# Patient Record
Sex: Male | Born: 1935 | Race: White | Hispanic: No | Marital: Married
Health system: Midwestern US, Academic
[De-identification: ages and names within clinical notes are randomized; demographics above are authoritative.]

## PROBLEM LIST (undated history)

## (undated) DIAGNOSIS — I4891 Unspecified atrial fibrillation: ICD-10-CM

---

## 2016-07-14 MED ORDER — AMIODARONE 200 MG PO TAB
ORAL_TABLET | ORAL | 3 refills | 42.00000 days | Status: DC
Start: 2016-07-14 — End: 2017-03-20

## 2016-11-14 LAB — BASIC METABOLIC PANEL
Lab: 1.1
Lab: 103
Lab: 110
Lab: 138
Lab: 14
Lab: 22
Lab: 26
Lab: 4.5
Lab: 58
Lab: 67
Lab: 9.6

## 2016-11-17 ENCOUNTER — Encounter: Admit: 2016-11-17 | Discharge: 2016-11-17 | Payer: MEDICARE

## 2016-11-17 MED ORDER — CARTIA XT 120 MG PO CP24
ORAL_CAPSULE | Freq: Every day | ORAL | 3 refills | 45.00000 days | Status: AC
Start: 2016-11-17 — End: 2017-03-20

## 2016-12-15 ENCOUNTER — Encounter: Admit: 2016-12-15 | Discharge: 2016-12-15 | Payer: MEDICARE

## 2016-12-15 DIAGNOSIS — I472 Ventricular tachycardia: ICD-10-CM

## 2016-12-15 DIAGNOSIS — Z79899 Other long term (current) drug therapy: ICD-10-CM

## 2016-12-15 DIAGNOSIS — I4891 Unspecified atrial fibrillation: Principal | ICD-10-CM

## 2016-12-15 NOTE — Progress Notes
Labs and/or CXR for amiodarone surveillance due per Amiodarone protocol.  Lab requisitions/orders and letter explaining need for testing mailed to patient or given to patient in clinic.

## 2017-01-05 ENCOUNTER — Encounter: Admit: 2017-01-05 | Discharge: 2017-01-05 | Payer: MEDICARE

## 2017-01-05 DIAGNOSIS — I472 Ventricular tachycardia: ICD-10-CM

## 2017-01-05 DIAGNOSIS — Z79899 Other long term (current) drug therapy: ICD-10-CM

## 2017-01-05 DIAGNOSIS — I4891 Unspecified atrial fibrillation: Principal | ICD-10-CM

## 2017-01-05 LAB — LIVER FUNCTION PANEL
Lab: 32
Lab: 7
Lab: 0.3
Lab: 0.6
Lab: 3.7
Lab: 30
Lab: 78

## 2017-01-05 LAB — TSH WITH FREE T4 REFLEX: Lab: 1.7

## 2017-01-05 NOTE — Progress Notes
CXR 01/02/17 at [facility] - There is mild pulmonary hyperinflation. There are degenerative changes of the thoracic spine with prominent bridging osteophytes. There is no acute appearing abnormality of the chest.

## 2017-01-05 NOTE — Progress Notes
Amiodarone Monitoring status as of 01/05/17:     Amiodarone monitoring complete.  Next amiodarone review is due in 180 days.    Most recent lab results  Lab Results   Component Value Date/Time    AST 30 01/02/2017    ALT 32 01/02/2017    TSH 1.72 01/02/2017    FREET4R 1.08 05/28/2015         Procedures  Last chest X-Ray: 10/5  Last PFT:   Last eye exam:

## 2017-01-07 ENCOUNTER — Encounter: Admit: 2017-01-07 | Discharge: 2017-01-07 | Payer: MEDICARE

## 2017-01-07 MED ORDER — ATORVASTATIN 20 MG PO TAB
ORAL_TABLET | Freq: Every day | 3 refills | Status: AC
Start: 2017-01-07 — End: 2018-06-30

## 2017-03-20 ENCOUNTER — Ambulatory Visit: Admit: 2017-03-20 | Discharge: 2017-03-21 | Payer: MEDICARE

## 2017-03-20 ENCOUNTER — Encounter: Admit: 2017-03-20 | Discharge: 2017-03-20 | Payer: MEDICARE

## 2017-03-20 DIAGNOSIS — I10 Essential (primary) hypertension: ICD-10-CM

## 2017-03-20 DIAGNOSIS — I358 Other nonrheumatic aortic valve disorders: ICD-10-CM

## 2017-03-20 DIAGNOSIS — I5030 Unspecified diastolic (congestive) heart failure: ICD-10-CM

## 2017-03-20 DIAGNOSIS — C4490 Unspecified malignant neoplasm of skin, unspecified: ICD-10-CM

## 2017-03-20 DIAGNOSIS — R600 Localized edema: ICD-10-CM

## 2017-03-20 DIAGNOSIS — I4891 Unspecified atrial fibrillation: Principal | ICD-10-CM

## 2017-03-20 DIAGNOSIS — E785 Hyperlipidemia, unspecified: ICD-10-CM

## 2017-03-20 DIAGNOSIS — I472 Ventricular tachycardia: ICD-10-CM

## 2017-03-20 DIAGNOSIS — E782 Mixed hyperlipidemia: ICD-10-CM

## 2017-03-20 DIAGNOSIS — R0989 Other specified symptoms and signs involving the circulatory and respiratory systems: ICD-10-CM

## 2017-03-20 DIAGNOSIS — I350 Nonrheumatic aortic (valve) stenosis: ICD-10-CM

## 2017-03-20 DIAGNOSIS — T887XXA Unspecified adverse effect of drug or medicament, initial encounter: ICD-10-CM

## 2017-03-20 DIAGNOSIS — J302 Other seasonal allergic rhinitis: ICD-10-CM

## 2017-03-20 DIAGNOSIS — R0600 Dyspnea, unspecified: ICD-10-CM

## 2017-03-20 MED ORDER — DILTIAZEM HCL 120 MG PO CP24
120 mg | ORAL_CAPSULE | Freq: Two times a day (BID) | ORAL | 3 refills | 45.00000 days | Status: AC
Start: 2017-03-20 — End: 2017-12-11

## 2017-03-20 MED ORDER — APIXABAN 5 MG PO TAB
5 mg | ORAL_TABLET | Freq: Two times a day (BID) | ORAL | 3 refills | Status: AC
Start: 2017-03-20 — End: 2017-06-23

## 2017-03-20 MED ORDER — FUROSEMIDE 20 MG PO TAB
20 mg | ORAL_TABLET | Freq: Every morning | ORAL | 3 refills | 90.00000 days | Status: AC
Start: 2017-03-20 — End: 2017-03-25

## 2017-03-25 ENCOUNTER — Encounter: Admit: 2017-03-25 | Discharge: 2017-03-25 | Payer: MEDICARE

## 2017-03-25 MED ORDER — FUROSEMIDE 20 MG PO TAB
20 mg | ORAL_TABLET | Freq: Every morning | ORAL | 3 refills | 90.00000 days | Status: AC
Start: 2017-03-25 — End: 2017-12-17

## 2017-03-27 ENCOUNTER — Encounter: Admit: 2017-03-27 | Discharge: 2017-03-27 | Payer: MEDICARE

## 2017-03-27 NOTE — Progress Notes
Medicare (Primary) no pre cert needed.

## 2017-04-03 ENCOUNTER — Encounter: Admit: 2017-04-03 | Discharge: 2017-04-03 | Payer: MEDICARE

## 2017-04-06 ENCOUNTER — Ambulatory Visit: Admit: 2017-04-06 | Discharge: 2017-04-06 | Payer: MEDICARE

## 2017-04-06 ENCOUNTER — Encounter: Admit: 2017-04-06 | Discharge: 2017-04-06 | Payer: MEDICARE

## 2017-04-06 ENCOUNTER — Encounter: Admit: 2017-04-06 | Discharge: 2017-04-07 | Payer: MEDICARE

## 2017-04-06 DIAGNOSIS — I4891 Unspecified atrial fibrillation: Principal | ICD-10-CM

## 2017-04-06 DIAGNOSIS — I5030 Unspecified diastolic (congestive) heart failure: ICD-10-CM

## 2017-04-06 DIAGNOSIS — Z5181 Encounter for therapeutic drug level monitoring: Principal | ICD-10-CM

## 2017-04-06 LAB — CBC
Lab: 10 FL (ref 7–11)
Lab: 13 g/dL — ABNORMAL LOW (ref 13.5–16.5)
Lab: 209 K/UL — ABNORMAL LOW (ref >60)
Lab: 29 pg — ABNORMAL HIGH (ref 26–34)
Lab: 32 g/dL (ref 32.0–36.0)
Lab: 4.4 M/UL (ref 4.4–5.5)
Lab: 40 % — ABNORMAL HIGH (ref 40–50)
Lab: 8.4 K/UL (ref 4.5–11.0)
Lab: 90 FL — ABNORMAL HIGH (ref 80–100)

## 2017-04-06 LAB — BASIC METABOLIC PANEL
Lab: 137 MMOL/L (ref 137–147)
Lab: 4.1 MMOL/L (ref 3.5–5.1)

## 2017-04-06 MED ORDER — PROPOFOL 10 MG/ML IV EMUL 20 ML (INFUSION)(AM)(OR)
INTRAVENOUS | 0 refills | Status: DC
Start: 2017-04-06 — End: 2017-04-06
  Administered 2017-04-06: 19:00:00 100 ug/kg/min via INTRAVENOUS

## 2017-04-06 MED ORDER — SODIUM CHLORIDE 0.9 % IV SOLP (OR) 500ML
0 refills | Status: DC
Start: 2017-04-06 — End: 2017-04-06
  Administered 2017-04-06: 19:00:00 via INTRAVENOUS

## 2017-04-06 MED ORDER — PROPOFOL INJ 10 MG/ML IV VIAL
0 refills | Status: DC
Start: 2017-04-06 — End: 2017-04-06
  Administered 2017-04-06: 19:00:00 50 mg via INTRAVENOUS
  Administered 2017-04-06 (×2): 20 mg via INTRAVENOUS

## 2017-04-06 MED ORDER — LIDOCAINE (PF) 200 MG/10 ML (2 %) IJ SYRG
0 refills | Status: DC
Start: 2017-04-06 — End: 2017-04-06
  Administered 2017-04-06: 19:00:00 60 mg via INTRAVENOUS

## 2017-04-30 ENCOUNTER — Encounter: Admit: 2017-04-30 | Discharge: 2017-04-30 | Payer: MEDICARE

## 2017-04-30 DIAGNOSIS — Z5181 Encounter for therapeutic drug level monitoring: Principal | ICD-10-CM

## 2017-04-30 DIAGNOSIS — I5030 Unspecified diastolic (congestive) heart failure: ICD-10-CM

## 2017-04-30 LAB — BASIC METABOLIC PANEL
Lab: 1.3 — ABNORMAL HIGH (ref 0.72–1.25)
Lab: 124 — ABNORMAL HIGH (ref 83–110)
Lab: 9.6
Lab: 102
Lab: 139
Lab: 14
Lab: 26 — ABNORMAL HIGH (ref 8.4–25.7)
Lab: 28
Lab: 4.5

## 2017-05-02 LAB — AMIODARONE LEVEL: Lab: 0.3 — ABNORMAL LOW (ref 1.0–2.5)

## 2017-05-05 ENCOUNTER — Ambulatory Visit: Admit: 2017-05-05 | Discharge: 2017-05-06 | Payer: MEDICARE

## 2017-05-05 ENCOUNTER — Encounter: Admit: 2017-05-05 | Discharge: 2017-05-05 | Payer: MEDICARE

## 2017-05-05 DIAGNOSIS — I358 Other nonrheumatic aortic valve disorders: ICD-10-CM

## 2017-05-05 DIAGNOSIS — I472 Ventricular tachycardia: ICD-10-CM

## 2017-05-05 DIAGNOSIS — C4490 Unspecified malignant neoplasm of skin, unspecified: ICD-10-CM

## 2017-05-05 DIAGNOSIS — I350 Nonrheumatic aortic (valve) stenosis: ICD-10-CM

## 2017-05-05 DIAGNOSIS — E785 Hyperlipidemia, unspecified: ICD-10-CM

## 2017-05-05 DIAGNOSIS — I10 Essential (primary) hypertension: Principal | ICD-10-CM

## 2017-05-05 DIAGNOSIS — J302 Other seasonal allergic rhinitis: ICD-10-CM

## 2017-05-05 DIAGNOSIS — R0989 Other specified symptoms and signs involving the circulatory and respiratory systems: ICD-10-CM

## 2017-05-05 DIAGNOSIS — R600 Localized edema: ICD-10-CM

## 2017-05-05 MED ORDER — CANDESARTAN-HYDROCHLOROTHIAZID 32-25 MG PO TAB
1 | ORAL_TABLET | Freq: Every day | ORAL | 3 refills | Status: AC
Start: 2017-05-05 — End: 2017-12-17

## 2017-05-05 MED ORDER — RIVAROXABAN 15 MG PO TAB
15 mg | ORAL_TABLET | Freq: Every day | ORAL | 11 refills | 30.00000 days | Status: AC
Start: 2017-05-05 — End: 2018-05-28

## 2017-05-07 ENCOUNTER — Encounter: Admit: 2017-05-07 | Discharge: 2017-05-07 | Payer: MEDICARE

## 2017-05-07 DIAGNOSIS — Z5181 Encounter for therapeutic drug level monitoring: Principal | ICD-10-CM

## 2017-05-07 DIAGNOSIS — I5030 Unspecified diastolic (congestive) heart failure: ICD-10-CM

## 2017-05-11 ENCOUNTER — Ambulatory Visit: Admit: 2017-05-11 | Discharge: 2017-05-12 | Payer: MEDICARE

## 2017-05-11 DIAGNOSIS — I10 Essential (primary) hypertension: Principal | ICD-10-CM

## 2017-05-11 DIAGNOSIS — I472 Ventricular tachycardia: ICD-10-CM

## 2017-05-11 DIAGNOSIS — I350 Nonrheumatic aortic (valve) stenosis: ICD-10-CM

## 2017-05-11 MED ORDER — NITROGLYCERIN 0.4 MG SL SUBL
.4 mg | SUBLINGUAL | 0 refills | Status: AC | PRN
Start: 2017-05-11 — End: ?

## 2017-05-11 MED ORDER — SODIUM CHLORIDE 0.9 % IV SOLP
250 mL | INTRAVENOUS | 0 refills | Status: AC | PRN
Start: 2017-05-11 — End: ?

## 2017-05-11 MED ORDER — REGADENOSON 0.4 MG/5 ML IV SYRG
.4 mg | Freq: Once | INTRAVENOUS | 0 refills | Status: CP
Start: 2017-05-11 — End: ?

## 2017-05-11 MED ORDER — AMINOPHYLLINE 500 MG/20 ML IV SOLN
50 mg | INTRAVENOUS | 0 refills | Status: AC | PRN
Start: 2017-05-11 — End: ?

## 2017-05-11 MED ORDER — ALBUTEROL SULFATE 90 MCG/ACTUATION IN HFAA
2 | RESPIRATORY_TRACT | 0 refills | Status: DC | PRN
Start: 2017-05-11 — End: 2017-05-16

## 2017-05-22 ENCOUNTER — Encounter: Admit: 2017-05-22 | Discharge: 2017-05-22 | Payer: MEDICARE

## 2017-06-08 ENCOUNTER — Ambulatory Visit: Admit: 2017-06-08 | Discharge: 2017-06-09 | Payer: MEDICARE

## 2017-06-08 DIAGNOSIS — I10 Essential (primary) hypertension: Principal | ICD-10-CM

## 2017-06-08 DIAGNOSIS — I358 Other nonrheumatic aortic valve disorders: ICD-10-CM

## 2017-06-08 DIAGNOSIS — I350 Nonrheumatic aortic (valve) stenosis: ICD-10-CM

## 2017-06-08 DIAGNOSIS — I472 Ventricular tachycardia: ICD-10-CM

## 2017-06-08 DIAGNOSIS — R0989 Other specified symptoms and signs involving the circulatory and respiratory systems: ICD-10-CM

## 2017-06-08 DIAGNOSIS — R600 Localized edema: ICD-10-CM

## 2017-06-08 DIAGNOSIS — J302 Other seasonal allergic rhinitis: ICD-10-CM

## 2017-06-08 DIAGNOSIS — C4490 Unspecified malignant neoplasm of skin, unspecified: ICD-10-CM

## 2017-06-09 ENCOUNTER — Encounter: Admit: 2017-06-09 | Discharge: 2017-06-09 | Payer: MEDICARE

## 2017-06-18 ENCOUNTER — Encounter: Admit: 2017-06-18 | Discharge: 2017-06-18 | Payer: MEDICARE

## 2017-06-23 ENCOUNTER — Encounter: Admit: 2017-06-23 | Discharge: 2017-06-23 | Payer: MEDICARE

## 2017-06-23 ENCOUNTER — Ambulatory Visit: Admit: 2017-06-23 | Discharge: 2017-06-24 | Payer: MEDICARE

## 2017-06-23 DIAGNOSIS — J302 Other seasonal allergic rhinitis: ICD-10-CM

## 2017-06-23 DIAGNOSIS — I358 Other nonrheumatic aortic valve disorders: ICD-10-CM

## 2017-06-23 DIAGNOSIS — R0989 Other specified symptoms and signs involving the circulatory and respiratory systems: ICD-10-CM

## 2017-06-23 DIAGNOSIS — E782 Mixed hyperlipidemia: ICD-10-CM

## 2017-06-23 DIAGNOSIS — I4891 Unspecified atrial fibrillation: ICD-10-CM

## 2017-06-23 DIAGNOSIS — I10 Essential (primary) hypertension: Principal | ICD-10-CM

## 2017-06-23 DIAGNOSIS — I350 Nonrheumatic aortic (valve) stenosis: Principal | ICD-10-CM

## 2017-06-23 DIAGNOSIS — I472 Ventricular tachycardia: ICD-10-CM

## 2017-06-23 DIAGNOSIS — C4490 Unspecified malignant neoplasm of skin, unspecified: ICD-10-CM

## 2017-06-23 DIAGNOSIS — I5030 Unspecified diastolic (congestive) heart failure: ICD-10-CM

## 2017-06-23 DIAGNOSIS — R0600 Dyspnea, unspecified: ICD-10-CM

## 2017-06-23 DIAGNOSIS — E785 Hyperlipidemia, unspecified: ICD-10-CM

## 2017-06-23 DIAGNOSIS — IMO0002 Overweight (BMI 25.0-29.9): ICD-10-CM

## 2017-08-03 ENCOUNTER — Ambulatory Visit: Admit: 2017-08-03 | Discharge: 2017-08-04 | Payer: MEDICARE

## 2017-08-03 DIAGNOSIS — I350 Nonrheumatic aortic (valve) stenosis: Principal | ICD-10-CM

## 2017-08-03 DIAGNOSIS — I5030 Unspecified diastolic (congestive) heart failure: ICD-10-CM

## 2017-08-03 DIAGNOSIS — I4891 Unspecified atrial fibrillation: ICD-10-CM

## 2017-08-13 ENCOUNTER — Encounter: Admit: 2017-08-13 | Discharge: 2017-08-13 | Payer: MEDICARE

## 2017-09-30 ENCOUNTER — Encounter: Admit: 2017-09-30 | Discharge: 2017-09-30 | Payer: MEDICARE

## 2017-09-30 DIAGNOSIS — I4891 Unspecified atrial fibrillation: ICD-10-CM

## 2017-09-30 DIAGNOSIS — Z79899 Other long term (current) drug therapy: ICD-10-CM

## 2017-09-30 DIAGNOSIS — E782 Mixed hyperlipidemia: Principal | ICD-10-CM

## 2017-12-02 ENCOUNTER — Encounter: Admit: 2017-12-02 | Discharge: 2017-12-02 | Payer: MEDICARE

## 2017-12-03 ENCOUNTER — Encounter: Admit: 2017-12-03 | Discharge: 2017-12-03 | Payer: MEDICARE

## 2017-12-11 ENCOUNTER — Encounter: Admit: 2017-12-11 | Discharge: 2017-12-11 | Payer: MEDICARE

## 2017-12-11 ENCOUNTER — Ambulatory Visit: Admit: 2017-12-11 | Discharge: 2017-12-11 | Payer: MEDICARE

## 2017-12-11 DIAGNOSIS — I472 Ventricular tachycardia: ICD-10-CM

## 2017-12-11 DIAGNOSIS — E782 Mixed hyperlipidemia: ICD-10-CM

## 2017-12-11 DIAGNOSIS — I358 Other nonrheumatic aortic valve disorders: ICD-10-CM

## 2017-12-11 DIAGNOSIS — C4490 Unspecified malignant neoplasm of skin, unspecified: ICD-10-CM

## 2017-12-11 DIAGNOSIS — E785 Hyperlipidemia, unspecified: ICD-10-CM

## 2017-12-11 DIAGNOSIS — R600 Localized edema: ICD-10-CM

## 2017-12-11 DIAGNOSIS — R002 Palpitations: ICD-10-CM

## 2017-12-11 DIAGNOSIS — IMO0002 Overweight (BMI 25.0-29.9): ICD-10-CM

## 2017-12-11 DIAGNOSIS — J302 Other seasonal allergic rhinitis: ICD-10-CM

## 2017-12-11 DIAGNOSIS — I10 Essential (primary) hypertension: ICD-10-CM

## 2017-12-11 DIAGNOSIS — T887XXA Unspecified adverse effect of drug or medicament, initial encounter: ICD-10-CM

## 2017-12-11 DIAGNOSIS — R0989 Other specified symptoms and signs involving the circulatory and respiratory systems: ICD-10-CM

## 2017-12-11 DIAGNOSIS — I5030 Unspecified diastolic (congestive) heart failure: ICD-10-CM

## 2017-12-11 DIAGNOSIS — I4891 Unspecified atrial fibrillation: Principal | ICD-10-CM

## 2017-12-11 DIAGNOSIS — I350 Nonrheumatic aortic (valve) stenosis: ICD-10-CM

## 2017-12-11 DIAGNOSIS — R0600 Dyspnea, unspecified: ICD-10-CM

## 2017-12-11 MED ORDER — CARVEDILOL 25 MG PO TAB
25 mg | ORAL_TABLET | Freq: Two times a day (BID) | ORAL | 11 refills | 90.00000 days | Status: AC
Start: 2017-12-11 — End: 2017-12-17

## 2017-12-12 ENCOUNTER — Inpatient Hospital Stay: Admit: 2017-12-12 | Discharge: 2017-12-12 | Payer: MEDICARE

## 2017-12-12 ENCOUNTER — Encounter: Admit: 2017-12-12 | Discharge: 2017-12-12 | Payer: MEDICARE

## 2017-12-12 DIAGNOSIS — R600 Localized edema: ICD-10-CM

## 2017-12-12 DIAGNOSIS — C4490 Unspecified malignant neoplasm of skin, unspecified: ICD-10-CM

## 2017-12-12 DIAGNOSIS — N184 Chronic kidney disease, stage 4 (severe): Secondary | ICD-10-CM

## 2017-12-12 DIAGNOSIS — I5030 Unspecified diastolic (congestive) heart failure: ICD-10-CM

## 2017-12-12 DIAGNOSIS — I10 Essential (primary) hypertension: Principal | ICD-10-CM

## 2017-12-12 DIAGNOSIS — R001 Bradycardia, unspecified: ICD-10-CM

## 2017-12-12 DIAGNOSIS — T887XXA Unspecified adverse effect of drug or medicament, initial encounter: ICD-10-CM

## 2017-12-12 DIAGNOSIS — J302 Other seasonal allergic rhinitis: ICD-10-CM

## 2017-12-12 DIAGNOSIS — I358 Other nonrheumatic aortic valve disorders: ICD-10-CM

## 2017-12-12 DIAGNOSIS — E785 Hyperlipidemia, unspecified: ICD-10-CM

## 2017-12-12 DIAGNOSIS — I4819 Other persistent atrial fibrillation: ICD-10-CM

## 2017-12-12 LAB — COMPREHENSIVE METABOLIC PANEL
Lab: 1.4 mg/dL — ABNORMAL HIGH (ref 0.3–1.2)
Lab: 112 U/L — ABNORMAL HIGH (ref 25–110)
Lab: 134 MMOL/L — ABNORMAL LOW (ref 137–147)
Lab: 135 U/L — ABNORMAL HIGH (ref 7–40)
Lab: 19 MMOL/L — ABNORMAL LOW (ref 21–30)
Lab: 2.3 mg/dL — ABNORMAL HIGH (ref 0.4–1.24)
Lab: 28 mL/min — ABNORMAL LOW (ref >60)
Lab: 33 mL/min — ABNORMAL LOW (ref >60)
Lab: 4 g/dL — ABNORMAL HIGH (ref 3.5–5.0)
Lab: 6.5 g/dL (ref 6.0–8.0)
Lab: 75 U/L — ABNORMAL HIGH (ref 7–56)
Lab: 8.3 mg/dL — ABNORMAL LOW (ref 8.5–10.6)
Lab: 9 K/UL — ABNORMAL HIGH (ref 3–12)

## 2017-12-12 LAB — CBC AND DIFF
Lab: 0 10*3/uL (ref 0–0.20)
Lab: 0 10*3/uL (ref 0–0.45)
Lab: 9.9 10*3/uL (ref 4.5–11.0)

## 2017-12-12 LAB — URINALYSIS MICROSCOPIC REFLEX TO CULTURE

## 2017-12-12 LAB — URINALYSIS DIPSTICK REFLEX TO CULTURE
Lab: NEGATIVE
Lab: NEGATIVE

## 2017-12-12 LAB — TROPONIN-I: Lab: 0 ng/mL — ABNORMAL HIGH (ref 0.0–0.05)

## 2017-12-12 LAB — TSH WITH FREE T4 REFLEX: Lab: 2.7 uU/mL — ABNORMAL HIGH (ref 0.35–5.00)

## 2017-12-12 LAB — MAGNESIUM: Lab: 1.7 mg/dL — ABNORMAL HIGH (ref 1.6–2.6)

## 2017-12-12 LAB — BNP (B-TYPE NATRIURETIC PEPTI): Lab: 435 pg/mL — ABNORMAL HIGH (ref 0–100)

## 2017-12-12 MED ORDER — DOPAMINE IN 5 % DEXTROSE 400 MG/250 ML (1,600 MCG/ML) IV SOLN
1-20 ug/kg/min | INTRAVENOUS | 0 refills | Status: DC
Start: 2017-12-12 — End: 2017-12-13
  Administered 2017-12-12: 22:00:00 20 ug/kg/min via INTRAVENOUS

## 2017-12-12 MED ORDER — IPRATROPIUM BROMIDE 0.02 % IN SOLN
0.5 mg | RESPIRATORY_TRACT | 0 refills | Status: DC | PRN
Start: 2017-12-12 — End: 2017-12-12

## 2017-12-12 MED ORDER — POLYETHYLENE GLYCOL 3350 17 GRAM PO PWPK
2 | Freq: Every day | ORAL | 0 refills | Status: DC
Start: 2017-12-12 — End: 2017-12-13
  Administered 2017-12-13: 05:00:00 34 g via ORAL

## 2017-12-12 MED ORDER — SIMETHICONE 80 MG PO CHEW
80 mg | ORAL | 0 refills | Status: DC | PRN
Start: 2017-12-12 — End: 2017-12-18
  Administered 2017-12-15: 13:00:00 80 mg via ORAL

## 2017-12-12 MED ORDER — IPRATROPIUM BROMIDE 0.02 % IN SOLN
0.5 mg | RESPIRATORY_TRACT | 0 refills | Status: DC | PRN
Start: 2017-12-12 — End: 2017-12-15

## 2017-12-12 MED ORDER — SENNOSIDES-DOCUSATE SODIUM 8.6-50 MG PO TAB
1 | Freq: Two times a day (BID) | ORAL | 0 refills | Status: DC
Start: 2017-12-12 — End: 2017-12-18
  Administered 2017-12-13 – 2017-12-17 (×3): 1 via ORAL

## 2017-12-12 MED ORDER — ALBUTEROL SULFATE 2.5 MG/0.5 ML IN NEBU
2.5 mg | RESPIRATORY_TRACT | 0 refills | Status: DC | PRN
Start: 2017-12-12 — End: 2017-12-15

## 2017-12-12 MED ORDER — SODIUM POLYSTYRENE SULFONATE 15 GRAM/60 ML PO SUSP
30 g | Freq: Once | ORAL | 0 refills | Status: CP
Start: 2017-12-12 — End: ?
  Administered 2017-12-12: 30 g via ORAL

## 2017-12-12 MED ORDER — NOREPINEPHRINE IV DRIP (STD CONC)
.01-.3 ug/kg/min | INTRAVENOUS | 0 refills | Status: DC
Start: 2017-12-12 — End: 2017-12-13

## 2017-12-12 MED ORDER — CALCIUM CHLORIDE 100 MG/ML (10 %) IV SYRG
2 g | Freq: Once | INTRAVENOUS | 0 refills | Status: CP
Start: 2017-12-12 — End: ?
  Administered 2017-12-12: 2 g via INTRAVENOUS

## 2017-12-12 MED ORDER — INSULIN REGULAR HUMAN(#) 1 UNIT/ML IJ SYRINGE
10 [IU] | Freq: Once | INTRAVENOUS | 0 refills | Status: DC
Start: 2017-12-12 — End: 2017-12-12

## 2017-12-12 MED ORDER — INSULIN REGULAR HUMAN(#) 1 UNIT/ML IJ SYRINGE
10 [IU] | Freq: Once | INTRAVENOUS | 0 refills | Status: CP
Start: 2017-12-12 — End: ?
  Administered 2017-12-12: 10 [IU] via INTRAVENOUS

## 2017-12-12 MED ORDER — FUROSEMIDE 10 MG/ML IJ SOLN
40 mg | Freq: Once | INTRAVENOUS | 0 refills | Status: CP
Start: 2017-12-12 — End: ?
  Administered 2017-12-12: 40 mg via INTRAVENOUS

## 2017-12-12 MED ORDER — DEXTROSE 50 % IN WATER (D50W) IV SOLP
50 mL | Freq: Once | INTRAVENOUS | 0 refills | Status: CP
Start: 2017-12-12 — End: ?
  Administered 2017-12-12: 50 mL via INTRAVENOUS

## 2017-12-12 MED ORDER — SODIUM BICARBONATE 1 MEQ/ML (8.4 %) IV SOLN
50 meq | Freq: Once | INTRAVENOUS | 0 refills | Status: DC
Start: 2017-12-12 — End: 2017-12-12

## 2017-12-12 MED ORDER — BISACODYL 10 MG RE SUPP
10 mg | Freq: Once | RECTAL | 0 refills | Status: CP
Start: 2017-12-12 — End: ?
  Administered 2017-12-13: 05:00:00 10 mg via RECTAL

## 2017-12-12 MED ORDER — MAGNESIUM SULFATE IN D5W 1 GRAM/100 ML IV PGBK
1 g | INTRAVENOUS | 0 refills | Status: CP
Start: 2017-12-12 — End: ?
  Administered 2017-12-12 – 2017-12-13 (×2): 1 g via INTRAVENOUS

## 2017-12-12 MED ORDER — ATORVASTATIN 20 MG PO TAB
20 mg | Freq: Every day | ORAL | 0 refills | Status: DC
Start: 2017-12-12 — End: 2017-12-18
  Administered 2017-12-13 – 2017-12-17 (×5): 20 mg via ORAL

## 2017-12-12 MED ORDER — SODIUM BICARBONATE 1 MEQ/ML (8.4 %) IV SOLN
50 meq | Freq: Once | INTRAVENOUS | 0 refills | Status: CP
Start: 2017-12-12 — End: ?
  Administered 2017-12-12: 50 meq via INTRAVENOUS

## 2017-12-12 MED ORDER — MELATONIN 5 MG PO TAB
5 mg | Freq: Every evening | ORAL | 0 refills | Status: DC | PRN
Start: 2017-12-12 — End: 2017-12-18
  Administered 2017-12-13 – 2017-12-17 (×2): 5 mg via ORAL

## 2017-12-12 MED ORDER — CALCIUM GLUCONATE 1GM/100ML NS MB+
1 g | Freq: Once | INTRAVENOUS | 0 refills | Status: DC
Start: 2017-12-12 — End: 2017-12-12

## 2017-12-12 MED ORDER — ALBUTEROL SULFATE 2.5 MG/0.5 ML IN NEBU
2.5 mg | RESPIRATORY_TRACT | 0 refills | Status: DC | PRN
Start: 2017-12-12 — End: 2017-12-12

## 2017-12-12 MED ORDER — DEXTROSE 50 % IN WATER (D50W) IV SOLP
25 g | Freq: Once | INTRAVENOUS | 0 refills | Status: DC
Start: 2017-12-12 — End: 2017-12-12

## 2017-12-12 MED ORDER — ALBUTEROL SULFATE 90 MCG/ACTUATION IN HFAA
2 | RESPIRATORY_TRACT | 0 refills | Status: DC | PRN
Start: 2017-12-12 — End: 2017-12-18

## 2017-12-12 MED ORDER — FLUTICASONE PROPION-SALMETEROL 100-50 MCG/DOSE IN DSDV
1 | Freq: Two times a day (BID) | RESPIRATORY_TRACT | 0 refills | Status: DC
Start: 2017-12-12 — End: 2017-12-18
  Administered 2017-12-13: 11:00:00 1 via RESPIRATORY_TRACT

## 2017-12-13 DIAGNOSIS — I495 Sick sinus syndrome: Principal | ICD-10-CM

## 2017-12-13 LAB — BASIC METABOLIC PANEL
Lab: 10 mg/dL — ABNORMAL HIGH (ref 8.5–10.6)
Lab: 135 MMOL/L — ABNORMAL LOW (ref 137–147)
Lab: 2.3 mg/dL — ABNORMAL HIGH (ref 0.4–1.24)
Lab: 210 mg/dL — ABNORMAL HIGH (ref 70–100)
Lab: 23 MMOL/L (ref 21–30)
Lab: 26 mL/min — ABNORMAL LOW (ref >60)
Lab: 32 mL/min — ABNORMAL LOW (ref >60)
Lab: 40 mg/dL — ABNORMAL HIGH (ref 7–25)
Lab: 103 MMOL/L (ref 98–110)
Lab: 135 MMOL/L — ABNORMAL LOW (ref 137–147)
Lab: 2.3 mg/dL — ABNORMAL HIGH (ref 0.4–1.24)
Lab: 214 mg/dL — ABNORMAL HIGH (ref 70–100)
Lab: 24 MMOL/L (ref 21–30)
Lab: 27 mL/min — ABNORMAL LOW (ref >60)
Lab: 33 mL/min — ABNORMAL LOW (ref >60)
Lab: 39 mg/dL — ABNORMAL HIGH (ref 7–25)
Lab: 4.5 MMOL/L (ref 3.5–5.1)
Lab: 8 (ref 3–12)
Lab: 9.9 mg/dL (ref 8.5–10.6)

## 2017-12-13 LAB — PROCALCITONIN: Lab: 0 ng/mL (ref <0.11)

## 2017-12-13 LAB — MAGNESIUM: Lab: 1.8 mg/dL — ABNORMAL HIGH (ref 1.6–2.6)

## 2017-12-13 LAB — CREATININE-URINE RANDOM: Lab: 104 mg/dL

## 2017-12-13 LAB — PTT (APTT)
Lab: 29 s (ref 24.0–36.5)
Lab: 174 s — ABNORMAL HIGH (ref 24.0–36.5)
Lab: 65 s — ABNORMAL HIGH (ref 24.0–36.5)

## 2017-12-13 LAB — CBC AND DIFF: Lab: 13 K/UL — ABNORMAL HIGH (ref 4.5–11.0)

## 2017-12-13 LAB — POC GLUCOSE: Lab: 169 mg/dL — ABNORMAL HIGH (ref 70–100)

## 2017-12-13 LAB — TROPONIN-I
Lab: 0 ng/mL — ABNORMAL HIGH (ref 0.0–0.05)
Lab: 0.1 ng/mL — ABNORMAL HIGH (ref >60)
Lab: 0.1 ng/mL — ABNORMAL HIGH (ref 0.0–0.05)
Lab: 0.1 ng/mL — ABNORMAL HIGH (ref 0.0–0.05)

## 2017-12-13 LAB — UREA NITROGEN-URINE RANDOM: Lab: 629 mg/dL

## 2017-12-13 LAB — SODIUM-URINE RANDOM: Lab: 36 MMOL/L

## 2017-12-13 LAB — COMPREHENSIVE METABOLIC PANEL: Lab: 134 MMOL/L — ABNORMAL LOW (ref 137–147)

## 2017-12-13 LAB — LACTIC ACID(LACTATE): Lab: 1.2 MMOL/L (ref 0.5–2.0)

## 2017-12-13 MED ORDER — POTASSIUM CHLORIDE 20 MEQ PO TBTQ
40 meq | Freq: Once | ORAL | 0 refills | Status: DC
Start: 2017-12-13 — End: 2017-12-14

## 2017-12-13 MED ORDER — MAGNESIUM SULFATE IN D5W 1 GRAM/100 ML IV PGBK
1 g | INTRAVENOUS | 0 refills | Status: CP
Start: 2017-12-13 — End: ?
  Administered 2017-12-13 (×2): 1 g via INTRAVENOUS

## 2017-12-13 MED ORDER — HEPARIN (PORCINE) IN 5 % DEX 20,000 UNIT/500 ML (40 UNIT/ML) IV SOLP
0-2000 [IU]/h | INTRAVENOUS | 0 refills | Status: AC
Start: 2017-12-13 — End: ?
  Administered 2017-12-13: 10:00:00 1269 [IU]/h via INTRAVENOUS
  Administered 2017-12-14: 03:00:00 1069 [IU]/h via INTRAVENOUS

## 2017-12-13 MED ORDER — POTASSIUM CHLORIDE 20 MEQ PO TBTQ
20 meq | Freq: Once | ORAL | 0 refills | Status: CP
Start: 2017-12-13 — End: ?
  Administered 2017-12-14: 03:00:00 20 meq via ORAL

## 2017-12-13 MED ORDER — POLYETHYLENE GLYCOL 3350 17 GRAM PO PWPK
1 | Freq: Every day | ORAL | 0 refills | Status: DC | PRN
Start: 2017-12-13 — End: 2017-12-18

## 2017-12-13 MED ORDER — HEPARIN (PORCINE) BOLUS FOR CONTINUOUS INF (VIAL)
70 [IU]/kg | Freq: Once | INTRAVENOUS | 0 refills | Status: CP
Start: 2017-12-13 — End: ?
  Administered 2017-12-13: 10:00:00 5920 [IU] via INTRAVENOUS

## 2017-12-13 MED ORDER — ASPIRIN 81 MG PO TBEC
81 mg | Freq: Every day | ORAL | 0 refills | Status: DC
Start: 2017-12-13 — End: 2017-12-18
  Administered 2017-12-13 – 2017-12-17 (×5): 81 mg via ORAL

## 2017-12-14 ENCOUNTER — Encounter: Admit: 2017-12-14 | Discharge: 2017-12-14 | Payer: MEDICARE

## 2017-12-14 ENCOUNTER — Inpatient Hospital Stay: Admit: 2017-12-14 | Discharge: 2017-12-14 | Payer: MEDICARE

## 2017-12-14 DIAGNOSIS — I10 Essential (primary) hypertension: Principal | ICD-10-CM

## 2017-12-14 DIAGNOSIS — I358 Other nonrheumatic aortic valve disorders: ICD-10-CM

## 2017-12-14 DIAGNOSIS — E785 Hyperlipidemia, unspecified: ICD-10-CM

## 2017-12-14 DIAGNOSIS — J302 Other seasonal allergic rhinitis: ICD-10-CM

## 2017-12-14 DIAGNOSIS — I495 Sick sinus syndrome: Principal | ICD-10-CM

## 2017-12-14 DIAGNOSIS — C4490 Unspecified malignant neoplasm of skin, unspecified: ICD-10-CM

## 2017-12-14 LAB — MAGNESIUM
Lab: 2 mg/dL (ref 1.6–2.6)
Lab: 2 mg/dL — ABNORMAL HIGH (ref >60)

## 2017-12-14 LAB — PTT (APTT)
Lab: 66 s — ABNORMAL HIGH (ref 24.0–36.5)
Lab: 65 s — ABNORMAL HIGH (ref 24.0–36.5)

## 2017-12-14 LAB — BASIC METABOLIC PANEL: Lab: 134 MMOL/L — ABNORMAL LOW (ref 137–147)

## 2017-12-14 LAB — URINALYSIS, MICROSCOPIC

## 2017-12-14 LAB — CULTURE-URINE W/SENSITIVITY

## 2017-12-14 LAB — POTASSIUM: Lab: 3.9 MMOL/L (ref 3.5–5.1)

## 2017-12-14 LAB — URINALYSIS DIPSTICK

## 2017-12-14 LAB — CBC: Lab: 18 10*3/uL — ABNORMAL HIGH (ref 4.5–11.0)

## 2017-12-14 MED ORDER — RIVAROXABAN 15 MG PO TAB
15 mg | Freq: Every day | ORAL | 0 refills | Status: DC
Start: 2017-12-14 — End: 2017-12-18
  Administered 2017-12-14 – 2017-12-17 (×4): 15 mg via ORAL

## 2017-12-14 MED ORDER — DILTIAZEM HCL 120 MG PO CP24
120 mg | Freq: Every day | ORAL | 0 refills | Status: DC
Start: 2017-12-14 — End: 2017-12-18
  Administered 2017-12-14 – 2017-12-17 (×4): 120 mg via ORAL

## 2017-12-15 DIAGNOSIS — I495 Sick sinus syndrome: Principal | ICD-10-CM

## 2017-12-15 LAB — CBC: Lab: 12 K/UL — ABNORMAL HIGH (ref >60)

## 2017-12-15 LAB — MAGNESIUM: Lab: 1.6 mg/dL — ABNORMAL LOW (ref 1.6–2.6)

## 2017-12-15 LAB — BASIC METABOLIC PANEL: Lab: 137 MMOL/L — ABNORMAL LOW (ref 137–147)

## 2017-12-15 MED ORDER — FUROSEMIDE 10 MG/ML IJ SOLN
40 mg | Freq: Once | INTRAVENOUS | 0 refills | Status: CP
Start: 2017-12-15 — End: ?
  Administered 2017-12-15: 18:00:00 40 mg via INTRAVENOUS

## 2017-12-15 MED ORDER — MAGNESIUM SULFATE IN D5W 1 GRAM/100 ML IV PGBK
1 g | INTRAVENOUS | 0 refills | Status: CP
Start: 2017-12-15 — End: ?
  Administered 2017-12-15 (×2): 1 g via INTRAVENOUS

## 2017-12-15 MED ORDER — POTASSIUM CHLORIDE 20 MEQ PO TBTQ
40 meq | Freq: Once | ORAL | 0 refills | Status: CP
Start: 2017-12-15 — End: ?
  Administered 2017-12-15: 13:00:00 40 meq via ORAL

## 2017-12-15 MED ORDER — POTASSIUM CHLORIDE 20 MEQ PO TBTQ
40 meq | Freq: Once | ORAL | 0 refills | Status: CP
Start: 2017-12-15 — End: ?
  Administered 2017-12-15: 08:00:00 40 meq via ORAL

## 2017-12-16 ENCOUNTER — Inpatient Hospital Stay: Admit: 2017-12-16 | Discharge: 2017-12-16 | Payer: MEDICARE

## 2017-12-16 ENCOUNTER — Encounter: Admit: 2017-12-16 | Discharge: 2017-12-16 | Payer: MEDICARE

## 2017-12-16 LAB — BASIC METABOLIC PANEL: Lab: 138 MMOL/L — ABNORMAL LOW (ref >60)

## 2017-12-16 LAB — MAGNESIUM: Lab: 1.9 mg/dL — ABNORMAL LOW (ref 1.6–2.6)

## 2017-12-16 LAB — CBC: Lab: 8.7 K/UL — ABNORMAL HIGH (ref >60)

## 2017-12-16 MED ORDER — AMIODARONE 200 MG PO TAB
400 mg | Freq: Every day | ORAL | 0 refills | Status: DC
Start: 2017-12-16 — End: 2017-12-18

## 2017-12-16 MED ORDER — PROPOFOL 10 MG/ML IV EMUL 20 ML (INFUSION)(AM)(OR)
INTRAVENOUS | 0 refills | Status: DC
Start: 2017-12-16 — End: 2017-12-16
  Administered 2017-12-16: 14:00:00 125 ug/kg/min via INTRAVENOUS

## 2017-12-16 MED ORDER — LIDOCAINE (PF) 200 MG/10 ML (2 %) IJ SYRG
0 refills | Status: DC
Start: 2017-12-16 — End: 2017-12-16
  Administered 2017-12-16: 14:00:00 50 mg via INTRAVENOUS

## 2017-12-16 MED ORDER — FUROSEMIDE 10 MG/ML IJ SOLN
40 mg | Freq: Once | INTRAVENOUS | 0 refills | Status: CP
Start: 2017-12-16 — End: ?
  Administered 2017-12-16: 20:00:00 40 mg via INTRAVENOUS

## 2017-12-16 MED ORDER — AMIODARONE 200 MG PO TAB
400 mg | Freq: Two times a day (BID) | ORAL | 0 refills | Status: DC
Start: 2017-12-16 — End: 2017-12-18
  Administered 2017-12-16 – 2017-12-17 (×3): 400 mg via ORAL

## 2017-12-16 MED ORDER — SODIUM CHLORIDE 0.9 % IV SOLP (OR) 500ML
0 refills | Status: DC
Start: 2017-12-16 — End: 2017-12-16
  Administered 2017-12-16: 14:00:00 via INTRAVENOUS

## 2017-12-16 MED ORDER — BISOPROLOL FUMARATE 5 MG PO TAB
2.5 mg | Freq: Every day | ORAL | 0 refills | Status: DC
Start: 2017-12-16 — End: 2017-12-16

## 2017-12-16 MED ORDER — FENTANYL CITRATE (PF) 50 MCG/ML IJ SOLN
0 refills | Status: DC
Start: 2017-12-16 — End: 2017-12-16
  Administered 2017-12-16: 14:00:00 25 ug via INTRAVENOUS

## 2017-12-16 MED ORDER — PROPOFOL INJ 10 MG/ML IV VIAL
0 refills | Status: DC
Start: 2017-12-16 — End: 2017-12-16
  Administered 2017-12-16: 14:00:00 10 mg via INTRAVENOUS
  Administered 2017-12-16: 14:00:00 40 mg via INTRAVENOUS
  Administered 2017-12-16: 14:00:00 10 mg via INTRAVENOUS

## 2017-12-17 ENCOUNTER — Inpatient Hospital Stay: Admit: 2017-12-12 | Discharge: 2017-12-12 | Payer: MEDICARE

## 2017-12-17 ENCOUNTER — Inpatient Hospital Stay: Admit: 2017-12-16 | Discharge: 2017-12-16 | Payer: MEDICARE

## 2017-12-17 ENCOUNTER — Inpatient Hospital Stay: Admit: 2017-12-15 | Discharge: 2017-12-15 | Payer: MEDICARE

## 2017-12-17 ENCOUNTER — Inpatient Hospital Stay
Admit: 2017-12-12 | Discharge: 2017-12-17 | Disposition: A | Discharge status: Home / Self Care | Payer: MEDICARE | Source: Other Acute Inpatient Hospital | Attending: Cardiovascular Disease | Admitting: Cardiovascular Disease

## 2017-12-17 ENCOUNTER — Inpatient Hospital Stay: Admit: 2017-12-13 | Discharge: 2017-12-13 | Payer: MEDICARE

## 2017-12-17 DIAGNOSIS — I495 Sick sinus syndrome: Principal | ICD-10-CM

## 2017-12-17 DIAGNOSIS — I472 Ventricular tachycardia: ICD-10-CM

## 2017-12-17 DIAGNOSIS — E875 Hyperkalemia: ICD-10-CM

## 2017-12-17 DIAGNOSIS — I130 Hypertensive heart and chronic kidney disease with heart failure and stage 1 through stage 4 chronic kidney disease, or unspecified chronic kidney disease: ICD-10-CM

## 2017-12-17 DIAGNOSIS — J45909 Unspecified asthma, uncomplicated: ICD-10-CM

## 2017-12-17 DIAGNOSIS — Z79899 Other long term (current) drug therapy: ICD-10-CM

## 2017-12-17 DIAGNOSIS — E785 Hyperlipidemia, unspecified: ICD-10-CM

## 2017-12-17 DIAGNOSIS — N179 Acute kidney failure, unspecified: ICD-10-CM

## 2017-12-17 DIAGNOSIS — R570 Cardiogenic shock: ICD-10-CM

## 2017-12-17 DIAGNOSIS — I350 Nonrheumatic aortic (valve) stenosis: ICD-10-CM

## 2017-12-17 DIAGNOSIS — R42 Dizziness and giddiness: ICD-10-CM

## 2017-12-17 DIAGNOSIS — Z7901 Long term (current) use of anticoagulants: ICD-10-CM

## 2017-12-17 DIAGNOSIS — I481 Persistent atrial fibrillation: ICD-10-CM

## 2017-12-17 DIAGNOSIS — N170 Acute kidney failure with tubular necrosis: ICD-10-CM

## 2017-12-17 DIAGNOSIS — J9601 Acute respiratory failure with hypoxia: ICD-10-CM

## 2017-12-17 DIAGNOSIS — I5043 Acute on chronic combined systolic (congestive) and diastolic (congestive) heart failure: ICD-10-CM

## 2017-12-17 DIAGNOSIS — Z85828 Personal history of other malignant neoplasm of skin: ICD-10-CM

## 2017-12-17 DIAGNOSIS — E872 Acidosis: ICD-10-CM

## 2017-12-17 DIAGNOSIS — N184 Chronic kidney disease, stage 4 (severe): ICD-10-CM

## 2017-12-17 DIAGNOSIS — I2510 Atherosclerotic heart disease of native coronary artery without angina pectoris: ICD-10-CM

## 2017-12-17 LAB — BASIC METABOLIC PANEL
Lab: 138 MMOL/L — ABNORMAL LOW (ref >60)
Lab: 1.5 mg/dL — ABNORMAL HIGH (ref >60)
Lab: 100 MMOL/L — ABNORMAL LOW (ref 98–110)
Lab: 138 MMOL/L — ABNORMAL LOW (ref 137–147)
Lab: 164 mg/dL — ABNORMAL HIGH (ref 70–100)
Lab: 31 MMOL/L — ABNORMAL HIGH (ref 21–30)
Lab: 33 mg/dL — ABNORMAL HIGH (ref 7–25)
Lab: 4 MMOL/L — ABNORMAL HIGH (ref 3.5–5.1)
Lab: 44 mL/min — ABNORMAL LOW (ref >60)
Lab: 54 mL/min — ABNORMAL LOW (ref >60)
Lab: 7 mg/dL — ABNORMAL HIGH (ref 3–12)
Lab: 9.5 mg/dL — ABNORMAL LOW (ref >60)

## 2017-12-17 LAB — MAGNESIUM: Lab: 1.7 mg/dL — ABNORMAL LOW (ref 1.6–2.6)

## 2017-12-17 LAB — CBC: Lab: 8 10*3/uL — ABNORMAL HIGH (ref >60)

## 2017-12-17 MED ORDER — MELATONIN 5 MG PO TAB
5 mg | Freq: Every evening | ORAL | 0 refills | 28.00000 days | Status: AC | PRN
Start: 2017-12-17 — End: 2019-05-30

## 2017-12-17 MED ORDER — POTASSIUM CHLORIDE 20 MEQ PO TBTQ
40 meq | ORAL_TABLET | Freq: Every day | ORAL | 0 refills | 30.00000 days | Status: AC
Start: 2017-12-17 — End: 2017-12-17

## 2017-12-17 MED ORDER — AMIODARONE 400 MG PO TAB
400 mg | ORAL_TABLET | Freq: Two times a day (BID) | ORAL | 3 refills | 42.00000 days | Status: AC
Start: 2017-12-17 — End: 2017-12-17

## 2017-12-17 MED ORDER — FUROSEMIDE 40 MG PO TAB
ORAL_TABLET | Freq: Every day | ORAL | 3 refills | 90.00000 days | Status: AC
Start: 2017-12-17 — End: 2018-03-16

## 2017-12-17 MED ORDER — POTASSIUM CHLORIDE 20 MEQ PO TBTQ
ORAL_TABLET | Freq: Every day | 3 refills | 30.00000 days | Status: AC
Start: 2017-12-17 — End: 2019-08-18

## 2017-12-17 MED ORDER — VALSARTAN 160 MG PO TAB
160 mg | Freq: Every day | ORAL | 0 refills | Status: DC
Start: 2017-12-17 — End: 2017-12-18
  Administered 2017-12-17: 14:00:00 160 mg via ORAL

## 2017-12-17 MED ORDER — CANDESARTAN 32 MG PO TAB
32 mg | Freq: Every day | ORAL | 0 refills | Status: AC
Start: 2017-12-17 — End: 2017-12-17

## 2017-12-17 MED ORDER — AMIODARONE 200 MG PO TAB
ORAL_TABLET | Freq: Two times a day (BID) | ORAL | 1 refills | 42.00000 days | Status: AC
Start: 2017-12-17 — End: 2018-02-11

## 2017-12-17 MED ORDER — DILTIAZEM HCL 120 MG PO CP24
120 mg | ORAL_CAPSULE | Freq: Every day | ORAL | 0 refills | 45.00000 days | Status: AC
Start: 2017-12-17 — End: 2018-02-11

## 2017-12-17 MED ORDER — FUROSEMIDE 40 MG PO TAB
40 mg | ORAL_TABLET | Freq: Every morning | ORAL | 0 refills | 90.00000 days | Status: AC
Start: 2017-12-17 — End: 2017-12-17

## 2017-12-17 MED ORDER — AMIODARONE 400 MG PO TAB
400 mg | ORAL_TABLET | Freq: Every day | ORAL | 3 refills | Status: CN
Start: 2017-12-17 — End: ?

## 2017-12-17 MED ORDER — FUROSEMIDE 80 MG PO TAB
80 mg | ORAL_TABLET | Freq: Every morning | ORAL | 0 refills | 90.00000 days | Status: AC
Start: 2017-12-17 — End: 2017-12-17

## 2017-12-17 MED ORDER — MAGNESIUM SULFATE IN D5W 1 GRAM/100 ML IV PGBK
1 g | INTRAVENOUS | 0 refills | Status: CP
Start: 2017-12-17 — End: ?
  Administered 2017-12-17 (×2): 1 g via INTRAVENOUS

## 2017-12-17 MED ORDER — FUROSEMIDE 10 MG/ML IJ SOLN
40 mg | Freq: Once | INTRAVENOUS | 0 refills | Status: CP
Start: 2017-12-17 — End: ?
  Administered 2017-12-17: 15:00:00 40 mg via INTRAVENOUS

## 2017-12-17 MED ORDER — CANDESARTAN 32 MG PO TAB
32 mg | ORAL_TABLET | Freq: Every day | ORAL | 3 refills | Status: AC
Start: 2017-12-17 — End: 2018-02-11

## 2017-12-18 LAB — CULTURE-BLOOD W/SENSITIVITY

## 2017-12-24 ENCOUNTER — Ambulatory Visit: Admit: 2017-12-24 | Discharge: 2017-12-25 | Payer: MEDICARE

## 2017-12-24 ENCOUNTER — Encounter: Admit: 2017-12-24 | Discharge: 2017-12-24 | Payer: MEDICARE

## 2017-12-24 DIAGNOSIS — I358 Other nonrheumatic aortic valve disorders: ICD-10-CM

## 2017-12-24 DIAGNOSIS — I4819 Other persistent atrial fibrillation: ICD-10-CM

## 2017-12-24 DIAGNOSIS — Z9289 Personal history of other medical treatment: ICD-10-CM

## 2017-12-24 DIAGNOSIS — R0600 Dyspnea, unspecified: ICD-10-CM

## 2017-12-24 DIAGNOSIS — C4490 Unspecified malignant neoplasm of skin, unspecified: ICD-10-CM

## 2017-12-24 DIAGNOSIS — I5032 Chronic diastolic (congestive) heart failure: ICD-10-CM

## 2017-12-24 DIAGNOSIS — E785 Hyperlipidemia, unspecified: ICD-10-CM

## 2017-12-24 DIAGNOSIS — J302 Other seasonal allergic rhinitis: ICD-10-CM

## 2017-12-24 DIAGNOSIS — I4891 Unspecified atrial fibrillation: ICD-10-CM

## 2017-12-24 DIAGNOSIS — I10 Essential (primary) hypertension: ICD-10-CM

## 2017-12-24 DIAGNOSIS — I472 Ventricular tachycardia: ICD-10-CM

## 2017-12-24 DIAGNOSIS — R002 Palpitations: ICD-10-CM

## 2017-12-24 DIAGNOSIS — R0989 Other specified symptoms and signs involving the circulatory and respiratory systems: ICD-10-CM

## 2017-12-24 DIAGNOSIS — I350 Nonrheumatic aortic (valve) stenosis: Principal | ICD-10-CM

## 2017-12-24 DIAGNOSIS — N184 Chronic kidney disease, stage 4 (severe): ICD-10-CM

## 2018-01-01 ENCOUNTER — Encounter: Admit: 2018-01-01 | Discharge: 2018-01-01 | Payer: MEDICARE

## 2018-01-18 ENCOUNTER — Encounter: Admit: 2018-01-18 | Discharge: 2018-01-18 | Payer: MEDICARE

## 2018-01-22 ENCOUNTER — Encounter: Admit: 2018-01-22 | Discharge: 2018-01-22 | Payer: MEDICARE

## 2018-01-26 ENCOUNTER — Encounter: Admit: 2018-01-26 | Discharge: 2018-01-26 | Payer: MEDICARE

## 2018-01-28 ENCOUNTER — Encounter: Admit: 2018-01-28 | Discharge: 2018-01-28 | Payer: MEDICARE

## 2018-01-28 DIAGNOSIS — I4891 Unspecified atrial fibrillation: ICD-10-CM

## 2018-01-28 DIAGNOSIS — E782 Mixed hyperlipidemia: Principal | ICD-10-CM

## 2018-01-28 DIAGNOSIS — Z79899 Other long term (current) drug therapy: ICD-10-CM

## 2018-01-28 LAB — LIPID PROFILE
Lab: 60
Lab: 148 — ABNORMAL LOW (ref 150–200)
Lab: 2
Lab: 23
Lab: 71

## 2018-01-28 LAB — LIVER FUNCTION PANEL: Lab: 0.6

## 2018-01-28 LAB — TSH WITH FREE T4 REFLEX: Lab: 2

## 2018-02-11 ENCOUNTER — Encounter: Admit: 2018-02-11 | Discharge: 2018-02-11 | Payer: MEDICARE

## 2018-02-11 ENCOUNTER — Ambulatory Visit: Admit: 2018-02-11 | Discharge: 2018-02-12 | Payer: MEDICARE

## 2018-02-11 DIAGNOSIS — R600 Localized edema: ICD-10-CM

## 2018-02-11 DIAGNOSIS — I10 Essential (primary) hypertension: ICD-10-CM

## 2018-02-11 DIAGNOSIS — I358 Other nonrheumatic aortic valve disorders: ICD-10-CM

## 2018-02-11 DIAGNOSIS — C4490 Unspecified malignant neoplasm of skin, unspecified: ICD-10-CM

## 2018-02-11 DIAGNOSIS — I4819 Other persistent atrial fibrillation: ICD-10-CM

## 2018-02-11 DIAGNOSIS — J302 Other seasonal allergic rhinitis: ICD-10-CM

## 2018-02-11 DIAGNOSIS — R0600 Dyspnea, unspecified: ICD-10-CM

## 2018-02-11 DIAGNOSIS — Z79899 Other long term (current) drug therapy: ICD-10-CM

## 2018-02-11 DIAGNOSIS — I472 Ventricular tachycardia: ICD-10-CM

## 2018-02-11 DIAGNOSIS — I5032 Chronic diastolic (congestive) heart failure: ICD-10-CM

## 2018-02-11 DIAGNOSIS — N184 Chronic kidney disease, stage 4 (severe): ICD-10-CM

## 2018-02-11 DIAGNOSIS — I350 Nonrheumatic aortic (valve) stenosis: ICD-10-CM

## 2018-02-11 DIAGNOSIS — I4891 Unspecified atrial fibrillation: ICD-10-CM

## 2018-02-11 DIAGNOSIS — E785 Hyperlipidemia, unspecified: ICD-10-CM

## 2018-02-11 MED ORDER — AMIODARONE 200 MG PO TAB
200 mg | ORAL_TABLET | Freq: Every day | ORAL | 1 refills | 42.00000 days | Status: AC
Start: 2018-02-11 — End: 2018-04-21

## 2018-02-11 MED ORDER — CANDESARTAN 32 MG PO TAB
32 mg | ORAL_TABLET | Freq: Every day | ORAL | 3 refills | Status: AC
Start: 2018-02-11 — End: 2018-12-06

## 2018-02-11 MED ORDER — DILTIAZEM HCL 120 MG PO CP24
120 mg | ORAL_CAPSULE | Freq: Every day | ORAL | 0 refills | 45.00000 days | Status: AC
Start: 2018-02-11 — End: 2018-03-16

## 2018-02-11 MED ORDER — AMIODARONE 200 MG PO TAB
200 mg | ORAL_TABLET | Freq: Every day | ORAL | 1 refills | 42.00000 days | Status: AC
Start: 2018-02-11 — End: 2018-02-11

## 2018-03-15 ENCOUNTER — Encounter: Admit: 2018-03-15 | Discharge: 2018-03-15 | Payer: MEDICARE

## 2018-03-16 ENCOUNTER — Encounter: Admit: 2018-03-16 | Discharge: 2018-03-16 | Payer: MEDICARE

## 2018-03-16 ENCOUNTER — Ambulatory Visit: Admit: 2018-03-16 | Discharge: 2018-03-16 | Payer: MEDICARE

## 2018-03-16 DIAGNOSIS — E785 Hyperlipidemia, unspecified: ICD-10-CM

## 2018-03-16 DIAGNOSIS — I10 Essential (primary) hypertension: ICD-10-CM

## 2018-03-16 DIAGNOSIS — R0989 Other specified symptoms and signs involving the circulatory and respiratory systems: ICD-10-CM

## 2018-03-16 DIAGNOSIS — I4891 Unspecified atrial fibrillation: ICD-10-CM

## 2018-03-16 DIAGNOSIS — C4490 Unspecified malignant neoplasm of skin, unspecified: ICD-10-CM

## 2018-03-16 DIAGNOSIS — T887XXA Unspecified adverse effect of drug or medicament, initial encounter: ICD-10-CM

## 2018-03-16 DIAGNOSIS — I4819 Other persistent atrial fibrillation: ICD-10-CM

## 2018-03-16 DIAGNOSIS — N184 Chronic kidney disease, stage 4 (severe): ICD-10-CM

## 2018-03-16 DIAGNOSIS — Z79899 Other long term (current) drug therapy: ICD-10-CM

## 2018-03-16 DIAGNOSIS — R42 Dizziness and giddiness: ICD-10-CM

## 2018-03-16 DIAGNOSIS — R002 Palpitations: ICD-10-CM

## 2018-03-16 DIAGNOSIS — I472 Ventricular tachycardia: ICD-10-CM

## 2018-03-16 DIAGNOSIS — R600 Localized edema: ICD-10-CM

## 2018-03-16 DIAGNOSIS — I358 Other nonrheumatic aortic valve disorders: ICD-10-CM

## 2018-03-16 DIAGNOSIS — I350 Nonrheumatic aortic (valve) stenosis: ICD-10-CM

## 2018-03-16 DIAGNOSIS — J302 Other seasonal allergic rhinitis: ICD-10-CM

## 2018-03-16 MED ORDER — DILTIAZEM HCL 30 MG PO TAB
30 mg | ORAL_TABLET | Freq: Two times a day (BID) | ORAL | 3 refills | 45.00000 days | Status: AC
Start: 2018-03-16 — End: 2018-04-21

## 2018-03-16 MED ORDER — CHLORTHALIDONE 25 MG PO TAB
25 mg | ORAL_TABLET | Freq: Every day | ORAL | 3 refills | Status: AC
Start: 2018-03-16 — End: 2018-04-21

## 2018-03-26 ENCOUNTER — Ambulatory Visit: Admit: 2018-03-26 | Discharge: 2018-03-26 | Payer: MEDICARE

## 2018-03-26 ENCOUNTER — Encounter: Admit: 2018-03-26 | Discharge: 2018-03-26 | Payer: MEDICARE

## 2018-03-26 ENCOUNTER — Ambulatory Visit: Admit: 2018-03-26 | Discharge: 2018-03-27 | Payer: MEDICARE

## 2018-03-26 DIAGNOSIS — I4819 Other persistent atrial fibrillation: ICD-10-CM

## 2018-03-26 DIAGNOSIS — I10 Essential (primary) hypertension: ICD-10-CM

## 2018-03-26 DIAGNOSIS — E785 Hyperlipidemia, unspecified: Principal | ICD-10-CM

## 2018-03-26 DIAGNOSIS — I350 Nonrheumatic aortic (valve) stenosis: ICD-10-CM

## 2018-03-29 ENCOUNTER — Encounter: Admit: 2018-03-29 | Discharge: 2018-03-29 | Payer: MEDICARE

## 2018-04-07 ENCOUNTER — Encounter: Admit: 2018-04-07 | Discharge: 2018-04-07 | Payer: MEDICARE

## 2018-04-16 ENCOUNTER — Encounter: Admit: 2018-04-16 | Discharge: 2018-04-16 | Payer: MEDICARE

## 2018-04-20 ENCOUNTER — Encounter: Admit: 2018-04-20 | Discharge: 2018-04-20 | Payer: MEDICARE

## 2018-04-20 DIAGNOSIS — I350 Nonrheumatic aortic (valve) stenosis: Secondary | ICD-10-CM

## 2018-04-20 DIAGNOSIS — I4819 Other persistent atrial fibrillation: Secondary | ICD-10-CM

## 2018-04-20 DIAGNOSIS — E785 Hyperlipidemia, unspecified: Secondary | ICD-10-CM

## 2018-04-20 DIAGNOSIS — I10 Essential (primary) hypertension: Secondary | ICD-10-CM

## 2018-04-20 LAB — BASIC METABOLIC PANEL
Lab: 1.9 — ABNORMAL HIGH (ref 0.72–1.25)
Lab: 104
Lab: 13
Lab: 24
Lab: 5.1
Lab: 111 — ABNORMAL HIGH (ref 70–105)
Lab: 136
Lab: 40 — ABNORMAL HIGH (ref 8.4–25.7)
Lab: 9.2

## 2018-04-21 ENCOUNTER — Ambulatory Visit: Admit: 2018-04-21 | Discharge: 2018-04-21 | Payer: MEDICARE

## 2018-04-21 ENCOUNTER — Encounter: Admit: 2018-04-21 | Discharge: 2018-04-21 | Payer: MEDICARE

## 2018-04-21 DIAGNOSIS — C4490 Unspecified malignant neoplasm of skin, unspecified: Secondary | ICD-10-CM

## 2018-04-21 DIAGNOSIS — I358 Other nonrheumatic aortic valve disorders: Secondary | ICD-10-CM

## 2018-04-21 DIAGNOSIS — I10 Essential (primary) hypertension: Secondary | ICD-10-CM

## 2018-04-21 DIAGNOSIS — E785 Hyperlipidemia, unspecified: Secondary | ICD-10-CM

## 2018-04-21 DIAGNOSIS — T887XXA Unspecified adverse effect of drug or medicament, initial encounter: Secondary | ICD-10-CM

## 2018-04-21 DIAGNOSIS — N184 Chronic kidney disease, stage 4 (severe): Secondary | ICD-10-CM

## 2018-04-21 DIAGNOSIS — R0602 Shortness of breath: Secondary | ICD-10-CM

## 2018-04-21 DIAGNOSIS — I4819 Other persistent atrial fibrillation: Secondary | ICD-10-CM

## 2018-04-21 DIAGNOSIS — R0989 Other specified symptoms and signs involving the circulatory and respiratory systems: Secondary | ICD-10-CM

## 2018-04-21 DIAGNOSIS — Z79899 Other long term (current) drug therapy: Secondary | ICD-10-CM

## 2018-04-21 DIAGNOSIS — R600 Localized edema: Secondary | ICD-10-CM

## 2018-04-21 DIAGNOSIS — J302 Other seasonal allergic rhinitis: Secondary | ICD-10-CM

## 2018-04-21 DIAGNOSIS — I4891 Unspecified atrial fibrillation: Secondary | ICD-10-CM

## 2018-04-21 DIAGNOSIS — I5032 Chronic diastolic (congestive) heart failure: Secondary | ICD-10-CM

## 2018-04-21 MED ORDER — FUROSEMIDE 40 MG PO TAB
40 mg | ORAL_TABLET | Freq: Two times a day (BID) | ORAL | 0 refills | 90.00000 days | Status: AC
Start: 2018-04-21 — End: 2018-05-18

## 2018-04-28 ENCOUNTER — Encounter: Admit: 2018-04-28 | Discharge: 2018-04-28 | Payer: MEDICARE

## 2018-04-28 ENCOUNTER — Ambulatory Visit: Admit: 2018-04-28 | Discharge: 2018-04-29 | Payer: MEDICARE

## 2018-04-28 DIAGNOSIS — I358 Other nonrheumatic aortic valve disorders: Secondary | ICD-10-CM

## 2018-04-28 DIAGNOSIS — I480 Paroxysmal atrial fibrillation: ICD-10-CM

## 2018-04-28 DIAGNOSIS — I10 Essential (primary) hypertension: Secondary | ICD-10-CM

## 2018-04-28 DIAGNOSIS — I4819 Other persistent atrial fibrillation: Secondary | ICD-10-CM

## 2018-04-28 DIAGNOSIS — J302 Other seasonal allergic rhinitis: Secondary | ICD-10-CM

## 2018-04-28 DIAGNOSIS — E785 Hyperlipidemia, unspecified: Secondary | ICD-10-CM

## 2018-04-28 DIAGNOSIS — C4490 Unspecified malignant neoplasm of skin, unspecified: Secondary | ICD-10-CM

## 2018-04-28 MED ORDER — LIDOCAINE HCL 2 % MM JELP
Freq: Once | TOPICAL | 0 refills | Status: CN
Start: 2018-04-28 — End: ?

## 2018-04-28 MED ORDER — LIDOCAINE (PF) 10 MG/ML (1 %) IJ SOLN
.1-2 mL | INTRAMUSCULAR | 0 refills | Status: CN | PRN
Start: 2018-04-28 — End: ?

## 2018-04-28 MED ORDER — SODIUM CHLORIDE 0.9 % IV SOLP
INTRAVENOUS | 0 refills | Status: CN
Start: 2018-04-28 — End: ?

## 2018-04-29 ENCOUNTER — Encounter: Admit: 2018-04-29 | Discharge: 2018-04-29 | Payer: MEDICARE

## 2018-04-29 DIAGNOSIS — J302 Other seasonal allergic rhinitis: Secondary | ICD-10-CM

## 2018-04-29 DIAGNOSIS — C4490 Unspecified malignant neoplasm of skin, unspecified: Secondary | ICD-10-CM

## 2018-04-29 DIAGNOSIS — I358 Other nonrheumatic aortic valve disorders: Secondary | ICD-10-CM

## 2018-04-29 DIAGNOSIS — E785 Hyperlipidemia, unspecified: Secondary | ICD-10-CM

## 2018-04-29 DIAGNOSIS — I10 Essential (primary) hypertension: Secondary | ICD-10-CM

## 2018-04-30 LAB — BASIC METABOLIC PANEL
Lab: 100
Lab: 128 — ABNORMAL HIGH (ref 70–105)
Lab: 136 — ABNORMAL LOW (ref 14.0–18.0)
Lab: 2 mg/dL — ABNORMAL HIGH (ref 0.72–1.25)
Lab: 27 — ABNORMAL LOW (ref 27.0–31.0)
Lab: 38 — ABNORMAL HIGH (ref 8.4–25.7)
Lab: 4.7 — ABNORMAL LOW (ref 42.0–52.0)
Lab: 9 — ABNORMAL HIGH (ref 11.5–14.5)

## 2018-04-30 LAB — CBC
Lab: 4.1 — ABNORMAL LOW (ref 4.70–6.10)
Lab: 7.4

## 2018-04-30 LAB — MAGNESIUM: Lab: 1.9

## 2018-05-03 ENCOUNTER — Encounter: Admit: 2018-05-03 | Discharge: 2018-05-03 | Payer: MEDICARE

## 2018-05-03 DIAGNOSIS — I4819 Other persistent atrial fibrillation: Principal | ICD-10-CM

## 2018-05-07 ENCOUNTER — Encounter: Admit: 2018-05-07 | Discharge: 2018-05-07 | Payer: MEDICARE

## 2018-05-07 NOTE — Telephone Encounter
RN returning patient call (LPipes, RN called earlier and LVM for patient requesting call back). RN called primary number listed for patient [phone]) and was sent directly to voicemail, where RN was able to LVM with call back number requesting call back to discuss medication change. RN called number listed for spouse of patient Aggie Cosier [phone]) and the call was not able to be completed. Will remain available. Frances Furbish, RN.    ----- Message from Jen Mow, MD sent at 05/06/2018  5:56 PM CST -----  Decrease Atacand by half (16 mg) until he sees a nephrologist.  ----- Message -----  From: Janett Labella, RN  Sent: 05/03/2018   2:01 PM CST  To: Maryann Conners, RN, Jen Mow, MD  ???  Please review kidney labs. Creatinine and BUN worse than usual. Scheduled for Afib ablation on 05/12/2018. Concerns or recs?    RAD: Patient CB? See phone note, needs to cut Acatnad.   Received: Today   Message Contents   Pipes, Malena Catholic, RN  P Cvm Nurse Ep

## 2018-05-12 ENCOUNTER — Ambulatory Visit: Admit: 2018-05-12 | Discharge: 2018-05-12 | Payer: MEDICARE

## 2018-05-12 ENCOUNTER — Encounter: Admit: 2018-05-12 | Discharge: 2018-05-12 | Payer: MEDICARE

## 2018-05-12 DIAGNOSIS — I358 Other nonrheumatic aortic valve disorders: ICD-10-CM

## 2018-05-12 DIAGNOSIS — C4490 Unspecified malignant neoplasm of skin, unspecified: ICD-10-CM

## 2018-05-12 DIAGNOSIS — I483 Typical atrial flutter: Secondary | ICD-10-CM

## 2018-05-12 DIAGNOSIS — E785 Hyperlipidemia, unspecified: ICD-10-CM

## 2018-05-12 DIAGNOSIS — J302 Other seasonal allergic rhinitis: ICD-10-CM

## 2018-05-12 DIAGNOSIS — I10 Essential (primary) hypertension: ICD-10-CM

## 2018-05-12 LAB — POC ACTIVATED CLOTTING TIME
Lab: 315 s
Lab: 320 s
Lab: 392 s
Lab: 385 s
Lab: 434 s

## 2018-05-12 LAB — POC PT/INR: Lab: 1.6 % — ABNORMAL HIGH (ref 0.8–1.2)

## 2018-05-12 MED ORDER — LIDOCAINE HCL 2 % MM JELP
Freq: Once | TOPICAL | 0 refills | Status: AC
Start: 2018-05-12 — End: ?

## 2018-05-12 MED ORDER — LIDOCAINE (PF) 10 MG/ML (1 %) IJ SOLN
.1-2 mL | INTRAMUSCULAR | 0 refills | Status: DC | PRN
Start: 2018-05-12 — End: 2018-05-13

## 2018-05-12 MED ORDER — BENZOCAINE-MENTHOL 6-10 MG MM LOZG
1 | ORAL | 0 refills | Status: DC | PRN
Start: 2018-05-12 — End: 2018-05-13
  Administered 2018-05-13: 04:00:00 1 via ORAL

## 2018-05-12 MED ORDER — RIVAROXABAN 15 MG PO TAB
15 mg | Freq: Every day | ORAL | 0 refills | Status: DC
Start: 2018-05-12 — End: 2018-05-13
  Administered 2018-05-13 (×2): 15 mg via ORAL

## 2018-05-12 MED ORDER — FUROSEMIDE 10 MG/ML IJ SOLN
0 refills | Status: DC
Start: 2018-05-12 — End: 2018-05-12

## 2018-05-12 MED ORDER — VALSARTAN 160 MG PO TAB
160 mg | Freq: Every day | ORAL | 0 refills | Status: DC
Start: 2018-05-12 — End: 2018-05-13
  Administered 2018-05-13: 14:00:00 160 mg via ORAL

## 2018-05-12 MED ORDER — ONDANSETRON HCL (PF) 4 MG/2 ML IJ SOLN
4 mg | INTRAVENOUS | 0 refills | Status: DC | PRN
Start: 2018-05-12 — End: 2018-05-13

## 2018-05-12 MED ORDER — ONDANSETRON HCL (PF) 4 MG/2 ML IJ SOLN
INTRAVENOUS | 0 refills | Status: DC
Start: 2018-05-12 — End: 2018-05-12

## 2018-05-12 MED ORDER — MORPHINE 4 MG/ML IV CRTG
2 mg | Freq: Once | INTRAVENOUS | 0 refills | Status: AC | PRN
Start: 2018-05-12 — End: ?

## 2018-05-12 MED ORDER — FUROSEMIDE 40 MG PO TAB
40 mg | Freq: Two times a day (BID) | ORAL | 0 refills | Status: DC
Start: 2018-05-12 — End: 2018-05-13
  Administered 2018-05-13: 14:00:00 40 mg via ORAL

## 2018-05-12 MED ORDER — LIDOCAINE (PF) 200 MG/10 ML (2 %) IJ SYRG
0 refills | Status: DC
Start: 2018-05-12 — End: 2018-05-12

## 2018-05-12 MED ORDER — DIPHENHYDRAMINE HCL 50 MG/ML IJ SOLN
25 mg | Freq: Once | INTRAVENOUS | 0 refills | Status: AC | PRN
Start: 2018-05-12 — End: ?

## 2018-05-12 MED ORDER — PANTOPRAZOLE(#) 2MG/ML PO SUSP
40 mg | Freq: Two times a day (BID) | ORAL | 0 refills | Status: DC
Start: 2018-05-12 — End: 2018-05-12

## 2018-05-12 MED ORDER — ALUMINUM-MAGNESIUM HYDROXIDE 200-200 MG/5 ML PO SUSP
30 mL | ORAL | 0 refills | Status: DC | PRN
Start: 2018-05-12 — End: 2018-05-13

## 2018-05-12 MED ORDER — ROCURONIUM 10 MG/ML IV SOLN
INTRAVENOUS | 0 refills | Status: DC
Start: 2018-05-12 — End: 2018-05-12

## 2018-05-12 MED ORDER — SODIUM CHLORIDE 0.9 % IV SOLP
INTRAVENOUS | 0 refills | Status: DC
Start: 2018-05-12 — End: 2018-05-13
  Administered 2018-05-12: 16:00:00 1000.000 mL via INTRAVENOUS

## 2018-05-12 MED ORDER — MEPERIDINE (PF) 25 MG/ML IJ SYRG
12.5 mg | INTRAVENOUS | 0 refills | Status: DC | PRN
Start: 2018-05-12 — End: 2018-05-13

## 2018-05-12 MED ORDER — PROPOFOL INJ 10 MG/ML IV VIAL
0 refills | Status: DC
Start: 2018-05-12 — End: 2018-05-12

## 2018-05-12 MED ORDER — SUCRALFATE 1 GRAM PO TAB
1 g | Freq: Two times a day (BID) | ORAL | 0 refills | Status: DC
Start: 2018-05-12 — End: 2018-05-13
  Administered 2018-05-13: 14:00:00 1 g via ORAL

## 2018-05-12 MED ORDER — METOCLOPRAMIDE HCL 5 MG/ML IJ SOLN
10 mg | Freq: Once | INTRAVENOUS | 0 refills | Status: AC | PRN
Start: 2018-05-12 — End: ?

## 2018-05-12 MED ORDER — PHENYLEPHRINE IV DRIP (DBL CONC)
0 refills | Status: DC
Start: 2018-05-12 — End: 2018-05-12

## 2018-05-12 MED ORDER — DEXTRAN 70-HYPROMELLOSE (PF) 0.1-0.3 % OP DPET
0 refills | Status: DC
Start: 2018-05-12 — End: 2018-05-12

## 2018-05-12 MED ORDER — FLUTICASONE PROPION-SALMETEROL 100-50 MCG/DOSE IN DSDV
1 | Freq: Every day | RESPIRATORY_TRACT | 0 refills | Status: DC
Start: 2018-05-12 — End: 2018-05-13
  Administered 2018-05-13: 12:00:00 1 via RESPIRATORY_TRACT

## 2018-05-12 MED ORDER — FENTANYL CITRATE (PF) 50 MCG/ML IJ SOLN
0 refills | Status: DC
Start: 2018-05-12 — End: 2018-05-12

## 2018-05-12 MED ORDER — ACETAMINOPHEN 325 MG PO TAB
650 mg | ORAL | 0 refills | Status: DC | PRN
Start: 2018-05-12 — End: 2018-05-13

## 2018-05-12 MED ORDER — PANTOPRAZOLE 40 MG PO TBEC
40 mg | Freq: Every day | ORAL | 0 refills | Status: DC
Start: 2018-05-12 — End: 2018-05-13
  Administered 2018-05-13: 04:00:00 40 mg via ORAL

## 2018-05-12 MED ORDER — SODIUM CHLORIDE 0.9 % IV SOLP
INTRAVENOUS | 0 refills | Status: DC
Start: 2018-05-12 — End: 2018-05-13

## 2018-05-12 MED ORDER — FENTANYL CITRATE (PF) 50 MCG/ML IJ SOLN
50 ug | INTRAVENOUS | 0 refills | Status: DC | PRN
Start: 2018-05-12 — End: 2018-05-13

## 2018-05-12 MED ORDER — DEXAMETHASONE SODIUM PHOSPHATE 4 MG/ML IJ SOLN
INTRAVENOUS | 0 refills | Status: DC
Start: 2018-05-12 — End: 2018-05-12

## 2018-05-12 MED ORDER — FENTANYL CITRATE (PF) 50 MCG/ML IJ SOLN
25 ug | INTRAVENOUS | 0 refills | Status: DC | PRN
Start: 2018-05-12 — End: 2018-05-13

## 2018-05-12 MED ORDER — ONDANSETRON HCL (PF) 4 MG/2 ML IJ SOLN
4 mg | Freq: Once | INTRAVENOUS | 0 refills | Status: AC | PRN
Start: 2018-05-12 — End: ?

## 2018-05-12 MED ORDER — PROTAMINE IVPB
20 mg | INTRAVENOUS | 0 refills | Status: AC | PRN
Start: 2018-05-12 — End: ?

## 2018-05-12 NOTE — Anesthesia Post-Procedure Evaluation
Post-Anesthesia Evaluation    Name: Ray [name]      MRN: [id number]     DOB: 1935/05/15     Age: 83 y.o.     Sex: male   __________________________________________________________________________     Procedure Date: 05/12/2018  Procedure(s):  INTRACARDIAC CATHETER ABLATION WITH COMPREHENSIVE ELECTROPHYSIOLOGIC EVALUATION - ATRIAL FIBRILLATION      Surgeon: Surgeon(s):  Dendi, Raghuveer, MD    Post-Anesthesia Vitals  BP: 141/72 (02/12 1745)  Temp: 36.4 ???C (97.5 ???F) (02/12 1737)  Pulse: 66 (02/12 1745)  Respirations: 14 PER MINUTE (02/12 1745)  SpO2: 95 % (02/12 1745)  SpO2 Pulse: 67 (02/12 1745)   Vitals Value Taken Time   BP 141/72 05/12/2018  5:45 PM   Temp 36.4 ???C (97.5 ???F) 05/12/2018  5:37 PM   Pulse 66 05/12/2018  5:45 PM   Respirations 14 PER MINUTE 05/12/2018  5:45 PM   SpO2 95 % 05/12/2018  5:45 PM         Post Anesthesia Evaluation Note    Evaluation location: Pre/Post  Patient participation: recovered; patient participated in evaluation  Level of consciousness: alert    Pain score: 0  Pain management: adequate    Hydration: normovolemia  Temperature: 36.0???C - 38.4???C  Airway patency: adequate    Perioperative Events       Post-op nausea and vomiting: no PONV    Postoperative Status  Cardiovascular status: hemodynamically stable  Respiratory status: spontaneous ventilation  Follow-up needed: none        Perioperative Events  Perioperative Event: No  Emergency Case Activation: No

## 2018-05-13 ENCOUNTER — Encounter: Admit: 2018-05-12 | Discharge: 2018-05-13 | Payer: MEDICARE

## 2018-05-13 DIAGNOSIS — Z79899 Other long term (current) drug therapy: Secondary | ICD-10-CM

## 2018-05-13 DIAGNOSIS — I5032 Chronic diastolic (congestive) heart failure: Secondary | ICD-10-CM

## 2018-05-13 DIAGNOSIS — I483 Typical atrial flutter: Secondary | ICD-10-CM

## 2018-05-13 DIAGNOSIS — I4819 Other persistent atrial fibrillation: Secondary | ICD-10-CM

## 2018-05-13 DIAGNOSIS — I472 Ventricular tachycardia: ICD-10-CM

## 2018-05-13 DIAGNOSIS — R001 Bradycardia, unspecified: ICD-10-CM

## 2018-05-13 LAB — POC ACTIVATED CLOTTING TIME
Lab: 228 s
Lab: 275 s

## 2018-05-13 LAB — CBC: Lab: 8 10*3/uL (ref 4.5–11.0)

## 2018-05-13 LAB — BASIC METABOLIC PANEL
Lab: 1.5 mg/dL — ABNORMAL HIGH (ref >=40)
Lab: 102 MMOL/L — ABNORMAL LOW (ref 98–110)
Lab: 11 pg — ABNORMAL LOW (ref 3–12)
Lab: 136 MMOL/L — ABNORMAL LOW (ref 137–147)
Lab: 169 mg/dL — ABNORMAL HIGH (ref 70–100)
Lab: 4.7 MMOL/L — ABNORMAL LOW (ref 3.5–5.1)
Lab: 42 mg/dL — ABNORMAL HIGH (ref <200)
Lab: 43 mL/min — ABNORMAL LOW (ref >60)
Lab: 52 mL/min — ABNORMAL LOW (ref >60)
Lab: 8.6 mg/dL — ABNORMAL LOW (ref <150)

## 2018-05-13 MED ORDER — SUCRALFATE 1 GRAM PO TAB
1 g | ORAL_TABLET | Freq: Two times a day (BID) | ORAL | 0 refills | Status: AC
Start: 2018-05-13 — End: 2018-08-26

## 2018-05-13 MED ORDER — PANTOPRAZOLE 40 MG PO TBEC
40 mg | ORAL_TABLET | Freq: Two times a day (BID) | ORAL | 0 refills | 90.00000 days | Status: AC
Start: 2018-05-13 — End: 2018-08-26

## 2018-05-14 ENCOUNTER — Encounter: Admit: 2018-05-14 | Discharge: 2018-05-14 | Payer: MEDICARE

## 2018-05-14 DIAGNOSIS — C4490 Unspecified malignant neoplasm of skin, unspecified: ICD-10-CM

## 2018-05-14 DIAGNOSIS — E785 Hyperlipidemia, unspecified: ICD-10-CM

## 2018-05-14 DIAGNOSIS — I358 Other nonrheumatic aortic valve disorders: ICD-10-CM

## 2018-05-14 DIAGNOSIS — J302 Other seasonal allergic rhinitis: ICD-10-CM

## 2018-05-14 DIAGNOSIS — I10 Essential (primary) hypertension: ICD-10-CM

## 2018-05-14 NOTE — Telephone Encounter
-----   Message from Golda Acre, LPN sent at 06/21/4008  2:05 PM CST -----  Regarding: RAD- Fever  VM from wife # [phone] on triage line.  He had ablation and is running a fever of 100.9  What should he do or is this normal?  Call patient back at # [phone].

## 2018-05-15 ENCOUNTER — Emergency Department: Admit: 2018-05-15 | Discharge: 2018-05-15 | Payer: MEDICARE

## 2018-05-15 ENCOUNTER — Emergency Department: Admit: 2018-05-14 | Discharge: 2018-05-15 | Disposition: A | Discharge status: Home / Self Care | Payer: MEDICARE

## 2018-05-15 ENCOUNTER — Encounter: Admit: 2018-05-15 | Discharge: 2018-05-15 | Payer: MEDICARE

## 2018-05-15 DIAGNOSIS — Z8679 Personal history of other diseases of the circulatory system: ICD-10-CM

## 2018-05-15 DIAGNOSIS — R509 Fever, unspecified: Principal | ICD-10-CM

## 2018-05-15 DIAGNOSIS — R7989 Other specified abnormal findings of blood chemistry: ICD-10-CM

## 2018-05-15 DIAGNOSIS — Z9889 Other specified postprocedural states: ICD-10-CM

## 2018-05-15 LAB — COMPREHENSIVE METABOLIC PANEL
Lab: 1.8 mg/dL — ABNORMAL HIGH (ref 0.4–1.24)
Lab: 100 MMOL/L — ABNORMAL LOW (ref 98–110)
Lab: 136 MMOL/L — ABNORMAL LOW (ref 137–147)
Lab: 35 mL/min — ABNORMAL LOW (ref >60)
Lab: 4.1 MMOL/L — ABNORMAL LOW (ref 3.5–5.1)
Lab: 43 mL/min — ABNORMAL LOW (ref >60)

## 2018-05-15 LAB — POC CREATININE, RAD: Lab: 2 mg/dL — ABNORMAL HIGH (ref 0.4–1.24)

## 2018-05-15 LAB — POC TROPONIN
Lab: 0.5 ng/mL — ABNORMAL HIGH (ref 0.00–0.05)
Lab: 0.3 ng/mL — ABNORMAL HIGH (ref 0.00–0.05)

## 2018-05-15 LAB — INFLUENZA A/B AND RSV PCR
Lab: NEGATIVE mg/dL — ABNORMAL HIGH (ref 0.3–1.2)
Lab: NEGATIVE mg/dL — ABNORMAL HIGH (ref 8.5–10.6)

## 2018-05-15 LAB — BNP POC ER: Lab: 146 pg/mL — ABNORMAL HIGH (ref 0–100)

## 2018-05-15 LAB — CBC AND DIFF
Lab: 0 10*3/uL (ref 0–0.45)
Lab: 0.1 10*3/uL (ref 0–0.20)
Lab: 10 10*3/uL (ref 4.5–11.0)

## 2018-05-15 LAB — TSH WITH FREE T4 REFLEX: Lab: 1.7 uU/mL — ABNORMAL HIGH (ref 0.35–5.00)

## 2018-05-15 LAB — LIPASE: Lab: 27 U/L (ref 11–82)

## 2018-05-15 LAB — MAGNESIUM: Lab: 2 mg/dL — ABNORMAL HIGH (ref 1.6–2.6)

## 2018-05-15 MED ORDER — SODIUM CHLORIDE 0.9 % IJ SOLN
50 mL | Freq: Once | INTRAVENOUS | 0 refills | Status: CP
Start: 2018-05-15 — End: ?
  Administered 2018-05-15: 09:00:00 50 mL via INTRAVENOUS

## 2018-05-15 MED ORDER — AZITHROMYCIN 250 MG PO TAB
500 mg | Freq: Once | ORAL | 0 refills | Status: CP
Start: 2018-05-15 — End: ?
  Administered 2018-05-15: 13:00:00 500 mg via ORAL

## 2018-05-15 MED ORDER — DIATRIZOATE MEG-DIATRIZOAT SOD 66-10 % PO SOLN
5 mL | Freq: Once | ORAL | 0 refills | Status: CP
Start: 2018-05-15 — End: ?
  Administered 2018-05-15: 09:00:00 5 mL via ORAL

## 2018-05-15 MED ORDER — AZITHROMYCIN 250 MG PO TAB
250 mg | ORAL_TABLET | Freq: Two times a day (BID) | ORAL | 0 refills | Status: AC
Start: 2018-05-15 — End: 2018-06-18

## 2018-05-15 MED ORDER — IOHEXOL 350 MG IODINE/ML IV SOLN
60 mL | Freq: Once | INTRAVENOUS | 0 refills | Status: CP
Start: 2018-05-15 — End: ?
  Administered 2018-05-15: 09:00:00 60 mL via INTRAVENOUS

## 2018-05-15 NOTE — ED Notes
83 y/o M to RM 96 with c/o fevers that he noticed when waking up on 2/14. Pt reports fever as high as 101.4 at home. Pt did not attempt home anti-pyretics due to concerns of interfering with post op care. Pt states he remained febrile into the afternoon. Pt's wife states the cardiology team informed them to come to the Emergency room. Pt reports having a cardiac ablation on Wednesday, 2/12. Pt also complains of a sore throat/esophagus that worsens when eating. Pt reports having slight cough and SOA in triage. While in triage patient reported feeling like he was in afib. Pt denies chest pain and SOA at this time. VSS at this time.     Bed locked and in lowest position, call light within reach. Pt family at bedside. Belongings kept with family.

## 2018-05-15 NOTE — ED Notes
Pt given diet coke per OK from Dr Osie Bond

## 2018-05-15 NOTE — ED Notes
I have reviewed and agree with Kylie Thompson's (nursing student) charting.

## 2018-05-18 ENCOUNTER — Encounter: Admit: 2018-05-18 | Discharge: 2018-05-18 | Payer: MEDICARE

## 2018-05-18 ENCOUNTER — Ambulatory Visit: Admit: 2018-05-18 | Discharge: 2018-05-18 | Payer: MEDICARE

## 2018-05-18 DIAGNOSIS — Z9889 Other specified postprocedural states: ICD-10-CM

## 2018-05-18 DIAGNOSIS — I4819 Other persistent atrial fibrillation: ICD-10-CM

## 2018-05-18 DIAGNOSIS — I472 Ventricular tachycardia: ICD-10-CM

## 2018-05-18 DIAGNOSIS — E7800 Pure hypercholesterolemia, unspecified: Principal | ICD-10-CM

## 2018-05-18 DIAGNOSIS — I358 Other nonrheumatic aortic valve disorders: ICD-10-CM

## 2018-05-18 DIAGNOSIS — E785 Hyperlipidemia, unspecified: ICD-10-CM

## 2018-05-18 DIAGNOSIS — T887XXA Unspecified adverse effect of drug or medicament, initial encounter: ICD-10-CM

## 2018-05-18 DIAGNOSIS — R0989 Other specified symptoms and signs involving the circulatory and respiratory systems: ICD-10-CM

## 2018-05-18 DIAGNOSIS — Z9289 Personal history of other medical treatment: ICD-10-CM

## 2018-05-18 DIAGNOSIS — I4891 Unspecified atrial fibrillation: ICD-10-CM

## 2018-05-18 DIAGNOSIS — C4490 Unspecified malignant neoplasm of skin, unspecified: ICD-10-CM

## 2018-05-18 DIAGNOSIS — N184 Chronic kidney disease, stage 4 (severe): ICD-10-CM

## 2018-05-18 DIAGNOSIS — I10 Essential (primary) hypertension: ICD-10-CM

## 2018-05-18 DIAGNOSIS — R600 Localized edema: ICD-10-CM

## 2018-05-18 DIAGNOSIS — I5032 Chronic diastolic (congestive) heart failure: ICD-10-CM

## 2018-05-18 DIAGNOSIS — J302 Other seasonal allergic rhinitis: ICD-10-CM

## 2018-05-18 DIAGNOSIS — I350 Nonrheumatic aortic (valve) stenosis: ICD-10-CM

## 2018-05-18 MED ORDER — FUROSEMIDE 40 MG PO TAB
40 mg | ORAL_TABLET | Freq: Two times a day (BID) | ORAL | 1 refills | 90.00000 days | Status: AC
Start: 2018-05-18 — End: 2018-12-07

## 2018-05-19 ENCOUNTER — Encounter: Admit: 2018-05-19 | Discharge: 2018-05-19 | Payer: MEDICARE

## 2018-05-19 NOTE — Telephone Encounter
Left VM to CB and discuss.

## 2018-05-21 ENCOUNTER — Encounter: Admit: 2018-05-21 | Discharge: 2018-05-21 | Payer: MEDICARE

## 2018-05-21 DIAGNOSIS — I10 Essential (primary) hypertension: ICD-10-CM

## 2018-05-21 DIAGNOSIS — I480 Paroxysmal atrial fibrillation: ICD-10-CM

## 2018-05-21 DIAGNOSIS — E7800 Pure hypercholesterolemia, unspecified: Principal | ICD-10-CM

## 2018-05-21 DIAGNOSIS — I4819 Other persistent atrial fibrillation: ICD-10-CM

## 2018-05-21 LAB — BASIC METABOLIC PANEL
Lab: 116 g/dL — ABNORMAL HIGH (ref 70–105)
Lab: 137 MMOL/L (ref 3.5–5.1)
Lab: 15 U/L — ABNORMAL HIGH (ref 0–14)
Lab: 29 mg/dL (ref 7–25)
Lab: 30 g/dL (ref 3.5–5.0)
Lab: 4.5 MMOL/L (ref 98–110)
Lab: 9.6 mg/dL (ref 0.3–1.2)
Lab: 98 mg/dL (ref 70–100)

## 2018-05-28 ENCOUNTER — Encounter: Admit: 2018-05-28 | Discharge: 2018-05-28 | Payer: MEDICARE

## 2018-05-28 MED ORDER — XARELTO 15 MG PO TAB
ORAL_TABLET | Freq: Every day | ORAL | 11 refills | 30.00000 days | Status: AC
Start: 2018-05-28 — End: 2019-06-06

## 2018-06-16 ENCOUNTER — Encounter: Admit: 2018-06-16 | Discharge: 2018-06-16 | Payer: MEDICARE

## 2018-06-16 NOTE — Progress Notes
Records Request    Medical records request for continuation of care:    Patient has appointment on 06/18/2018   with  Dr. Wallene Huh* .    Please fax records to [facility]  [phone]    Request records: STAT      Most recent hospitalization/ Emergency Room Visit    H&P/ Discharge Summary    EKG's           Recent Cardiac Testing    Any cardiac-related records    Recent Labs        Thank you,      [facility]  [name]  [address]  [place], [place] [address]  Phone:  [phone]  Fax:  [phone]

## 2018-06-18 ENCOUNTER — Encounter: Admit: 2018-06-18 | Discharge: 2018-06-18 | Payer: MEDICARE

## 2018-06-18 ENCOUNTER — Ambulatory Visit: Admit: 2018-06-18 | Discharge: 2018-06-19 | Payer: MEDICARE

## 2018-06-18 DIAGNOSIS — I4819 Other persistent atrial fibrillation: Principal | ICD-10-CM

## 2018-06-18 DIAGNOSIS — C4490 Unspecified malignant neoplasm of skin, unspecified: ICD-10-CM

## 2018-06-18 DIAGNOSIS — E785 Hyperlipidemia, unspecified: ICD-10-CM

## 2018-06-18 DIAGNOSIS — I10 Essential (primary) hypertension: Secondary | ICD-10-CM

## 2018-06-18 DIAGNOSIS — N184 Chronic kidney disease, stage 4 (severe): ICD-10-CM

## 2018-06-18 DIAGNOSIS — J302 Other seasonal allergic rhinitis: ICD-10-CM

## 2018-06-18 DIAGNOSIS — I358 Other nonrheumatic aortic valve disorders: ICD-10-CM

## 2018-06-18 MED ORDER — DILTIAZEM HCL 30 MG PO TAB
30 mg | ORAL_TABLET | Freq: Three times a day (TID) | ORAL | 3 refills | 45.00000 days | Status: AC
Start: 2018-06-18 — End: 2019-09-11

## 2018-06-22 ENCOUNTER — Encounter: Admit: 2018-06-22 | Discharge: 2018-06-22 | Payer: MEDICARE

## 2018-06-23 ENCOUNTER — Ambulatory Visit: Admit: 2018-06-23 | Discharge: 2018-06-23 | Payer: MEDICARE

## 2018-06-23 ENCOUNTER — Encounter: Admit: 2018-06-23 | Discharge: 2018-06-23 | Payer: MEDICARE

## 2018-06-23 DIAGNOSIS — I4819 Other persistent atrial fibrillation: Principal | ICD-10-CM

## 2018-06-23 LAB — POC POTASSIUM: Lab: 4.8 MMOL/L (ref 3.5–5.1)

## 2018-06-23 MED ORDER — PROPOFOL INJ 10 MG/ML IV VIAL
0 refills | Status: DC
Start: 2018-06-23 — End: 2018-06-23
  Administered 2018-06-23: 14:00:00 50 mg via INTRAVENOUS
  Administered 2018-06-23: 14:00:00 20 mg via INTRAVENOUS

## 2018-06-23 MED ORDER — LIDOCAINE (PF) 200 MG/10 ML (2 %) IJ SYRG
0 refills | Status: DC
Start: 2018-06-23 — End: 2018-06-23
  Administered 2018-06-23: 14:00:00 100 mg via INTRAVENOUS

## 2018-06-23 MED ORDER — PHENYLEPHRINE IN 0.9% NACL(PF) 1 MG/10 ML (100 MCG/ML) IV SYRG
0 refills | Status: DC
Start: 2018-06-23 — End: 2018-06-23
  Administered 2018-06-23: 14:00:00 100 ug via INTRAVENOUS

## 2018-06-23 MED ORDER — SODIUM CHLORIDE 0.9 % IV SOLP (OR) 500ML
0 refills | Status: DC
Start: 2018-06-23 — End: 2018-06-23
  Administered 2018-06-23: 14:00:00 via INTRAVENOUS

## 2018-06-23 NOTE — Progress Notes
Care Plan   Care Category & Patient Outcome Goal Met Treatment/  Interventions Plan of the Day RN Name   Cardiovascular  Hemodynamic stability and adequate peripheral perfusion.   Yes   Refer to  patient's chart. Monitor VS per sedation standard. Monitor ECG continuously.  Assess/maintain IV patency.   Garnett Farm, BSN   Respiratory  Patent airway, ease of respiration, and adequate oxygenation.   Yes   Refer to  patient's chart. Maintain open airway. Assess respirations. Monitor O2 saturations. Titrate O2 to keep sat>= 95% or baseline.   Garnett Farm, BSN   Psychological/Emotional/Spiritual  Cope with procedure with support in place.  Spiritual needs are addressed.     Yes   Refer to  patient's chart. Provide adequate and thorough instructions.  Provide a caring and supportive environment.  Communicate patient???s concerns with other members of the health team.   Garnett Farm, BSN   Pain  Patient???s pain goal met.   Yes   Refer to  patient's chart. Prepare patient for potentially uncomfortable procedure.  Observe for verbal/nonverbal complaints of pain.  Assess pain.  Provide comfort measures.   Garnett Farm, BSN   Safety/Fall Risk  Free from injury, security maintained.   Yes High Risk    Refer to  patient's chart.   Follow nursing standard of practice for high risks fall patients.   Garnett Farm, BSN   Knowledge Base  Verbalize understanding of procedure/information provided.   Yes   Refer to  patient's chart. Describe the procedure along with what symptoms to expect.  Evaluate patient???s understanding of procedure.  Encourage patient to ask questions.  Provide additional information as needed.   Garnett Farm, BSN

## 2018-06-23 NOTE — Patient Instructions
???   Cardioversion is usually short term (temporary). You may need it repeated if the abnormal heart rhythm returns. After the procedure, your healthcare provider will tell you if the treatment worked or if you will need further treatments or medicine.    Follow-up care  Make a follow-up appointment, or as advised.  When to call your healthcare provider  Call 911???right away if you have:  ??? Chest pain  ??? Shortness of breath  ??? Loss of vision, speech, or strength or coordination in any body part  Call your healthcare provider right away if you:  ??? Feel faint,???dizzy, or lightheaded  ??? Have chest pain with increased activity  ??? Have irregular heartbeat or fast pulse  ??? Have bleeding issues from blood-thinning medicines  [employer]Well last reviewed this educational content on 07/29/2017  ??? 2000-2020 [employer], [place]. [address], [place], [place] [address]. All rights reserved. This information is not intended as a substitute for professional medical care. Always follow your healthcare professional's instructions.

## 2018-06-29 ENCOUNTER — Encounter: Admit: 2018-06-29 | Discharge: 2018-06-29 | Payer: MEDICARE

## 2018-06-30 MED ORDER — ATORVASTATIN 20 MG PO TAB
ORAL_TABLET | Freq: Every day | 3 refills | Status: AC
Start: 2018-06-30 — End: 2019-09-11

## 2018-08-26 ENCOUNTER — Encounter: Admit: 2018-08-26 | Discharge: 2018-08-26 | Payer: MEDICARE

## 2018-08-26 ENCOUNTER — Ambulatory Visit: Admit: 2018-08-26 | Discharge: 2018-08-27 | Payer: MEDICARE

## 2018-08-26 DIAGNOSIS — E785 Hyperlipidemia, unspecified: ICD-10-CM

## 2018-08-26 DIAGNOSIS — I358 Other nonrheumatic aortic valve disorders: ICD-10-CM

## 2018-08-26 DIAGNOSIS — I10 Essential (primary) hypertension: Principal | ICD-10-CM

## 2018-08-26 DIAGNOSIS — I4891 Unspecified atrial fibrillation: ICD-10-CM

## 2018-08-26 DIAGNOSIS — R600 Localized edema: ICD-10-CM

## 2018-08-26 DIAGNOSIS — Z9889 Other specified postprocedural states: Principal | ICD-10-CM

## 2018-08-26 DIAGNOSIS — I472 Ventricular tachycardia: ICD-10-CM

## 2018-08-26 DIAGNOSIS — R0989 Other specified symptoms and signs involving the circulatory and respiratory systems: ICD-10-CM

## 2018-08-26 DIAGNOSIS — J302 Other seasonal allergic rhinitis: ICD-10-CM

## 2018-08-26 DIAGNOSIS — I5032 Chronic diastolic (congestive) heart failure: ICD-10-CM

## 2018-08-26 DIAGNOSIS — R002 Palpitations: ICD-10-CM

## 2018-08-26 DIAGNOSIS — C4490 Unspecified malignant neoplasm of skin, unspecified: ICD-10-CM

## 2018-08-26 DIAGNOSIS — I4819 Other persistent atrial fibrillation: ICD-10-CM

## 2018-08-26 DIAGNOSIS — N184 Chronic kidney disease, stage 4 (severe): ICD-10-CM

## 2018-08-26 DIAGNOSIS — I350 Nonrheumatic aortic (valve) stenosis: ICD-10-CM

## 2018-09-20 ENCOUNTER — Encounter: Admit: 2018-09-20 | Discharge: 2018-09-20

## 2018-09-20 ENCOUNTER — Ambulatory Visit: Admit: 2018-09-20 | Discharge: 2018-09-21

## 2018-09-20 DIAGNOSIS — I10 Essential (primary) hypertension: Principal | ICD-10-CM

## 2018-09-20 DIAGNOSIS — I472 Ventricular tachycardia: Secondary | ICD-10-CM

## 2018-09-20 DIAGNOSIS — E785 Hyperlipidemia, unspecified: Secondary | ICD-10-CM

## 2018-09-20 DIAGNOSIS — C4490 Unspecified malignant neoplasm of skin, unspecified: Secondary | ICD-10-CM

## 2018-09-20 DIAGNOSIS — I358 Other nonrheumatic aortic valve disorders: Secondary | ICD-10-CM

## 2018-09-20 DIAGNOSIS — J302 Other seasonal allergic rhinitis: Secondary | ICD-10-CM

## 2018-09-20 DIAGNOSIS — I4819 Other persistent atrial fibrillation: Secondary | ICD-10-CM

## 2018-09-20 DIAGNOSIS — Z9889 Other specified postprocedural states: Secondary | ICD-10-CM

## 2018-09-24 ENCOUNTER — Encounter: Admit: 2018-09-24 | Discharge: 2018-09-24

## 2018-12-04 ENCOUNTER — Encounter: Admit: 2018-12-04 | Discharge: 2018-12-04

## 2018-12-06 MED ORDER — CANDESARTAN 32 MG PO TAB
ORAL_TABLET | Freq: Every day | ORAL | 2 refills | Status: DC
Start: 2018-12-06 — End: 2019-07-25

## 2018-12-07 MED ORDER — FUROSEMIDE 40 MG PO TAB
ORAL_TABLET | Freq: Two times a day (BID) | ORAL | 3 refills | 90.00000 days | Status: DC
Start: 2018-12-07 — End: 2019-04-26

## 2018-12-30 ENCOUNTER — Encounter: Admit: 2018-12-30 | Discharge: 2018-12-30 | Payer: MEDICARE

## 2019-01-13 ENCOUNTER — Encounter: Admit: 2019-01-13 | Discharge: 2019-01-13 | Payer: MEDICARE

## 2019-01-13 DIAGNOSIS — I10 Essential (primary) hypertension: Secondary | ICD-10-CM

## 2019-01-13 DIAGNOSIS — T887XXA Unspecified adverse effect of drug or medicament, initial encounter: Secondary | ICD-10-CM

## 2019-01-13 DIAGNOSIS — I4819 Other persistent atrial fibrillation: Secondary | ICD-10-CM

## 2019-01-13 DIAGNOSIS — N184 Chronic kidney disease, stage 4 (severe): Secondary | ICD-10-CM

## 2019-01-13 DIAGNOSIS — R002 Palpitations: Secondary | ICD-10-CM

## 2019-01-13 DIAGNOSIS — C4490 Unspecified malignant neoplasm of skin, unspecified: Secondary | ICD-10-CM

## 2019-01-13 DIAGNOSIS — I472 Ventricular tachycardia: Secondary | ICD-10-CM

## 2019-01-13 DIAGNOSIS — E785 Hyperlipidemia, unspecified: Secondary | ICD-10-CM

## 2019-01-13 DIAGNOSIS — I358 Other nonrheumatic aortic valve disorders: Secondary | ICD-10-CM

## 2019-01-13 DIAGNOSIS — J302 Other seasonal allergic rhinitis: Secondary | ICD-10-CM

## 2019-01-13 DIAGNOSIS — I5032 Chronic diastolic (congestive) heart failure: Secondary | ICD-10-CM

## 2019-01-13 DIAGNOSIS — Z9889 Other specified postprocedural states: Secondary | ICD-10-CM

## 2019-01-13 DIAGNOSIS — I350 Nonrheumatic aortic (valve) stenosis: Secondary | ICD-10-CM

## 2019-01-13 NOTE — Patient Instructions
Thank you for visiting our office today.      Otherwise continue the same medications as you have been doing.          We will be pursuing the following tests after your appointment today:       Orders Placed This Encounter    ECG Today (all locations)    2D + DOPPLER ECHO         We will plan to see you back in 6 months.  Please call us in the meantime with any questions or concerns.        Please allow 5-7 business days for our providers to review your results. All normal results will go to MyChart. If you do not have Mychart, it is strongly recommended to get this so you can easily view all your results. If you do not have mychart, we will attempt to call you once with normal lab and testing results. If we cannot reach you by phone with normal results, we will send you a letter.  If you have not heard the results of your testing after one week please give [place] a call.       Your Cardiovascular Medicine [place] Team Richardson Landry, Rene Kocher and Ulen)  phone number is [phone].

## 2019-01-26 ENCOUNTER — Encounter: Admit: 2019-01-26 | Discharge: 2019-01-26 | Payer: MEDICARE

## 2019-01-26 ENCOUNTER — Ambulatory Visit: Admit: 2019-01-26 | Discharge: 2019-01-26 | Payer: MEDICARE

## 2019-01-26 DIAGNOSIS — E785 Hyperlipidemia, unspecified: Secondary | ICD-10-CM

## 2019-01-26 DIAGNOSIS — I358 Other nonrheumatic aortic valve disorders: Secondary | ICD-10-CM

## 2019-01-26 DIAGNOSIS — R002 Palpitations: Secondary | ICD-10-CM

## 2019-01-26 DIAGNOSIS — I10 Essential (primary) hypertension: Secondary | ICD-10-CM

## 2019-03-02 ENCOUNTER — Encounter: Admit: 2019-03-02 | Discharge: 2019-03-02 | Payer: MEDICARE

## 2019-03-02 ENCOUNTER — Ambulatory Visit: Admit: 2019-03-02 | Discharge: 2019-03-02 | Payer: MEDICARE

## 2019-03-02 DIAGNOSIS — R002 Palpitations: Secondary | ICD-10-CM

## 2019-03-02 DIAGNOSIS — I10 Essential (primary) hypertension: Secondary | ICD-10-CM

## 2019-03-02 DIAGNOSIS — I480 Paroxysmal atrial fibrillation: Secondary | ICD-10-CM

## 2019-03-02 DIAGNOSIS — I358 Other nonrheumatic aortic valve disorders: Secondary | ICD-10-CM

## 2019-03-02 DIAGNOSIS — C4490 Unspecified malignant neoplasm of skin, unspecified: Secondary | ICD-10-CM

## 2019-03-02 DIAGNOSIS — E785 Hyperlipidemia, unspecified: Secondary | ICD-10-CM

## 2019-03-02 DIAGNOSIS — Z20828 Contact with and (suspected) exposure to other viral communicable diseases: Secondary | ICD-10-CM

## 2019-03-02 DIAGNOSIS — J302 Other seasonal allergic rhinitis: Secondary | ICD-10-CM

## 2019-03-05 ENCOUNTER — Encounter: Admit: 2019-03-05 | Discharge: 2019-03-05 | Payer: MEDICARE

## 2019-03-05 DIAGNOSIS — E785 Hyperlipidemia, unspecified: Secondary | ICD-10-CM

## 2019-03-05 DIAGNOSIS — J302 Other seasonal allergic rhinitis: Secondary | ICD-10-CM

## 2019-03-05 DIAGNOSIS — I358 Other nonrheumatic aortic valve disorders: Secondary | ICD-10-CM

## 2019-03-05 DIAGNOSIS — I10 Essential (primary) hypertension: Secondary | ICD-10-CM

## 2019-03-05 DIAGNOSIS — C4490 Unspecified malignant neoplasm of skin, unspecified: Secondary | ICD-10-CM

## 2019-03-11 ENCOUNTER — Encounter: Admit: 2019-03-11 | Discharge: 2019-03-11 | Payer: MEDICARE

## 2019-03-11 DIAGNOSIS — Z20828 Contact with and (suspected) exposure to other viral communicable diseases: Secondary | ICD-10-CM

## 2019-03-11 LAB — COVID-19 (SARS-COV-2) PCR

## 2019-03-14 ENCOUNTER — Encounter: Admit: 2019-03-14 | Discharge: 2019-03-14 | Payer: MEDICARE

## 2019-03-14 NOTE — Patient Instructions
Called and left VM to return call to [phone] for instructions for DCCV

## 2019-03-15 ENCOUNTER — Encounter: Admit: 2019-03-15 | Discharge: 2019-03-15 | Payer: MEDICARE

## 2019-03-15 NOTE — Patient Instructions
called and left VM to have nothing to eat after midnight tonight, no OTC medications or supplements, please take Xarelto this evening, arrive at 12:45, and to return call to [phone] with any further questions.

## 2019-03-16 ENCOUNTER — Ambulatory Visit: Admit: 2019-03-16 | Discharge: 2019-03-16 | Payer: MEDICARE

## 2019-03-16 ENCOUNTER — Encounter: Admit: 2019-03-16 | Discharge: 2019-03-16 | Payer: MEDICARE

## 2019-03-16 DIAGNOSIS — R002 Palpitations: Secondary | ICD-10-CM

## 2019-03-16 DIAGNOSIS — I480 Paroxysmal atrial fibrillation: Secondary | ICD-10-CM

## 2019-03-16 DIAGNOSIS — I10 Essential (primary) hypertension: Secondary | ICD-10-CM

## 2019-03-16 LAB — POC POTASSIUM: Lab: 4.2 MMOL/L (ref 3.5–5.1)

## 2019-03-16 MED ORDER — PROPOFOL INJ 10 MG/ML IV VIAL
0 refills | Status: DC
Start: 2019-03-16 — End: 2019-03-16

## 2019-03-16 MED ORDER — LIDOCAINE (PF) 200 MG/10 ML (2 %) IJ SYRG
0 refills | Status: DC
Start: 2019-03-16 — End: 2019-03-16
  Administered 2019-03-16: 20:00:00 60 mg via INTRAVENOUS

## 2019-03-16 MED ORDER — SODIUM CHLORIDE 0.9 % IV SOLP (OR) 500ML
0 refills | Status: DC
Start: 2019-03-16 — End: 2019-03-16
  Administered 2019-03-16: 20:00:00 via INTRAVENOUS

## 2019-03-16 NOTE — Other
Pre-Operative Assessment for TEE or Cardioversion    Date of Service:  03/16/2019  Ray [name] is a 83 y.o. y.o. male.     DOB: 07/04/1935                  MRN#:  [id number]      History and Physical Exam    I reviewed H&P from 03/02/19 .  No changes noted.    Assessment    I reviewed:    nurse pre-procedure assessment (including past medical history, allergies, medications, labs)    NPO status Acceptable    anesthetic/sedation history and noted no abnormalities  I assessed the patient's airways and noted:     no abnormalities     head and neck: no abnormalities noted     mouth: no abnormalities noted     mallampati: II (soft palate, uvula, fauces visible)  I requested Anesthesia consult  I discussed risks and alternatives of this type of sedation and the procedure      with the patient    Physical Status Classification (American Society of Anesthesiologists):   Per Anesthesia    Sedation/Medication Plan    Sedation plan per Anesthesiology

## 2019-03-16 NOTE — Progress Notes
Care Plan   Care Category & Patient Outcome Goal Met Treatment/  Interventions Plan of the Day RN Name   Cardiovascular  Hemodynamic stability and adequate peripheral perfusion.   Yes   Refer to  patient's chart. Monitor VS per sedation standard. Monitor ECG continuously.  Assess/maintain IV patency.   Austyn Perriello, RN   Respiratory  Patent airway, ease of respiration, and adequate oxygenation.   Yes   Refer to  patient's chart. Maintain open airway. Assess respirations. Monitor O2 saturations. Titrate O2 to keep sat>= 95% or baseline.   Annaclaire Walsworth, RN   Psychological/Emotional/Spiritual  Cope with procedure with support in place.  Spiritual needs are addressed.     Yes   Refer to  patient's chart. Provide adequate and thorough instructions.  Provide a caring and supportive environment.  Communicate patients concerns with other members of the health team.   Derryck Shahan, RN   Pain  Patients pain goal met.   Yes   Refer to  patient's chart. Prepare patient for potentially uncomfortable procedure.  Observe for verbal/nonverbal complaints of pain.  Assess pain.  Provide comfort measures.   Willoughby Doell, RN   Safety/Fall Risk  Free from injury, security maintained.   Yes High Risk    Refer to  patient's chart.   Follow nursing standard of practice for high risks fall patients.   Zulema Pulaski, RN   Knowledge Base  Verbalize understanding of procedure/information provided.   Yes   Refer to  patient's chart. Describe the procedure along with what symptoms to expect.  Evaluate patients understanding of procedure.  Encourage patient to ask questions.  Provide additional information as needed.   Chozen Latulippe, RN

## 2019-03-16 NOTE — Anesthesia Post-Procedure Evaluation
Post-Anesthesia Evaluation    Name: DIQUAN KASSIS      MRN: [id number]     DOB: [birth year]     Age: 83 y.o.     Sex: male   __________________________________________________________________________     Procedure Information     Anesthesia Start Date/Time: 03/16/19 1359    Scheduled providers: Maurice Small, MD    Procedure: CARDIOVERSION, EXTERNAL    Location: [facility]  BP: 116/52 (12/16 1442)  Pulse: 87 (12/16 1442)  Respirations: 18 PER MINUTE (12/16 1442)  SpO2: 98 % (12/16 1442)   Vitals Value Taken Time   BP 116/52 03/16/19 1442   Temp     Pulse 87 03/16/19 1442   Respirations 18 PER MINUTE 03/16/19 1442   SpO2 98 % 03/16/19 1442         Post Anesthesia Evaluation Note    Evaluation location: Pre/Post  Patient participation: recovered; patient participated in evaluation  Level of consciousness: alert    Pain score: 0  Pain management: adequate    Hydration: normovolemia  Temperature: 36.0C - 38.4C  Airway patency: adequate    Perioperative Events       Post-op nausea and vomiting: no PONV    Postoperative Status  Cardiovascular status: hemodynamically stable  Respiratory status: spontaneous ventilation  Follow-up needed: none        Perioperative Events  Perioperative Event: No  Emergency Case Activation: No

## 2019-03-16 NOTE — Patient Instructions
CARDIOLOGY PROCEDURES           POST SEDATION INSTRUCTIONS      Patient Name: Ray [name]  MRN#: [id number]  Date: 03/16/2019      Please follow the instructions listed below:   Please have somone accompany you, as you should not dirve today.      ? There may be some residual effects from the sedatives during the procedure.  ? Do not drive a vehicle for up to 24 hours after receiving sedation.  ? Do not operate heavy or potentially harmful equipment  ? Do not make legally binding decisions  ? Do not drink alcohol for up to 24 hours  ? Do not communicate through social media for 24 hours.       If you have question or concerns about this procedure, please contact the Cardiology office at [phone], and ask to speak to one of the nurses.      Current Medications List:  ? atorvastatin (LIPITOR) 20 mg tablet TAKE 1 TABLET EVERY DAY   ? candesartan (ATACAND) 32 mg tablet Take 1 tablet by mouth once daily   ? dilTIAZem HCL (CARDIZEM) 30 mg tablet Take one tablet by mouth three times daily. (Patient taking differently: Take 30 mg by mouth twice daily.)   ? esomeprazole DR(+) (NEXIUM) 20 mg capsule Take 20 mg by mouth daily. Take on an empty stomach at least 1 hour before or 2 hours after food.   ? ferrous sulfate (FEOSOL) 325 mg (65 mg iron) tablet Take 325 mg by mouth twice daily. Take on an empty stomach at least 1 hour before or 2 hours after food.    ? fish oil /omega-3 fatty acids (SEA-OMEGA) 340/1000 mg capsule Take 1 capsule by mouth daily.   ? fluticasone/salmeterol (ADVAIR) 100/50 mcg inhalation disk Inhale 1 Puff by mouth daily.   ? furosemide (LASIX) 40 mg tablet Take 1 tablet by mouth twice daily   ? lactobacillus rhamnosus (GG) (CULTURELLE) 10 billion cell cap Take 1 capsule by mouth daily with breakfast.   ? melatonin 5 mg tab Take one tablet by mouth at bedtime as needed. ? potassium chloride SR (K-DUR) 20 mEq tablet Starting 12/18/17 take 2 tablets ( ) daily for 7 days. On 12/25/17 start taking 1 tablet DAILY.Take with a meal and a full glass of water (Patient taking differently: Take 20 mEq by mouth daily. Starting 12/18/17 take 2 tablets ( ) daily for 7 days. On 12/25/17 start taking 1 tablet DAILY.Take with a meal and a full glass of water)   ? sildenafiL (VIAGRA) 100 mg tablet TAKE 1 TABLET BY MOUTH ONCE DAILY AS NEEDED FOR SEXUAL ACTIVITY   ? VENTOLIN HFA 90 mcg/actuation inhaler Inhale 1 puff by mouth into the lungs as Needed.   ? vitamins, B complex tab Take 1 Tab by mouth daily.   ? XARELTO 15 mg tablet Take 1 tablet by mouth once daily with food         Instructions Given To: Patient    Instructions Given By: Hilbert Corrigan, RN

## 2019-04-20 NOTE — Progress Notes
Left message for Ray [name] regarding cardiac clearance for carpal tunnel release.  Called Ray [name] to check to see if Ray [name] is having any cardiac sx.  Plan to discuss with [facility] and fax clearance to dr. Maple Hudson office.

## 2019-04-26 ENCOUNTER — Encounter: Admit: 2019-04-26 | Discharge: 2019-04-26 | Payer: MEDICARE

## 2019-04-26 MED ORDER — FUROSEMIDE 40 MG PO TAB
40 mg | ORAL_TABLET | Freq: Two times a day (BID) | ORAL | 3 refills | 90.00000 days | Status: DC
Start: 2019-04-26 — End: 2019-05-30

## 2019-05-30 ENCOUNTER — Encounter: Admit: 2019-05-30 | Discharge: 2019-05-30 | Payer: MEDICARE

## 2019-05-30 DIAGNOSIS — R0602 Shortness of breath: Secondary | ICD-10-CM

## 2019-05-30 DIAGNOSIS — I10 Essential (primary) hypertension: Secondary | ICD-10-CM

## 2019-05-30 DIAGNOSIS — C4490 Unspecified malignant neoplasm of skin, unspecified: Secondary | ICD-10-CM

## 2019-05-30 DIAGNOSIS — N184 Chronic kidney disease, stage 4 (severe): Secondary | ICD-10-CM

## 2019-05-30 DIAGNOSIS — I358 Other nonrheumatic aortic valve disorders: Secondary | ICD-10-CM

## 2019-05-30 DIAGNOSIS — E785 Hyperlipidemia, unspecified: Secondary | ICD-10-CM

## 2019-05-30 DIAGNOSIS — I4819 Other persistent atrial fibrillation: Secondary | ICD-10-CM

## 2019-05-30 DIAGNOSIS — R002 Palpitations: Secondary | ICD-10-CM

## 2019-05-30 DIAGNOSIS — J302 Other seasonal allergic rhinitis: Secondary | ICD-10-CM

## 2019-05-30 MED ORDER — FUROSEMIDE 40 MG PO TAB
40 mg | ORAL_TABLET | Freq: Two times a day (BID) | ORAL | 3 refills | 90.00000 days | Status: DC
Start: 2019-05-30 — End: 2019-07-28

## 2019-05-31 ENCOUNTER — Encounter: Admit: 2019-05-31 | Discharge: 2019-05-31 | Payer: MEDICARE

## 2019-05-31 NOTE — Telephone Encounter
-----   Message from Lincoln sent at 05/31/2019  2:24 PM CST -----  Regarding: Tikosyn Pricing   Tikosyn 125MCG BID  30 day: $  90 Day: $       Dofetilide 125MCG BID   30 Day: $42.58, Retail  90 Day: $127.25, Mail

## 2019-06-04 ENCOUNTER — Encounter: Admit: 2019-06-04 | Discharge: 2019-06-04 | Payer: MEDICARE

## 2019-06-06 MED ORDER — XARELTO 15 MG PO TAB
ORAL_TABLET | Freq: Every day | ORAL | 11 refills | 30.00000 days | Status: AC
Start: 2019-06-06 — End: ?

## 2019-06-07 ENCOUNTER — Encounter: Admit: 2019-06-07 | Discharge: 2019-06-07 | Payer: MEDICARE

## 2019-06-16 ENCOUNTER — Encounter: Admit: 2019-06-16 | Discharge: 2019-06-16 | Payer: MEDICARE

## 2019-06-27 ENCOUNTER — Encounter: Admit: 2019-06-27 | Discharge: 2019-06-27 | Payer: MEDICARE

## 2019-06-27 NOTE — Telephone Encounter
Left message on voice mail with date, time, location and instructions for Amyloid Scan.  Asked him to call back if he has any questions.  MyChart message also sent.

## 2019-06-28 ENCOUNTER — Encounter: Admit: 2019-06-28 | Discharge: 2019-06-28 | Payer: MEDICARE

## 2019-06-28 ENCOUNTER — Ambulatory Visit: Admit: 2019-06-28 | Discharge: 2019-06-28 | Payer: MEDICARE

## 2019-06-28 DIAGNOSIS — N1832 Stage 3b chronic kidney disease (HCC): Secondary | ICD-10-CM

## 2019-06-28 DIAGNOSIS — I10 Essential (primary) hypertension: Secondary | ICD-10-CM

## 2019-06-28 DIAGNOSIS — C4490 Unspecified malignant neoplasm of skin, unspecified: Secondary | ICD-10-CM

## 2019-06-28 DIAGNOSIS — E785 Hyperlipidemia, unspecified: Secondary | ICD-10-CM

## 2019-06-28 DIAGNOSIS — I4819 Other persistent atrial fibrillation: Secondary | ICD-10-CM

## 2019-06-28 DIAGNOSIS — N189 Chronic kidney disease, unspecified: Secondary | ICD-10-CM

## 2019-06-28 DIAGNOSIS — Z9889 Other specified postprocedural states: Secondary | ICD-10-CM

## 2019-06-28 DIAGNOSIS — R002 Palpitations: Secondary | ICD-10-CM

## 2019-06-28 DIAGNOSIS — I358 Other nonrheumatic aortic valve disorders: Secondary | ICD-10-CM

## 2019-06-28 DIAGNOSIS — J302 Other seasonal allergic rhinitis: Secondary | ICD-10-CM

## 2019-06-28 DIAGNOSIS — R0602 Shortness of breath: Secondary | ICD-10-CM

## 2019-06-28 LAB — PROTEIN/CR RATIO,UR RAN
Lab: 2 FL (ref 80–100)
Lab: 36 mg/dL — ABNORMAL LOW (ref 36–45)
Lab: 72 mg/dL — ABNORMAL LOW (ref 12.0–15.0)

## 2019-07-01 ENCOUNTER — Encounter: Admit: 2019-07-01 | Discharge: 2019-07-01 | Payer: MEDICARE

## 2019-07-04 ENCOUNTER — Encounter: Admit: 2019-07-04 | Discharge: 2019-07-04 | Payer: MEDICARE

## 2019-07-04 NOTE — Telephone Encounter
Scheduled 9/21 @ 2pm for 6 mo f/u.

## 2019-07-04 NOTE — Telephone Encounter
MyChart message sent to pt with results and 6 mo f/u appt date with times.

## 2019-07-04 NOTE — Telephone Encounter
-----   Message from Denita Lung, MD sent at 07/03/2019  8:05 PM CDT -----  Please let him know that urine test showed excess protein. I would like for him to get additional blood tests to see what is causing protein in his urine.  Please have him do them at his convenience. Also, can you move follow up to 6 months

## 2019-07-06 ENCOUNTER — Encounter: Admit: 2019-07-06 | Discharge: 2019-07-06 | Payer: MEDICARE

## 2019-07-25 MED ORDER — POTASSIUM CHLORIDE 10 MEQ PO TBTQ
10-60 meq | ORAL | 0 refills | Status: CN | PRN
Start: 2019-07-25 — End: ?

## 2019-07-25 MED ORDER — ALLOPURINOL 100 MG PO TAB
50 mg | ORAL_TABLET | Freq: Every day | ORAL | 1 refills | 60.00000 days | Status: DC
Start: 2019-07-25 — End: 2019-08-31

## 2019-07-25 MED ORDER — METHYLPREDNISOLONE 4 MG PO DSPK
ORAL_TABLET | 0 refills | Status: DC
Start: 2019-07-25 — End: 2019-08-31

## 2019-07-25 MED ORDER — FUROSEMIDE IV DRIP SYR (MAX CONC)
40-80 mg/h | INTRAVENOUS | 0 refills | Status: CN
Start: 2019-07-25 — End: ?

## 2019-07-25 MED ORDER — MAGNESIUM OXIDE 400 MG (241.3 MG MAGNESIUM) PO TAB
400 mg | Freq: Once | ORAL | 0 refills | Status: CN
Start: 2019-07-25 — End: ?

## 2019-07-25 MED ORDER — TAFAMIDIS 61 MG PO CAP
61 mg | ORAL_CAPSULE | Freq: Every day | ORAL | 11 refills | Status: DC
Start: 2019-07-25 — End: 2019-07-26

## 2019-07-25 MED ORDER — DOXYCYCLINE HYCLATE 100 MG PO CAP
100 mg | ORAL_CAPSULE | Freq: Two times a day (BID) | ORAL | 0 refills | 8.00000 days | Status: DC
Start: 2019-07-25 — End: 2019-08-31

## 2019-07-25 MED ORDER — FUROSEMIDE 10 MG/ML IJ SOLN
40-80 mg | INTRAVENOUS | 0 refills | Status: CN
Start: 2019-07-25 — End: ?

## 2019-07-26 MED ORDER — FUROSEMIDE IV DRIP SYR (MAX CONC)
40-80 mg/h | INTRAVENOUS | 0 refills | Status: CN
Start: 2019-07-26 — End: ?

## 2019-07-26 MED ORDER — TAFAMIDIS 61 MG PO CAP
61 mg | ORAL_CAPSULE | Freq: Every day | ORAL | 11 refills | Status: CN
Start: 2019-07-26 — End: ?

## 2019-07-26 MED ORDER — POTASSIUM CHLORIDE 10 MEQ PO TBTQ
10-60 meq | ORAL | 0 refills | Status: DC | PRN
Start: 2019-07-26 — End: 2019-07-26

## 2019-07-26 MED ORDER — FUROSEMIDE 10 MG/ML IJ SOLN
40-80 mg | INTRAVENOUS | 0 refills | Status: CN
Start: 2019-07-26 — End: ?

## 2019-07-26 MED ORDER — MAGNESIUM OXIDE 400 MG (241.3 MG MAGNESIUM) PO TAB
400 mg | Freq: Once | ORAL | 0 refills | Status: CN
Start: 2019-07-26 — End: ?

## 2019-07-26 MED ORDER — MAGNESIUM OXIDE 400 MG (241.3 MG MAGNESIUM) PO TAB
400 mg | Freq: Once | ORAL | 0 refills | Status: CP
Start: 2019-07-26 — End: ?
  Administered 2019-07-26: 17:00:00 400 mg via ORAL

## 2019-07-26 MED ORDER — FUROSEMIDE IV DRIP SYR (MAX CONC)
40-80 mg/h | INTRAVENOUS | 0 refills | Status: DC
Start: 2019-07-26 — End: 2019-07-26
  Administered 2019-07-26: 17:00:00 40 mg/h via INTRAVENOUS

## 2019-07-26 MED ORDER — POTASSIUM CHLORIDE 10 MEQ PO TBTQ
10-60 meq | ORAL | 0 refills | Status: CN | PRN
Start: 2019-07-26 — End: ?

## 2019-07-26 MED ORDER — FUROSEMIDE 10 MG/ML IJ SOLN
40-80 mg | INTRAVENOUS | 0 refills | Status: CP
Start: 2019-07-26 — End: ?
  Administered 2019-07-26 (×3): 40 mg via INTRAVENOUS

## 2019-07-27 MED ORDER — FUROSEMIDE 10 MG/ML IJ SOLN
40-80 mg | INTRAVENOUS | 0 refills | Status: CP
Start: 2019-07-27 — End: ?
  Administered 2019-07-27: 15:00:00 40 mg via INTRAVENOUS
  Administered 2019-07-27: 16:00:00 80 mg via INTRAVENOUS
  Administered 2019-07-27: 14:00:00 40 mg via INTRAVENOUS

## 2019-07-27 MED ORDER — FUROSEMIDE IV DRIP SYR (MAX CONC)
40-80 mg/h | INTRAVENOUS | 0 refills | Status: DC
Start: 2019-07-27 — End: 2019-07-27
  Administered 2019-07-27: 14:00:00 40 mg/h via INTRAVENOUS

## 2019-07-27 MED ORDER — FUROSEMIDE IV DRIP SYR (MAX CONC)
40-80 mg/h | INTRAVENOUS | 0 refills | Status: CN
Start: 2019-07-27 — End: ?

## 2019-07-27 MED ORDER — FUROSEMIDE 10 MG/ML IJ SOLN
40-80 mg | INTRAVENOUS | 0 refills | Status: CN
Start: 2019-07-27 — End: ?

## 2019-07-27 MED ORDER — POTASSIUM CHLORIDE 10 MEQ PO TBTQ
10-60 meq | ORAL | 0 refills | Status: CN | PRN
Start: 2019-07-27 — End: ?

## 2019-07-27 MED ORDER — MAGNESIUM OXIDE 400 MG (241.3 MG MAGNESIUM) PO TAB
400 mg | Freq: Once | ORAL | 0 refills | Status: CP
Start: 2019-07-27 — End: ?
  Administered 2019-07-27: 14:00:00 400 mg via ORAL

## 2019-07-27 MED ORDER — MAGNESIUM OXIDE 400 MG (241.3 MG MAGNESIUM) PO TAB
400 mg | Freq: Once | ORAL | 0 refills | Status: CN
Start: 2019-07-27 — End: ?

## 2019-07-27 MED ORDER — POTASSIUM CHLORIDE 10 MEQ PO TBTQ
10-60 meq | ORAL | 0 refills | Status: DC | PRN
Start: 2019-07-27 — End: 2019-07-27

## 2019-07-28 MED ORDER — BUMETANIDE 1 MG PO TAB
1 mg | ORAL_TABLET | Freq: Two times a day (BID) | ORAL | 1 refills | Status: DC
Start: 2019-07-28 — End: 2019-09-11

## 2019-07-28 MED ORDER — FUROSEMIDE 10 MG/ML IJ SOLN
40-80 mg | INTRAVENOUS | 0 refills | Status: CN
Start: 2019-07-28 — End: ?

## 2019-07-28 MED ORDER — FUROSEMIDE IV DRIP SYR (MAX CONC)
40-80 mg/h | INTRAVENOUS | 0 refills | Status: DC
Start: 2019-07-28 — End: 2019-07-28

## 2019-07-28 MED ORDER — FUROSEMIDE 10 MG/ML IJ SOLN
40-80 mg | INTRAVENOUS | 0 refills | Status: AC
Start: 2019-07-28 — End: ?

## 2019-07-28 MED ORDER — POTASSIUM CHLORIDE 10 MEQ PO TBTQ
10-60 meq | ORAL | 0 refills | Status: DC | PRN
Start: 2019-07-28 — End: 2019-07-28

## 2019-07-28 MED ORDER — MAGNESIUM OXIDE 400 MG (241.3 MG MAGNESIUM) PO TAB
400 mg | Freq: Once | ORAL | 0 refills | Status: DC
Start: 2019-07-28 — End: 2019-07-28

## 2019-07-28 MED ORDER — POTASSIUM CHLORIDE 10 MEQ PO TBTQ
10-60 meq | ORAL | 0 refills | Status: CN | PRN
Start: 2019-07-28 — End: ?

## 2019-07-28 MED ORDER — FUROSEMIDE IV DRIP SYR (MAX CONC)
40-80 mg/h | INTRAVENOUS | 0 refills | Status: CN
Start: 2019-07-28 — End: ?

## 2019-07-28 MED ORDER — MAGNESIUM OXIDE 400 MG (241.3 MG MAGNESIUM) PO TAB
400 mg | Freq: Once | ORAL | 0 refills | Status: CN
Start: 2019-07-28 — End: ?

## 2019-07-28 MED ORDER — BUMETANIDE 0.5 MG PO TAB
1 mg | Freq: Two times a day (BID) | ORAL | 0 refills | Status: DC
Start: 2019-07-28 — End: 2019-07-28

## 2019-08-01 ENCOUNTER — Encounter: Admit: 2019-08-01 | Discharge: 2019-08-01 | Payer: MEDICARE

## 2019-08-01 NOTE — Telephone Encounter
Pt called reporting worsening cramping. Called to assess and LM to cb.

## 2019-08-02 ENCOUNTER — Encounter: Admit: 2019-08-02 | Discharge: 2019-08-02 | Payer: MEDICARE

## 2019-08-02 NOTE — Progress Notes
The specialty pharmacy has been unable to contact Ray [name] after 3 attempts regarding his specialty medication, Vyndamax.  Left a voicemail asking the patient to call the specialty pharmacy at [phone].    The patient will be removed from the specialty pharmacy refill management program. The patient may be re-enrolled at any time by contacting the specialty pharmacy.    Shelva Majestic  Pharmacy Patient Advocate  [phone]

## 2019-08-03 ENCOUNTER — Encounter: Admit: 2019-08-03 | Discharge: 2019-08-03 | Payer: MEDICARE

## 2019-08-04 ENCOUNTER — Encounter: Admit: 2019-08-04 | Discharge: 2019-08-04 | Payer: MEDICARE

## 2019-08-04 NOTE — Progress Notes
have him get pending urine immunofixation lab. He also still needs a baseline 6 min walk and KCCQ12. At appt 08/16/19 with Dr [name]

## 2019-08-05 ENCOUNTER — Encounter: Admit: 2019-08-05 | Discharge: 2019-08-05 | Payer: MEDICARE

## 2019-08-05 NOTE — Progress Notes
Called Ray [name] to discuss assistance regarding their medication: Vyndamax.  Left voicemail asking patient to return call to the specialty pharmacy.  Will call patient again in 2 business days if no call back received.Shelva Majestic, CPhT  Pharmacy Patient Advocate, Specialty Pharmacy  [phone]    *Need to know if pt prefers application be mailed, emailed or faxed for signature and income documents.

## 2019-08-16 ENCOUNTER — Encounter: Admit: 2019-08-16 | Discharge: 2019-08-16 | Payer: MEDICARE

## 2019-08-16 ENCOUNTER — Ambulatory Visit: Admit: 2019-08-16 | Discharge: 2019-08-16 | Payer: MEDICARE

## 2019-08-16 DIAGNOSIS — E785 Hyperlipidemia, unspecified: Secondary | ICD-10-CM

## 2019-08-16 DIAGNOSIS — I10 Essential (primary) hypertension: Secondary | ICD-10-CM

## 2019-08-16 DIAGNOSIS — C4490 Unspecified malignant neoplasm of skin, unspecified: Secondary | ICD-10-CM

## 2019-08-16 DIAGNOSIS — I5030 Unspecified diastolic (congestive) heart failure: Secondary | ICD-10-CM

## 2019-08-16 DIAGNOSIS — I5032 Chronic diastolic (congestive) heart failure: Secondary | ICD-10-CM

## 2019-08-16 DIAGNOSIS — E8582 Wild-type transthyretin-related (ATTR) amyloidosis: Secondary | ICD-10-CM

## 2019-08-16 DIAGNOSIS — N1832 Stage 3b chronic kidney disease (HCC): Secondary | ICD-10-CM

## 2019-08-16 DIAGNOSIS — I4819 Other persistent atrial fibrillation: Secondary | ICD-10-CM

## 2019-08-16 DIAGNOSIS — I5033 Acute on chronic diastolic (congestive) heart failure: Secondary | ICD-10-CM

## 2019-08-16 DIAGNOSIS — J302 Other seasonal allergic rhinitis: Secondary | ICD-10-CM

## 2019-08-16 DIAGNOSIS — I358 Other nonrheumatic aortic valve disorders: Secondary | ICD-10-CM

## 2019-08-16 LAB — BASIC METABOLIC PANEL
Lab: 1.6 mg/dL — ABNORMAL HIGH (ref 0.4–1.24)
Lab: 103 MMOL/L (ref 98–110)
Lab: 113 mg/dL — ABNORMAL HIGH (ref 70–100)
Lab: 140 MMOL/L (ref 137–147)
Lab: 26 MMOL/L (ref 21–30)
Lab: 3.7 MMOL/L (ref 3.5–5.1)
Lab: 41 mL/min — ABNORMAL LOW (ref >60)
Lab: 49 mL/min — ABNORMAL LOW (ref >60)
Lab: 52 mg/dL — ABNORMAL HIGH (ref 7–25)
Lab: 9.5 mg/dL (ref 8.5–10.6)
Lab: 11 (ref 3–12)

## 2019-08-16 LAB — MAGNESIUM: Lab: 1.9 mg/dL (ref 1.6–2.6)

## 2019-08-16 NOTE — Telephone Encounter
-----   Message from Leonides Sake, APRN-NP sent at 08/16/2019  4:45 PM CDT -----  Please have him increase his bumex to 2mg  po BID for 3 days and then reduce back to 1mg  BID.  Increase his k+ to daily.  Start mag ox 400mg  daily.  Repeat BMP in one week.  Encourage him to elevate his legs as much as possible.      Diannia Ruder    ----- Message -----  From: Newt Minion, RN  Sent: 08/16/2019   4:04 PM CDT  To: Leonides Sake, APRN-NP    Labs for your review from OV today. Next OV with Annabelle Harman 6/3. Thank you.   ----- Message -----  From: Fredricka Bonine, APRN-NP  Sent: 08/16/2019   3:42 PM CDT  To: Cvm Nurse Hf Team [place]    Patient was seen in clinic today by [name], APRN.  Will defer to her.

## 2019-08-16 NOTE — Progress Notes
Called Ray [name] to discuss grant regarding their medication: Vyndamax.  Left voicemail asking patient to return call to the specialty pharmacy.  Will call patient again in 2 business days if no call back received.Shelva Majestic, CPhT  Pharmacy Patient Advocate, Specialty Pharmacy  [phone]    *received signed documents. Need taxes/ssi letter.

## 2019-08-16 NOTE — Telephone Encounter
Called patient and [facility] requesting call back to discuss lab results.

## 2019-08-18 ENCOUNTER — Encounter: Admit: 2019-08-18 | Discharge: 2019-08-18 | Payer: MEDICARE

## 2019-08-18 MED ORDER — MAGNESIUM OXIDE 400 MG (241.3 MG MAGNESIUM) PO TAB
400 mg | ORAL_TABLET | Freq: Every day | ORAL | 1 refills | Status: AC
Start: 2019-08-18 — End: ?

## 2019-08-18 MED ORDER — POTASSIUM CHLORIDE 20 MEQ PO TBTQ
40 meq | ORAL_TABLET | Freq: Every day | ORAL | 3 refills | 30.00000 days | Status: AC
Start: 2019-08-18 — End: ?

## 2019-08-18 MED ORDER — POTASSIUM CHLORIDE 20 MEQ PO TBTQ
40 meq | ORAL_TABLET | Freq: Every day | ORAL | 3 refills | 30.00000 days | Status: DC
Start: 2019-08-18 — End: 2019-08-18

## 2019-08-18 NOTE — Progress Notes
The medication assistance application for Adah Salvage Kady's specialty medication Jeannie Fend has been submitted to [employer].  A decision from the drug company/manufacturer may take up to 30 business days.  The specialty team will continue to follow.    Shelva Majestic  Pharmacy Patient Advocate - Specialty Pharmacy  [phone]    *Have not received patients income documents to see if they qualify for [facility] grant. Submitted application to [employer].

## 2019-08-18 NOTE — Telephone Encounter
You  [name], PHARMD; [name], PHARMD 1 minute ago (10:16 AM)   TA  Hi [name] and [place],     [name] had an appt with [name] @ [facility] on 5/18. ?I was wondering if you were able to see him that day regarding financial info for his tafamidis? ?Please see message below form [name],     Please advise     Thank you   [name]     Message text    Council Mechanic, RN  Cvm Nurse Hf Team [place]; Lawerance Bach, Carmelina Dane, APRN-NP 2 days ago   ED  Reminder to have pt see pharmacy team before or after OV to discuss getting his financial info turned in for tafamidis assistance.   Thanks!  E    Message text    Council Mechanic, RN  [name], PHARMD 2 weeks ago   ED  Thanks!    Message text    [name], PHARMD  Council Mechanic, RN; Leonides Sake, APRN-NP; Cvm Nurse Hf Team [place]; [place], [name], [place] 2 weeks ago   AN  Thanks [name] - I will put him on our calendar so I don't forget!    Message text    Council Mechanic, RN 2 weeks ago   ED     Addended by: Sharlee Blew on: 08/04/2019 11:01 AM    ? ?Modules accepted: Orders           Documentation    Council Mechanic, RN  Leonides Sake, APRN-NP; Cvm Nurse Hf Team [place]; [place], [employer], PHARMD; [name], PHARMD 2 weeks ago   ED  Pt is coming 5/18 for HF infusion follow-up. Please have him get pending urine immunofixation lab. He also still needs a baseline 6 min walk and KCCQ12.     He has not returned calls to medication assistance so please have him meet with [name] or Alissa after visit. He needs to contact specialty pharmacy to get re-enrolled for help getting application started for tafamidis assistance.    Routing comment

## 2019-08-19 ENCOUNTER — Encounter: Admit: 2019-08-19 | Discharge: 2019-08-19 | Payer: MEDICARE

## 2019-08-25 ENCOUNTER — Encounter: Admit: 2019-08-25 | Discharge: 2019-08-25 | Payer: MEDICARE

## 2019-08-25 DIAGNOSIS — I5030 Unspecified diastolic (congestive) heart failure: Secondary | ICD-10-CM

## 2019-08-25 LAB — BASIC METABOLIC PANEL
Lab: 102
Lab: 108 — ABNORMAL HIGH (ref 70–105)
Lab: 140
Lab: 2 — ABNORMAL HIGH (ref 0.72–1.25)
Lab: 26
Lab: 3.9
Lab: 33
Lab: 48 — ABNORMAL HIGH (ref 8.4–25.7)
Lab: 9.3

## 2019-08-25 NOTE — Telephone Encounter
routing symptom update to coordinate with lab results to KB, NP for any additional instructions.

## 2019-08-25 NOTE — Telephone Encounter
Message received from pt's wife, [name] returning call to Mick Sell, RN. Returned call and pt reports his weight this am was stable at 170.0 for the past 3 weeks. Pt states he develops BLE edema immediately once he is up and moving around. Pt states his SOA has been worse in the last three weeks. He could not tell much difference in SOA since reducing Bumex to 1 mg bid. Pt did say Bumex 2 mg in the am did help with his swelling in the mornings but still increased by the end of the day. Pt states he has been having cramps in his legs and feet since he started Bumex. Pt reports he is having trouble with feeling fatigue. Pt is drinking more that 64 oz at times because he feels dehydrated. No lightheadedness or dizziness. He is elevating his legs twice daily. No B/Ps to report.    Reviewed cardiac meds. Pt is did take Bumex 2 mg bid with 40 mEq bid X 3 days. Now he is taking Bumex 1 mg bid with 40 mEq bid. Confirmed he did start Magnesium 400 mg daily.    Pt has surgery to remove basal cell carcinoma on his back and was off Xarelto for about 3 days.

## 2019-08-26 ENCOUNTER — Encounter: Admit: 2019-08-26 | Discharge: 2019-08-26 | Payer: MEDICARE

## 2019-08-26 NOTE — Telephone Encounter
Call to patient and LVM to offer appt on June 2nd at  10 am [facility] location. Schedule pt and requested call back to confirm appt, also sent My Chart message to patient.

## 2019-08-31 ENCOUNTER — Encounter: Admit: 2019-08-31 | Discharge: 2019-08-31 | Payer: MEDICARE

## 2019-08-31 ENCOUNTER — Ambulatory Visit: Admit: 2019-08-31 | Discharge: 2019-08-31 | Payer: MEDICARE

## 2019-08-31 ENCOUNTER — Ambulatory Visit: Admit: 2019-08-31 | Discharge: 2019-09-01 | Payer: MEDICARE

## 2019-08-31 DIAGNOSIS — N1832 Stage 3b chronic kidney disease (HCC): Secondary | ICD-10-CM

## 2019-08-31 DIAGNOSIS — I358 Other nonrheumatic aortic valve disorders: Secondary | ICD-10-CM

## 2019-08-31 DIAGNOSIS — I10 Essential (primary) hypertension: Principal | ICD-10-CM

## 2019-08-31 DIAGNOSIS — E8582 Wild-type transthyretin-related (ATTR) amyloidosis: Secondary | ICD-10-CM

## 2019-08-31 DIAGNOSIS — I4819 Other persistent atrial fibrillation: Secondary | ICD-10-CM

## 2019-08-31 DIAGNOSIS — I5033 Acute on chronic diastolic (congestive) heart failure: Secondary | ICD-10-CM

## 2019-08-31 DIAGNOSIS — E785 Hyperlipidemia, unspecified: Secondary | ICD-10-CM

## 2019-08-31 DIAGNOSIS — I428 Other cardiomyopathies: Secondary | ICD-10-CM

## 2019-08-31 DIAGNOSIS — C4490 Unspecified malignant neoplasm of skin, unspecified: Secondary | ICD-10-CM

## 2019-08-31 DIAGNOSIS — I5030 Unspecified diastolic (congestive) heart failure: Secondary | ICD-10-CM

## 2019-08-31 DIAGNOSIS — J302 Other seasonal allergic rhinitis: Secondary | ICD-10-CM

## 2019-08-31 LAB — BASIC METABOLIC PANEL
Lab: 1.8 mg/dL — ABNORMAL HIGH (ref 0.4–1.24)
Lab: 100 MMOL/L (ref 98–110)
Lab: 130 mg/dL — ABNORMAL HIGH (ref 70–100)
Lab: 137 MMOL/L (ref 137–147)
Lab: 27 MMOL/L (ref 21–30)
Lab: 36 mL/min — ABNORMAL LOW (ref >60)
Lab: 4.1 MMOL/L (ref 3.5–5.1)
Lab: 44 mL/min — ABNORMAL LOW (ref >60)
Lab: 47 mg/dL — ABNORMAL HIGH (ref 7–25)
Lab: 9.4 mg/dL (ref 8.5–10.6)
Lab: 10 (ref 3–12)

## 2019-08-31 MED ORDER — FUROSEMIDE IV DRIP SYR (MAX CONC)
40-80 mg/h | INTRAVENOUS | 0 refills | Status: DC
Start: 2019-08-31 — End: 2019-09-01
  Administered 2019-08-31: 19:00:00 40 mg/h via INTRAVENOUS

## 2019-08-31 MED ORDER — MAGNESIUM OXIDE 400 MG (241.3 MG MAGNESIUM) PO TAB
400 mg | Freq: Once | ORAL | 0 refills | Status: CN
Start: 2019-08-31 — End: ?

## 2019-08-31 MED ORDER — FUROSEMIDE IV DRIP SYR (MAX CONC)
40-80 mg/h | INTRAVENOUS | 0 refills | Status: CN
Start: 2019-08-31 — End: ?

## 2019-08-31 MED ORDER — POTASSIUM CHLORIDE 10 MEQ PO TBTQ
10-60 meq | ORAL | 0 refills | Status: DC | PRN
Start: 2019-08-31 — End: 2019-09-01
  Administered 2019-08-31: 19:00:00 30 meq via ORAL

## 2019-08-31 MED ORDER — POTASSIUM CHLORIDE 10 MEQ PO TBTQ
10-60 meq | ORAL | 0 refills | Status: CN | PRN
Start: 2019-08-31 — End: ?

## 2019-08-31 MED ORDER — FUROSEMIDE 10 MG/ML IJ SOLN
40-80 mg | INTRAVENOUS | 0 refills | Status: CN
Start: 2019-08-31 — End: ?

## 2019-08-31 MED ORDER — FUROSEMIDE 10 MG/ML IJ SOLN
40-80 mg | INTRAVENOUS | 0 refills | Status: CP
Start: 2019-08-31 — End: ?
  Administered 2019-08-31 (×3): 40 mg via INTRAVENOUS

## 2019-08-31 MED ORDER — MAGNESIUM OXIDE 400 MG (241.3 MG MAGNESIUM) PO TAB
400 mg | Freq: Once | ORAL | 0 refills | Status: CP
Start: 2019-08-31 — End: ?
  Administered 2019-08-31: 19:00:00 400 mg via ORAL

## 2019-08-31 NOTE — Progress Notes
Summary for Heart Failure Infusion Clinic   Today's Date: 08/31/2019               Today?s Goal: 2 L      Today you diuresed a total of 775 ml     Total dose of IV Push: 120mg  of Lasix   400 mg of Magnesium   30 mEq of Potassium      IV Infusion dose was: 40mg /hr x 3 hrs     Pre-infusion weight was: 181.2 lbs     Post-Infusion weight was: 181.2 lbs     Total Net Output: -       Post Infusion Vitals for today:                Blood Pressure:106/68              Heart Rate:97              Oxygen Saturation:98%       Patient Started on category: A    Patient ended on category: A

## 2019-08-31 NOTE — Patient Instructions
Instructions for Heart Failure Infusion Clinic Located on [facility] 9    You are scheduled for outpatient IV infusion therapy on: 09/01/19    Please check-in at the [facility] Admitting Desk at: 1:45 pm      Please arrive 15 minutes early so that you can check-in at admitting. If your appointment is prior to 2:30 pm you will need to check in at the [facility] Admitting (next to the [facility]). If your appointment is after 2:30pm you will need to check in at [facility] Admitting (next to the gift shop, main lobby).     Heart Failure Infusion Clinic late policy: You will not be seen if you are more than 15 minutes late to check in for your appointment. If you are unable to make your appointment time please call us in advance for rescheduling.    You should expect to be here for infusion therapy for approximately 4 hours.     If you are diabetic please make sure to bring any important medications with you including insulin.     You may want to bring a snack or two with you as well.     Please bring your blood pressure and weight logs.        If you need to cancel your infusion appointment please contact the Heart Failure Infusion Clinic at [phone].    Additional Heart Failure discharge instructions    1.  Weigh self each morning and record.  Please bring your blood pressure and weight logs.    2.  Follow a 2 gram sodium diet restriction    3. Limit fluid intake 64 ounces or less.  Do not push fluids.     4. Please call the Heart Failure Infusion Nurse Line at [phone] for any questions you may have about infusion therapy.    5.  Call [facility] at [phone] for any worsening symptoms of shortness of breath, lightheadedness, increasing swelling or weight gain >3 pounds overnight or 5 pounds in one week.  Please inform triage nurse that you are being treated in the Heart Failure Infusion Clinic.    Summary for Heart Failure Infusion Clinic   Today's Date: 08/31/2019               Today?s Goal: 2 L Today you diuresed a total of 775 ml     Total dose of IV Push: 120mg  of Lasix   400 mg of Magnesium   30 mEq of Potassium      IV Infusion dose was: 40mg /hr x 3 hrs     Pre-infusion weight was: 181.2 lbs     Post-Infusion weight was: 181.2 lbs     Total Net Output: -       Post Infusion Vitals for today:                Blood Pressure:106/68              Heart Rate:97              Oxygen Saturation:98%

## 2019-08-31 NOTE — Progress Notes
Medicare is listed as patient's primary insurance coverage.  Pre-certification is not required for hospitalizations.

## 2019-08-31 NOTE — Progress Notes
Date of Service: 08/31/2019    Ray [name] is a 84 y.o. male.   He is followed by Dr. Sherryll [name] and Dr. Wallene [name].    HPI     His past medical history is significant for heart failure with preserved ejection fraction, nonischemic cardiomyopathy, ATTR amyloidosis, persistent atrial fibrillation status post ablation,  hypertension, hyperlipidemia, NSVT, and chronic kidney disease.    He was seen by Dr. Sherryll [name] on 07/25/2019, at that visit he was instructed to stophis candesartan and start Tafamidis.  He was also found to be volume up at this visit and was sent to the infusion clinic.  His weight at this time was 179 pounds.    He was last seen on 08/16/2019 by Ray [name].  This was a follow-up visit from when he was infusion clinic.  He was stable at this time and no medication changes were made.    He returns today for follow-up.  He states of the last couple weeks his weight has continued to increase. His weight is now up 183 lbs (up 18 lbs).  He has swelling in his abdomen down (including his scrotum).  He is having shortness of breath with exertion. He denies orthopnea, paroxysmal nocturnal dyspnea, early satiety, chest pain, irregular heartbeat/palpitations, dizziness, and/or syncope/near syncope.         Vitals:    08/31/19 1003   BP: 126/84   BP Source: Arm, Left Upper   Patient Position: Sitting   Pulse: 86   SpO2: 97%   Weight: 83 kg (183 lb)   Height: 1.626 m (5' 4)   PainSc: Zero     Body mass index is 31.41 kg/m?[name]     Past Medical History  Patient Active Problem List    Diagnosis Date Noted   ? Wild-type transthyretin-related (ATTR) amyloidosis (HCC) 08/16/2019   ? Acute on chronic heart failure with preserved ejection fraction (HCC) 07/25/2019   ? S/P ablation of atrial fibrillation 05/18/2018   ? Lightheadedness 03/16/2018   ? Hospitalization within last 30 days 12/24/2017   ? Atrial fibrillation with RVR (HCC) 12/14/2017   ? Stage 3b chronic kidney disease (HCC) 12/13/2017   ? Cardiogenic shock (HCC) 12/12/2017   ? Bradycardia 12/12/2017   ? Bilateral leg edema 12/11/2017   ? On amiodarone therapy 12/11/2017   ? Heart failure with preserved ejection fraction (HCC) 01/01/2016   ? NSVT (nonsustained ventricular tachycardia) (HCC) 06/12/2015   ? Medication side effects 11/09/2014   ? Heart palpitations 09/19/2014   ? Persistent atrial fibrillation (HCC) 03/17/2013     03/26/2018 - ECHO:  Moderate concentric LVH seen. The EF is about 55%.  The atria are slightly dilated.  The RV function is mildly decreased.  Mild to moderate MR and TR seen.  Mild AI is present.  Based on the gradients and appearance, the AS appears to be mild. The mean gradient is 16 mm Hg with a peak velocity of 2.6 m/s.  03/26/2018 - Zio Patch:  Predominant rhythm was normal sinus.  Two runs of ventricular tachycardia occurred, they were monomorphic, the longest run lasted 3 seconds, this episode occurred on April 08, 2018 at 9:41 PM.  One episode of AIVR also appeared to be present, it lasted approximately 4.6 seconds  Brief and rare episodes of SVT that probably represent atrial fibrillation, the longest lasting 6.3 seconds.  No evidence of atrial flutter and no evidence of high degree AV block.  05/12/2018 - LAAA:  Persistent Atrial Fibrillation status  post successful pulmonary vein antral isolation.  Cavotricuspid Isthmus Ablation.  Ablation of Fractionated Intracardiac Electrograms.  Ablation for an Additional Discrete Arrhythmia-LA AFL after AFIB/PVI Ablation  06/23/2018 - Cardioversion:  Successful direct current cardioversion from symptomatic atrial fibrillation to sinus rhythm.  03/16/2019 - DCCV:  Successful direct current cardioversion from atrial fibrillation to sinus rhythm.     ? Systolic murmur of aorta 02/04/2013   ? SOB (shortness of breath) 02/04/2013   ? Carotid bruit present 01/23/2012   ? Aortic stenosis, mild 01/30/2011   ? Overweight (BMI 25.0-29.9) 01/30/2011   ? Essential hypertension 01/30/2009   ? Hyperlipidemia 01/30/2009     Hyperlipidemia, on statin therapy     ? Skin cancer 01/30/2009   ? Seasonal allergic reaction 01/30/2009     Review of Systems   Constitution: Positive for malaise/fatigue and weight gain.   HENT: Negative.    Eyes: Negative.    Cardiovascular: Positive for dyspnea on exertion and leg swelling.   Respiratory: Negative.    Endocrine: Negative.    Hematologic/Lymphatic: Negative.    Skin: Negative.    Gastrointestinal: Positive for bloating.   Genitourinary: Negative.    Neurological: Negative.    Psychiatric/Behavioral: Negative.    Allergic/Immunologic: Negative.        Physical Exam  Constitutional: No acute distress  HENT:  Head - normocephalic   Eyes: Conjunctivae normal  Neck: 10 JVP, + HJR  Cardiac Rhythm: Irregular rate, irregular rhythm  Cardiac Ausculation:  S1/S2 normal, no definitive S3 or S4, no gallop or friction rub  Murmurs: Murmur heard  Lower Extremity Edema: 2+ lower extremity edema  Pulmonary/Chest: Breathing comfortably, no respiratory distress, lungs clear to ausculation, no rales / rhonchi / wheezing  Abdominal: Firm, distended, non-tender, no obvious masses, bowel sounds present  Musculoskeletal: Moves all extremities, walks without assistance  Skin: Warm / dry, no erythema   Neurological: Alert and oriented to person, place, and time; no focal deficits  Psychiatric: Normal mood and affect; judgment, behavior, and thought content normal  Language and Memory: patient responsive and seems to comprehend information   Vital signs reviewed    Cardiovascular Studies  01/10/2019 echocardiogram  1. Normal LV size and systolic function.  LVEF is 60%  2. Moderate concentric left ventricular hypertrophy  3. Unable to assess diastolic function due to atrial fibrillation  4. Moderate biatrial dilatation  5. Normal right ventricular size and systolic function  6. Normal central venous pressure  7. Mild calcific aortic valve stenosis.  Mean gradient is 16 mmHg with a peak velocity of 2.8 m/s. Trace aortic valve regurgitation is seen  8. Mitral valve is structurally okay.  There is moderate central regurgitation.  No stenosis  9. Moderate tricuspid valve regurgitation  10. Pulmonary artery static pressure measures at 33 mmHg  11. No pericardial effusion  ?  Compared to prior study from 2019 overall findings are similar    Problems Addressed Today  Encounter Diagnoses   Name Primary?   ? Acute on chronic heart failure with preserved ejection fraction (HCC) Yes   ? Heart failure with preserved ejection fraction, unspecified HF chronicity (HCC)    ? Persistent atrial fibrillation (HCC)    ? Stage 3b chronic kidney disease (HCC)    ? Wild-type transthyretin-related (ATTR) amyloidosis (HCC)    ? Nonischemic cardiomyopathy (HCC)        Assessment and Plan     Acute on Chronic Heart Failure with Preserved Ejection Fraction:    -Echo on  01/26/19 showed an EF of 60%.  -GDMT: Bumex 1 mg twice daily  -His dry weight is around 165 pounds.  -Today, he  describes NYHA Functional Class III-IV symptoms, appears hypervolemic by exam.  He had gone to the infusion clinic at the end of April and lost 14 pounds.  He had been doing well for the first couple weeks but then has gradually started to increase his weight.  He is now up 18 pounds.  He tried to increase his Bumex without much effect.  Given he is 18 pounds up will have him go to the infusion clinic.  Will need to work on optimizing his diuretic therapy post infusion.  May consider adding spironolactone in the future.    Nonischemic Cardiomyopathy:    -GDMT as described above.    ATTR Amyloidosis:  His PYP scan on 06/28/2019 was abnormal and strongly suggest of ATTR cardiac amyloidosis.  He had light chains completed on 07/25/2019 which revealed kappa 7.45, lambda 4.07, ratio of 1.83.  -He was started on Tafamidis but due to cost he has not been able to start as of yet.    Atrial Fibrillation (Persistent):  -He is on diltiazem and denies palpitations.  -He  is on Xarelto oral anticoagulation with no significant signs / symptoms of bleeding.    Hypertension:  -Controlled on current regimen, continue to monitor.    Chronic Kidney Disease - Stage III:  -Most recent creatinine on 08/25/2019 was 2.05 (baseline 1.8?1.9).  -Recheck BMP today.    Follow Up Visit: Infusion clinic today, follow-up with heart failure nurse practitioner in 1 week.      I have personally documented the HPI, exam and medical decision making.  Patient education: I reviewed recent lab results and current medications, medication instructions, discussed heart failure signs & symptoms,  low  sodium diet, fluid restriction and daily weights. I have instructed the patient on the plan of care and they verbalize understanding of the plan. Please see AVS for full patient teaching. Patient advised to call our office if s/he has any problems, questions, worsening symptoms, or concerns prior to the next appointment.         Total time 45 minutes.  Estimated counseling time 25 minutes.     Thank you for allowing me to participate in the care of this patient. If you have any questions please do not hesitate to contact our office.      Justus Memory, DNP, APRN, NP-C  Collaborating Physician: Dr. Earnest Bailey  [facility]  Phone: [phone] Fax: [phone]         Current Medications (including today's revisions)  ? atorvastatin (LIPITOR) 20 mg tablet TAKE 1 TABLET EVERY DAY (Patient taking differently: every 48 hours.)   ? bumetanide (BUMEX) 1 mg tablet Take one tablet by mouth twice daily.   ? calcium carbonate/vitamin D-3 (OSCAL-500+D) 1250 mg/200 unit tablet Take 1 tablet by mouth daily. Calcium Carb 1250mg  delivers 500mg  elemental Ca   ? dilTIAZem HCL (CARDIZEM) 30 mg tablet Take one tablet by mouth three times daily.   ? esomeprazole DR(+) (NEXIUM) 20 mg capsule Take 20 mg by mouth daily. Take on an empty stomach at least 1 hour before or 2 hours after food.   ? ferrous sulfate (FEOSOL) 325 mg (65 mg iron) tablet Take 325 mg by mouth twice daily. Take on an empty stomach at least 1 hour before or 2 hours after food.    ?  fish oil /omega-3 fatty acids (SEA-OMEGA) 340/1000 mg capsule Take 1 capsule by mouth daily.   ? fluticasone/salmeterol (ADVAIR) 100/50 mcg inhalation disk Inhale 1 Puff by mouth daily.   ? lactobacillus rhamnosus (GG) (CULTURELLE) 10 billion cell cap Take 1 capsule by mouth daily with breakfast.   ? magnesium oxide (MAG-OX) 400 mg (241.3 mg magnesium) tablet Take one tablet by mouth daily.   ? potassium chloride SR (K-DUR) 20 mEq tablet Take two tablets by mouth daily. Take with a meal and a full glass of water   ? sildenafiL (VIAGRA) 100 mg tablet TAKE 1 TABLET BY MOUTH ONCE DAILY AS NEEDED FOR SEXUAL ACTIVITY   ? tafamidis (VYNDAMAX) 61 mg capsule Take one capsule by mouth daily.   ? VENTOLIN HFA 90 mcg/actuation inhaler Inhale 1 puff by mouth into the lungs as Needed.   ? XARELTO 15 mg tablet Take 1 tablet by mouth once daily with food

## 2019-09-01 ENCOUNTER — Ambulatory Visit: Admit: 2019-09-01 | Discharge: 2019-09-02 | Payer: MEDICARE

## 2019-09-01 ENCOUNTER — Encounter: Admit: 2019-09-01 | Discharge: 2019-09-01 | Payer: MEDICARE

## 2019-09-01 DIAGNOSIS — I5033 Acute on chronic diastolic (congestive) heart failure: Principal | ICD-10-CM

## 2019-09-01 MED ORDER — FUROSEMIDE IV DRIP SYR (MAX CONC)
40-80 mg/h | INTRAVENOUS | 0 refills | Status: CN
Start: 2019-09-01 — End: ?

## 2019-09-01 MED ORDER — POTASSIUM CHLORIDE 10 MEQ PO TBTQ
10-60 meq | ORAL | 0 refills | Status: DC | PRN
Start: 2019-09-01 — End: 2019-09-02
  Administered 2019-09-01: 19:00:00 40 meq via ORAL

## 2019-09-01 MED ORDER — MAGNESIUM OXIDE 400 MG (241.3 MG MAGNESIUM) PO TAB
400 mg | Freq: Once | ORAL | 0 refills | Status: CN
Start: 2019-09-01 — End: ?

## 2019-09-01 MED ORDER — MAGNESIUM OXIDE 400 MG (241.3 MG MAGNESIUM) PO TAB
400 mg | Freq: Once | ORAL | 0 refills | Status: CP
Start: 2019-09-01 — End: ?
  Administered 2019-09-01: 19:00:00 400 mg via ORAL

## 2019-09-01 MED ORDER — FUROSEMIDE 10 MG/ML IJ SOLN
40-80 mg | INTRAVENOUS | 0 refills | Status: CN
Start: 2019-09-01 — End: ?

## 2019-09-01 MED ORDER — FUROSEMIDE IV DRIP SYR (MAX CONC)
40-80 mg/h | INTRAVENOUS | 0 refills | Status: DC
Start: 2019-09-01 — End: 2019-09-02
  Administered 2019-09-01: 19:00:00 40 mg/h via INTRAVENOUS

## 2019-09-01 MED ORDER — FUROSEMIDE 10 MG/ML IJ SOLN
40-80 mg | INTRAVENOUS | 0 refills | Status: CP
Start: 2019-09-01 — End: ?
  Administered 2019-09-01 (×2): 40 mg via INTRAVENOUS
  Administered 2019-09-01: 21:00:00 80 mg via INTRAVENOUS

## 2019-09-01 MED ORDER — POTASSIUM CHLORIDE 10 MEQ PO TBTQ
10-60 meq | ORAL | 0 refills | Status: CN | PRN
Start: 2019-09-01 — End: ?

## 2019-09-01 NOTE — Progress Notes
Summary for Heart Failure Infusion Clinic   Today's Date: 09/01/2019               Today?s Goal: 2 L      Today you diuresed a total of 1,100 ml     Total dose of IV Push: 160 mg of lasix   400 mg of Magnesium   50 mEq of Potassium      IV Infusion dose was: 40 mg/hr x 2hrs 80 mg/hr x 1 hr     Pre-infusion weight was: 180.6 lbs     Post-Infusion weight was: 178.6 lbs     Total Net Output:-710 ml       Post Infusion Vitals for today:                Blood Pressure:131/80              Heart Rate:81              Oxygen Saturation:98%       Patient Started on category: A    Patient ended on category: B

## 2019-09-01 NOTE — Progress Notes
The medication assistance application for Vyndamax has been approved for Ray [name] from 09/01/2019 to 03/30/2020.  The patient will receive the medication directly from the drug company during the approval period.    Drug Company Medication Assistance Contact Information: [employer] [phone]    Shelva Majestic  Pharmacy Patient Advocate  [phone]

## 2019-09-01 NOTE — Progress Notes
14:00 - Patient presents to HF Infusion Clinic for 2nd OP lasix infusion. Weight down 0.6 lbs from start of yesterday's infusion. Weight today 180.6 lbs, goal dry weight 165 lbs. Labs stable, creatinine 1.8 again today. Lasix infusion started at 40 mg protocol.     15:55 - Patient has been infusing for 2 hours and has had 725 ml UOP. Bunnie Philips, APRN notified. Per Boneta Lucks, OK to bump lasix infusion up to 80 mg protocol on gtt and last bolus. Patient updated with plan of care, verbalized understanding.

## 2019-09-02 ENCOUNTER — Ambulatory Visit: Admit: 2019-09-02 | Discharge: 2019-09-03 | Payer: MEDICARE

## 2019-09-02 ENCOUNTER — Encounter: Admit: 2019-09-02 | Discharge: 2019-09-02 | Payer: MEDICARE

## 2019-09-02 DIAGNOSIS — E8582 Wild-type transthyretin-related (ATTR) amyloidosis: Secondary | ICD-10-CM

## 2019-09-02 MED ORDER — FUROSEMIDE 10 MG/ML IJ SOLN
40-80 mg | INTRAVENOUS | 0 refills | Status: CN
Start: 2019-09-02 — End: ?

## 2019-09-02 MED ORDER — FUROSEMIDE 10 MG/ML IJ SOLN
40-80 mg | INTRAVENOUS | 0 refills | Status: CP
Start: 2019-09-02 — End: ?
  Administered 2019-09-02 (×3): 80 mg via INTRAVENOUS

## 2019-09-02 MED ORDER — POTASSIUM CHLORIDE 10 MEQ PO TBTQ
10-60 meq | ORAL | 0 refills | Status: DC | PRN
Start: 2019-09-02 — End: 2019-09-02
  Administered 2019-09-02: 18:00:00 30 meq via ORAL

## 2019-09-02 MED ORDER — FUROSEMIDE IV DRIP SYR (MAX CONC)
40-80 mg/h | INTRAVENOUS | 0 refills | Status: DC
Start: 2019-09-02 — End: 2019-09-02
  Administered 2019-09-02: 18:00:00 80 mg/h via INTRAVENOUS

## 2019-09-02 MED ORDER — FUROSEMIDE IV DRIP SYR (MAX CONC)
40-80 mg/h | INTRAVENOUS | 0 refills | Status: CN
Start: 2019-09-02 — End: ?

## 2019-09-02 MED ORDER — MAGNESIUM OXIDE 400 MG (241.3 MG MAGNESIUM) PO TAB
400 mg | Freq: Once | ORAL | 0 refills | Status: CN
Start: 2019-09-02 — End: ?

## 2019-09-02 MED ORDER — POTASSIUM CHLORIDE 10 MEQ PO TBTQ
10-60 meq | ORAL | 0 refills | Status: CN | PRN
Start: 2019-09-02 — End: ?

## 2019-09-02 MED ORDER — MAGNESIUM OXIDE 400 MG (241.3 MG MAGNESIUM) PO TAB
400 mg | Freq: Once | ORAL | 0 refills | Status: CP
Start: 2019-09-02 — End: ?
  Administered 2019-09-02: 18:00:00 400 mg via ORAL

## 2019-09-02 NOTE — Progress Notes
Summary for Heart Failure Infusion Clinic   Today's Date: 09/02/2019               Today?s Goal: 2 L      Today you diuresed a total of 1125 ml     Total dose of IV Push: 240mg  of Lasix   400 mg of Magnesium   30 mEq of Potassium      IV Infusion dose was: 80mg /hr x 3 hrs     Pre-infusion weight was: 180.2 lbs     Post-Infusion weight was: 177.8 lbs     Total Net Output: -       Post Infusion Vitals for today:                Blood Pressure:147/87              Heart Rate:90              Oxygen Saturation:99%       Patient Started on category: B    Patient ended on category: B

## 2019-09-02 NOTE — Progress Notes
Diuretic Dosing Changes for HF Infusion Clinic      Instructions from [facility], APRN, to change patient's medication to the following:     Bumex 2mg  twice a day on Saturday and Sunday  Potassium twice a day on Saturday and Sunday    Continue on above dose until Monday, 09/05/19.

## 2019-09-03 DIAGNOSIS — I5033 Acute on chronic diastolic (congestive) heart failure: Principal | ICD-10-CM

## 2019-09-05 ENCOUNTER — Encounter: Admit: 2019-09-05 | Discharge: 2019-09-05 | Payer: MEDICARE

## 2019-09-05 ENCOUNTER — Ambulatory Visit: Admit: 2019-09-05 | Discharge: 2019-09-06 | Payer: MEDICARE

## 2019-09-05 ENCOUNTER — Inpatient Hospital Stay: Admit: 2019-09-05 | Payer: MEDICARE

## 2019-09-05 DIAGNOSIS — I5033 Acute on chronic diastolic (congestive) heart failure: Secondary | ICD-10-CM

## 2019-09-05 LAB — CBC AND DIFF
Lab: 0 10*3/uL (ref 0–0.20)
Lab: 0.1 10*3/uL (ref 0–0.45)
Lab: 0.8 10*3/uL — ABNORMAL HIGH (ref 0–0.80)
Lab: 14 g/dL (ref 13.5–16.5)
Lab: 15 % (ref 11–15)
Lab: 187 10*3/uL (ref 150–400)
Lab: 42 % (ref 40–50)
Lab: 69 % (ref 41–77)
Lab: 7.4 10*3/uL (ref 4.5–11.0)
Lab: 9.3 FL (ref 7–11)
Lab: 92 FL (ref 80–100)

## 2019-09-05 LAB — COVID-19 (SARS-COV-2) PCR

## 2019-09-05 LAB — BNP (B-TYPE NATRIURETIC PEPTI): Lab: 151 pg/mL — ABNORMAL HIGH (ref 0–100)

## 2019-09-05 LAB — MAGNESIUM: Lab: 2.2 mg/dL (ref 1.6–2.6)

## 2019-09-05 MED ORDER — FUROSEMIDE 10 MG/ML IJ SOLN
40-80 mg | INTRAVENOUS | 0 refills | Status: CN
Start: 2019-09-05 — End: ?

## 2019-09-05 MED ORDER — MAGNESIUM OXIDE 400 MG (241.3 MG MAGNESIUM) PO TAB
400 mg | Freq: Once | ORAL | 0 refills | Status: DC
Start: 2019-09-05 — End: 2019-09-05

## 2019-09-05 MED ORDER — FUROSEMIDE IV DRIP SYR (MAX CONC)
40-80 mg/h | INTRAVENOUS | 0 refills | Status: CN
Start: 2019-09-05 — End: ?

## 2019-09-05 MED ORDER — BUMETANIDE 0.25MG/ML IV DRIP (STD CONC)
1 mg/h | INTRAVENOUS | 0 refills | Status: DC
Start: 2019-09-05 — End: 2019-09-09
  Administered 2019-09-05 – 2019-09-09 (×8): 1 mg/h via INTRAVENOUS

## 2019-09-05 MED ORDER — TRAZODONE 50 MG PO TAB
50 mg | Freq: Every evening | ORAL | 0 refills | Status: DC | PRN
Start: 2019-09-05 — End: 2019-09-06
  Administered 2019-09-06: 04:00:00 50 mg via ORAL

## 2019-09-05 MED ORDER — MAGNESIUM OXIDE 400 MG (241.3 MG MAGNESIUM) PO TAB
400 mg | Freq: Once | ORAL | 0 refills | Status: CN
Start: 2019-09-05 — End: ?

## 2019-09-05 MED ORDER — POTASSIUM CHLORIDE 20 MEQ PO TBTQ
40 meq | Freq: Every day | ORAL | 0 refills | Status: DC
Start: 2019-09-05 — End: 2019-09-05

## 2019-09-05 MED ORDER — FUROSEMIDE 10 MG/ML IJ SOLN
40-80 mg | INTRAVENOUS | 0 refills | Status: DC
Start: 2019-09-05 — End: 2019-09-05

## 2019-09-05 MED ORDER — ATORVASTATIN 20 MG PO TAB
20 mg | ORAL | 0 refills | Status: DC
Start: 2019-09-05 — End: 2019-09-11
  Administered 2019-09-06 – 2019-09-10 (×3): 20 mg via ORAL

## 2019-09-05 MED ORDER — POTASSIUM CHLORIDE 10 MEQ PO TBTQ
10-60 meq | ORAL | 0 refills | Status: DC | PRN
Start: 2019-09-05 — End: 2019-09-05

## 2019-09-05 MED ORDER — BUMETANIDE 0.25 MG/ML IJ SOLN
4 mg | Freq: Once | INTRAVENOUS | 0 refills | Status: CP
Start: 2019-09-05 — End: ?
  Administered 2019-09-05: 19:00:00 4 mg via INTRAVENOUS

## 2019-09-05 MED ORDER — FLUTICASONE PROPION-SALMETEROL 100-50 MCG/DOSE IN DSDV
1 | Freq: Two times a day (BID) | RESPIRATORY_TRACT | 0 refills | Status: DC
Start: 2019-09-05 — End: 2019-09-05

## 2019-09-05 MED ORDER — DILTIAZEM HCL 30 MG PO TAB
30 mg | Freq: Three times a day (TID) | ORAL | 0 refills | Status: DC
Start: 2019-09-05 — End: 2019-09-06
  Administered 2019-09-05 – 2019-09-06 (×4): 30 mg via ORAL

## 2019-09-05 MED ORDER — RIVAROXABAN 15 MG PO TAB
15 mg | Freq: Every day | ORAL | 0 refills | Status: DC
Start: 2019-09-05 — End: 2019-09-11
  Administered 2019-09-06 – 2019-09-11 (×6): 15 mg via ORAL

## 2019-09-05 MED ORDER — FUROSEMIDE IV DRIP SYR (MAX CONC)
40-80 mg/h | INTRAVENOUS | 0 refills | Status: DC
Start: 2019-09-05 — End: 2019-09-05

## 2019-09-05 MED ORDER — BUMETANIDE 0.25MG/ML IV DRIP (STD CONC)
1 mg/h | INTRAVENOUS | 0 refills | Status: DC
Start: 2019-09-05 — End: 2019-09-05
  Administered 2019-09-05: 19:00:00 1 mg/h via INTRAVENOUS

## 2019-09-05 MED ORDER — ALBUTEROL SULFATE 90 MCG/ACTUATION IN HFAA
1 | RESPIRATORY_TRACT | 0 refills | Status: DC | PRN
Start: 2019-09-05 — End: 2019-09-11

## 2019-09-05 MED ORDER — PANTOPRAZOLE 20 MG PO TBEC
20 mg | Freq: Every day | ORAL | 0 refills | Status: DC
Start: 2019-09-05 — End: 2019-09-11
  Administered 2019-09-07 – 2019-09-11 (×5): 20 mg via ORAL

## 2019-09-05 MED ORDER — FLUTICASONE PROPION-SALMETEROL 100-50 MCG/DOSE IN DSDV
1 | Freq: Every day | RESPIRATORY_TRACT | 0 refills | Status: DC
Start: 2019-09-05 — End: 2019-09-11
  Administered 2019-09-06: 10:00:00 1 via RESPIRATORY_TRACT

## 2019-09-05 MED ORDER — POTASSIUM CHLORIDE 10 MEQ PO TBTQ
10-60 meq | ORAL | 0 refills | Status: CN | PRN
Start: 2019-09-05 — End: ?

## 2019-09-05 MED ORDER — MAGNESIUM OXIDE 400 MG (241.3 MG MAGNESIUM) PO TAB
400 mg | Freq: Every day | ORAL | 0 refills | Status: DC
Start: 2019-09-05 — End: 2019-09-11
  Administered 2019-09-07 – 2019-09-11 (×5): 400 mg via ORAL

## 2019-09-05 NOTE — Progress Notes
RT Adult Assessment Note    NAME:Ray [name]             MRN: [id number]             DOB:December 12, 1935          AGE: 84 y.o.  ADMISSION DATE: 09/05/2019             DAYS ADMITTED: LOS: 0 days    RT Treatment Plan:  Protocol Plan: Medications  Albuterol: MDI PRN  Advair (Home regimen only): Qday         Additional Comments:  Impressions of the patient: Walking around room upon entry.  Intervention(s)/outcome(s): Eval. Pt takes Advair QDAY at home, adjusted order to match. Will begin with pt tomorrow.  Patient education that was completed:   Recommendations to the care team: Continue to monitor.    Vital Signs:  Pulse: 102  RR:    SpO2: 99 %  O2 Device:    Liter Flow:    O2%:    Breath Sounds: Clear (Implies normal)  Respiratory Effort: Non-Labored

## 2019-09-05 NOTE — Progress Notes
Patient presents to HF Infusion Clinic for 4th OP lasix infusion. Creatinine today 2.3, was 1.9 on Friday. VSS. Weight today 177.4 lbs, down approx 3 lbs from start of infusion on Friday. Goal Dry weight 165 lbs. Per protocol, [name], APRN notified of increase in creatinine. Diannia Ruder to come assess patient and determine plan of care.    Diannia Ruder and Dr. [name] assessing patient in infusion clinic. Per Dr. [name], patient needs to be admitted to to the hospital for further diuresis for CHF exacerbation. Patient verbalized understanding and is agreeable.     Verbal order from [place] to give 4mg  IVP bumex then start bumex gtt at 1 mg/hr while patient awaits an inpatient bed.

## 2019-09-05 NOTE — Progress Notes
Patient assigned to room HC723. Report given to Robynn Pane, RN on [place]. Patient transported down to room via wheelchair accompanied by infusion RN.

## 2019-09-05 NOTE — Progress Notes
Please see IP consult note.       Patient was seen and examined in the HF infusion clinic by myself as well as Dr. [name].  Despite a bump in his creatinine, he has a significant amount of fluid on him with 4+BLE edema up through thighs with areas of weeping.  He is also complaining of decreased appetite and poor sleep quality.  After discussion with Dr. [name], it was decided to admit him for more aggressive diuresis.  He is agreeable.  We will start him on bumex gtt at 1mg /hr with 4mg  bolus.  AOD called.  Dr. Jonnie Finner accepted.  Will request a tele bed.      Please consult HF once admitted.       [name], APRN  Available on [place]

## 2019-09-05 NOTE — Consults
Please see my consult note from earlier today, 09/05/2019.

## 2019-09-06 ENCOUNTER — Encounter: Admit: 2019-09-06 | Discharge: 2019-09-06 | Payer: MEDICARE

## 2019-09-06 ENCOUNTER — Inpatient Hospital Stay: Admit: 2019-09-06 | Discharge: 2019-09-06 | Payer: MEDICARE

## 2019-09-06 DIAGNOSIS — I5033 Acute on chronic diastolic (congestive) heart failure: Principal | ICD-10-CM

## 2019-09-06 LAB — BASIC METABOLIC PANEL
Lab: 1.8 mg/dL — ABNORMAL HIGH (ref 0.4–1.24)
Lab: 11 (ref 3–12)
Lab: 137 MMOL/L (ref 137–147)
Lab: 158 mg/dL — ABNORMAL HIGH (ref 70–100)
Lab: 29 MMOL/L (ref 21–30)
Lab: 36 mL/min — ABNORMAL LOW (ref >60)
Lab: 43 mL/min — ABNORMAL LOW (ref >60)
Lab: 51 mg/dL — ABNORMAL HIGH (ref 7–25)
Lab: 9.3 mg/dL (ref 8.5–10.6)
Lab: 97 MMOL/L — ABNORMAL LOW (ref 98–110)

## 2019-09-06 LAB — MAGNESIUM: Lab: 2.2 mg/dL (ref 1.6–2.6)

## 2019-09-06 MED ORDER — FLECAINIDE 50 MG PO TAB
50 mg | Freq: Two times a day (BID) | ORAL | 0 refills | Status: DC
Start: 2019-09-06 — End: 2019-09-06

## 2019-09-06 MED ORDER — SENNOSIDES 8.8 MG/5 ML PO SYRP
17.6 mg | Freq: Two times a day (BID) | ORAL | 0 refills | Status: DC | PRN
Start: 2019-09-06 — End: 2019-09-06

## 2019-09-06 MED ORDER — MELATONIN 3 MG PO TAB
3 mg | Freq: Every evening | ORAL | 0 refills | Status: DC
Start: 2019-09-06 — End: 2019-09-11
  Administered 2019-09-07 – 2019-09-11 (×4): 3 mg via ORAL

## 2019-09-06 MED ORDER — POTASSIUM CHLORIDE 20 MEQ PO TBTQ
40 meq | Freq: Once | ORAL | 0 refills | Status: CP
Start: 2019-09-06 — End: ?
  Administered 2019-09-06: 17:00:00 40 meq via ORAL

## 2019-09-06 MED ORDER — POTASSIUM CHLORIDE 20 MEQ PO TBTQ
40 meq | Freq: Once | ORAL | 0 refills | Status: CP
Start: 2019-09-06 — End: ?
  Administered 2019-09-06: 40 meq via ORAL

## 2019-09-06 MED ORDER — DOCUSATE SODIUM 50 MG/5 ML PO LIQD
100 mg | Freq: Two times a day (BID) | ORAL | 0 refills | Status: DC
Start: 2019-09-06 — End: 2019-09-06

## 2019-09-06 MED ORDER — AMIODARONE 200 MG PO TAB
200 mg | Freq: Two times a day (BID) | ORAL | 0 refills | Status: DC
Start: 2019-09-06 — End: 2019-09-07
  Administered 2019-09-07 (×2): 200 mg via ORAL

## 2019-09-06 MED ORDER — DILTIAZEM HCL 30 MG PO TAB
30 mg | Freq: Three times a day (TID) | ORAL | 0 refills | Status: DC
Start: 2019-09-06 — End: 2019-09-07
  Administered 2019-09-07 (×2): 30 mg via ORAL

## 2019-09-06 MED ORDER — ACETAZOLAMIDE 250 MG PO TAB
250 mg | Freq: Two times a day (BID) | ORAL | 0 refills | Status: DC
Start: 2019-09-06 — End: 2019-09-09
  Administered 2019-09-06 – 2019-09-09 (×7): 250 mg via ORAL

## 2019-09-06 MED ORDER — POTASSIUM CHLORIDE 20 MEQ PO TBTQ
40 meq | Freq: Once | ORAL | 0 refills | Status: CP
Start: 2019-09-06 — End: ?
  Administered 2019-09-06: 07:00:00 40 meq via ORAL

## 2019-09-06 MED ORDER — SENNOSIDES-DOCUSATE SODIUM 8.6-50 MG PO TAB
1 | Freq: Two times a day (BID) | ORAL | 0 refills | Status: DC | PRN
Start: 2019-09-06 — End: 2019-09-11
  Administered 2019-09-06 – 2019-09-11 (×4): 1 via ORAL

## 2019-09-06 MED ORDER — AMIODARONE 200 MG PO TAB
400 mg | Freq: Two times a day (BID) | ORAL | 0 refills | Status: DC
Start: 2019-09-06 — End: 2019-09-06

## 2019-09-06 MED ORDER — POTASSIUM CHLORIDE 20 MEQ PO TBTQ
40 meq | Freq: Once | ORAL | 0 refills | Status: CP
Start: 2019-09-06 — End: ?
  Administered 2019-09-06: 14:00:00 40 meq via ORAL

## 2019-09-06 NOTE — Progress Notes
CMU informed this RN via [employer] that pt ran 11 beats of Vtach. Pt ran six more beats of Vtach a few moments later. RN checked pt VS and assessed for symptoms. Pt denied CP, SOA, a sense of doom. VSS. Notified [place], MD via [employer]te. Will continue to monitor.

## 2019-09-07 ENCOUNTER — Encounter: Admit: 2019-09-07 | Discharge: 2019-09-07 | Payer: MEDICARE

## 2019-09-07 DIAGNOSIS — J302 Other seasonal allergic rhinitis: Secondary | ICD-10-CM

## 2019-09-07 DIAGNOSIS — I10 Essential (primary) hypertension: Secondary | ICD-10-CM

## 2019-09-07 DIAGNOSIS — I358 Other nonrheumatic aortic valve disorders: Secondary | ICD-10-CM

## 2019-09-07 DIAGNOSIS — C4490 Unspecified malignant neoplasm of skin, unspecified: Secondary | ICD-10-CM

## 2019-09-07 DIAGNOSIS — E785 Hyperlipidemia, unspecified: Secondary | ICD-10-CM

## 2019-09-07 MED ORDER — POTASSIUM CHLORIDE 20 MEQ PO TBTQ
60 meq | Freq: Three times a day (TID) | ORAL | 0 refills | Status: DC
Start: 2019-09-07 — End: 2019-09-08
  Administered 2019-09-07 – 2019-09-08 (×4): 60 meq via ORAL

## 2019-09-07 MED ORDER — BACITRACIN ZINC 500 UNIT/GRAM TP OINT
Freq: Once | TOPICAL | 0 refills | Status: CP
Start: 2019-09-07 — End: ?
  Administered 2019-09-07: 22:00:00 via TOPICAL

## 2019-09-07 MED ORDER — AMIODARONE 200 MG PO TAB
400 mg | Freq: Two times a day (BID) | ORAL | 0 refills | Status: DC
Start: 2019-09-07 — End: 2019-09-11
  Administered 2019-09-08 – 2019-09-11 (×7): 400 mg via ORAL

## 2019-09-07 MED ORDER — DILTIAZEM HCL 30 MG PO TAB
30 mg | Freq: Three times a day (TID) | ORAL | 0 refills | Status: DC
Start: 2019-09-07 — End: 2019-09-08
  Administered 2019-09-07 – 2019-09-08 (×3): 30 mg via ORAL

## 2019-09-08 MED ORDER — CAMPHOR-MENTHOL 0.5-0.5 % TP LOTN
Freq: Three times a day (TID) | TOPICAL | 0 refills | Status: DC | PRN
Start: 2019-09-08 — End: 2019-09-11

## 2019-09-08 MED ORDER — POTASSIUM CHLORIDE 20 MEQ/15 ML PO LIQD
60 meq | Freq: Three times a day (TID) | ORAL | 0 refills | Status: DC
Start: 2019-09-08 — End: 2019-09-09
  Administered 2019-09-08 – 2019-09-09 (×2): 60 meq via ORAL

## 2019-09-08 MED ORDER — LIDOCAINE-EPINEPHRINE 1 %-1:100,000 IJ SOLN
30 mL | Freq: Once | INTRAMUSCULAR | 0 refills | Status: CP
Start: 2019-09-08 — End: ?
  Administered 2019-09-09: 01:00:00 30 mL via INTRAMUSCULAR

## 2019-09-08 MED ORDER — METOPROLOL TARTRATE 50 MG PO TAB
25 mg | Freq: Two times a day (BID) | ORAL | 0 refills | Status: DC
Start: 2019-09-08 — End: 2019-09-09
  Administered 2019-09-09: 01:00:00 25 mg via ORAL

## 2019-09-09 MED ORDER — ONDANSETRON 4 MG PO TBDI
4 mg | Freq: Once | ORAL | 0 refills | Status: CP
Start: 2019-09-09 — End: ?
  Administered 2019-09-09: 08:00:00 4 mg via ORAL

## 2019-09-10 MED ORDER — BUMETANIDE 1 MG PO TAB
2 mg | ORAL_TABLET | Freq: Every day | ORAL | 0 refills | Status: CN
Start: 2019-09-10 — End: ?

## 2019-09-10 MED ORDER — METOPROLOL TARTRATE 25 MG PO TAB
12.5 mg | ORAL_TABLET | Freq: Two times a day (BID) | ORAL | 0 refills | Status: CN
Start: 2019-09-10 — End: ?

## 2019-09-11 ENCOUNTER — Encounter: Admit: 2019-09-11 | Discharge: 2019-09-11 | Payer: MEDICARE

## 2019-09-11 MED ORDER — AMIODARONE 200 MG PO TAB
ORAL_TABLET | Freq: Two times a day (BID) | ORAL | 1 refills | 42.00000 days | Status: AC
Start: 2019-09-11 — End: ?
  Filled 2019-09-11: qty 90, 67d supply, fill #1

## 2019-09-11 MED ORDER — BUMETANIDE 1 MG PO TAB
2 mg | ORAL_TABLET | Freq: Two times a day (BID) | ORAL | 1 refills | Status: DC
Start: 2019-09-11 — End: 2019-10-19
  Filled 2019-09-11: qty 60, 15d supply, fill #1

## 2019-09-11 MED ORDER — ATORVASTATIN 20 MG PO TAB
20 mg | ORAL_TABLET | ORAL | 1 refills | Status: AC
Start: 2019-09-11 — End: ?
  Filled 2019-09-11: qty 15, 30d supply, fill #1

## 2019-09-12 ENCOUNTER — Encounter: Admit: 2019-09-12 | Discharge: 2019-09-12 | Payer: MEDICARE

## 2019-09-12 NOTE — Telephone Encounter
Patient Discharge Date from hospital: 09/11/19  Date Call Attempted: 09/12/19  Number of Attempts: 1  Date Call Completed:  09/12/19    Next Appointment    Next follow-up telehealth appointment on 09/14/19 at 1430 with [name], APRN.    Transportation    NA     Home Health    No    Medications    Pt states he has all his medications and has no questions at this time.    Diet    200 mg cholesterol, 2 G Na, 2-2.5 L fluid restriction,     Scale/Weight    Yes, first am weight today was 157.0.    Signs and Symptoms    Pt reports the following symptoms:     Pt denies BLE edema or abdominal bloating. Pt denies SOA or DOE.     Pt verbalized understanding of signs and symptoms of HF and when to contact a provider or seek immediate assistance at the ER.    Intervention(s)    Pt educated on the importance of weighing daily first thing in the morning after voiding using the same scale in the same location and write results down in note pad or log. Notify us for weight gains of 3 lbs in one day or 5 lbs in one week. Notify us for increased SOA.  Notify us for swelling or increased swelling in BLE or abdominal fullness/bloating. Call 911 for sudden, severe chest/pain pressure/SOA develops. Be sure to make your follow up appointment and bring your weight logs, B/P logs, and medication list with you to your appointment. Call us at [phone] if you have any questions.       Plan of Care    Continued education needed for heart failure symptom management and when to contact our office.

## 2019-09-14 ENCOUNTER — Encounter: Admit: 2019-09-14 | Discharge: 2019-09-14 | Payer: MEDICARE

## 2019-09-14 ENCOUNTER — Ambulatory Visit: Admit: 2019-09-14 | Discharge: 2019-09-15 | Payer: MEDICARE

## 2019-09-14 DIAGNOSIS — I358 Other nonrheumatic aortic valve disorders: Secondary | ICD-10-CM

## 2019-09-14 DIAGNOSIS — E785 Hyperlipidemia, unspecified: Secondary | ICD-10-CM

## 2019-09-14 DIAGNOSIS — J302 Other seasonal allergic rhinitis: Secondary | ICD-10-CM

## 2019-09-14 DIAGNOSIS — I5030 Unspecified diastolic (congestive) heart failure: Secondary | ICD-10-CM

## 2019-09-14 DIAGNOSIS — I4819 Other persistent atrial fibrillation: Secondary | ICD-10-CM

## 2019-09-14 DIAGNOSIS — I10 Essential (primary) hypertension: Secondary | ICD-10-CM

## 2019-09-14 DIAGNOSIS — C4490 Unspecified malignant neoplasm of skin, unspecified: Secondary | ICD-10-CM

## 2019-09-14 DIAGNOSIS — I472 Ventricular tachycardia: Secondary | ICD-10-CM

## 2019-09-14 DIAGNOSIS — E8582 Wild-type transthyretin-related (ATTR) amyloidosis: Secondary | ICD-10-CM

## 2019-09-14 DIAGNOSIS — N184 Chronic kidney disease, stage 4 (severe): Secondary | ICD-10-CM

## 2019-09-14 NOTE — Telephone Encounter
Attempted to call [name] to verify compliance and assess tolerance of his specialty medications (Vyndamax). No answer. Left voicemail asking patient to return call to pharmacist at [phone].    [name], PHARMD

## 2019-09-14 NOTE — Progress Notes
Date of Service: 09/14/2019    Obtained patient's verbal consent to treat them and their agreement to [facility] financial policy and NPP via this telehealth visit during the [facility] Emergency.    This video home visit was conducted via communication between the patient and physician/provider due to COVID-19.  We have found that certain health care needs can be provided without an in-person visit.. This service lets [place] provide the care patients' need.  If a prescription is necessary we can send it directly to their pharmacy.  If testing is necessary this can be arranged.    Ray [name] is a 84 y.o. male.     HPI     Ray [name] was seen today by telehealth from the heart failure clinic for hospital follow-up.  He was recently hospitalized from 6/7 to 09/11/2019 for acute on chronic diastolic heart failure.  He has a past history of wild-type ATTR cardiac amyloidosis, and was in the infusion clinic for diuresis.  However, despite aggressive diuresis, he continued to have significant lower extremity edema as well as he developed an AKI.  Thus, he was admitted for closer monitoring and intense diuresis.  Cardiology was consulted.  He was diuresed with an IV Bumex drip down to a weight of 160 pounds.  He was also having some ventricular tachycardia, and EP was consulted.  He was started on amiodarone 200 mg twice daily, and his PTA diltiazem was discontinued..  He had been on that in the past, but had developed some bradycardia.  At discharge, he was prescribed Bumex 2 mg twice daily, and he was sent home on the amiodarone 200 mg twice daily as well.    He has a past medical history of ATTR wild-type amyloidosis, recently started on tafamidis, heart failure with preserved ejection fraction, atrial fibrillation status post ablation, CAD, hyperlipidemia, chronic kidney disease, and nonsustained ventricular tachycardia.  He did have an echocardiogram completed on 09/06/2019 which revealed an LV EF of 55% with severe concentric LVH, probably severe diastolic dysfunction, dilated RV, biatrial dilatation, moderate MR, moderate to severe TR.    Today, he presents accompanied by his wife via telehealth. He reports that he has been doing really well since his discharge. He reports his legs are perfect. He continues to lose weight, down another 7 pounds since his discharge. On his home scale, his weight was 150 pounds. He denies any dizziness or lightheadedness. He reports that he does tire after being out in the yard doing yard work, but rests, and feels much better inferiorly short amount of time. He denies any dyspnea. He has started his tafamidis, and he is had no side effects from it. His wife is keeping a meticulous record of his sodium intake. She reports he is averaging about 1200 mg of sodium a day. He is drinking close to 64 ounces of fluid on most days.         Telehealth Patient Reported Vitals     Row Name 09/14/19 1437                BP:  122/66        BP Source:  Arm, Left Upper        BP Position:  Sitting        BP Method:  Automatic/Digital Reading        Temp:  36 ?C (96.8 ?F)        Weight:  68 kg (150 lb)  Height:  1.626 m (5' 4)        Pain Score:  Zero                 There is no height or weight on file to calculate BMI.     Past Medical History  Patient Active Problem List    Diagnosis Date Noted   ? 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  ?  01/26/2019 Echo EF:?60%  IVS 1.50 cm (Range: 0.6 - 1.0)         LV PW 1.40 cm (Range: 0.6 - 1.0)         ?  1. Moderate concentric left ventricular hypertrophy  2. Unable to assess diastolic function due to atrial fibrillation  3. Moderate biatrial dilatation  4. Normal right ventricular size and systolic function  5. Normal central venous pressure  6. Mild calcific aortic valve stenosis. ?Mean gradient is 16 mmHg with a peak velocity of 2.8 m/s. ?Trace aortic valve regurgitation is seen  7. Mitral valve is structurally okay. ?There is moderate central regurgitation. ?No stenosis  8. Moderate tricuspid valve regurgitation  9. Pulmonary artery static pressure measures at 33 mmHg   06/28/2019 TC PYP Multiview planar views and SPECT imaging confirms the presence of severe marked diffuse global left ventricular and right ventricular myocardial uptake of tracer. ?There is normal rib and bone uptake.  ?  Two hour heart/contralateral lung (H/CL) ratio: ?1.977?this metric is abnormal and highly suggestive of ATTR cardiac amyloidosis.?  ?  Visual Semi-Quantitative Grading Scale Analysis:?  The study is grade 3 indicating myocardial uptake greater than rib and bone uptake. ?The pattern is highly and strongly suggestive of ATTR cardiac amyloidosis.  ?  SUMMARY/OPINION:   This study is abnormal and strongly suggestive of ATTR cardiac amyloidosis. ?Left ventricular myocardial uptake is diffuse, intense, and global. ?There is also prominent right ventricular free wall uptake of tracer. ?The pattern is typical for ATTR cardiac amyloidosis   ? Cardiac MRI ?   07/25/2019 Kappa/Lambda FLC ?  ? Ref. Range 07/25/2019 15:48   Kappa, FLC Latest Ref Range: 0.33 - 1.94 MG/DL 1.61 (H)   Lambda, FLC Latest Ref Range: 0.57 - 2.63 MG/DL 0.96 (H)   Kappa/Lambda FLC Latest Ref Range: 0.26 - 1.65  1.83 (H)      07/25/2019 Serum Immunofixation ?NO PARAPROTEIN SEEN   cancelled Urine Immunofixation ?Patient unable to provide urine sample 4/26    07/25/2019 Genetic Testing ?Negative   ? Biopsy (Fat Pad, Cardiac, Bone Marrow) ?   ? GI Symptoms ?   ?+? 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   ?  ? Ref. Range 07/25/2019 15:48   B Type Natriuretic Peptide Latest Ref Range: 0 - 100 PG/ML 966.0 (H)   Troponin-I Latest Ref Range: 0.0 - 0.05 NG/ML 0.05   ?  Completed:  -Tafamidis start 08/2019. PA approved, pt approved for [employer] patient assistance from 09/01/2019 to 03/30/2020  -Baseline KCCQ12 completed 5/18/202, Summary Score 61.46  -Baseline 6 min walk completed 08/16/2019, distance walked 950 feet, 289.75 meters, duration of test 6 minutes       ? S/P ablation of atrial fibrillation 05/18/2018   ? Lightheadedness 03/16/2018   ? Hospitalization within last 30 days 12/24/2017   ?  Atrial fibrillation with RVR (HCC) 12/14/2017   ? Stage 3b chronic kidney disease (HCC) 12/13/2017   ? Cardiogenic shock (HCC) 12/12/2017   ? Bradycardia 12/12/2017   ? Bilateral leg edema 12/11/2017   ? On amiodarone therapy 12/11/2017   ? Heart failure with preserved ejection fraction (HCC) 01/01/2016   ? NSVT (nonsustained ventricular tachycardia) (HCC) 06/12/2015   ? Medication side effects 11/09/2014   ? Heart palpitations 09/19/2014   ? Longstanding persistent atrial fibrillation (HCC) 03/17/2013     03/26/2018 - ECHO:  Moderate concentric LVH seen. The EF is about 55%.  The atria are slightly dilated.  The RV function is mildly decreased.  Mild to moderate MR and TR seen.  Mild AI is present.  Based on the gradients and appearance, the AS appears to be mild. The mean gradient is 16 mm Hg with a peak velocity of 2.6 m/s.  03/26/2018 - Zio Patch:  Predominant rhythm was normal sinus.  Two runs of ventricular tachycardia occurred, they were monomorphic, the longest run lasted 3 seconds, this episode occurred on April 08, 2018 at 9:41 PM.  One episode of AIVR also appeared to be present, it lasted approximately 4.6 seconds  Brief and rare episodes of SVT that probably represent atrial fibrillation, the longest lasting 6.3 seconds.  No evidence of atrial flutter and no evidence of high degree AV block.  05/12/2018 - LAAA:  Persistent Atrial Fibrillation status post successful pulmonary vein antral isolation.  Cavotricuspid Isthmus Ablation. Ablation of Fractionated Intracardiac Electrograms.  Ablation for an Additional Discrete Arrhythmia-LA AFL after AFIB/PVI Ablation  06/23/2018 - Cardioversion:  Successful direct current cardioversion from symptomatic atrial fibrillation to sinus rhythm.  03/16/2019 - DCCV:  Successful direct current cardioversion from atrial fibrillation to sinus rhythm.     ? Systolic murmur of aorta 02/04/2013   ? SOB (shortness of breath) 02/04/2013   ? Carotid bruit present 01/23/2012   ? Aortic stenosis, mild 01/30/2011   ? Overweight (BMI 25.0-29.9) 01/30/2011   ? Essential hypertension 01/30/2009   ? Hyperlipidemia 01/30/2009     Hyperlipidemia, on statin therapy     ? Skin cancer 01/30/2009   ? Seasonal allergic reaction 01/30/2009         Review of Systems   Constitution: Negative.   HENT: Negative.    Eyes: Negative.    Cardiovascular: Negative.    Respiratory: Negative.    Endocrine: Negative.    Hematologic/Lymphatic: Negative.    Skin: Negative.    Musculoskeletal: Negative.    Gastrointestinal: Negative.    Genitourinary: Negative.    Neurological: Negative.    Psychiatric/Behavioral: Negative.    Allergic/Immunologic: Negative.    All other systems reviewed and are negative.      Physical exam limited d/t video tele health visit:  General Appearance: patient in no apparent distress.  Skin: pink, no jaundice, exposed skin is intact  Eyes: conjunctivae and lids normal, pupils are equal and round   Lips: no pallor or cyanosis   Neck Veins:    JVD ~5cm  Respiratory Effort: breathing comfortably, no respiratory distress   Lower extremities: no lower extremity edema  Orientation: oriented to time, place and person   PSYCH: Appropriate   Language and Memory: patient responsive and seems to comprehend information   NEURO: Alert and conversant    Cardiovascular Studies      Problems Addressed Today  Encounter Diagnoses   Name Primary?   ? Wild-type transthyretin-related (ATTR) amyloidosis (HCC) Yes   ?  Heart failure with preserved ejection fraction, unspecified HF chronicity (HCC)    ? Persistent atrial fibrillation (HCC)    ? CKD (chronic kidney disease) stage 4, GFR 15-29 ml/min (HCC)    ? NSVT (nonsustained ventricular tachycardia) (HCC)        Assessment and Plan  1. Wild-type transthyretin-related (ATTR) amyloidosis (HCC)   -Started tafamidis 61 mg daily on 09/08/2019  -Cardiopulmonary stress test has been ordered as part of his plan  -He will follow up with [name], APRN, next week   2. Heart failure with preserved ejection fraction, unspecified HF chronicity (HCC)   -Describes euvolemia on Bumex 2 mg twice daily  -Blood pressure controlled on current medications   3. Persistent atrial fibrillation (HCC)  -Anticoagulated with Xarelto 15 mg daily   4. CKD (chronic kidney disease) stage 4, GFR 15-29 ml/min (HCC)   - creatinine on the day of discharge 3.38  - recent trends of creatinine ~2.0  - repeat BMP   5. NSVT (nonsustained ventricular tachycardia) (HCC)   - started amiodarone during hospitalization and is currently on a taper  - initial monitoring labs/studies will be obtained       Plan:  The following is a copy of the written instructions and plan I gave to the patient during his office visit.    Patient Instructions       Thank you for coming to [facility]. Your instructions today:     1.  Recommendations: no changes in medications currently  2. Please get BMP drawn in the next several days.  We will send an order to the hospital in [address], [place]  3. Dr. [name] would also like you to complete a cardiopulmonary stress test as part of your therapy for amyloid.  4.  Next follow up appointment: 6/23 with [name], APRN      For up to date information on the COVID-19 virus, visit the [facility] website.  ? General supportive care during cold and flu season and infection prevention reminders:   o Wash hands often with soap and water for at least 20 seconds  o Cover your mouth and nose  o Stay home if sick and symptoms mild or manageable  - If you must be around people wear a mask    ? If you are having symptoms of a lower respiratory infection (cough, shortness of breath) and/or fever AND either traveled in last 30 days (internationally or to region of exposure) OR known exposure to patient with COVID19:    o Call your primary care provider for questions or health needs.   - Tell your doctor about your recent travel and your symptoms    o In a medical emergency, call 911 or go to the nearest emergency room.      If you wish to contact us, please call and leave a message for the heart failure nurses at [phone].   To schedule or change an appointment call [phone].    Renita Papa, MD, MSc  [name], APRN  Elayne Snare, RN  [facility]  Phone: [phone]   Fax: [phone]  Scheduling: [phone]    Your Heart Failure Symptom Awareness and Action Plan  Every Day Action Plan  ? Weigh yourself in the morning before breakfast. Write it down and compare it to yesterday's weight.  ? Take your medicine, as prescribed. Please call if you have concerns about the side effects,  cost or refills.  ? Check for worsened swelling in your feet, ankles and stomach  ? Follow a 2000mg  salt diet.  ? Keep all healthcare appointments    Green Zone   Good! Symptoms are under control ? No shortness of breath  ? No increase in ankle swelling  ? No weight gain  ? No chest pain  ? No change in your usual activity  ? Continue to follow your everyday action plan.    Yellow Zone  If you have any of these symptoms, please call the heart failure nurses: [phone] ? Increased shortness of breath with activity  ? Weight gain of 3 pounds in one day or 5 pounds in a week  ? Increased swelling in your ankles or legs  ? Increased swelling in your stomach  ? Increasing fatigue  ? You may need an adjustment of your medications.    Red Zone  These are urgent symptoms. Please call the heart failure nurses: [phone] ? Shortness of breath at rest or waking up at night feeling short of breath or coughing  ? Increased number of pillows used or needing to sit upright to sleep  ? Chest tightness at rest  ? Dizziness, lightheadedness or feeling faint You need to schedule an appointment   Emergency Zone ? call 911 ? Worsening chest tightness or pain that is not relieved by medication  ? Severe shortness of breath and a cough with pink, frothy sputum           Future Appointments   Date Time Provider Department Center   09/21/2019 11:00 AM Fredricka Bonine, APRN-NP [id number] Exam   12/20/2019  2:00 PM [name], MD [id number] IM   06/19/2020  1:00 PM [name], MD [facility] IM           Education/counseling time spent: 25 minutes of 35 minute visit  Topics:  HF disease process, Treatment options, Treatment risks and benefits, Medication instructions, Medication interactions, Daily weight monitoring, Sodium and fluid restriction, Personalized exercise guidelines, , Knows HF symptoms, Knows when to call, Knows who to call, Knows next appt.           Current Medications (including today's revisions)  ? amiodarone (CORDARONE) 200 mg tablet Take 2 tablets by mouth twice daily through 6/16.  Starting 6/17 take 2 tablets by mouth daily for 2 weeks then starting 7/1 take 1 tablet by mouth daily. Take with food.   ? atorvastatin (LIPITOR) 20 mg tablet Take one tablet by mouth every 48 hours.   ? bumetanide (BUMEX) 1 mg tablet Take two tablets by mouth twice daily.   ? cholecalciferol (vitamin D3) (VITAMIN D3 PO) Take 1 tablet by mouth daily.   ? docusate (COLACE) 100 mg capsule Take 100 mg by mouth daily.   ? esomeprazole DR(+) (NEXIUM) 20 mg capsule Take 20 mg by mouth daily. Take on an empty stomach at least 1 hour before or 2 hours after food.   ? ferrous sulfate (FEOSOL) 325 mg (65 mg iron) tablet Take 325 mg by mouth twice daily. Take on an empty stomach at least 1 hour before or 2 hours after food.    ? fish oil /omega-3 fatty acids (SEA-OMEGA) 340/1000 mg capsule Take 1 capsule by mouth every 48 hours.   ? fluticasone/salmeterol (ADVAIR) 100/50 mcg inhalation disk Inhale 1 puff by mouth into the lungs twice daily.   ? lactobacillus rhamnosus (GG) (CULTURELLE) 10 billion cell cap Take 1 capsule by  mouth daily with breakfast.   ? magnesium oxide (MAG-OX) 400 mg (241.3 mg magnesium) tablet Take one tablet by mouth daily.   ? potassium chloride SR (K-DUR) 20 mEq tablet Take two tablets by mouth daily. Take with a meal and a full glass of water   ? sildenafiL (VIAGRA) 100 mg tablet TAKE 1 TABLET BY MOUTH ONCE DAILY AS NEEDED FOR SEXUAL ACTIVITY   ? tafamidis (VYNDAMAX) 61 mg capsule Take one capsule by mouth daily.   ? VENTOLIN HFA 90 mcg/actuation inhaler Inhale 1 puff by mouth into the lungs as Needed.   ? XARELTO 15 mg tablet Take 1 tablet by mouth once daily with food     Telehealth Patient Reported Vitals     Row Name 09/14/19 1437                BP:  122/66        BP Source:  Arm, Left Upper        BP Position:  Sitting        BP Method:  Automatic/Digital Reading        Temp:  36 ?C (96.8 ?F)        Weight:  68 kg (150 lb)        Height:  1.626 m (5' 4)        Pain Score:  Zero

## 2019-09-15 ENCOUNTER — Encounter: Admit: 2019-09-15 | Discharge: 2019-09-15 | Payer: MEDICARE

## 2019-09-21 ENCOUNTER — Encounter: Admit: 2019-09-21 | Discharge: 2019-09-21 | Payer: MEDICARE

## 2019-09-21 ENCOUNTER — Ambulatory Visit: Admit: 2019-09-21 | Discharge: 2019-09-22 | Payer: MEDICARE

## 2019-09-21 DIAGNOSIS — I5030 Unspecified diastolic (congestive) heart failure: Secondary | ICD-10-CM

## 2019-09-21 DIAGNOSIS — I350 Nonrheumatic aortic (valve) stenosis: Secondary | ICD-10-CM

## 2019-09-21 DIAGNOSIS — I10 Essential (primary) hypertension: Secondary | ICD-10-CM

## 2019-09-21 DIAGNOSIS — C4490 Unspecified malignant neoplasm of skin, unspecified: Secondary | ICD-10-CM

## 2019-09-21 DIAGNOSIS — J302 Other seasonal allergic rhinitis: Secondary | ICD-10-CM

## 2019-09-21 DIAGNOSIS — I358 Other nonrheumatic aortic valve disorders: Secondary | ICD-10-CM

## 2019-09-21 DIAGNOSIS — E785 Hyperlipidemia, unspecified: Secondary | ICD-10-CM

## 2019-09-21 DIAGNOSIS — I5033 Acute on chronic diastolic (congestive) heart failure: Secondary | ICD-10-CM

## 2019-09-21 LAB — COMPREHENSIVE METABOLIC PANEL
Lab: 1.2 mg/dL (ref 0.3–1.2)
Lab: 116 mg/dL — ABNORMAL HIGH (ref 70–100)
Lab: 138 MMOL/L (ref 137–147)
Lab: 175 U/L — ABNORMAL HIGH (ref 25–110)
Lab: 2.1 mg/dL — ABNORMAL HIGH (ref 0.4–1.24)
Lab: 23 U/L (ref 7–40)
Lab: 27 MMOL/L (ref 21–30)
Lab: 3.9 g/dL (ref 3.5–5.0)
Lab: 30 mL/min — ABNORMAL LOW (ref >60)
Lab: 37 mL/min — ABNORMAL LOW (ref >60)
Lab: 4.3 MMOL/L (ref 3.5–5.1)
Lab: 48 mg/dL — ABNORMAL HIGH (ref 7–25)
Lab: 7.2 g/dL (ref 6.0–8.0)
Lab: 9.1 mg/dL (ref 8.5–10.6)
Lab: 99 MMOL/L (ref 98–110)
Lab: 12 (ref 3–12)

## 2019-09-21 LAB — BNP (B-TYPE NATRIURETIC PEPTI): Lab: 125 pg/mL — ABNORMAL HIGH (ref 0–100)

## 2019-09-21 LAB — MAGNESIUM: Lab: 2.3 mg/dL (ref 1.6–2.6)

## 2019-09-21 NOTE — Progress Notes
Date of Service: 09/21/2019    Ray [name] is a 84 y.o. male.       HPI     I had the pleasure of seeing Gabino Swailes, who is accompanied by his wife today in [facility] for amyloid follow-up.  He was last seen in clinic by   [name], APRN on 6/16 for post hospital follow-up. Of note he had recently been hospitalized at  from 6/7 through 6/13 for acute diastolic heart failure. 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  At the last office visit his weight was 150 pounds, blood pressure was stable at 122/66.  He had been started on tafamidis on 09/08/2019.     Today he tells me that he is feeling better every day.  He is trying to get his strength back, he is able to walk about 07-7998 steps a day.  He is occasionally lightheaded when he does abdominal crunches, they tell me that he does 2 hours a day.  He reports that he is watching his sodium, and he has a good appetite.  On day of discharge his weight was 157 pounds, the next it was 152.  He denies any peripheral edema, he does have some redness in his lower legs.  He has seen dermatology at [facility], he had a Mohs procedure on his lower back, he tells me that surgery may removed the sutures, his wife has been packing that lower back and changing his bandage.              Vitals:    09/21/19 1050 09/21/19 1054   BP: 112/72    BP Source: Arm, Right Upper    Patient Position: Sitting    Pulse: (!) 121 99   Temp: 36.1 ?C (97 ?F)    TempSrc: Skin    SpO2: 98% 98%   Weight: 69.8 kg (153 lb 12.8 oz)    Height: 1.727 m (5' 8)    PainSc: Zero      Body mass index is 23.39 kg/m?[name]     Past Medical History  Patient Active Problem List    Diagnosis Date Noted   ? Wild-type transthyretin-related (ATTR) amyloidosis (HCC) 08/16/2019 Cardiac Amyloid Evaluation:??  ?  01/26/2019 Echo EF:?60%  IVS 1.50 cm (Range: 0.6 - 1.0)         LV PW 1.40 cm (Range: 0.6 - 1.0)         ?  1. Moderate concentric left ventricular hypertrophy  2. Unable to assess diastolic function due to atrial fibrillation  3. Moderate biatrial dilatation  4. Normal right ventricular size and systolic function  5. Normal central venous pressure  6. Mild calcific aortic valve stenosis. ?Mean gradient is 16 mmHg with a peak velocity of 2.8 m/s. ?Trace aortic valve regurgitation is seen  7. Mitral valve is structurally okay. ?There is moderate central regurgitation. ?No stenosis  8. Moderate tricuspid valve regurgitation  9. Pulmonary artery static pressure measures at 33 mmHg   06/28/2019 TC PYP Multiview planar views and SPECT imaging confirms the presence of severe marked diffuse global left ventricular and right ventricular myocardial uptake of tracer. ?There is normal rib and bone uptake.  ?  Two hour heart/contralateral lung (H/CL) ratio: ?1.977?this metric is abnormal and highly suggestive of ATTR cardiac amyloidosis.?  ?  Visual Semi-Quantitative Grading Scale Analysis:?  The study is grade 3 indicating myocardial uptake greater than rib and bone uptake. ?The pattern is highly and strongly suggestive of ATTR cardiac amyloidosis.  ?  SUMMARY/OPINION:   This study is abnormal and strongly suggestive of ATTR cardiac amyloidosis. ?Left ventricular myocardial uptake is diffuse, intense, and global. ?There is also prominent right ventricular free wall uptake of tracer. ?The pattern is typical for ATTR cardiac amyloidosis   ? Cardiac MRI ?   07/25/2019 Kappa/Lambda FLC ?  ? Ref. Range 07/25/2019 15:48   Kappa, FLC Latest Ref Range: 0.33 - 1.94 MG/DL 1.61 (H)   Lambda, FLC Latest Ref Range: 0.57 - 2.63 MG/DL 0.96 (H)   Kappa/Lambda FLC Latest Ref Range: 0.26 - 1.65  1.83 (H)      07/25/2019 Serum Immunofixation ?NO PARAPROTEIN SEEN   cancelled Urine Immunofixation ?Patient unable to provide urine sample 4/26    07/25/2019 Genetic Testing ?Negative   ? Biopsy (Fat Pad, Cardiac, Bone Marrow) ?   ? GI Symptoms ?   ?+? 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   ?  ? Ref. Range 07/25/2019 15:48   B Type Natriuretic Peptide Latest Ref Range: 0 - 100 PG/ML 966.0 (H)   Troponin-I Latest Ref Range: 0.0 - 0.05 NG/ML 0.05   ?  Completed:  -Tafamidis start 08/2019. PA approved, pt approved for [facility] patient assistance from 09/01/2019 to 03/30/2020  -Baseline KCCQ12 completed 5/18/202, Summary Score 61.46  -Baseline 6 min walk completed 08/16/2019, distance walked 950 feet, 289.75 meters, duration of test 6 minutes       ? S/P ablation of atrial fibrillation 05/18/2018   ? Lightheadedness 03/16/2018   ? Hospitalization within last 30 days 12/24/2017   ? Atrial fibrillation with RVR (HCC) 12/14/2017   ? Stage 3b chronic kidney disease (HCC) 12/13/2017   ? Cardiogenic shock (HCC) 12/12/2017   ? Bradycardia 12/12/2017   ? Bilateral leg edema 12/11/2017   ? On amiodarone therapy 12/11/2017   ? Heart failure with preserved ejection fraction (HCC) 01/01/2016   ? NSVT (nonsustained ventricular tachycardia) (HCC) 06/12/2015   ? Medication side effects 11/09/2014   ? Heart palpitations 09/19/2014   ? Longstanding persistent atrial fibrillation (HCC) 03/17/2013     03/26/2018 - ECHO:  Moderate concentric LVH seen. The EF is about 55%.  The atria are slightly dilated.  The RV function is mildly decreased.  Mild to moderate MR and TR seen.  Mild AI is present.  Based on the gradients and appearance, the AS appears to be mild. The mean gradient is 16 mm Hg with a peak velocity of 2.6 m/s.  03/26/2018 - Zio Patch:  Predominant rhythm was normal sinus.  Two runs of ventricular tachycardia occurred, they were monomorphic, the longest run lasted 3 seconds, this episode occurred on April 08, 2018 at 9:41 PM.  One episode of AIVR also appeared to be present, it lasted approximately 4.6 seconds  Brief and rare episodes of SVT that probably represent atrial fibrillation, the longest lasting 6.3 seconds.  No evidence of atrial flutter and no evidence of high degree AV block.  05/12/2018 - LAAA:  Persistent Atrial Fibrillation status post successful pulmonary vein antral isolation.  Cavotricuspid Isthmus Ablation.  Ablation of Fractionated Intracardiac Electrograms.  Ablation for an Additional Discrete  Arrhythmia-LA AFL after AFIB/PVI Ablation  06/23/2018 - Cardioversion:  Successful direct current cardioversion from symptomatic atrial fibrillation to sinus rhythm.  03/16/2019 - DCCV:  Successful direct current cardioversion from atrial fibrillation to sinus rhythm.     ? Systolic murmur of aorta 02/04/2013   ? SOB (shortness of breath) 02/04/2013   ? Carotid bruit present 01/23/2012   ? Aortic stenosis, mild 01/30/2011   ? Overweight (BMI 25.0-29.9) 01/30/2011   ? Essential hypertension 01/30/2009   ? Hyperlipidemia 01/30/2009     Hyperlipidemia, on statin therapy     ? Skin cancer 01/30/2009   ? Seasonal allergic reaction 01/30/2009         Review of Systems   Constitution: Positive for malaise/fatigue.   HENT: Negative.    Eyes: Negative.    Cardiovascular: Negative.    Respiratory: Negative.    Endocrine: Negative.    Hematologic/Lymphatic: Negative.    Skin: Negative.    Musculoskeletal: Negative.    Gastrointestinal: Negative.    Genitourinary: Negative.    Neurological: Negative.    Psychiatric/Behavioral: Negative.    Allergic/Immunologic: Negative.        Physical Exam  General Appearance: In NAD  Neck Veins: normal JVP, neck veins are not distended; no HJR   Chest Inspection: chest is normal in appearance   Respiratory Effort: breathing comfortably, no respiratory distress   Auscultation/Percussion: lungs clear to auscultation, no rales, rhonchi or wheezing   Cardiac Rhythm: regular rhythm and normal rate   Cardiac Auscultation: S1, S2 normal, no rub, no definite S3  or S4   Murmurs: grade iii/vi systolic murmur   Peripheral Circulation: normal peripheral circulation   Pedal Pulses: normal symmetric pedal pulses   Lower Extremity Edema: 1+ left lower extremity edema at the ankle, bilateral lower legs have venous discoloration  Abdominal Exam: soft, non-tender, no obvious masses, bowel sounds normal   Gait & Station: walks without assistance   Orientation: oriented to person, place and time   Affect & Mood: appropriate and sustained affect   Language and Memory: patient responsive and seems to comprehend information   Neurologic Exam: neurological assessment grossly intact   Vital signs were reviewed  Lower back incision dressing removed and replaced, moh's procedure tunneled    Cardiovascular Studies echo 09/06/2019    ? The underlying rhythm is atrial fibrillation.  ? Normal left ventricular systolic function, estimated ejection fraction is 55%.  ? Severe concentric LVH, patient has known amyloidosis.  ? Probably severe diastolic dysfunction, elevated filling pressure.  ? The right ventricle is dilated, hypertrophy of the free wall, decreased systolic function.M-Mode TAPSE 1.2 cm (normal >1.7 cm).  ? Biatrial dilatation (LA?mildly dilated, RA?moderately dilated).  ? Moderate mitral valve regurgitation, moderate to severe tricuspid valve regurgitation.  ? Severely sclerotic aortic valve, by spectral Doppler calculations mild stenosis (MG= 12 mmHg, peak velocity= 2.3 m/sec, DI= 0.27 , AVA= 1.1 cm?), trace regurgitation. By 2D echo exam only, the aortic valve systolic opening is significantly restricted, this finding is compatible with severe aortic valve stenosis. (NF-LG severe AS).  ? Estimated Peak Systolic PA Pressure 31 mmHg  Problems Addressed Today  Encounter Diagnoses   Name Primary?   ? Aortic stenosis, mild Yes Assessment and Plan   1.  Wild-type amyloidosis: He has had a previous technetium pyrophosphate scan on 06/28/2019 that showed a 2-hour heart/contralateral lung ratio of 1.97 with visual semiquantitative grading grade 3, abnormal and highly suggestive of ATTR cardiac amyloidosis.  He has had kappa 7.45,  lambda 4.07 and free light chain ratio 1.83 on 4/26.  He had serum electrophoresis that showed no paraprotein.  He did a 6-minute walk test on 5/18, he was able to walk 289.75 m.  He has completed a KCC Q score today that was 72.92 indicative of fair to good quality of life.  He has been started on tafamidis 61 mg daily.  Will get a baseline CPET on 7/19 I have asked for follow-up in clinic in 1 month with me.  No medications were changed today.  2.  Heart failure with preserved ejection fraction: He appears euvolemic on physical exam, he continues to take Bumex 2 mg p.o. twice daily.  3.  Aortic stenosis: The most recent echocardiogram on 6/8 showed severely sclerotic aortic valve by spectral Doppler calculation mild stenosis with mean gradient of 12 mmHg, the AVA is 1.1 cm.  I discussed this with Dr. [name], will continue to monitor this in the future.  4.  CKD: His creatinine on 6/13 was 3.38, it has improved to 2.10 today.  His BNP today is markedly improved to 1253.  He is scheduled for follow-up with Dr. Mercie Eon on September 21.  5.  Atrial fibrillation: He continues to take amiodarone 200 mg p.o. twice daily, he is on oral anticoagulation with Xarelto 15 mg daily, he denies any upper or lower GI bleeding symptoms.  _6.  Skin cancer (basal cell) status post Mohs procedure at [facility].  His wife continues to pack the wound, I have sent a referral for wound care clinic.  ________________________________________________________________________________  DISEASE PREVENTION:   Lipids/Statin treatment:  Yes.   Currently on atorvastatin 20 mg daily   for Goal LDL < 70, Triglycerides < 433 mg/dl.   HTN: Optimally controlled on current medical therapy.  Goal Systolic < 130, diastolic < 90.     Diabetes:  Denies     Tobacco: Denies        Obesity/Nutrition/Physical Activity: BMI 23.39  ____________________________________________________________________________________      I have personally documented the HPI, exam and medical decision making.  Patient education: I reviewed recent lab results and current medications, medication instructions, discussed heart failure signs & symptoms,  low  sodium diet, fluid restriction and daily weights.I have instructed the patient on the plan of care and they verbalize understanding of the plan. Please see AVS for full patient teaching. Patient advised to call our office if s/he has any problems, questions, worsening symptoms, or concerns prior to the next appointment.   Thanks for allowing me to see this nice patient. If I can be of additional assistance, please don't hesitate to contact me.     [name] AGPCNP-BC, [facility]  Pager [phone]  Collaborating physician: Dr. Vanetta Shawl             Current Medications (including today's revisions)  ? amiodarone (CORDARONE) 200 mg tablet Take 2 tablets by mouth twice daily through 6/16.  Starting 6/17 take 2 tablets by mouth daily for 2 weeks then starting 7/1 take 1 tablet by mouth daily. Take with food.   ? atorvastatin (LIPITOR) 20 mg tablet Take one tablet by mouth every 48 hours.   ? bumetanide (BUMEX) 1 mg tablet Take two tablets by mouth twice daily.   ? cholecalciferol (vitamin D3) (VITAMIN D3 PO) Take 1 tablet by mouth daily.   ? docusate (COLACE) 100 mg capsule Take 100 mg by mouth daily.   ? esomeprazole DR(+) (NEXIUM) 20 mg capsule Take 20 mg by  mouth daily. Take on an empty stomach at least 1 hour before or 2 hours after food.   ? ferrous sulfate (FEOSOL) 325 mg (65 mg iron) tablet Take 325 mg by mouth twice daily. Take on an empty stomach at least 1 hour before or 2 hours after food.    ? fish oil /omega-3 fatty acids (SEA-OMEGA) 340/1000 mg capsule Take 1 capsule by mouth every 48 hours.   ? fluticasone/salmeterol (ADVAIR) 100/50 mcg inhalation disk Inhale 1 puff by mouth into the lungs twice daily.   ? lactobacillus rhamnosus (GG) (CULTURELLE) 10 billion cell cap Take 1 capsule by mouth daily with breakfast.   ? magnesium oxide (MAG-OX) 400 mg (241.3 mg magnesium) tablet Take one tablet by mouth daily.   ? potassium chloride SR (K-DUR) 20 mEq tablet Take two tablets by mouth daily. Take with a meal and a full glass of water   ? sildenafiL (VIAGRA) 100 mg tablet TAKE 1 TABLET BY MOUTH ONCE DAILY AS NEEDED FOR SEXUAL ACTIVITY   ? tafamidis (VYNDAMAX) 61 mg capsule Take one capsule by mouth daily.   ? VENTOLIN HFA 90 mcg/actuation inhaler Inhale 1 puff by mouth into the lungs as Needed.   ? XARELTO 15 mg tablet Take 1 tablet by mouth once daily with food

## 2019-09-22 DIAGNOSIS — Z9889 Other specified postprocedural states: Secondary | ICD-10-CM

## 2019-09-22 DIAGNOSIS — I358 Other nonrheumatic aortic valve disorders: Secondary | ICD-10-CM

## 2019-09-22 DIAGNOSIS — I472 Ventricular tachycardia: Secondary | ICD-10-CM

## 2019-09-22 DIAGNOSIS — Z8679 Personal history of other diseases of the circulatory system: Secondary | ICD-10-CM

## 2019-09-22 LAB — TSH WITH FREE T4 REFLEX: Lab: 2.5 uU/mL (ref 0.35–5.00)

## 2019-09-29 ENCOUNTER — Encounter: Admit: 2019-09-29 | Discharge: 2019-09-29 | Payer: MEDICARE

## 2019-10-17 ENCOUNTER — Ambulatory Visit: Admit: 2019-10-17 | Discharge: 2019-10-17 | Payer: MEDICARE

## 2019-10-17 ENCOUNTER — Encounter: Admit: 2019-10-17 | Discharge: 2019-10-17 | Payer: MEDICARE

## 2019-10-17 DIAGNOSIS — I358 Other nonrheumatic aortic valve disorders: Secondary | ICD-10-CM

## 2019-10-17 DIAGNOSIS — I5030 Unspecified diastolic (congestive) heart failure: Secondary | ICD-10-CM

## 2019-10-17 DIAGNOSIS — Z9889 Other specified postprocedural states: Secondary | ICD-10-CM

## 2019-10-17 DIAGNOSIS — I472 Ventricular tachycardia: Secondary | ICD-10-CM

## 2019-10-17 DIAGNOSIS — E8582 Wild-type transthyretin-related (ATTR) amyloidosis: Secondary | ICD-10-CM

## 2019-10-17 DIAGNOSIS — I350 Nonrheumatic aortic (valve) stenosis: Secondary | ICD-10-CM

## 2019-10-17 LAB — METHEMOGLOBIN: Lab: 0.4 % (ref <1.5)

## 2019-10-17 LAB — BLOOD GASES, ARTERIAL
Lab: 104 mmHg — ABNORMAL HIGH (ref 80–100)
Lab: 35 mmHg (ref 35–45)
Lab: 98 % (ref 95–99)
Lab: 7.5 — ABNORMAL HIGH (ref 7.35–7.45)

## 2019-10-17 LAB — HEMOGLOBIN & HEMATOCRIT, BG
Lab: 13 g/dL — ABNORMAL HIGH (ref 13.5–16.5)
Lab: 41 % (ref 40–50)

## 2019-10-17 LAB — CARBON MONOXIDE,BG: Lab: 1.1 % (ref <2.0)

## 2019-10-19 ENCOUNTER — Encounter: Admit: 2019-10-19 | Discharge: 2019-10-19 | Payer: MEDICARE

## 2019-10-19 MED ORDER — BUMETANIDE 1 MG PO TAB
2 mg | ORAL_TABLET | Freq: Two times a day (BID) | ORAL | 3 refills | Status: DC
Start: 2019-10-19 — End: 2019-10-24

## 2019-10-24 ENCOUNTER — Encounter: Admit: 2019-10-24 | Discharge: 2019-10-24 | Payer: MEDICARE

## 2019-10-24 ENCOUNTER — Ambulatory Visit: Admit: 2019-10-24 | Discharge: 2019-10-25 | Payer: MEDICARE

## 2019-10-24 DIAGNOSIS — I5030 Unspecified diastolic (congestive) heart failure: Secondary | ICD-10-CM

## 2019-10-24 DIAGNOSIS — J302 Other seasonal allergic rhinitis: Secondary | ICD-10-CM

## 2019-10-24 DIAGNOSIS — E785 Hyperlipidemia, unspecified: Secondary | ICD-10-CM

## 2019-10-24 DIAGNOSIS — I10 Essential (primary) hypertension: Principal | ICD-10-CM

## 2019-10-24 DIAGNOSIS — I358 Other nonrheumatic aortic valve disorders: Secondary | ICD-10-CM

## 2019-10-24 DIAGNOSIS — C4490 Unspecified malignant neoplasm of skin, unspecified: Secondary | ICD-10-CM

## 2019-10-24 MED ORDER — BUMETANIDE 1 MG PO TAB
3 mg | ORAL_TABLET | Freq: Two times a day (BID) | ORAL | 3 refills | Status: DC
Start: 2019-10-24 — End: 2019-10-28

## 2019-10-24 NOTE — Progress Notes
Date of Service: 10/24/2019    Ray [name] is a 84 y.o. male.       HPI      I had the pleasure of seeing Ray [name], who is accompanied by his wife today in [place] [facility] for amyloidosis follow-up.  I had last seen him in clinic on 6/23.  He was scheduled to have a CPET test on 7/19, no medications were changed at last office visit.  In addition to amyloidosis, he also has heart failure with preserved ejection fraction, aortic stenosis, CKD, atrial fibrillation and basal skin cell cancer.    Today he reports started 7 days days ago states his home blood pressure is usually 117/70 today, pulse rate has averaged between 80 to 90 bpm.  He does feel fatigued.  He is wondering if this has anything to do with the reduction in his amiodarone dose (amiodarone taper per protocol at the same.  He continues to workout with both weights as well as walking, he has been working in his garden and has been Audiological scientist (he is very aware of low-sodium diet and can tell you what his average sodium intake is), in addition he is adherent to fluid restriction.  He states that he is not sleeping very well, his wife thinks that he is anxious    His CPET showed a peak VO2 max Peak VO2 of 877 mils per minute the anaerobic threshold was 54% of predicted, anaerobic threshold 38% of predicted VO2 max         Vitals:    10/24/19 1256 10/24/19 1400 10/24/19 1401   BP: (!) 80/58 110/58 108/56   BP Source: Arm, Left Upper Arm, Right Upper Arm, Left Upper   Pulse: 83     SpO2: 98%     Weight: 71.6 kg (157 lb 14.4 oz)     Height: 1.727 m (5' 8)     PainSc: Zero       Body mass index is 24.01 kg/m?[name]     Past Medical History  Patient Active Problem List    Diagnosis Date Noted   ? 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  ?  01/26/2019 Echo EF:?60%  IVS 1.50 cm (Range: 0.6 - 1.0)         LV PW 1.40 cm (Range: 0.6 - 1.0) ?  1. Moderate concentric left ventricular hypertrophy  2. Unable to assess diastolic function due to atrial fibrillation  3. Moderate biatrial dilatation  4. Normal right ventricular size and systolic function  5. Normal central venous pressure  6. Mild calcific aortic valve stenosis. ?Mean gradient is 16 mmHg with a peak velocity of 2.8 m/s. ?Trace aortic valve regurgitation is seen  7. Mitral valve is structurally okay. ?There is moderate central regurgitation. ?No stenosis  8. Moderate tricuspid valve regurgitation  9. Pulmonary artery static pressure measures at 33 mmHg   06/28/2019 TC PYP Multiview planar views and SPECT imaging confirms the presence of severe marked diffuse global left ventricular and right ventricular myocardial uptake of tracer. ?There is normal rib and bone uptake.  ?  Two hour heart/contralateral lung (H/CL) ratio: ?1.977?this metric is abnormal and highly suggestive of ATTR cardiac amyloidosis.?  ?  Visual Semi-Quantitative Grading Scale Analysis:?  The study is grade 3 indicating myocardial uptake greater than rib and bone uptake. ?The pattern is highly and strongly suggestive of ATTR cardiac amyloidosis.  ?  SUMMARY/OPINION:   This study is abnormal and strongly suggestive of ATTR cardiac amyloidosis. ?Left ventricular myocardial uptake is diffuse, intense, and global. ?There is also prominent right ventricular free wall uptake of tracer. ?The pattern is typical for ATTR cardiac amyloidosis   ? Cardiac MRI ?   07/25/2019 Kappa/Lambda FLC ?  ? Ref. Range 07/25/2019 15:48   Kappa, FLC Latest Ref Range: 0.33 - 1.94 MG/DL 1.61 (H)   Lambda, FLC Latest Ref Range: 0.57 - 2.63 MG/DL 0.96 (H)   Kappa/Lambda FLC Latest Ref Range: 0.26 - 1.65  1.83 (H)      07/25/2019 Serum Immunofixation ?NO PARAPROTEIN SEEN   cancelled Urine Immunofixation ?Patient unable to provide urine sample 4/26    07/25/2019 Genetic Testing ?Negative   ? Biopsy (Fat Pad, Cardiac, Bone Marrow) ?   ? GI Symptoms ?   ?+? 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   ?  ? Ref. Range 07/25/2019 15:48   B Type Natriuretic Peptide Latest Ref Range: 0 - 100 PG/ML 966.0 (H)   Troponin-I Latest Ref Range: 0.0 - 0.05 NG/ML 0.05   ?  Completed:  -Tafamidis start 08/2019. PA approved, pt approved for [facility] patient assistance from 09/01/2019 to 03/30/2020  -Baseline KCCQ12 completed 5/18/202, Summary Score 61.46  -Baseline 6 min walk completed 08/16/2019, distance walked 950 feet, 289.75 meters, duration of test 6 minutes  -Baseline CPET completed 10/17/2019       ? S/P ablation of atrial fibrillation 05/18/2018   ? Lightheadedness 03/16/2018   ? Hospitalization within last 30 days 12/24/2017   ? Atrial fibrillation with RVR (HCC) 12/14/2017   ? Stage 3b chronic kidney disease (HCC) 12/13/2017   ? Cardiogenic shock (HCC) 12/12/2017   ? Bradycardia 12/12/2017   ? Bilateral leg edema 12/11/2017   ? On amiodarone therapy 12/11/2017   ? Heart failure with preserved ejection fraction (HCC) 01/01/2016   ? NSVT (nonsustained ventricular tachycardia) (HCC) 06/12/2015   ? Medication side effects 11/09/2014   ? Heart palpitations 09/19/2014   ? Longstanding persistent atrial fibrillation (HCC) 03/17/2013     03/26/2018 - ECHO:  Moderate concentric LVH seen. The EF is about 55%.  The atria are slightly dilated.  The RV function is mildly decreased.  Mild to moderate MR and TR seen.  Mild AI is present.  Based on the gradients and appearance, the AS appears to be mild. The mean gradient is 16 mm Hg with a peak velocity of 2.6 m/s.  03/26/2018 - Zio Patch:  Predominant rhythm was normal sinus.  Two runs of ventricular tachycardia occurred, they were monomorphic, the longest run lasted 3 seconds, this episode occurred on April 08, 2018 at 9:41 PM. One episode of AIVR also appeared to be present, it lasted approximately 4.6 seconds  Brief and rare episodes of SVT that probably represent atrial fibrillation, the longest lasting 6.3 seconds.  No evidence of atrial flutter and no evidence of high degree AV block.  05/12/2018 - LAAA:  Persistent Atrial Fibrillation status post successful pulmonary vein antral isolation.  Cavotricuspid Isthmus Ablation.  Ablation of Fractionated Intracardiac Electrograms.  Ablation for an Additional Discrete Arrhythmia-LA AFL after AFIB/PVI Ablation  06/23/2018 - Cardioversion:  Successful direct current cardioversion from symptomatic atrial fibrillation to sinus rhythm.  03/16/2019 - DCCV:  Successful direct current cardioversion  from atrial fibrillation to sinus rhythm.     ? Systolic murmur of aorta 02/04/2013   ? SOB (shortness of breath) 02/04/2013   ? Carotid bruit present 01/23/2012   ? Aortic stenosis, mild 01/30/2011   ? Overweight (BMI 25.0-29.9) 01/30/2011   ? Essential hypertension 01/30/2009   ? Hyperlipidemia 01/30/2009     Hyperlipidemia, on statin therapy     ? Skin cancer 01/30/2009   ? Seasonal allergic reaction 01/30/2009         Review of Systems   Constitution: Positive for malaise/fatigue.   HENT: Negative.    Eyes: Negative.    Cardiovascular: Positive for leg swelling.   Respiratory: Negative.    Endocrine: Negative.    Hematologic/Lymphatic: Negative.    Skin: Negative.    Musculoskeletal: Negative.    Gastrointestinal: Negative.    Genitourinary: Negative.    Neurological: Negative.    Psychiatric/Behavioral: Negative.    Allergic/Immunologic: Negative.        Physical Exam  General Appearance: In NAD  Neck Veins: 9 cm JVP, neck veins are distended, + HJR   Chest Inspection: chest is normal in appearance   Respiratory Effort: breathing comfortably, no respiratory distress   Auscultation/Percussion: lungs clear to auscultation, no rales, rhonchi or wheezing   Cardiac Rhythm: irregularly irregular rhythm and normal rate   Cardiac Auscultation: S1, S2 normal, no rub, no definite S3  or S4   Murmurs: grade iii/vi systolic murmur   Peripheral Circulation: normal peripheral circulation   Pedal Pulses: normal symmetric pedal pulses   Lower Extremity Edema: 1+ left lower extremity edema at the ankle, bilateral lower legs have venous discoloration  Abdominal Exam: soft, non-tender, no obvious masses, bowel sounds normal   Gait & Station: walks without assistance   Orientation: oriented to person, place and time   Affect & Mood: appropriate and sustained affect   Language and Memory: patient responsive and seems to comprehend information   Neurologic Exam: neurological assessment grossly intact   Vital signs were reviewed  Lower back incision dressing removed and replaced, moh's procedure tunneled    Cardiovascular Studies echo 09/06/2019    ? The underlying rhythm is atrial fibrillation.  ? Normal left ventricular systolic function, estimated ejection fraction is 55%.  ? Severe concentric LVH, patient has known amyloidosis.  ? Probably severe diastolic dysfunction, elevated filling pressure.  ? The right ventricle is dilated, hypertrophy of the free wall, decreased systolic function.M-Mode TAPSE 1.2 cm (normal >1.7 cm).  ? Biatrial dilatation (LA?mildly dilated, RA?moderately dilated).  ? Moderate mitral valve regurgitation, moderate to severe tricuspid valve regurgitation.  ? Severely sclerotic aortic valve, by spectral Doppler calculations mild stenosis (MG= 12 mmHg, peak velocity= 2.3 m/sec, DI= 0.27 , AVA= 1.1 cm?), trace regurgitation. By 2D echo exam only, the aortic valve systolic opening is significantly restricted, this finding is compatible with severe aortic valve stenosis. (NF-LG severe AS).  ? Estimated Peak Systolic PA Pressure 31 mmHg  Problems Addressed Today  Encounter Diagnoses   Name Primary?   ? Aortic stenosis, mild Yes       Assessment and Plan   1.  Wild-type amyloidosis: He has had a previous technetium pyrophosphate scan on 06/28/2019 that showed a 2-hour heart/contralateral lung ratio of 1.97 with visual semiquantitative grading grade 3, abnormal and highly suggestive of ATTR cardiac amyloidosis.  He has had kappa 7.45, lambda 4.07 and free light chain ratio 1.83 on 4/26.  He had serum electrophoresis that showed no paraprotein.  He  did a 6-minute walk test on 5/18, he was able to walk 289.75 m.  He has completed a KCC Q score that was 72.92 indicative of fair to good quality of life.  He has been started on tafamidis 61 mg daily.  He had a baseline CPET on 7/19.  I have asked for follow-up via telehealth in 1 week with me.   His initial blood pressure was 80/58 it was rechecked and updated at the end of  the visit, pressures have improved to 110/58, 108/56.  2.  Heart failure with preserved ejection fraction: He appears hypervolemic on physical exam on  Bumex 2 mg p.o. twice daily. Will increase bumex to 3 mg po BID and get a repeat BMP in 1 week.  He has worn compression stockings in the past but with the excessively high heat hasn't worn them.    3.  Aortic stenosis: The most recent echocardiogram on 6/8 showed severely sclerotic aortic valve by spectral Doppler calculation mild stenosis with mean gradient of 12 mmHg, the AVA is 1.1 cm.  I discussed this with Dr. [name], will continue to monitor this in the future.  4.  CKD: His creatinine on 6/13 was 3.38, it has improved to 2.10 on 6/23.   He is scheduled for follow-up with Dr. Mercie Eon on September 21.  5.  Atrial fibrillation: He continues to take amiodarone 200 mg p.o.daily, he is on oral anticoagulation with Xarelto 15 mg daily, he denies any upper or lower GI bleeding symptoms.  He has been in atrial fibrillation since at least 2020, has had previous ablations  6.  Skin cancer (basal cell) status post Mohs procedure at [facility].  His wife continues to pack the wound, I have sent a referral for wound care clinic. ________________________________________________________________________________  DISEASE PREVENTION:   Lipids/Statin treatment:  Yes.   Currently on atorvastatin 20 mg daily   for Goal LDL < 70, Triglycerides < 161 mg/dl.   HTN: Optimally controlled on current medical therapy.  Goal Systolic < 130, diastolic < 90.     Diabetes:  Denies     Tobacco: Denies        Obesity/Nutrition/Physical Activity: BMI 23.39  ____________________________________________________________________________________      I have personally documented the HPI, exam and medical decision making.  Patient education: I reviewed recent lab results and current medications, medication instructions, discussed heart failure signs & symptoms,  low  sodium diet, fluid restriction and daily weights.I have instructed the patient on the plan of care and they verbalize understanding of the plan. Please see AVS for full patient teaching. Patient advised to call our office if s/he has any problems, questions, worsening symptoms, or concerns prior to the next appointment.   Thanks for allowing me to see this nice patient. If I can be of additional assistance, please don't hesitate to contact me.     [name] AGPCNP-BC, [facility]  Pager [phone]  Collaborating physician: Dr. Vanetta Shawl      Problems Addressed Today  Encounter Diagnoses   Name Primary?   ? Heart failure with preserved ejection fraction, unspecified HF chronicity (HCC) Yes                Current Medications (including today's revisions)  ? amiodarone (CORDARONE) 200 mg tablet Take 2 tablets by mouth twice daily through 6/16.  Starting 6/17 take 2 tablets by mouth daily for 2 weeks then starting 7/1 take 1 tablet by mouth daily. Take with food.   ?  atorvastatin (LIPITOR) 20 mg tablet Take one tablet by mouth every 48 hours.   ? bumetanide (BUMEX) 1 mg tablet Take three tablets by mouth twice daily.   ? cholecalciferol (vitamin D3) (VITAMIN D3 PO) Take 1 tablet by mouth daily.   ? docusate (COLACE) 100 mg capsule Take 100 mg by mouth daily.   ? esomeprazole DR(+) (NEXIUM) 20 mg capsule Take 20 mg by mouth daily. Take on an empty stomach at least 1 hour before or 2 hours after food.   ? ferrous sulfate (FEOSOL) 325 mg (65 mg iron) tablet Take 325 mg by mouth twice daily. Take on an empty stomach at least 1 hour before or 2 hours after food.    ? fish oil /omega-3 fatty acids (SEA-OMEGA) 340/1000 mg capsule Take 1 capsule by mouth every 48 hours.   ? fluticasone/salmeterol (ADVAIR) 100/50 mcg inhalation disk Inhale 1 puff by mouth into the lungs twice daily.   ? lactobacillus rhamnosus (GG) (CULTURELLE) 10 billion cell cap Take 1 capsule by mouth daily with breakfast.   ? magnesium oxide (MAG-OX) 400 mg (241.3 mg magnesium) tablet Take one tablet by mouth daily.   ? potassium chloride SR (K-DUR) 20 mEq tablet Take two tablets by mouth daily. Take with a meal and a full glass of water   ? sildenafiL (VIAGRA) 100 mg tablet TAKE 1 TABLET BY MOUTH ONCE DAILY AS NEEDED FOR SEXUAL ACTIVITY   ? tafamidis (VYNDAMAX) 61 mg capsule Take one capsule by mouth daily.   ? VENTOLIN HFA 90 mcg/actuation inhaler Inhale 1 puff by mouth into the lungs as Needed.   ? XARELTO 15 mg tablet Take 1 tablet by mouth once daily with food

## 2019-10-25 ENCOUNTER — Encounter: Admit: 2019-10-25 | Discharge: 2019-10-25 | Payer: MEDICARE

## 2019-10-26 ENCOUNTER — Encounter: Admit: 2019-10-26 | Discharge: 2019-10-26 | Payer: MEDICARE

## 2019-10-26 DIAGNOSIS — I5030 Unspecified diastolic (congestive) heart failure: Secondary | ICD-10-CM

## 2019-10-26 LAB — BASIC METABOLIC PANEL
Lab: 138
Lab: 143 — ABNORMAL HIGH (ref 70–105)
Lab: 15 — ABNORMAL HIGH (ref 0–14)
Lab: 2 — ABNORMAL HIGH (ref 0.72–1.25)
Lab: 28
Lab: 32
Lab: 36 — ABNORMAL HIGH (ref 8.4–25.7)
Lab: 4.3
Lab: 9.6
Lab: 99

## 2019-10-28 MED ORDER — BUMETANIDE 1 MG PO TAB
ORAL_TABLET | Freq: Every morning | ORAL | 3 refills | Status: AC
Start: 2019-10-28 — End: ?

## 2019-10-28 NOTE — Progress Notes
Opened in error, no charge.

## 2019-11-02 ENCOUNTER — Encounter: Admit: 2019-11-02 | Discharge: 2019-11-02 | Payer: MEDICARE

## 2019-11-02 ENCOUNTER — Ambulatory Visit: Admit: 2019-11-02 | Discharge: 2019-11-03 | Payer: MEDICARE

## 2019-11-02 DIAGNOSIS — I5030 Unspecified diastolic (congestive) heart failure: Secondary | ICD-10-CM

## 2019-11-02 NOTE — Progress Notes
Tried to call patient to update chart multiple times, call would go to voicemail each time. Also logged into meeting and sent a message through chat to verify number and got no response. Notified [name] and Oralia Rud about appointment.

## 2019-11-02 NOTE — Patient Instructions
erroneous- patient not seen in clinic

## 2019-11-07 ENCOUNTER — Encounter: Admit: 2019-11-07 | Discharge: 2019-11-07 | Payer: MEDICARE

## 2019-11-13 ENCOUNTER — Encounter: Admit: 2019-11-13 | Discharge: 2019-11-13 | Payer: MEDICARE

## 2019-11-14 ENCOUNTER — Encounter: Admit: 2019-11-14 | Discharge: 2019-11-14 | Payer: MEDICARE

## 2019-11-14 ENCOUNTER — Ambulatory Visit: Admit: 2019-11-14 | Discharge: 2019-11-14 | Payer: MEDICARE

## 2019-11-14 DIAGNOSIS — I358 Other nonrheumatic aortic valve disorders: Secondary | ICD-10-CM

## 2019-11-14 DIAGNOSIS — C4490 Unspecified malignant neoplasm of skin, unspecified: Secondary | ICD-10-CM

## 2019-11-14 DIAGNOSIS — J302 Other seasonal allergic rhinitis: Secondary | ICD-10-CM

## 2019-11-14 DIAGNOSIS — E785 Hyperlipidemia, unspecified: Secondary | ICD-10-CM

## 2019-11-14 DIAGNOSIS — R600 Localized edema: Secondary | ICD-10-CM

## 2019-11-14 DIAGNOSIS — I10 Essential (primary) hypertension: Principal | ICD-10-CM

## 2019-11-14 DIAGNOSIS — R0602 Shortness of breath: Secondary | ICD-10-CM

## 2019-11-14 DIAGNOSIS — I5033 Acute on chronic diastolic (congestive) heart failure: Secondary | ICD-10-CM

## 2019-11-14 DIAGNOSIS — I5030 Unspecified diastolic (congestive) heart failure: Secondary | ICD-10-CM

## 2019-11-14 DIAGNOSIS — E8582 Wild-type transthyretin-related (ATTR) amyloidosis: Secondary | ICD-10-CM

## 2019-11-14 LAB — BASIC METABOLIC PANEL
Lab: 113 mg/dL — ABNORMAL HIGH (ref 70–100)
Lab: 137 MMOL/L (ref 137–147)
Lab: 2 mg/dL — ABNORMAL HIGH (ref 0.4–1.24)
Lab: 31 mL/min — ABNORMAL LOW (ref >60)
Lab: 34 MMOL/L — ABNORMAL HIGH (ref 21–30)
Lab: 37 mL/min — ABNORMAL LOW (ref >60)
Lab: 4 MMOL/L (ref 3.5–5.1)
Lab: 46 mg/dL — ABNORMAL HIGH (ref 7–25)
Lab: 6 (ref 3–12)
Lab: 9.3 mg/dL (ref 8.5–10.6)
Lab: 97 MMOL/L — ABNORMAL LOW (ref 98–110)

## 2019-11-14 MED ORDER — POTASSIUM CHLORIDE 10 MEQ PO TBTQ
10-60 meq | ORAL | 0 refills | PRN
Start: 2019-11-14 — End: ?

## 2019-11-14 MED ORDER — BUMETANIDE 0.25 MG/ML IJ SOLN
2-3 mg | INTRAVENOUS | 0 refills
Start: 2019-11-14 — End: ?

## 2019-11-14 MED ORDER — BUMETANIDE 0.25MG/ML IV DRIP (STD CONC)
2-3 mg/h | INTRAVENOUS | 0 refills
Start: 2019-11-14 — End: ?

## 2019-11-14 MED ORDER — MAGNESIUM OXIDE 400 MG (241.3 MG MAGNESIUM) PO TAB
400 mg | Freq: Once | ORAL | 0 refills
Start: 2019-11-14 — End: ?

## 2019-11-14 NOTE — Progress Notes
Date of Service: 11/14/2019    Ray [name] is a 84 y.o. male.       HPI      I had the pleasure of seeing Ray [name], who is accompanied by his wife today in [place] [facility] for amyloid follow-up.  I had last seen him in clinic on July 26, at that time his weight was 157 pounds, initial blood pressure was 80/58, on on repeat check his blood pressure was 110/58 and 108/56.  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  At the last office visit he was hypervolemic, I had increased his Bumex to 3 mg p.o. twice daily and ordered a repeat BMP, he had reported a reduction in weight, he had been scheduled for a telehealth visit however was unable to set up a telehealth visit.,  Subsequently his weight went back up and he increased his Bumex back to 3 mg p.o. twice daily.  His last labs on 7/28 had shown a stable potassium of 4.3 and creatinine 2.07.    Today he reports that his left leg has been especially reddened posteriorly.  He states it started on Saturday night and he was concerned that he might have a blood clot.  He continues to have swelling in his lower legs.  He has some positional light headedness.  He denies chest pain or palpitations, orthopnea or pnd.  He has been adherent to low sodium diet.           Vitals:    11/14/19 1053   BP: 128/60   BP Source: Arm, Left Upper   Patient Position: Sitting   Pulse: 90   Resp: 18   SpO2: 97%   Weight: 71.6 kg (157 lb 12.8 oz)   Height: 1.626 m (5' 4)   PainSc: Zero     Body mass index is 27.09 kg/m?[name]     Past Medical History  Patient Active Problem List    Diagnosis Date Noted   ? 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  ?  01/26/2019 Echo EF:?60%  IVS 1.50 cm (Range: 0.6 - 1.0)         LV PW 1.40 cm (Range: 0.6 - 1.0)         ?  1. Moderate concentric left ventricular hypertrophy  2. Unable to assess diastolic function due to atrial fibrillation  3. Moderate biatrial dilatation  4. Normal right ventricular size and systolic function  5. Normal central venous pressure  6. Mild calcific aortic valve stenosis. ?Mean gradient is 16 mmHg with a peak velocity of 2.8 m/s. ?Trace aortic valve regurgitation is seen  7. Mitral valve is structurally okay. ?There is moderate central regurgitation. ?No stenosis  8. Moderate tricuspid valve regurgitation  9. Pulmonary artery static pressure measures at 33 mmHg   06/28/2019 TC PYP Multiview planar views and SPECT imaging confirms the presence of severe marked diffuse global left ventricular and right ventricular myocardial uptake of tracer. ?There is normal rib and bone uptake.  ?  Two hour heart/contralateral lung (H/CL) ratio: ?1.977?this metric is abnormal and highly suggestive of ATTR cardiac amyloidosis.?  ?  Visual Semi-Quantitative Grading Scale Analysis:?  The study is grade 3 indicating myocardial uptake greater than rib and bone uptake. ?The pattern is highly and strongly suggestive of ATTR cardiac amyloidosis.  ?  SUMMARY/OPINION:   This study is abnormal and strongly  suggestive of ATTR cardiac amyloidosis. ?Left ventricular myocardial uptake is diffuse, intense, and global. ?There is also prominent right ventricular free wall uptake of tracer. ?The pattern is typical for ATTR cardiac amyloidosis   ? Cardiac MRI ?   07/25/2019 Kappa/Lambda FLC ?  ? Ref. Range 07/25/2019 15:48   Kappa, FLC Latest Ref Range: 0.33 - 1.94 MG/DL 1.09 (H)   Lambda, FLC Latest Ref Range: 0.57 - 2.63 MG/DL 6.04 (H)   Kappa/Lambda FLC Latest Ref Range: 0.26 - 1.65  1.83 (H)      07/25/2019 Serum Immunofixation ?NO PARAPROTEIN SEEN   cancelled Urine Immunofixation ?Patient unable to provide urine sample 4/26    07/25/2019 Genetic Testing ?Negative   ? Biopsy (Fat Pad, Cardiac, Bone Marrow) ?   ? GI Symptoms ?   ?+? 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   ?  ? Ref. Range 07/25/2019 15:48   B Type Natriuretic Peptide Latest Ref Range: 0 - 100 PG/ML 966.0 (H)   Troponin-I Latest Ref Range: 0.0 - 0.05 NG/ML 0.05   ?  Completed:  -Tafamidis start 08/2019. PA approved, pt approved for [employer] patient assistance from 09/01/2019 to 03/30/2020  -Baseline KCCQ12 completed 5/18/202, Summary Score 61.46  -Baseline 6 min walk completed 08/16/2019, distance walked 950 feet, 289.75 meters, duration of test 6 minutes  -Baseline CPET completed 10/17/2019       ? S/P ablation of atrial fibrillation 05/18/2018   ? Lightheadedness 03/16/2018   ? Hospitalization within last 30 days 12/24/2017   ? Atrial fibrillation with RVR (HCC) 12/14/2017   ? Stage 3b chronic kidney disease (HCC) 12/13/2017   ? Cardiogenic shock (HCC) 12/12/2017   ? Bradycardia 12/12/2017   ? Bilateral leg edema 12/11/2017   ? On amiodarone therapy 12/11/2017   ? Heart failure with preserved ejection fraction (HCC) 01/01/2016   ? NSVT (nonsustained ventricular tachycardia) (HCC) 06/12/2015   ? Medication side effects 11/09/2014   ? Heart palpitations 09/19/2014   ? Longstanding persistent atrial fibrillation (HCC) 03/17/2013     03/26/2018 - ECHO:  Moderate concentric LVH seen. The EF is about 55%.  The atria are slightly dilated.  The RV function is mildly decreased.  Mild to moderate MR and TR seen.  Mild AI is present.  Based on the gradients and appearance, the AS appears to be mild. The mean gradient is 16 mm Hg with a peak velocity of 2.6 m/s.  03/26/2018 - Zio Patch:  Predominant rhythm was normal sinus.  Two runs of ventricular tachycardia occurred, they were monomorphic, the longest run lasted 3 seconds, this episode occurred on April 08, 2018 at 9:41 PM.  One episode of AIVR also appeared to be present, it lasted approximately 4.6 seconds  Brief and rare episodes of SVT that probably represent atrial fibrillation, the longest lasting 6.3 seconds.  No evidence of atrial flutter and no evidence of high degree AV block.  05/12/2018 - LAAA:  Persistent Atrial Fibrillation status post successful pulmonary vein antral isolation.  Cavotricuspid Isthmus Ablation.  Ablation of Fractionated Intracardiac Electrograms.  Ablation for an Additional Discrete Arrhythmia-LA AFL after AFIB/PVI Ablation  06/23/2018 - Cardioversion:  Successful direct current cardioversion from symptomatic atrial fibrillation to sinus rhythm.  03/16/2019 - DCCV:  Successful direct current cardioversion from atrial fibrillation to sinus rhythm.     ?  Systolic murmur of aorta 02/04/2013   ? SOB (shortness of breath) 02/04/2013   ? Carotid bruit present 01/23/2012   ? Aortic stenosis, mild 01/30/2011   ? Overweight (BMI 25.0-29.9) 01/30/2011   ? Essential hypertension 01/30/2009   ? Hyperlipidemia 01/30/2009     Hyperlipidemia, on statin therapy     ? Skin cancer 01/30/2009   ? Seasonal allergic reaction 01/30/2009         Review of Systems   Constitutional: Negative.   HENT: Negative.    Eyes: Negative.    Cardiovascular: Positive for leg swelling.   Respiratory: Negative.    Endocrine: Negative.    Hematologic/Lymphatic: Negative.    Skin: Negative.    Musculoskeletal: Negative.    Gastrointestinal: Negative.    Genitourinary: Negative.    Neurological: Negative.    Psychiatric/Behavioral: Negative.    Allergic/Immunologic: Negative.        Physical Exam  General Appearance: In NAD  Neck Veins: 9 cm JVP, neck veins are distended, + HJR   Chest Inspection: chest is normal in appearance   Respiratory Effort: breathing comfortably, no respiratory distress   Auscultation/Percussion: lungs clear to auscultation, no rales, rhonchi or wheezing   Cardiac Rhythm: irregularly irregular rhythm and normal rate   Cardiac Auscultation: S1, S2 normal, no rub, no definite S3  or S4   Murmurs: grade iii/vi systolic murmur   Peripheral Circulation: normal peripheral circulation   Pedal Pulses: normal symmetric pedal pulses   Lower Extremity Edema: 1+ left lower extremity edema to the mid shins, bilateral lower legs have venous discoloration  Abdominal Exam: soft, non-tender, no obvious masses, bowel sounds normal   Gait & Station: walks without assistance   Orientation: oriented to person, place and time   Affect & Mood: appropriate and sustained affect   Language and Memory: patient responsive and seems to comprehend information   Neurologic Exam: neurological assessment grossly intact   Vital signs were reviewed  Lower back incision dressing removed and replaced, moh's procedure tunneled    Cardiovascular Studies: echo 09/06/2019  ? The underlying rhythm is atrial fibrillation.  ? Normal left ventricular systolic function, estimated ejection fraction is 55%.  ? Severe concentric LVH, patient has known amyloidosis.  ? Probably severe diastolic dysfunction, elevated filling pressure.  ? The right ventricle is dilated, hypertrophy of the free wall, decreased systolic function.M-Mode TAPSE 1.2 cm (normal >1.7 cm).  ? Biatrial dilatation (LA?mildly dilated, RA?moderately dilated).  ? Moderate mitral valve regurgitation, moderate to severe tricuspid valve regurgitation.  ? Severely sclerotic aortic valve, by spectral Doppler calculations mild stenosis (MG= 12 mmHg, peak velocity= 2.3 m/sec, DI= 0.27 , AVA= 1.1 cm?), trace regurgitation. By 2D echo exam only, the aortic valve systolic opening is significantly restricted, this finding is compatible with severe aortic valve stenosis. (NF-LG severe AS).  ? Estimated Peak Systolic PA Pressure 31 mmHg  Problems Addressed Today  Encounter Diagnoses   Name Primary?   ? Aortic stenosis, mild Yes       Assessment and Plan   1.  Wild-type amyloidosis: He has had a previous technetium pyrophosphate scan on 06/28/2019 that showed a 2-hour heart/contralateral lung ratio of 1.97 with visual semiquantitative grading grade 3, abnormal and highly suggestive of ATTR cardiac amyloidosis.  He has had kappa 7.45, lambda 4.07 and free light chain ratio 1.83 on 4/26.  He had serum electrophoresis that showed no paraprotein.  He did a 6-minute walk test on 5/18, he was able to walk 289.75 m.  He has completed a KCC Q score that was 72.92 indicative of fair to good quality of life.  He has been started on tafamidis 61 mg daily.  He had a baseline CPET on 7/19.   2.  Heart failure with preserved ejection fraction: He appears hypervolemic on physical exam on  Bumex 3 mg p.o. twice daily. Will get a BMP and BNP today and will send him to Infusion Clinic tomorrow.  I suspect his dry weight is closer to 150 pounds.  Discussed with Lorenda Ishihara RN, he did not respond at infusion last time with IV lasix, will try IV Bumex this time.  I talked to him about possible Cardiomems procedure, will need to be screened by [name] as outpatient.    3.  Aortic stenosis: The most recent echocardiogram on 6/8 showed severely sclerotic aortic valve by spectral Doppler calculation mild stenosis with mean gradient of 12 mmHg, the AVA is 1.1 cm.  I discussed this with Dr. [name], will continue to monitor this in the future.  4.  CKD: His creatinine on 6/13 was 3.38, it has improved to 2.10 on 6/23.   He is scheduled for follow-up with Dr. Mercie Eon on September 21.  5.  Atrial fibrillation: He continues to take amiodarone 200 mg p.o.daily, he is on oral anticoagulation with Xarelto 15 mg daily, he denies any upper or lower GI bleeding symptoms.  He has been in atrial fibrillation since at least 2020, has had previous ablations  6.  Left lower leg discoloration: I've ordered a venous doppler LLE this afternoon. ________________________________________________________________________________  DISEASE PREVENTION:   Lipids/Statin treatment:  Yes.   Currently on atorvastatin 20 mg daily   for Goal LDL < 70, Triglycerides < 161 mg/dl.   HTN: Optimally controlled on current medical therapy.  Goal Systolic < 130, diastolic < 90.     Diabetes:  Denies     Tobacco: Denies        Obesity/Nutrition/Physical Activity: BMI 27.09  ____________________________________________________________________________________      I have personally documented the HPI, exam and medical decision making.  Patient education: I reviewed recent lab results and current medications, medication instructions, discussed heart failure signs & symptoms,  low  sodium diet, fluid restriction and daily weights.I have instructed the patient on the plan of care and they verbalize understanding of the plan. Please see AVS for full patient teaching. Patient advised to call our office if s/he has any problems, questions, worsening symptoms, or concerns prior to the next appointment.   Thanks for allowing me to see this nice patient. If I can be of additional assistance, please don't hesitate to contact me.     [name] AGPCNP-BC, [facility]  Pager [phone]  Collaborating physician: Dr. Vanetta Shawl              No diagnosis found.                  Current Medications (including today's revisions)  ? amiodarone (CORDARONE) 200 mg tablet Take 2 tablets by mouth twice daily through 6/16.  Starting 6/17 take 2 tablets by mouth daily for 2 weeks then starting 7/1 take 1 tablet by mouth daily. Take with food.   ? atorvastatin (LIPITOR) 20 mg tablet Take one tablet by mouth every 48 hours.   ? bumetanide (BUMEX) 1 mg tablet Take three tablets by mouth every morning AND two tablets daily after lunch. (Patient taking differently: Take three tablets by mouth twice daily)   ?  cholecalciferol (vitamin D3) (VITAMIN D3 PO) Take 1 tablet by mouth daily.   ? docusate (COLACE) 100 mg capsule Take 100 mg by mouth daily.   ? esomeprazole DR(+) (NEXIUM) 20 mg capsule Take 20 mg by mouth daily. Take on an empty stomach at least 1 hour before or 2 hours after food.   ? ferrous sulfate (FEOSOL) 325 mg (65 mg iron) tablet Take 325 mg by mouth twice daily. Take on an empty stomach at least 1 hour before or 2 hours after food.    ? fish oil /omega-3 fatty acids (SEA-OMEGA) 340/1000 mg capsule Take 1 capsule by mouth every 48 hours.   ? fluticasone/salmeterol (ADVAIR) 100/50 mcg inhalation disk Inhale 1 puff by mouth into the lungs twice daily.   ? lactobacillus rhamnosus (GG) (CULTURELLE) 10 billion cell cap Take 1 capsule by mouth daily with breakfast.   ? magnesium oxide (MAG-OX) 400 mg (241.3 mg magnesium) tablet Take one tablet by mouth daily.   ? potassium chloride SR (K-DUR) 20 mEq tablet Take two tablets by mouth daily. Take with a meal and a full glass of water   ? sildenafiL (VIAGRA) 100 mg tablet TAKE 1 TABLET BY MOUTH ONCE DAILY AS NEEDED FOR SEXUAL ACTIVITY   ? tafamidis (VYNDAMAX) 61 mg capsule Take one capsule by mouth daily.   ? VENTOLIN HFA 90 mcg/actuation inhaler Inhale 1 puff by mouth into the lungs as Needed.   ? XARELTO 15 mg tablet Take 1 tablet by mouth once daily with food

## 2019-11-14 NOTE — Telephone Encounter
Called and left message on machine for patient. Informed him that doppler performed today did not find a DVT in lower left extremity. Did show a Incidental small left popliteal fossa Baker's cyst.  Informed patient to check in at 11:45 am at the [facility] admitting desk for his infusion tomorrow. Left HF triage number for call or asked patient to send [facility], with any questions or concerns.

## 2019-11-14 NOTE — Progress Notes
Medicare is listed as patient's primary insurance coverage.  Pre-certification is not required for hospitalizations.

## 2019-11-14 NOTE — Progress Notes
CardioMEMS Primary Screening                                  Date: 11/14/19  Patient Referred by: [name], APRN        Heart failure admission within last 12 months                  Date: 6/7-6/13/21  IV Bumex Yes        NYHA HF Class III (If no, please document NYHA Class) Yes        Able to take dual antiplatelet or anticoagulants for 1 month post implant Yes   Additional Screening if all above are yes        Patient with active infection No  10/24/19: Skin cancer (basal cell) status post Mohs procedure at [facility].  His wife continues to pack the wound        History of recurrent PE or DVT  (If yes, does patient have IVC filter?) No        Unable to tolerate RHC *        GFR < 25 mL/min and non-responsive to diuretics or on chronic renal dialysis No        Congenital heart disease or mechanical right heart valve No        Unrepaired severe valvular disease? Yes severe aortic valve dz        CRT device implanted within last 3 months?          Date of Implant: N/A No        BMI > 35, axillary chest circumference > 165 cm                        * cm  No        Patient unwilling to transmit device date or concerns with patient compliance No *        Please ask patient the following:        Echo in last 6 months?  Date: 09/06/19    EF:55% Yes       Patient manages own medications?  (If no, who manages pt's medications?) *       Patient/caregiver able to make medication interventions via phone?  *     CardioMEMS Pre-Implant Patient Education                                   Education Completed by: *   Video: How the CardioMEMs HF System Works *   Printmaker  *

## 2019-11-15 ENCOUNTER — Ambulatory Visit: Admit: 2019-11-15 | Discharge: 2019-11-16 | Payer: MEDICARE

## 2019-11-15 ENCOUNTER — Encounter: Admit: 2019-11-15 | Discharge: 2019-11-15 | Payer: MEDICARE

## 2019-11-15 MED ORDER — MAGNESIUM OXIDE 400 MG (241.3 MG MAGNESIUM) PO TAB
400 mg | Freq: Once | ORAL | 0 refills
Start: 2019-11-15 — End: ?

## 2019-11-15 MED ORDER — BUMETANIDE 0.25 MG/ML IJ SOLN
2-3 mg | INTRAVENOUS | 0 refills
Start: 2019-11-15 — End: ?

## 2019-11-15 MED ORDER — MAGNESIUM OXIDE 400 MG (241.3 MG MAGNESIUM) PO TAB
400 mg | Freq: Once | ORAL | 0 refills | Status: CP
Start: 2019-11-15 — End: ?

## 2019-11-15 MED ORDER — POTASSIUM CHLORIDE 10 MEQ PO TBTQ
10-60 meq | ORAL | 0 refills | PRN
Start: 2019-11-15 — End: ?

## 2019-11-15 MED ORDER — POTASSIUM CHLORIDE 10 MEQ PO TBTQ
10-60 meq | ORAL | 0 refills | Status: DC | PRN
Start: 2019-11-15 — End: 2019-11-15

## 2019-11-15 MED ORDER — BUMETANIDE 0.25MG/ML IV DRIP (STD CONC)
2-3 mg/h | INTRAVENOUS | 0 refills
Start: 2019-11-15 — End: ?

## 2019-11-15 MED ORDER — BUMETANIDE 0.25MG/ML IV DRIP (STD CONC)
2-3 mg/h | INTRAVENOUS | 0 refills | Status: DC
Start: 2019-11-15 — End: 2019-11-15

## 2019-11-15 MED ORDER — BUMETANIDE 0.25 MG/ML IJ SOLN
2-3 mg | INTRAVENOUS | 0 refills | Status: CP
Start: 2019-11-15 — End: ?

## 2019-11-16 ENCOUNTER — Encounter: Admit: 2019-11-16 | Discharge: 2019-11-16 | Payer: MEDICARE

## 2019-11-16 ENCOUNTER — Ambulatory Visit: Admit: 2019-11-16 | Discharge: 2019-11-17 | Payer: MEDICARE

## 2019-11-16 DIAGNOSIS — I5033 Acute on chronic diastolic (congestive) heart failure: Secondary | ICD-10-CM

## 2019-11-16 MED ORDER — BUMETANIDE 0.25 MG/ML IJ SOLN
2-3 mg | INTRAVENOUS | 0 refills
Start: 2019-11-16 — End: ?

## 2019-11-16 MED ORDER — MAGNESIUM OXIDE 400 MG (241.3 MG MAGNESIUM) PO TAB
400 mg | Freq: Once | ORAL | 0 refills
Start: 2019-11-16 — End: ?

## 2019-11-16 MED ORDER — POTASSIUM CHLORIDE 10 MEQ PO TBTQ
10-60 meq | ORAL | 0 refills | PRN
Start: 2019-11-16 — End: ?

## 2019-11-16 MED ORDER — POTASSIUM CHLORIDE 10 MEQ PO TBTQ
10-60 meq | ORAL | 0 refills | Status: DC | PRN
Start: 2019-11-16 — End: 2019-11-16
  Administered 2019-11-16: 18:00:00 30 meq via ORAL

## 2019-11-16 MED ORDER — BUMETANIDE 0.25 MG/ML IJ SOLN
2-3 mg | INTRAVENOUS | 0 refills | Status: CP
Start: 2019-11-16 — End: ?
  Administered 2019-11-16 (×3): 2 mg via INTRAVENOUS

## 2019-11-16 MED ORDER — BUMETANIDE 0.25MG/ML IV DRIP (STD CONC)
2-3 mg/h | INTRAVENOUS | 0 refills
Start: 2019-11-16 — End: ?

## 2019-11-16 MED ORDER — MAGNESIUM OXIDE 400 MG (241.3 MG MAGNESIUM) PO TAB
400 mg | Freq: Once | ORAL | 0 refills | Status: CP
Start: 2019-11-16 — End: ?
  Administered 2019-11-16: 18:00:00 400 mg via ORAL

## 2019-11-16 MED ORDER — BUMETANIDE 0.25MG/ML IV DRIP (STD CONC)
2-3 mg/h | INTRAVENOUS | 0 refills | Status: DC
Start: 2019-11-16 — End: 2019-11-16
  Administered 2019-11-16: 18:00:00 2 mg/h via INTRAVENOUS

## 2019-11-16 NOTE — Progress Notes
Summary for Heart Failure Infusion Clinic   Today's Date: 11/16/2019               Today?s Goal: 2 L      Today you diuresed a total of 1525 ml     Total dose of IV Push: 6mg  of Bumex   400 mg of Magnesium   30 mEq of Potassium      IV Infusion dose was: 2mg /hr x 3 hrs     Pre-infusion weight was: 153.8 lbs     Post-Infusion weight was: 150.2 lbs     Total Net Output: -       Post Infusion Vitals for today:                Blood Pressure:134/67              Heart Rate:86              Oxygen Saturation:100%       Patient Started on category: A    Patient ended on category: A

## 2019-11-17 ENCOUNTER — Encounter: Admit: 2019-11-17 | Discharge: 2019-11-17 | Payer: MEDICARE

## 2019-11-17 ENCOUNTER — Ambulatory Visit: Admit: 2019-11-17 | Discharge: 2019-11-18 | Payer: MEDICARE

## 2019-11-17 DIAGNOSIS — I350 Nonrheumatic aortic (valve) stenosis: Secondary | ICD-10-CM

## 2019-11-17 DIAGNOSIS — E785 Hyperlipidemia, unspecified: Secondary | ICD-10-CM

## 2019-11-17 DIAGNOSIS — J302 Other seasonal allergic rhinitis: Secondary | ICD-10-CM

## 2019-11-17 DIAGNOSIS — I5033 Acute on chronic diastolic (congestive) heart failure: Secondary | ICD-10-CM

## 2019-11-17 DIAGNOSIS — C4490 Unspecified malignant neoplasm of skin, unspecified: Secondary | ICD-10-CM

## 2019-11-17 DIAGNOSIS — I10 Essential (primary) hypertension: Secondary | ICD-10-CM

## 2019-11-17 DIAGNOSIS — E8582 Wild-type transthyretin-related (ATTR) amyloidosis: Secondary | ICD-10-CM

## 2019-11-17 DIAGNOSIS — I358 Other nonrheumatic aortic valve disorders: Secondary | ICD-10-CM

## 2019-11-17 MED ORDER — POTASSIUM CHLORIDE 10 MEQ PO TBTQ
10-60 meq | ORAL | 0 refills | Status: DC | PRN
Start: 2019-11-17 — End: 2019-11-17

## 2019-11-17 MED ORDER — MAGNESIUM OXIDE 400 MG (241.3 MG MAGNESIUM) PO TAB
400 mg | Freq: Once | ORAL | 0 refills | Status: DC
Start: 2019-11-17 — End: 2019-11-17

## 2019-11-17 MED ORDER — BUMETANIDE 0.25MG/ML IV DRIP (STD CONC)
2-3 mg/h | INTRAVENOUS | 0 refills | Status: CN
Start: 2019-11-17 — End: ?

## 2019-11-17 MED ORDER — BUMETANIDE 1 MG PO TAB
3 mg | ORAL_TABLET | Freq: Two times a day (BID) | ORAL | 1 refills | Status: AC
Start: 2019-11-17 — End: ?

## 2019-11-17 MED ORDER — BUMETANIDE 0.25 MG/ML IJ SOLN
2-3 mg | INTRAVENOUS | 0 refills | Status: AC
Start: 2019-11-17 — End: ?

## 2019-11-17 MED ORDER — POTASSIUM CHLORIDE 10 MEQ PO TBTQ
10-60 meq | ORAL | 0 refills | Status: CN | PRN
Start: 2019-11-17 — End: ?

## 2019-11-17 MED ORDER — BUMETANIDE 0.25 MG/ML IJ SOLN
2-3 mg | INTRAVENOUS | 0 refills | Status: CN
Start: 2019-11-17 — End: ?

## 2019-11-17 MED ORDER — MAGNESIUM OXIDE 400 MG (241.3 MG MAGNESIUM) PO TAB
400 mg | Freq: Once | ORAL | 0 refills | Status: CN
Start: 2019-11-17 — End: ?

## 2019-11-17 MED ORDER — BUMETANIDE 0.25MG/ML IV DRIP (STD CONC)
2-3 mg/h | INTRAVENOUS | 0 refills | Status: DC
Start: 2019-11-17 — End: 2019-11-17

## 2019-11-17 NOTE — Progress Notes
Patient presents to HF Infusion Clinic for his 3rd OP bumex infusion. Weight today is 151.2 lbs. Goal dry weight is 150 lbs. Creatinine improved to 1.9, potassium 4.0. VSS. [name], APR notified.     Diannia Ruder assessing patient in clinic. Verbal orders from [place]:    1. No infusion today, infusion therapy is complete.   2. Get f/u BMP drawn at [facility] in [place] in 1 week on 8/26.   3. Continue taking bumex 3 mg twice daily.   4. F/u with [name], APRN via telehealth appt on 8//31 at 11 am.     Reviewed instructions with patient and written instructions provided in AVS. Patient verbalized understanding.

## 2019-11-17 NOTE — Progress Notes
BMP Lab order faxed to [facility] in [place], [place] per patient request.

## 2019-11-17 NOTE — Progress Notes
Cardiology Heart Failure Infusion Clinic Subsequent Evaluation/Progress Note    Today's Date:  11/17/2019  Name: Ray [name]     MRN: [id number]     HPI:     Ray [name] is a 84 y.o. male. He was referred to HF Infusion Clinic by [name], APRN, on 11/14/2019 due to hypervolemia.     Ray [name] presents today to the Heart Failure IV Diuretic Infusion Clinic for third infusion of IV diuretics for symptomatic treatment of decompensated heart failure symptoms. He has had good   response to infusion therapy thus far.      Weight at initial infusion session on 8/17 157 lbs   Today's weight Wt Readings from Last 1 Encounters:   11/16/19 68.1 kg (150 lb 3.2 oz)      Initial Suspected goal dry weight 150 lbs   Adjusted goal dry weight 150 lbs       I was asked to see the patient at today's infusion session due to nearing dry weight.    Currently he reports peripheral edema and weight change. He denies shortness of breath, paroxysmal nocturnal dyspnea, orthopnea, lightheadedness and near syncope.    He reports taking medication as instructed. Current diuretic regimen includes bumex 3mg  twice a day.       Assessment:     1. Acute on chronic decompensated HFpEF. Currently has NYHA Class III, stage C symptoms with signs of hypervolemia with without signs of low flow state: he has reached his dry weight of 150 pounds; 149 pounds on his home scale today.  He will continue on bumex 3mg  po BID and have a BMP done in a week prior to his clinic appointment on 8/31.    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  Light chains on 4/26 revealed kappa 7.45, lambda 4.07 and free light chain ratio 1.83. He had serum electrophoresis that showed no paraprotein. Continue tafamidis 61mg  daily.  3. Aortic stenosis: most recent echo on 6/8 showed severely sclerotic aortic valve by spectral Doppler calculation mild stenosis with mean gradient of 12 mmHg, the AVA is 1.1 cm    4. Chronic kidney disease, stage 3  Creatinine at initial infusion 2.2. Baseline appears to be 1.8-2.0.  Creatinine today 1.9       Plans  1. No diuresis today and DC from infusion clinic.  2. See above for estimated goal weight.        Discharge Recommendations:    1. Resume home medications, including routine diuretic schedule of bumex 3mg  po BID  2. Daily home weight monitoring.  Bring weight log to all follow-up appointments.  3. 2000mg  sodium dietary restriction with 2L fluid restriction.   4. Return to heart failure clinic on 8/31     I had a discussion with Ray [name] today about the goals of HF Infusion Clinic therapy and his concerns and questionswere answered in detail to the extent that Ray [name] understands and agrees with the medical plan. Total visit of 35 minutes with Ray [name] of which 45 minutes were dedicated to advanced HF counseling and care coordination.     Objective:      Visit Diagnosis:  Encounter Diagnoses   Name Primary?   ? Acute on chronic heart failure with preserved ejection fraction (HCC) Yes  Medications    Current Outpatient Medications on File Prior to Visit   Medication Sig Dispense Refill   ? amiodarone (CORDARONE) 200 mg tablet Take 2 tablets by mouth twice daily through 6/16.  Starting 6/17 take 2 tablets by mouth daily for 2 weeks then starting 7/1 take 1 tablet by mouth daily. Take with food. 90 tablet 1   ? atorvastatin (LIPITOR) 20 mg tablet Take one tablet by mouth every 48 hours. 15 tablet 1   ? cholecalciferol (vitamin D3) (VITAMIN D3 PO) Take 1 tablet by mouth daily.     ? docusate (COLACE) 100 mg capsule Take 100 mg by mouth daily.     ? esomeprazole DR(+) (NEXIUM) 20 mg capsule Take 20 mg by mouth daily. Take on an empty stomach at least 1 hour before or 2 hours after food.     ? ferrous sulfate (FEOSOL) 325 mg (65 mg iron) tablet Take 325 mg by mouth twice daily. Take on an empty stomach at least 1 hour before or 2 hours after food.      ? fish oil /omega-3 fatty acids (SEA-OMEGA) 340/1000 mg capsule Take 1 capsule by mouth every 48 hours.     ? fluticasone/salmeterol (ADVAIR) 100/50 mcg inhalation disk Inhale 1 puff by mouth into the lungs twice daily.     ? lactobacillus rhamnosus (GG) (CULTURELLE) 10 billion cell cap Take 1 capsule by mouth daily with breakfast.     ? magnesium oxide (MAG-OX) 400 mg (241.3 mg magnesium) tablet Take one tablet by mouth daily. 180 tablet 1   ? potassium chloride SR (K-DUR) 20 mEq tablet Take two tablets by mouth daily. Take with a meal and a full glass of water 180 tablet 3   ? sildenafiL (VIAGRA) 100 mg tablet TAKE 1 TABLET BY MOUTH ONCE DAILY AS NEEDED FOR SEXUAL ACTIVITY     ? tafamidis (VYNDAMAX) 61 mg capsule Take one capsule by mouth daily. 30 capsule 11   ? VENTOLIN HFA 90 mcg/actuation inhaler Inhale 1 puff by mouth into the lungs as Needed.     ? XARELTO 15 mg tablet Take 1 tablet by mouth once daily with food 30 tablet 11     No current facility-administered medications on file prior to visit.           Vitals    Vitals:    11/17/19 1241   BP: 113/72   Pulse: 89   Temp: 36.6 ?C (97.9 ?F)   SpO2: 100%     Wt Readings from Last 10 Encounters:   11/16/19 68.1 kg (150 lb 3.2 oz)   11/15/19 69.7 kg (153 lb 9.6 oz)   11/14/19 71.6 kg (157 lb 12.8 oz)   10/24/19 71.6 kg (157 lb 14.4 oz)   09/21/19 69.8 kg (153 lb 12.8 oz)   09/11/19 72.8 kg (160 lb 9.6 oz)   09/06/19 76.9 kg (169 lb 8.5 oz)   09/05/19 80.5 kg (177 lb 6.4 oz)   09/02/19 80.6 kg (177 lb 12.8 oz)   09/01/19 81 kg (178 lb 9.6 oz)             Last Echo Result:    Echo Results  (Last 3 results in the past 3 years)    Echo EF LVIDD LA Size IVS LVPW Rest PAP    (01/26/19)   60    (09/06/19)   4.27    (09/06/19)   4.57    (09/06/19)   1.83    (09/06/19)  1.63    (09/06/19)   31       (03/26/18)   55    (01/26/19) 4.00    (01/26/19)   4.60    (01/26/19)   1.50    (01/26/19)   1.40    (01/26/19)   28       (12/16/17)   50    (03/26/18)   4.40    (03/26/18)   5.00    (03/26/18)   1.40    (03/26/18)   1.40    (03/26/18)   39               Medical History:   Diagnosis Date   ? 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 Essential hypertension 01/30/2009   ? Hyperlipidemia 01/30/2009   ? Hypertension 01/30/2009   ? Seasonal allergic reaction 01/30/2009   ? Skin cancer 01/30/2009     Surgical History:   Procedure Laterality Date   ? INTRACARDIAC CATHETER ABLATION WITH COMPREHENSIVE ELECTROPHYSIOLOGIC EVALUATION - ATRIAL FIBRILLATION N/A 05/12/2018    Performed by Jen Mow, MD at [facility] EP [facility]       Physical Exam:    General Appearance: no acute distress  Skin: warm and dry  Digits and Nails: normal color, smooth symmetric nails and digits  Eyes: conjunctivae and lids normal  Neck Veins: JVP 6+ with moderate to severe TR on echo  Chest Inspection: incision upper left chest - well healed  Auscultation/Percussion: breathing comfortably, lungs clear to auscultation, no rales or rhonchi, no wheezing  Cardiac Auscultation: Regular rhythm, S1, S2, no S3 or S4, grade2/6 systolic murmur at left midaxilla   Radial Arteries: normal symmetric radial pulses  Pedal Pulses: normal symmetric pedal pulses  Lower Extremity Edema: 1+ of the RLEt, 2+LLE edema with area of swelling in posterior left calf   Abdominal Exam: soft, non-tender, bowel sounds normal  Gait & Station: gait stable  Muscle Strength: normal strength and tone  Orientation: clear historian, good insight           Laboratory Review:     Most Recent Point of Care Results:  Sodium-POC   Date Value Ref Range Status   11/17/2019 138 137 - 147 MMOL/L Final     Potassium-POC   Date Value Ref Range Status   11/17/2019 4.0 3.5 - 5.1 MMOL/L Final     Chloride, POC   Date Value Ref Range Status   11/17/2019 95 (L) 98 - 110 MMOL/L Final     CO2, POC   Date Value Ref Range Status   11/17/2019 31 (H) 21 - 30 MMOL/L Final     Anion Gap, POC   Date Value Ref Range Status   11/17/2019 17 (H) 3 - 12 Final     Bun, POC   Date Value Ref Range Status   11/17/2019 45 (H) 7 - 25 MG/DL Final     Ionized Calcium-POC   Date Value Ref Range Status   11/17/2019 1.14 1.0 - 1.3 MMOL/L Final     Glucose, POC   Date Value Ref Range Status   11/17/2019 119 (H) 70 - 100 MG/DL Final   16/12/9602 540 (H) 70 - 100 MG/DL Final     BNP POC   Date Value Ref Range Status   11/15/2019 3,150.0 (H) 0 - 100 PG/ML Final           Leonides Sake, APRN-NP  Pager: [phone]

## 2019-11-17 NOTE — Patient Instructions
Follow up: You are scheduled for a telehealth visit with [name], APRN on 8/31 at 11 am.     Medications: Continue Bumex 3 mg twice daily.    Labs: Please get a BMP lab drawn next Thursday 8/26 or Friday 8/27 at [facility] in [place]. We have faxed over a lab order.      Additional Heart Failure discharge instructions    1.  Weigh self each morning and record.  Please bring your blood pressure and weight logs.    2.  Follow a 2 gram sodium diet restriction    3. Limit fluid intake 64 ounces or less.  Do not push fluids.     4.  Call [facility] at [phone] for any worsening symptoms of shortness of breath, lightheadedness, increasing swelling or weight gain >3 pounds overnight or 5 pounds in one week.  Please inform triage nurse that you are being treated in the Heart Failure Infusion Clinic.

## 2019-11-18 ENCOUNTER — Encounter: Admit: 2019-11-18 | Discharge: 2019-11-18 | Payer: MEDICARE

## 2019-11-18 NOTE — Telephone Encounter
Erroneous encounter. Please disregard.

## 2019-11-21 ENCOUNTER — Encounter: Admit: 2019-11-21 | Discharge: 2019-11-21 | Payer: MEDICARE

## 2019-11-21 DIAGNOSIS — I5032 Chronic diastolic (congestive) heart failure: Secondary | ICD-10-CM

## 2019-11-21 MED ORDER — ACETAMINOPHEN 325 MG PO TAB
650 mg | ORAL | 0 refills | PRN
Start: 2019-11-21 — End: ?

## 2019-11-21 MED ORDER — DOCUSATE SODIUM 100 MG PO CAP
100 mg | Freq: Every day | ORAL | 0 refills | PRN
Start: 2019-11-21 — End: ?

## 2019-11-21 MED ORDER — ALUMINUM-MAGNESIUM HYDROXIDE 200-200 MG/5 ML PO SUSP
30 mL | ORAL | 0 refills | PRN
Start: 2019-11-21 — End: ?

## 2019-11-21 MED ORDER — ASPIRIN 325 MG PO TAB
325 mg | Freq: Once | ORAL | 0 refills
Start: 2019-11-21 — End: ?

## 2019-11-22 ENCOUNTER — Encounter: Admit: 2019-11-22 | Discharge: 2019-11-22 | Payer: MEDICARE

## 2019-11-22 DIAGNOSIS — I519 Heart disease, unspecified: Secondary | ICD-10-CM

## 2019-11-22 DIAGNOSIS — I5032 Chronic diastolic (congestive) heart failure: Secondary | ICD-10-CM

## 2019-11-22 MED ORDER — RIVAROXABAN 15 MG PO TAB
15 mg | ORAL_TABLET | Freq: Every day | ORAL | 11 refills | 30.00000 days | Status: AC
Start: 2019-11-22 — End: ?

## 2019-11-22 NOTE — Telephone Encounter
-----   Message from Arneta Cliche, MD sent at 11/21/2019  4:29 PM CDT -----  Regarding: RE: Need Anticoagulation Orders Please  yes ok to hold eliquis per cath lab protocol.  ----- Message -----  From: [name], RN  Sent: 11/21/2019   3:09 PM CDT  To: Fredricka Bonine, APRN-NP, Arneta Cliche, MD, #  Subject: Need Anticoagulation Orders Please               Hi Dr. [name],    Patient is scheduled for CardioMEMS implant on 9/16. He is on Eliquis for afib.   I do not see any history in his chart of CVA, DVT, or PE. Is it ok for patient to hold his Eliquis the day before and the morning of his CardioMEMS procedure?

## 2019-11-24 ENCOUNTER — Encounter: Admit: 2019-11-24 | Discharge: 2019-11-24 | Payer: MEDICARE

## 2019-11-24 DIAGNOSIS — I5032 Chronic diastolic (congestive) heart failure: Secondary | ICD-10-CM

## 2019-11-25 ENCOUNTER — Encounter: Admit: 2019-11-25 | Discharge: 2019-11-25 | Payer: MEDICARE

## 2019-11-25 DIAGNOSIS — I5033 Acute on chronic diastolic (congestive) heart failure: Secondary | ICD-10-CM

## 2019-11-25 MED ORDER — BUMETANIDE 1 MG PO TAB
ORAL_TABLET | 1 refills | Status: AC
Start: 2019-11-25 — End: ?

## 2019-11-25 NOTE — Progress Notes
Medicare Primary No pre-certification is required.

## 2019-11-25 NOTE — Telephone Encounter
Fredricka Bonine, APRN-NP  P Cvm Nurse Hf Team [place]  Slight increase in creat to 2.12. ?He's currently on bumex 3 mg BID. ?If he is able to, please alternate bumex 3 mg am and pm every other day with bumex 3 mg am and 2 mg pm. ?He gains when he is on 3 mg/2mg , and is dry in 3 bid. ?Cardiomems should help. ?BMP in 1 week. ?He's scheduled to see Diannia Ruder on 8/31. ?Please reschedule the [facility] appointment to my schedule on 9/10 since I'm opening up the schedule for that day (amyloid).THanks. DM

## 2019-11-29 ENCOUNTER — Encounter: Admit: 2019-11-29 | Discharge: 2019-11-29 | Payer: MEDICARE

## 2019-11-29 ENCOUNTER — Ambulatory Visit: Admit: 2019-11-29 | Discharge: 2019-11-30 | Payer: MEDICARE

## 2019-11-29 DIAGNOSIS — I358 Other nonrheumatic aortic valve disorders: Secondary | ICD-10-CM

## 2019-11-29 DIAGNOSIS — I428 Other cardiomyopathies: Secondary | ICD-10-CM

## 2019-11-29 DIAGNOSIS — I10 Essential (primary) hypertension: Principal | ICD-10-CM

## 2019-11-29 DIAGNOSIS — C4490 Unspecified malignant neoplasm of skin, unspecified: Secondary | ICD-10-CM

## 2019-11-29 DIAGNOSIS — I5032 Chronic diastolic (congestive) heart failure: Secondary | ICD-10-CM

## 2019-11-29 DIAGNOSIS — N184 Chronic kidney disease, stage 4 (severe): Secondary | ICD-10-CM

## 2019-11-29 DIAGNOSIS — J302 Other seasonal allergic rhinitis: Secondary | ICD-10-CM

## 2019-11-29 DIAGNOSIS — E8582 Wild-type transthyretin-related (ATTR) amyloidosis: Secondary | ICD-10-CM

## 2019-11-29 DIAGNOSIS — E785 Hyperlipidemia, unspecified: Secondary | ICD-10-CM

## 2019-11-29 NOTE — Progress Notes
Date of Service: 11/29/2019    Obtained patient's verbal consent to treat them and their agreement to [facility] financial policy and NPP via this telehealth visit during the [facility].    This video home visit was conducted via communication between the patient and physician/provider due to COVID-19.  We have found that certain health care needs can be provided without an in-person visit.. This service lets [place] provide the care patients' need.  If a prescription is necessary we can send it directly to their pharmacy.  If testing is necessary this can be arranged.    Ray [name] is a 84 y.o. male.     HPI     Ray [name] was seen today by telehealth from the heart failure clinic for a 2-week follow-up.  He was last seen by [name], APRN, on 11/14/2019.  At that appointment, his weight was noted to be 157 pounds.  He was referred to the infusion clinic and underwent 2 infusions of IV Bumex.  His weight at the day of discharge from the infusion clinic was 151.2 pounds.  He is scheduled to undergo a CardioMEMS implant on 12/15/2019 for better fluid management.    His past medical history includes wild-type amyloidosis, aortic stenosis, CKD, atrial fibrillation, and basal skin cell cancer.  His last echocardiogram from 09/06/2019 revealed an LVEF of 55% with severe concentric LVH, probable severe diastolic dysfunction, dilated RV with hypertrophy of the free wall with decreased systolic function, biatrial dilatation, moderate MR, moderate to severe TR, severely sclerotic aortic valve with severe aortic valve stenosis.    Today, he reports that he has been doing pretty well.  He did gain a couple pounds after having a birthday celebration with his family.  His weights have been between 100 4751 pounds since his discharge from the infusion clinic.  He reports his swelling is remarkable.  He continues to have some chronic left foot edema.  His lower extremity edema is stable throughout the day.  He does note that he has not had much dyspnea as well.  He does remark that he has been losing his hair, and he was wondering about possible medication side effects causing that.         Telehealth Patient Reported Vitals     Row Name 11/29/19 1046 11/29/19 1044             BP:  114/80  ?       BP Source:  Arm, Left Upper  ?       BP Position:  Sitting  ?       BP Method:  Automatic/Digital Reading  ?       Pulse:  85  ?       Weight:  68.5 kg (151 lb)  ?       Height:  1.626 m (5' 4)  ?       SpO2:  ? unable to get  ?       Pain Score:  Zero  ?       Pain Score  ?  Zero       Patient Position  ?  Sitting       BP Source  ?  Arm, Left Upper                Body mass index is 25.92 kg/m?[name]     Past Medical History  Patient Active Problem List    Diagnosis Date Noted   ?  Acute on chronic heart failure with preserved ejection fraction (HCC) 11/14/2019   ? 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  ?  01/26/2019 Echo EF:?60%  IVS 1.50 cm (Range: 0.6 - 1.0)         LV PW 1.40 cm (Range: 0.6 - 1.0)         ?  1. Moderate concentric left ventricular hypertrophy  2. Unable to assess diastolic function due to atrial fibrillation  3. Moderate biatrial dilatation  4. Normal right ventricular size and systolic function  5. Normal central venous pressure  6. Mild calcific aortic valve stenosis. ?Mean gradient is 16 mmHg with a peak velocity of 2.8 m/s. ?Trace aortic valve regurgitation is seen  7. Mitral valve is structurally okay. ?There is moderate central regurgitation. ?No stenosis  8. Moderate tricuspid valve regurgitation  9. Pulmonary artery static pressure measures at 33 mmHg   06/28/2019 TC PYP Multiview planar views and SPECT imaging confirms the presence of severe marked diffuse global left ventricular and right ventricular myocardial uptake of tracer. ?There is normal rib and bone uptake.  ?  Two hour heart/contralateral lung (H/CL) ratio: ?1.977?this metric is abnormal and highly suggestive of ATTR cardiac amyloidosis.?  ?  Visual Semi-Quantitative Grading Scale Analysis:?  The study is grade 3 indicating myocardial uptake greater than rib and bone uptake. ?The pattern is highly and strongly suggestive of ATTR cardiac amyloidosis.  ?  SUMMARY/OPINION:   This study is abnormal and strongly suggestive of ATTR cardiac amyloidosis. ?Left ventricular myocardial uptake is diffuse, intense, and global. ?There is also prominent right ventricular free wall uptake of tracer. ?The pattern is typical for ATTR cardiac amyloidosis   ? Cardiac MRI ?   07/25/2019 Kappa/Lambda FLC ?  ? Ref. Range 07/25/2019 15:48   Kappa, FLC Latest Ref Range: 0.33 - 1.94 MG/DL 1.61 (H)   Lambda, FLC Latest Ref Range: 0.57 - 2.63 MG/DL 0.96 (H)   Kappa/Lambda FLC Latest Ref Range: 0.26 - 1.65  1.83 (H)      07/25/2019 Serum Immunofixation ?NO PARAPROTEIN SEEN   cancelled Urine Immunofixation ?Patient unable to provide urine sample 4/26    07/25/2019 Genetic Testing ?Negative   ? Biopsy (Fat Pad, Cardiac, Bone Marrow) ?   ? GI Symptoms ?   ?+? 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   ?  ? Ref. Range 07/25/2019 15:48   B Type Natriuretic Peptide Latest Ref Range: 0 - 100 PG/ML 966.0 (H)   Troponin-I Latest Ref Range: 0.0 - 0.05 NG/ML 0.05   ?  Completed:  -Tafamidis start 08/2019. PA approved, pt approved for [employer] patient assistance from 09/01/2019 to 03/30/2020  -Baseline KCCQ12 completed 5/18/202, Summary Score 61.46  -Baseline 6 min walk completed 08/16/2019, distance walked 950 feet, 289.75 meters, duration of test 6 minutes  -Baseline CPET completed 10/17/2019       ? S/P ablation of atrial fibrillation 05/18/2018   ? Lightheadedness 03/16/2018   ? Hospitalization within last 30 days 12/24/2017   ? Atrial fibrillation with RVR (HCC) 12/14/2017   ? Stage 3b chronic kidney disease (HCC) 12/13/2017   ? Cardiogenic shock (HCC) 12/12/2017   ? Bradycardia 12/12/2017   ? Bilateral leg edema 12/11/2017   ? On amiodarone therapy 12/11/2017   ? Heart  failure with preserved ejection fraction (HCC) 01/01/2016   ? NSVT (nonsustained ventricular tachycardia) (HCC) 06/12/2015   ? Medication side effects 11/09/2014   ? Heart palpitations 09/19/2014   ? Longstanding persistent atrial fibrillation (HCC) 03/17/2013     03/26/2018 - ECHO:  Moderate concentric LVH seen. The EF is about 55%.  The atria are slightly dilated.  The RV function is mildly decreased.  Mild to moderate MR and TR seen.  Mild AI is present.  Based on the gradients and appearance, the AS appears to be mild. The mean gradient is 16 mm Hg with a peak velocity of 2.6 m/s.  03/26/2018 - Zio Patch:  Predominant rhythm was normal sinus.  Two runs of ventricular tachycardia occurred, they were monomorphic, the longest run lasted 3 seconds, this episode occurred on April 08, 2018 at 9:41 PM.  One episode of AIVR also appeared to be present, it lasted approximately 4.6 seconds  Brief and rare episodes of SVT that probably represent atrial fibrillation, the longest lasting 6.3 seconds.  No evidence of atrial flutter and no evidence of high degree AV block.  05/12/2018 - LAAA:  Persistent Atrial Fibrillation status post successful pulmonary vein antral isolation.  Cavotricuspid Isthmus Ablation.  Ablation of Fractionated Intracardiac Electrograms.  Ablation for an Additional Discrete Arrhythmia-LA AFL after AFIB/PVI Ablation  06/23/2018 - Cardioversion:  Successful direct current cardioversion from symptomatic atrial fibrillation to sinus rhythm.  03/16/2019 - DCCV:  Successful direct current cardioversion from atrial fibrillation to sinus rhythm.     ? Systolic murmur of aorta 02/04/2013   ? SOB (shortness of breath) 02/04/2013   ? Carotid bruit present 01/23/2012   ? Aortic stenosis, mild 01/30/2011   ? Overweight (BMI 25.0-29.9) 01/30/2011   ? Essential hypertension 01/30/2009   ? Hyperlipidemia 01/30/2009     Hyperlipidemia, on statin therapy     ? Skin cancer 01/30/2009   ? Seasonal allergic reaction 01/30/2009         Review of Systems   Constitutional: Negative.   HENT: Negative.    Eyes: Negative.    Cardiovascular: Negative.    Respiratory: Negative.    Endocrine: Negative.    Hematologic/Lymphatic: Negative.    Skin: Negative.    Musculoskeletal: Negative.    Gastrointestinal: Negative.    Genitourinary: Negative.    Neurological: Negative.    Psychiatric/Behavioral: Negative.    Allergic/Immunologic: Negative.        Physical exam limited d/t video tele health visit:  General Appearance: patient in no apparent distress.  Skin: pink, no jaundice, exposed skin is intact  Eyes: conjunctivae and lids normal, pupils are equal and round   Lips: no pallor or cyanosis   Neck Veins: VESSELS: JVP is not elevated above the sternal notch    Respiratory Effort: breathing comfortably, no respiratory distress   Lower extremities: no significant LE edema with chronic changes noted  Orientation: oriented to time, place and person   PSYCH: Appropriate   Language and Memory: patient responsive and seems to comprehend information   NEURO: Alert and conversant        Problems Addressed Today  Encounter Diagnoses   Name Primary?   ? Chronic heart failure with preserved ejection fraction (HCC)  -Describes euvolemia today  -Reports weights have been between 149-151 pounds  - does acknowledge that he overindulged for his birthday and gained a couple pounds  - no changes in medications  - follow-up 9/15 with [facility] for preprocedure H&P/labs Yes   ? Wild-type  transthyretin-related (ATTR) amyloidosis (HCC)  - Currently on tafamidis 61 mg daily  - previous PYP scan on 06/28/19 revealed a 2-hour heart/contralateral lung ratio of 1.97 with visual semiquantitative grading grade 3, abnormal and highly suggestive of ATTR cardiac amyloidosis.  - Light chains on 4/26 revealed kappa 7.45, lambda 4.07 and free light chain ratio 1.83. He had serum electrophoresis that showed no paraprotein.    ? Nonischemic cardiomyopathy (HCC)  - describes euvolemia on current dose of bumex  - cardiomems to be placed on 9/16    ? CKD (chronic kidney disease) stage 4, GFR 15-29 ml/min (HCC)  - Baseline appears to be 1.8-2.0  - creatinine 8/25 2.15               Plan:  The following is a copy of the written instructions and plan I gave to the patient during his office visit.    Patient Instructions       Thank you for coming to [facility]. Your instructions today:     1.  Recommendations: no changes in medications.  It was good to see you.  Have a great birthday!  2.  Next follow up appointment: 9/15 with [facility] for preprocedure H&P and labs for cardiomems    For up to date information on the COVID-19 virus, visit the [facility] website.  ? General supportive care during cold and flu season and infection prevention reminders:   o Wash hands often with soap and water for at least 20 seconds  o Cover your mouth and nose  o Stay home if sick and symptoms mild or manageable  - If you must be around people wear a mask    ? If you are having symptoms of a lower respiratory infection (cough, shortness of breath) and/or fever AND either traveled in last 30 days (internationally or to region of exposure) OR known exposure to patient with COVID19:    o Call your primary care provider for questions or health needs.   - Tell your doctor about your recent travel and your symptoms    o In a medical emergency, call 911 or go to the nearest emergency room.      Renita Papa, MD, MSc  [name], APRN  Elayne Snare, RN  [facility]  Phone: [phone]   Fax: [phone]  Scheduling: [phone]    In order to provide you the best care possible we ask that you follow up as below:     For NON-URGENT questions please contact us through your MyChart account.   For all medication refills please contact your pharmacy or send a request through MyChart.   For all questions that may need to be addressed urgently please call the nursing triage line at [phone] Monday - Friday 8 AM-5 PM only. Please leave a detailed message with your name, date of birth, and reason for your call.       To schedule an appointment call [phone].      Please allow 10 business days for the results of any testing to be reviewed. Please call our office if you have not heard from a nurse within this time frame.      Your Heart Failure Symptom Awareness and Action Plan  Every Day Action Plan  ? Weigh yourself in the morning before breakfast. Write it down and compare it to yesterday's weight.  ? Take your medicine, as prescribed. Please call if  you have concerns about the side effects, cost or refills.  ? Check for worsened swelling in your feet, ankles and stomach  ? Follow a 2000mg  salt diet.  ? Keep all healthcare appointments    Green Zone   Good! Symptoms are under control ? No shortness of breath  ? No increase in ankle swelling  ? No weight gain  ? No chest pain  ? No change in your usual activity  ? Continue to follow your everyday action plan.    Yellow Zone  If you have any of these symptoms, please call the heart failure nurses: [phone] ? Increased shortness of breath with activity  ? Weight gain of 3 pounds in one day or 5 pounds in a week  ? Increased swelling in your ankles or legs  ? Increased swelling in your stomach  ? Increasing fatigue  ? You may need an adjustment of your medications.    Red Zone  These are urgent symptoms. Please call the heart failure nurses: [phone] ? Shortness of breath at rest or waking up at night feeling short of breath or coughing  ? Increased number of pillows used or needing to sit upright to sleep  ? Chest tightness at rest  ? Dizziness, lightheadedness or feeling faint You need to schedule an appointment   Emergency Zone ? call 911 ? Worsening chest tightness or pain that is not relieved by medication  ? Severe shortness of breath and a cough with pink, frothy sputum           Future Appointments   Date Time Provider Department Center   12/14/2019 12:45 PM [facility] None   12/14/2019  1:00 PM Eliberto Ivory, APRN-NP [facility] CVM Exam   12/20/2019  2:00 PM [name], MD [facility] IM   06/19/2020  1:00 PM [name], MD [facility] IM           Education/counseling time spent: 20 minutes of 30 minute visigt  Topics:  HF disease process, Treatment options, Treatment risks and benefits, Medication instructions, Medication interactions, Daily weight monitoring, Sodium and fluid restriction, Personalized exercise guidelines, Knows HF symptoms, Knows when to call, Knows who to call, Knows next appt.           Current Medications (including today's revisions)  ? amiodarone (CORDARONE) 200 mg tablet Take 2 tablets by mouth twice daily through 6/16.  Starting 6/17 take 2 tablets by mouth daily for 2 weeks then starting 7/1 take 1 tablet by mouth daily. Take with food.   ? atorvastatin (LIPITOR) 20 mg tablet Take one tablet by mouth every 48 hours.   ? bumetanide (BUMEX) 1 mg tablet Take 3mg  every morning. In the afternoon, alternate 2mg  and 3mg  every other day.   ? cholecalciferol (vitamin D3) (VITAMIN D3 PO) Take 1 tablet by mouth daily.   ? docusate (COLACE) 100 mg capsule Take 100 mg by mouth daily.   ? esomeprazole DR(+) (NEXIUM) 20 mg capsule Take 20 mg by mouth daily. Take on an empty stomach at least 1 hour before or 2 hours after food.   ? ferrous sulfate (FEOSOL) 325 mg (65 mg iron) tablet Take 325 mg by mouth twice daily. Take on an empty stomach at least 1 hour before or 2 hours after food.    ? fish oil /omega-3 fatty acids (SEA-OMEGA) 340/1000 mg capsule Take 1 capsule by mouth every 48 hours.   ? fluticasone/salmeterol (ADVAIR) 100/50 mcg inhalation disk Inhale 1 puff  by mouth into the lungs twice daily.   ? lactobacillus rhamnosus (GG) (CULTURELLE) 10 billion cell cap Take 1 capsule by mouth daily with breakfast.   ? magnesium oxide (MAG-OX) 400 mg (241.3 mg magnesium) tablet Take one tablet by mouth daily.   ? potassium chloride SR (K-DUR) 20 mEq tablet Take two tablets by mouth daily. Take with a meal and a full glass of water   ? rivaroxaban (XARELTO) 15 mg tablet Take one tablet by mouth daily with breakfast. Take with food. Hold the day before and the morning of your CardioMEMS procedure.   ? sildenafiL (VIAGRA) 100 mg tablet TAKE 1 TABLET BY MOUTH ONCE DAILY AS NEEDED FOR SEXUAL ACTIVITY   ? tafamidis (VYNDAMAX) 61 mg capsule Take one capsule by mouth daily.   ? VENTOLIN HFA 90 mcg/actuation inhaler Inhale 1 puff by mouth into the lungs as Needed.

## 2019-11-30 ENCOUNTER — Encounter: Admit: 2019-11-30 | Discharge: 2019-11-30 | Payer: MEDICARE

## 2019-11-30 NOTE — Telephone Encounter
Reviewed with KB, she would recommend rosuvastatin 5 mg three times a week if patient should be agreeable.     Called and LMOM with rec to switch from atorvastatin to rosuvastatin. Asked patient to call back or send [facility] if he was interested in switch.

## 2019-11-30 NOTE — Telephone Encounter
-----   Message from [place], [place] sent at 11/29/2019  2:34 PM CDT -----  The only thing on his medication list that has a potential for hair loss would be atorvastatin - we could switch to rosuvastatin and see if anything improves?    Thanks!alissa  ----- Message -----  From: Leonides Sake, APRN-NP  Sent: 11/29/2019  11:18 AM CDT  To: Cvm Nurse Hf Team [place], [name]    Ray [name] is an amyloid patient on tafadamis.  He is asking about medications causing hair loss as he has started losing his hair.  Not sure if you can review his meds and see if something specific could be causing hair loss or not.    Thanks.    Diannia Ruder

## 2019-12-02 NOTE — Telephone Encounter
See My Chart encounter 09/03. Rosuvastatin medication refill sent to patient's pharmacy, after pt confirmation.

## 2019-12-07 ENCOUNTER — Encounter: Admit: 2019-12-07 | Discharge: 2019-12-07 | Payer: MEDICARE

## 2019-12-07 NOTE — Telephone Encounter
Spoke with patient and wife to inquire if pre-procedure labs had been drawn. Patient states he has not had them drawn, but will go tomorrow. Labs faxed to [facility] lab. Also instructed patient to get COVID swab done the morning of 9/13 at [facility], order for that previously faxed to them.

## 2019-12-08 LAB — BASIC METABOLIC PANEL
Lab: 127 — ABNORMAL HIGH (ref 70–105)
Lab: 139
Lab: 16 — ABNORMAL HIGH (ref 0–14)
Lab: 2.1 — ABNORMAL HIGH (ref 0.72–1.25)
Lab: 28
Lab: 3.7
Lab: 32
Lab: 49 — ABNORMAL HIGH (ref 8.4–25.7)
Lab: 9.5
Lab: 99

## 2019-12-08 LAB — BNP (B-TYPE NATRIURETIC PEPTI): Lab: 185 — ABNORMAL HIGH (ref 0–100)

## 2019-12-08 LAB — CBC
Lab: 13 — ABNORMAL LOW (ref 14.0–18.0)
Lab: 15 — ABNORMAL HIGH (ref 11.5–14.5)
Lab: 168
Lab: 31
Lab: 31 — ABNORMAL HIGH (ref 27.0–31.0)
Lab: 4.2 — ABNORMAL LOW (ref 4.70–6.10)
Lab: 41 — ABNORMAL LOW (ref 42.0–52.0)
Lab: 8.5
Lab: 98

## 2019-12-09 ENCOUNTER — Encounter: Admit: 2019-12-09 | Discharge: 2019-12-09 | Payer: MEDICARE

## 2019-12-09 DIAGNOSIS — I5033 Acute on chronic diastolic (congestive) heart failure: Secondary | ICD-10-CM

## 2019-12-09 DIAGNOSIS — I5032 Chronic diastolic (congestive) heart failure: Secondary | ICD-10-CM

## 2019-12-09 DIAGNOSIS — I519 Heart disease, unspecified: Secondary | ICD-10-CM

## 2019-12-13 ENCOUNTER — Encounter: Admit: 2019-12-13 | Discharge: 2019-12-13 | Payer: MEDICARE

## 2019-12-13 NOTE — Telephone Encounter
Patient's wife called to request fax number to fax COVID results to. Patient had COVID Swab at [facility] on 9/13. Provided HF Clinic fax [phone]. Will watch for pre-procedure COVID swab results.

## 2019-12-14 ENCOUNTER — Encounter: Admit: 2019-12-14 | Discharge: 2019-12-14 | Payer: MEDICARE

## 2019-12-14 ENCOUNTER — Ambulatory Visit: Admit: 2019-12-14 | Discharge: 2019-12-14 | Payer: MEDICARE

## 2019-12-14 DIAGNOSIS — I5032 Chronic diastolic (congestive) heart failure: Secondary | ICD-10-CM

## 2019-12-14 DIAGNOSIS — E785 Hyperlipidemia, unspecified: Secondary | ICD-10-CM

## 2019-12-14 DIAGNOSIS — I350 Nonrheumatic aortic (valve) stenosis: Secondary | ICD-10-CM

## 2019-12-14 DIAGNOSIS — J302 Other seasonal allergic rhinitis: Secondary | ICD-10-CM

## 2019-12-14 DIAGNOSIS — I358 Other nonrheumatic aortic valve disorders: Secondary | ICD-10-CM

## 2019-12-14 DIAGNOSIS — E8582 Wild-type transthyretin-related (ATTR) amyloidosis: Secondary | ICD-10-CM

## 2019-12-14 DIAGNOSIS — I10 Essential (primary) hypertension: Secondary | ICD-10-CM

## 2019-12-14 DIAGNOSIS — Z20822 Encounter for screening laboratory testing for COVID-19 virus in asymptomatic patient: Secondary | ICD-10-CM

## 2019-12-14 DIAGNOSIS — I428 Other cardiomyopathies: Secondary | ICD-10-CM

## 2019-12-14 DIAGNOSIS — I4811 Longstanding persistent atrial fibrillation: Secondary | ICD-10-CM

## 2019-12-14 DIAGNOSIS — C4490 Unspecified malignant neoplasm of skin, unspecified: Secondary | ICD-10-CM

## 2019-12-14 LAB — COVID-19 (SARS-COV-2) PCR

## 2019-12-14 MED ORDER — BUMETANIDE 1 MG PO TAB
3 mg | ORAL_TABLET | Freq: Two times a day (BID) | ORAL | 3 refills | Status: AC
Start: 2019-12-14 — End: ?

## 2019-12-14 NOTE — Progress Notes
Date of Service: 12/14/2019    Ray [name] is a 84 y.o. male.   He is followed by Ray [name] and Ray [name].    HPI     His past medical history is significant for heart failure with preserved ejection fraction, nonischemic cardiomyopathy,  wild-type ATTR amyloidosis, persistent atrial fibrillation status post ablation,  hypertension, hyperlipidemia, aortic stenosis, NSVT, chronic kidney disease, and basal skin cell carcinoma.    He was last seen by Ray [name] on 11/29/2019 via telehealth.  At this visit he was hypervolemic.  We will continue with his diuretics as scheduled.  No additional changes.    He is scheduled for a right heart catheterization with CardioMEMS implant on 12/15/2019.    He is here today for his preprocedure history and physical.  He reports he is doing better.  He is now taking Bumex 3 mg twice daily and believes that is helping with his edema.  His weight is down to 153 pounds. He denies orthopnea, paroxysmal nocturnal dyspnea, early satiety, chest pain, irregular heartbeat/palpitations, dizziness, and/or syncope/near syncope.?       Vitals:    12/14/19 1219   BP: 136/76   BP Source: Arm, Right Upper   Patient Position: Sitting   Pulse: 59   Temp: 36.1 ?C (97 ?F)   TempSrc: Skin   SpO2: 98%   Weight: 69.4 kg (153 lb)   Height: 1.626 m (5' 4)   PainSc: Zero     Body mass index is 26.26 kg/m?[name]     Past Medical History  Patient Active Problem List    Diagnosis Date Noted   ? Acute on chronic heart failure with preserved ejection fraction (HCC) 11/14/2019   ? 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  ?  01/26/2019 Echo EF:?60%  IVS 1.50 cm (Range: 0.6 - 1.0)         LV PW 1.40 cm (Range: 0.6 - 1.0)         ?  1. Moderate concentric left ventricular hypertrophy  2. Unable to assess diastolic function due to atrial fibrillation  3. Moderate biatrial dilatation  4. Normal right ventricular size and systolic function  5. Normal central venous pressure  6. Mild calcific aortic valve stenosis. ?Mean gradient is 16 mmHg with a peak velocity of 2.8 m/s. ?Trace aortic valve regurgitation is seen  7. Mitral valve is structurally okay. ?There is moderate central regurgitation. ?No stenosis  8. Moderate tricuspid valve regurgitation  9. Pulmonary artery static pressure measures at 33 mmHg   06/28/2019 TC PYP Multiview planar views and SPECT imaging confirms the presence of severe marked diffuse global left ventricular and right ventricular myocardial uptake of tracer. ?There is normal rib and bone uptake.  ?  Two hour heart/contralateral lung (H/CL) ratio: ?1.977?this metric is abnormal and highly suggestive of ATTR cardiac amyloidosis.?  ?  Visual Semi-Quantitative Grading Scale Analysis:?  The study is grade 3 indicating myocardial uptake greater than rib and bone uptake. ?The pattern is highly and strongly suggestive of ATTR cardiac amyloidosis.  ?  SUMMARY/OPINION:   This study is abnormal and strongly suggestive of ATTR cardiac amyloidosis. ?Left ventricular myocardial uptake is diffuse, intense, and global. ?There is also prominent right ventricular free wall uptake of tracer. ?The pattern is typical for ATTR cardiac amyloidosis   ? Cardiac MRI ?   07/25/2019 Kappa/Lambda FLC ?  ? Ref. Range 07/25/2019 15:48   Kappa, FLC Latest Ref Range:  0.33 - 1.94 MG/DL 4.78 (H)   Lambda, FLC Latest Ref Range: 0.57 - 2.63 MG/DL 2.95 (H)   Kappa/Lambda FLC Latest Ref Range: 0.26 - 1.65  1.83 (H)      07/25/2019 Serum Immunofixation ?NO PARAPROTEIN SEEN   cancelled Urine Immunofixation ?Patient unable to provide urine sample 4/26    07/25/2019 Genetic Testing ?Negative   ? Biopsy (Fat Pad, Cardiac, Bone Marrow) ?   ? GI Symptoms ?   ?+? 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   ?  ? Ref. Range 07/25/2019 15:48   B Type Natriuretic Peptide Latest Ref Range: 0 - 100 PG/ML 966.0 (H)   Troponin-I Latest Ref Range: 0.0 - 0.05 NG/ML 0.05   ?  Completed:  -Tafamidis start 08/2019. PA approved, pt approved for [facility] patient assistance from 09/01/2019 to 03/30/2020  -Baseline KCCQ12 completed 5/18/202, Summary Score 61.46  -Baseline 6 min walk completed 08/16/2019, distance walked 950 feet, 289.75 meters, duration of test 6 minutes  -Baseline CPET completed 10/17/2019       ? S/P ablation of atrial fibrillation 05/18/2018   ? Lightheadedness 03/16/2018   ? Hospitalization within last 30 days 12/24/2017   ? Atrial fibrillation with RVR (HCC) 12/14/2017   ? Stage 3b chronic kidney disease (HCC) 12/13/2017   ? Cardiogenic shock (HCC) 12/12/2017   ? Bradycardia 12/12/2017   ? Bilateral leg edema 12/11/2017   ? On amiodarone therapy 12/11/2017   ? Heart failure with preserved ejection fraction (HCC) 01/01/2016   ? NSVT (nonsustained ventricular tachycardia) (HCC) 06/12/2015   ? Medication side effects 11/09/2014   ? Heart palpitations 09/19/2014   ? Longstanding persistent atrial fibrillation (HCC) 03/17/2013     03/26/2018 - ECHO:  Moderate concentric LVH seen. The EF is about 55%.  The atria are slightly dilated.  The RV function is mildly decreased.  Mild to moderate MR and TR seen.  Mild AI is present.  Based on the gradients and appearance, the AS appears to be mild. The mean gradient is 16 mm Hg with a peak velocity of 2.6 m/s.  03/26/2018 - Zio Patch:  Predominant rhythm was normal sinus.  Two runs of ventricular tachycardia occurred, they were monomorphic, the longest run lasted 3 seconds, this episode occurred on April 08, 2018 at 9:41 PM.  One episode of AIVR also appeared to be present, it lasted approximately 4.6 seconds  Brief and rare episodes of SVT that probably represent atrial fibrillation, the longest lasting 6.3 seconds.  No evidence of atrial flutter and no evidence of high degree AV block.  05/12/2018 - LAAA:  Persistent Atrial Fibrillation status post successful pulmonary vein antral isolation.  Cavotricuspid Isthmus Ablation.  Ablation of Fractionated Intracardiac Electrograms.  Ablation for an Additional Discrete Arrhythmia-LA AFL after AFIB/PVI Ablation  06/23/2018 - Cardioversion:  Successful direct current cardioversion from symptomatic atrial fibrillation to sinus rhythm.  03/16/2019 - DCCV:  Successful direct current cardioversion from atrial fibrillation to sinus rhythm.     ? Systolic murmur of aorta 02/04/2013   ? SOB (shortness of breath) 02/04/2013   ? Carotid bruit present 01/23/2012   ? Aortic stenosis, mild 01/30/2011   ? Overweight (BMI 25.0-29.9) 01/30/2011   ? Essential hypertension 01/30/2009   ? Hyperlipidemia 01/30/2009     Hyperlipidemia, on statin therapy     ?  Skin cancer 01/30/2009   ? Seasonal allergic reaction 01/30/2009     Allergies   Allergen Reactions   ? Beta Blockers [Beta-Blockers (Beta-Adrenergic Blocking Agts)] SEE COMMENTS     Pt had beta blocker toxicity from one dose of carvedilol     Social History     Socioeconomic History   ? Marital status: Married     Spouse name: Not on file   ? Number of children: Not on file   ? Years of education: Not on file   ? Highest education level: Not on file   Occupational History   ? Not on file   Tobacco Use   ? Smoking status: Never Smoker   ? Smokeless tobacco: Never Used   Vaping Use   ? Vaping Use: Never used   Substance and Sexual Activity   ? Alcohol use: Yes     Alcohol/week: 1.0 standard drinks     Types: 1 Cans of beer per week     Comment: OCCASIONALLY   ? Drug use: No   ? Sexual activity: Not on file   Other Topics Concern   ? Not on file   Social History Narrative   ? Not on file     Family History   Problem Relation Age of Onset   ? Stroke Father      Surgical History:   Procedure Laterality Date   ? INTRACARDIAC CATHETER ABLATION WITH COMPREHENSIVE ELECTROPHYSIOLOGIC EVALUATION - ATRIAL FIBRILLATION N/A 05/12/2018    Performed by Jen Mow, MD at [facility] EP LAB       Review of Systems   Constitutional: Negative.   HENT: Negative.    Eyes: Negative.    Cardiovascular: Positive for dyspnea on exertion and leg swelling.   Respiratory: Negative.    Endocrine: Negative.    Hematologic/Lymphatic: Negative.    Skin: Negative.    Musculoskeletal: Negative.    Gastrointestinal: Negative.    Genitourinary: Negative.    Neurological: Negative.    Psychiatric/Behavioral: Negative.    Allergic/Immunologic: Negative.        Physical Exam  Constitutional: No acute distress  HENT:  Head - normocephalic   Eyes: Conjunctivae normal  Neck: 8-9 JVP, + HJR  Cardiac Rhythm: Irregular rate, irregular rhythm  Cardiac Ausculation:  S1/S2 normal, no definitive S3 or S4, no gallop or friction rub  Murmurs: Murmur heard  Lower Extremity Edema: 1+ lower extremity edema  Pulmonary/Chest: Breathing comfortably, no respiratory distress, lungs clear to ausculation, no rales / rhonchi / wheezing  Abdominal: Firm, distended, non-tender, no obvious masses, bowel sounds present  Musculoskeletal: Moves all extremities, walks without assistance  Skin: Warm / dry, no erythema   Neurological: Alert and oriented to person, place, and time; no focal deficits  Psychiatric: Normal mood and affect; judgment, behavior, and thought content normal  Language and Memory: patient responsive and seems to comprehend information   Vital signs reviewed  ?  Cardiovascular Studies  09/06/2019 echocardiogram  ? The underlying rhythm is atrial fibrillation.  ? Normal left ventricular systolic function, estimated ejection fraction is 55%.  ? Severe concentric LVH, patient has known amyloidosis.  ? Probably severe diastolic dysfunction, elevated filling pressure.  ? The right ventricle is dilated, hypertrophy of the free wall, decreased systolic function.M-Mode TAPSE 1.2 cm (normal >1.7 cm).  ? Biatrial dilatation (LA?mildly dilated, RA?moderately dilated).  ? Moderate mitral valve regurgitation, moderate to severe tricuspid valve regurgitation.  ? Severely sclerotic aortic valve, by spectral Doppler calculations mild stenosis (  MG= 12 mmHg, peak velocity= 2.3 m/sec, DI= 0.27 , AVA= 1.1 cm?), trace regurgitation. By 2D echo exam only, the aortic valve systolic opening is significantly restricted, this finding is compatible with severe aortic valve stenosis. (NF-LG severe AS).  ? Estimated Peak Systolic PA Pressure 31 mmHg      Problems Addressed Today  Encounter Diagnoses   Name Primary?   ? Chronic heart failure with preserved ejection fraction (HCC) Yes   ? Longstanding persistent atrial fibrillation (HCC)    ? Essential hypertension    ? Aortic stenosis, mild    ? Wild-type transthyretin-related (ATTR) amyloidosis (HCC)    ? Nonischemic cardiomyopathy (HCC)        Assessment and Plan     Chronic Heart Failure with Preserved Ejection Fraction:    -Echo on 01/26/19 showed an EF of 60%.  -GDMT: Bumex 3 mg twice daily  -Today, he  describes NYHA Functional Class III symptoms, appears euvolemic by exam.  He is scheduled for right heart catheterization with CardioMEMS on 04/16/2019.  He has been given preprocedure instructions.  His preprocedure labs were completed last week.  Preliminary read on EKG today shows atrial fibrillation, rate 94.  ?  Nonischemic Cardiomyopathy:    -GDMT as described above.  ?  ATTR Amyloidosis:  -PYP scan on 06/28/2019 that showed a 2-hour heart/contralateral lung ratio of 1.97 with visual semiquantitative grading grade 3, abnormal and highly suggestive of ATTR cardiac amyloidosis.  He has had kappa 7.45, lambda 4.07 and free light chain ratio 1.83 on 4/26.  He had serum electrophoresis that showed no paraprotein.  He did a 6-minute walk test on 5/18, he was able to walk 289.75 m.  He has completed a KCC Q score that was 72.92 indicative of fair to good quality of life.  He has been started on tafamidis 61 mg daily.  He had a baseline CPET on 7/19.   ?  Atrial Fibrillation (Persistent):  -He has had several cardioversions in the past.  -He is on diltiazem and denies palpitations.  -He  is on Xarelto oral anticoagulation with no significant signs / symptoms of bleeding.  ?  Aortic Stenosis:  -Echocardiogram on 6/8 showed severely sclerotic aortic valve by spectral Doppler calculation mild stenosis with mean gradient of 12 mmHg, the AVA is 1.1 cm.  -Continue to monitor    Hypertension:  -Controlled on current regimen, continue to monitor.  ?  Chronic Kidney Disease - Stage III:  -Most recent creatinine on 12/08/2019 was 2.11.  For the last several months his creatinine has been elevated, anywhere from 1.7-3.51.    ?  Follow Up Visit:  Right heart catheterization with CardioMEMS implant on 12/15/2019   ?  ?  I have personally documented the HPI, exam and medical decision making.  Patient education: I reviewed recent lab results and current medications, medication instructions, discussed heart failure signs & symptoms,? low  sodium diet, fluid restriction and daily weights. I have instructed the patient on the plan of care and they verbalize understanding of the plan. Please see AVS for full patient teaching. Patient advised to call our office if s/he has any problems, questions, worsening symptoms, or concerns prior to the next appointment. ?   ?  ?  Total time  30 minutes.  Estimated counseling time 20 minutes.   ?  Thank you for allowing me to participate in the care of this patient. If you have any questions please do not hesitate to contact our office.  ?  ?  Justus Memory, DNP, APRN, NP-C  Collaborating Physician: Dr. Earnest Bailey  [facility]  Phone: [phone] Fax: [phone]         Current Medications (including today's revisions)  ? amiodarone (CORDARONE) 200 mg tablet Take 2 tablets by mouth twice daily through 6/16.  Starting 6/17 take 2 tablets by mouth daily for 2 weeks then starting 7/1 take 1 tablet by mouth daily. Take with food.   ? bumetanide (BUMEX) 1 mg tablet Take three tablets by mouth twice daily. Take 3mg  every morning. In the afternoon, alternate 2mg  and 3mg  every other day.   ? cholecalciferol (vitamin D3) (VITAMIN D3 PO) Take 1 tablet by mouth daily.   ? docusate (COLACE) 100 mg capsule Take 100 mg by mouth daily.   ? esomeprazole DR(+) (NEXIUM) 20 mg capsule Take 20 mg by mouth daily. Take on an empty stomach at least 1 hour before or 2 hours after food.   ? ferrous sulfate (FEOSOL) 325 mg (65 mg iron) tablet Take 325 mg by mouth twice daily. Take on an empty stomach at least 1 hour before or 2 hours after food.    ? fish oil /omega-3 fatty acids (SEA-OMEGA) 340/1000 mg capsule Take 1 capsule by mouth every 48 hours.   ? fluticasone/salmeterol (ADVAIR) 100/50 mcg inhalation disk Inhale 1 puff by mouth into the lungs twice daily.   ? lactobacillus rhamnosus (GG) (CULTURELLE) 10 billion cell cap Take 1 capsule by mouth daily with breakfast.   ? magnesium oxide (MAG-OX) 400 mg (241.3 mg magnesium) tablet Take one tablet by mouth daily.   ? potassium chloride SR (K-DUR) 20 mEq tablet Take two tablets by mouth daily. Take with a meal and a full glass of water   ? rivaroxaban (XARELTO) 15 mg tablet Take one tablet by mouth daily with breakfast. Take with food. Hold the day before and the morning of your CardioMEMS procedure.   ? rosuvastatin (CRESTOR) 5 mg tablet Take one tablet by mouth three times weekly.   ? sildenafiL (VIAGRA) 100 mg tablet TAKE 1 TABLET BY MOUTH ONCE DAILY AS NEEDED FOR SEXUAL ACTIVITY   ? tafamidis (VYNDAMAX) 61 mg capsule Take one capsule by mouth daily.   ? VENTOLIN HFA 90 mcg/actuation inhaler Inhale 1 puff by mouth into the lungs as Needed.

## 2019-12-15 ENCOUNTER — Encounter: Admit: 2019-12-15 | Discharge: 2019-12-15 | Payer: MEDICARE

## 2019-12-15 DIAGNOSIS — Z959 Presence of cardiac and vascular implant and graft, unspecified: Secondary | ICD-10-CM

## 2019-12-15 DIAGNOSIS — I5032 Chronic diastolic (congestive) heart failure: Secondary | ICD-10-CM

## 2019-12-15 MED ADMIN — BUMETANIDE 0.25 MG/ML IJ SOLN [9308]: 4 mg | INTRAVENOUS | @ 22:00:00 | NDC 00409141234

## 2019-12-15 MED ADMIN — ALUMINUM-MAGNESIUM HYDROXIDE 200-200 MG/5 ML PO SUSP [92642]: 30 mL | ORAL | @ 15:00:00 | Stop: 2019-12-15 | NDC 00121176030

## 2019-12-15 MED ADMIN — SODIUM CHLORIDE 0.9 % IV SOLP [27838]: 1000 mL | INTRAVENOUS | @ 13:00:00 | Stop: 2019-12-15 | NDC 00338004904

## 2019-12-15 MED ADMIN — POTASSIUM CHLORIDE 20 MEQ PO TBTQ [35943]: 40 meq | ORAL | @ 22:00:00 | NDC 00245531989

## 2019-12-15 NOTE — Telephone Encounter
Successful CardioMEMS implant today. Reviewed RHC pressures with Dr. Mike Craze Shah--Order given to admit patient to internal medicine with HF Consult for diuresis.     12/15/19 IMPLANT RHC:    BP: 126/76 (99)  RA: 27  RV: 60/32  PA: 57/31 (42)  PCWP: 30  TPG: 12  PVR: 3.8  SVR: 1840  CO (Fick): 3.1  CI (Fick): 1.7    AVO2 Diff 7.18        CARDIAC MEDS:    Home Medications    Medication Sig   amiodarone (CORDARONE) 200 mg tablet Take 2 tablets by mouth twice daily through 6/16.  Starting 6/17 take 2 tablets by mouth daily for 2 weeks then starting 7/1 take 1 tablet by mouth daily. Take with food.   bumetanide (BUMEX) 1 mg tablet Take three tablets by mouth twice daily. Take 3mg  every morning. In the afternoon, alternate 2mg  and 3mg  every other day.   cholecalciferol (vitamin D3) (VITAMIN D3 PO) Take 1 tablet by mouth daily.   docusate (COLACE) 100 mg capsule Take 100 mg by mouth daily.   esomeprazole DR(+) (NEXIUM) 20 mg capsule Take 20 mg by mouth daily. Take on an empty stomach at least 1 hour before or 2 hours after food.   ferrous sulfate (FEOSOL) 325 mg (65 mg iron) tablet Take 325 mg by mouth twice daily. Take on an empty stomach at least 1 hour before or 2 hours after food.    fish oil /omega-3 fatty acids (SEA-OMEGA) 340/1000 mg capsule Take 1 capsule by mouth every 48 hours.   fluticasone/salmeterol (ADVAIR) 100/50 mcg inhalation disk Inhale 1 puff by mouth into the lungs twice daily.   lactobacillus rhamnosus (GG) (CULTURELLE) 10 billion cell cap Take 1 capsule by mouth daily with breakfast.   magnesium oxide (MAG-OX) 400 mg (241.3 mg magnesium) tablet Take one tablet by mouth daily.   potassium chloride SR (K-DUR) 20 mEq tablet Take two tablets by mouth daily. Take with a meal and a full glass of water   rivaroxaban (XARELTO) 15 mg tablet Take one tablet by mouth daily with breakfast. Take with food. Hold the day before and the morning of your CardioMEMS procedure.   rosuvastatin (CRESTOR) 5 mg tablet Take one tablet by mouth three times weekly.   sildenafiL (VIAGRA) 100 mg tablet TAKE 1 TABLET BY MOUTH ONCE DAILY AS NEEDED FOR SEXUAL ACTIVITY   tafamidis (VYNDAMAX) 61 mg capsule Take one capsule by mouth daily.   VENTOLIN HFA 90 mcg/actuation inhaler Inhale 1 puff by mouth into the lungs as Needed.       RECENT LABS:    Basic Metabolic Profile    Lab Results   Component Value Date/Time    NA 139 12/08/2019 12:00 AM    K 3.7 12/08/2019 12:00 AM    CA 9.5 12/08/2019 12:00 AM    CL 99 12/08/2019 12:00 AM    CO2 28.0 12/08/2019 12:00 AM    GAP 16 (H) 12/08/2019 12:00 AM    Lab Results   Component Value Date/Time    BUN 49.0 (H) 12/08/2019 12:00 AM    CR 2.11 (H) 12/08/2019 12:00 AM    GLU 127 (H) 12/08/2019 12:00 AM

## 2019-12-16 MED ADMIN — POTASSIUM CHLORIDE 20 MEQ PO TBTQ [35943]: 20 meq | ORAL | @ 16:00:00 | Stop: 2019-12-16 | NDC 00245531989

## 2019-12-16 MED ADMIN — PANTOPRAZOLE 20 MG PO TBEC [79463]: 20 mg | ORAL | @ 02:00:00 | NDC 50268058511

## 2019-12-16 MED ADMIN — RIVAROXABAN 15 MG PO TAB [309660]: 15 mg | ORAL | @ 13:00:00 | Stop: 2019-12-16 | NDC 50458057801

## 2019-12-16 MED ADMIN — AMIODARONE 200 MG PO TAB [9066]: 200 mg | ORAL | @ 13:00:00 | Stop: 2019-12-16 | NDC 00904699361

## 2019-12-16 MED ADMIN — BUMETANIDE 0.25 MG/ML IJ SOLN [9308]: 4 mg | INTRAVENOUS | @ 14:00:00 | Stop: 2019-12-16 | NDC 00409141234

## 2019-12-16 MED ADMIN — FERROUS SULFATE 325 MG (65 MG IRON) PO TAB [3074]: 325 mg | ORAL | @ 02:00:00 | NDC 00904759161

## 2019-12-16 MED ADMIN — LACTOBACILLUS RHAMNOSUS GG 15 BILLION CELL PO CPSP [317670]: 1 | ORAL | @ 13:00:00 | Stop: 2019-12-16 | NDC 49100036374

## 2019-12-16 MED ADMIN — POTASSIUM CHLORIDE 20 MEQ PO TBTQ [35943]: 40 meq | ORAL | @ 13:00:00 | Stop: 2019-12-16 | NDC 00245531989

## 2019-12-16 MED ADMIN — MAGNESIUM OXIDE 400 MG (241.3 MG MAGNESIUM) PO TAB [10491]: 400 mg | ORAL | @ 13:00:00 | Stop: 2019-12-16 | NDC 10006070028

## 2019-12-16 MED ADMIN — FERROUS SULFATE 325 MG (65 MG IRON) PO TAB [3074]: 325 mg | ORAL | @ 13:00:00 | Stop: 2019-12-16 | NDC 00904759161

## 2019-12-16 MED ADMIN — CHOLECALCIFEROL (VITAMIN D3) 25 MCG (1,000 UNIT) PO TAB [130074]: 1000 [IU] | ORAL | @ 13:00:00 | Stop: 2019-12-16 | NDC 20555003300

## 2019-12-16 MED ADMIN — ALUMINUM-MAGNESIUM HYDROXIDE 200-200 MG/5 ML PO SUSP [92642]: 15 mL | ORAL | @ 04:00:00 | Stop: 2019-12-16 | NDC 00121176030

## 2019-12-16 MED ADMIN — FLUTICASONE PROPION-SALMETEROL 100-50 MCG/DOSE IN DSDV [82024]: 1 | RESPIRATORY_TRACT | @ 11:00:00 | Stop: 2019-12-16 | NDC 00173069504

## 2019-12-16 MED ADMIN — CHLOROTHIAZIDE SODIUM 500 MG IV SOLR [81318]: 250 mg | INTRAVENOUS | @ 16:00:00 | Stop: 2019-12-16 | NDC 63323065820

## 2019-12-20 ENCOUNTER — Ambulatory Visit: Admit: 2019-12-20 | Discharge: 2019-12-20 | Payer: MEDICARE

## 2019-12-20 ENCOUNTER — Encounter: Admit: 2019-12-20 | Discharge: 2019-12-20 | Payer: MEDICARE

## 2019-12-20 DIAGNOSIS — I5032 Chronic diastolic (congestive) heart failure: Secondary | ICD-10-CM

## 2019-12-20 DIAGNOSIS — I10 Essential (primary) hypertension: Secondary | ICD-10-CM

## 2019-12-20 DIAGNOSIS — I5033 Acute on chronic diastolic (congestive) heart failure: Secondary | ICD-10-CM

## 2019-12-20 DIAGNOSIS — Z9889 Other specified postprocedural states: Secondary | ICD-10-CM

## 2019-12-20 DIAGNOSIS — E785 Hyperlipidemia, unspecified: Secondary | ICD-10-CM

## 2019-12-20 DIAGNOSIS — C4490 Unspecified malignant neoplasm of skin, unspecified: Secondary | ICD-10-CM

## 2019-12-20 DIAGNOSIS — N1832 Chronic kidney disease, stage 3b (HCC): Secondary | ICD-10-CM

## 2019-12-20 DIAGNOSIS — J302 Other seasonal allergic rhinitis: Secondary | ICD-10-CM

## 2019-12-20 DIAGNOSIS — I358 Other nonrheumatic aortic valve disorders: Secondary | ICD-10-CM

## 2019-12-20 LAB — BASIC METABOLIC PANEL
Lab: 137 MMOL/L (ref 137–147)
Lab: 2 mg/dL — ABNORMAL HIGH (ref 0.4–1.24)
Lab: 31 mL/min — ABNORMAL LOW (ref >60)
Lab: 37 MMOL/L — ABNORMAL HIGH (ref 21–30)
Lab: 37 mL/min — ABNORMAL LOW (ref >60)
Lab: 4.1 MMOL/L (ref 3.5–5.1)
Lab: 57 mg/dL — ABNORMAL HIGH (ref 7–25)
Lab: 67 mg/dL — ABNORMAL LOW (ref 70–100)
Lab: 8 (ref 3–12)
Lab: 9.7 mg/dL (ref 8.5–10.6)
Lab: 92 MMOL/L — ABNORMAL LOW (ref 98–110)

## 2019-12-22 ENCOUNTER — Encounter: Admit: 2019-12-22 | Discharge: 2019-12-22 | Payer: MEDICARE

## 2019-12-22 ENCOUNTER — Ambulatory Visit: Admit: 2019-12-22 | Discharge: 2019-12-22 | Payer: MEDICARE

## 2019-12-22 DIAGNOSIS — E8582 Wild-type transthyretin-related (ATTR) amyloidosis: Secondary | ICD-10-CM

## 2019-12-22 DIAGNOSIS — I10 Essential (primary) hypertension: Principal | ICD-10-CM

## 2019-12-22 DIAGNOSIS — J302 Other seasonal allergic rhinitis: Secondary | ICD-10-CM

## 2019-12-22 DIAGNOSIS — I358 Other nonrheumatic aortic valve disorders: Secondary | ICD-10-CM

## 2019-12-22 DIAGNOSIS — C4490 Unspecified malignant neoplasm of skin, unspecified: Secondary | ICD-10-CM

## 2019-12-22 DIAGNOSIS — I350 Nonrheumatic aortic (valve) stenosis: Secondary | ICD-10-CM

## 2019-12-22 DIAGNOSIS — Z95818 Presence of other cardiac implants and grafts: Secondary | ICD-10-CM

## 2019-12-22 DIAGNOSIS — I5030 Unspecified diastolic (congestive) heart failure: Secondary | ICD-10-CM

## 2019-12-22 DIAGNOSIS — E785 Hyperlipidemia, unspecified: Secondary | ICD-10-CM

## 2019-12-22 MED ORDER — POLYETHYLENE GLYCOL 3350 17 GRAM PO PWPK
17 g | Freq: Every day | ORAL | 1 refills | 18.00000 days | Status: AC
Start: 2019-12-22 — End: ?

## 2019-12-22 MED ORDER — SENNOSIDES-DOCUSATE SODIUM 8.6-50 MG PO TAB
1 | ORAL_TABLET | ORAL | 1 refills | Status: AC | PRN
Start: 2019-12-22 — End: ?

## 2019-12-22 NOTE — Progress Notes
Patient has OV with Ray Abraham, APRN this morning.  He was admitted 9/16 after CardioMEMS implant for diuresis.     Please see PTA vs DC HF meds:  Medication PTA Dose 12/15/19 DC Dose 12/16/19   Amiodarone 200mg  Daily 200mg  Daily   Bumex Bumex 3 mg am and 2 mg pm (alternating every other day with 3 mg pm). 4mg  BID   Metolazone N/A 2.5mg  Daily PRN   Potassium Daily Daily   Tafamidis 61mg  Daily 61mg  Daily       12/15/19 IMPLANT RHC:     BP: 126/76 (99)  RA: 27  RV: 60/32  PA: 57/31 (42)  PCWP: 30  TPG: 12  PVR: 3.8  SVR: 1840  CO (Fick): 3.1  CI (Fick): 1.7     AVO2 Diff 7.18    Daily CardioMEMS Readings    Goal PA Diastolic: TBD  PA Diastolic Thresholds: TBD    Taken on PA Systolic PA Diastolic PA Mean Heart Rate   [id number], 10:04 AM 53 mmHg 25 mmHg 37 mmHg 82 bpm   12-20-2019, 10:28 AM 39 mmHg 21 mmHg 28 mmHg 91 bpm   12-19-2019, 10:49 AM 42 mmHg 22 mmHg 29 mmHg 86 bpm   12-18-2019, 10:50 AM 38 mmHg 21 mmHg 27 mmHg 92 bpm   12-17-2019, 12:28 PM 45 mmHg 24 mmHg 32 mmHg 90 bpm   12-16-2019, 10:22 AM 55 mmHg 31 mmHg 41 mmHg 87 bpm   12-15-2019, 10:12 AM 60 mmHg 34 mmHg 45 mmHg 86 bpm   12-15-2019, 09:40 AM 57 mmHg 31 mmHg 42 mmHg 90 bpm   12-15-2019, 09:39 AM 55 mmHg 28 mmHg 40 mmHg 80 bpm   12-15-2019, 09:39 AM 55 mmHg 30 mmHg 41 mmHg 92 bpm   12-15-2019, 09:38 AM 55 mmHg 29 mmHg 40 mmHg 84 bpm   12-15-2019, 09:38 AM 53 mmHg 29 mmHg 40 mmHg 91 bpm

## 2019-12-22 NOTE — Progress Notes
Date of Service: 12/22/2019    Ray [name] is a 84 y.o. male.       HPI      I had the pleasure of seeing Ray [name], who is accompanied by his wife Ray [name] today in [place] cardiovascular medicine clinic for post hospital follow-up.  He had been hospitalized at [facility] from 9/16 through 9/17 for acute on chronic heart failure with preserved ejection fraction.  He had presented on 9/16 for a scheduled CardioMEMS implant, he had a new finding of high cardiac filling pressures with low cardiac indices.  Heart failure was consulted and he was aggressively diuresed with IV Bumex and also required 1 dose of IV Diuril.  His PTA Bumex was increased on discharge to 4 mg twice daily with a as needed metolazone. A repeat BMP on 9/21 showed potassium 4.1 and creatinine 2.08.  He saw Dr. Mercie Eon on 9/21.  His CardioMEMS readings are being established, on 9/22 his PA diastolic was 25, on day of implant it was 31.  His thresholds are being established.    Today he reports that he is feeling well, he has occasional leg cramps, is occasionally off balance when he changes positions however home blood pressures have been stable at 112/60-70.  He has had some problems with constipation since his diuretics have been increased.  He denies any chest pain, orthopnea, cough or PND.  He thinks the swelling in his legs is much improved.         Vitals:    12/22/19 0816   BP: 116/52   BP Source: Arm, Left Upper   Patient Position: Sitting   Pulse: 90   Resp: 16   SpO2: 98%   Weight: 67.7 kg (149 lb 3.2 oz)   Height: 1.626 m (5' 4)   PainSc: Zero     Body mass index is 25.61 kg/m?[name]     Past Medical History  Patient Active Problem List    Diagnosis Date Noted   ? S/P left pulmonary artery pressure sensor implant placement 12/15/2019     *Patient has CardioMEMS (Pulmonary Artery Pressure Sensor)* Please call [name], CardioMEMs Program Coordinator [phone] or page Heart Failure Rounding Team if patient is admitted or presents to ED*          ? Chronic heart failure with preserved ejection fraction (HCC) 12/15/2019   ? Acute on chronic heart failure with preserved ejection fraction (HFpEF) (HCC) 11/14/2019   ? 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  ?  01/26/2019 Echo EF:?60%  IVS 1.50 cm (Range: 0.6 - 1.0)         LV PW 1.40 cm (Range: 0.6 - 1.0)         ?  1. Moderate concentric left ventricular hypertrophy  2. Unable to assess diastolic function due to atrial fibrillation  3. Moderate biatrial dilatation  4. Normal right ventricular size and systolic function  5. Normal central venous pressure  6. Mild calcific aortic valve stenosis. ?Mean gradient is 16 mmHg with a peak velocity of 2.8 m/s. ?Trace aortic valve regurgitation is seen  7. Mitral valve is structurally okay. ?There is moderate central regurgitation. ?No stenosis  8. Moderate tricuspid valve regurgitation  9. Pulmonary artery static pressure measures at 33 mmHg   06/28/2019 TC PYP Multiview planar views and SPECT imaging confirms the presence of severe marked diffuse global left ventricular and right ventricular myocardial uptake of tracer. ?There is normal rib  and bone uptake.  ?  Two hour heart/contralateral lung (H/CL) ratio: ?1.977?this metric is abnormal and highly suggestive of ATTR cardiac amyloidosis.?  ?  Visual Semi-Quantitative Grading Scale Analysis:?  The study is grade 3 indicating myocardial uptake greater than rib and bone uptake. ?The pattern is highly and strongly suggestive of ATTR cardiac amyloidosis.  ?  SUMMARY/OPINION:   This study is abnormal and strongly suggestive of ATTR cardiac amyloidosis. ?Left ventricular myocardial uptake is diffuse, intense, and global. ?There is also prominent right ventricular free wall uptake of tracer. ?The pattern is typical for ATTR cardiac amyloidosis   ? Cardiac MRI ?   07/25/2019 Kappa/Lambda FLC ?  ? Ref. Range 07/25/2019 15:48   Kappa, FLC Latest Ref Range: 0.33 - 1.94 MG/DL 6.29 (H)   Lambda, FLC Latest Ref Range: 0.57 - 2.63 MG/DL 5.28 (H)   Kappa/Lambda FLC Latest Ref Range: 0.26 - 1.65  1.83 (H)      07/25/2019 Serum Immunofixation ?NO PARAPROTEIN SEEN   cancelled Urine Immunofixation ?Patient unable to provide urine sample 4/26    07/25/2019 Genetic Testing ?Negative   ? Biopsy (Fat Pad, Cardiac, Bone Marrow) ?   ? GI Symptoms ?   ?+? 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   ?  ? Ref. Range 07/25/2019 15:48   B Type Natriuretic Peptide Latest Ref Range: 0 - 100 PG/ML 966.0 (H)   Troponin-I Latest Ref Range: 0.0 - 0.05 NG/ML 0.05   ?  Completed:  -Tafamidis start 08/2019. PA approved, pt approved for [employer] patient assistance from 09/01/2019 to 03/30/2020  -Baseline KCCQ12 completed 5/18/202, Summary Score 61.46  -Baseline 6 min walk completed 08/16/2019, distance walked 950 feet, 289.75 meters, duration of test 6 minutes  -Baseline CPET completed 10/17/2019       ? S/P ablation of atrial fibrillation 05/18/2018   ? Lightheadedness 03/16/2018   ? Hospitalization within last 30 days 12/24/2017   ? Atrial fibrillation with RVR (HCC) 12/14/2017   ? Stage 3b chronic kidney disease (HCC) 12/13/2017   ? Cardiogenic shock (HCC) 12/12/2017   ? Bradycardia 12/12/2017   ? Bilateral leg edema 12/11/2017   ? On amiodarone therapy 12/11/2017   ? Heart failure with preserved ejection fraction (HCC) 01/01/2016   ? NSVT (nonsustained ventricular tachycardia) (HCC) 06/12/2015   ? Medication side effects 11/09/2014   ? Heart palpitations 09/19/2014   ? Longstanding persistent atrial fibrillation (HCC) 03/17/2013     03/26/2018 - ECHO:  Moderate concentric LVH seen. The EF is about 55%.  The atria are slightly dilated.  The RV function is mildly decreased.  Mild to moderate MR and TR seen.  Mild AI is present.  Based on the gradients and appearance, the AS appears to be mild. The mean gradient is 16 mm Hg with a peak velocity of 2.6 m/s.  03/26/2018 - Zio Patch:  Predominant rhythm was normal sinus.  Two runs of ventricular tachycardia occurred, they were monomorphic, the longest run lasted 3 seconds, this episode occurred on April 08, 2018 at 9:41 PM.  One episode of AIVR also appeared to be present, it lasted approximately 4.6 seconds  Brief and rare episodes of SVT that probably represent atrial fibrillation, the longest lasting 6.3 seconds.  No evidence of atrial flutter and no evidence of high degree AV block.  05/12/2018 - LAAA:  Persistent Atrial Fibrillation status post successful pulmonary vein antral isolation.  Cavotricuspid Isthmus Ablation.  Ablation of Fractionated Intracardiac Electrograms.  Ablation for an Additional Discrete Arrhythmia-LA AFL after AFIB/PVI Ablation  06/23/2018 - Cardioversion:  Successful direct current cardioversion from symptomatic atrial fibrillation to sinus rhythm.  03/16/2019 - DCCV:  Successful direct current cardioversion from atrial fibrillation to sinus rhythm.     ? Systolic murmur of aorta 02/04/2013   ? SOB (shortness of breath) 02/04/2013   ? Carotid bruit present 01/23/2012   ? Aortic stenosis, mild 01/30/2011   ? Overweight (BMI 25.0-29.9) 01/30/2011   ? Essential hypertension 01/30/2009   ? Hyperlipidemia 01/30/2009     Hyperlipidemia, on statin therapy     ? Skin cancer 01/30/2009   ? Seasonal allergic reaction 01/30/2009         Review of Systems   Constitutional: Negative.   HENT: Negative.    Eyes: Negative.    Cardiovascular: Negative.    Respiratory: Negative.    Endocrine: Negative.    Hematologic/Lymphatic: Negative.    Skin: Negative.    Musculoskeletal: Negative.    Gastrointestinal: Positive for constipation.   Genitourinary: Negative.    Neurological: Negative.    Psychiatric/Behavioral: Negative. Allergic/Immunologic: Negative.        Physical Exam  General Appearance: In NAD  Neck Veins: 9 cm JVP, neck veins are distended, + HJR   Chest Inspection: chest is normal in appearance   Respiratory Effort: breathing comfortably, no respiratory distress   Auscultation/Percussion: lungs clear to auscultation, no rales, rhonchi or wheezing   Cardiac Rhythm: irregularly irregular rhythm and normal rate   Cardiac Auscultation: S1, S2 normal, no rub, no definite S3  or S4   Murmurs: grade iii/vi systolic murmur   Peripheral Circulation: normal peripheral circulation   Pedal Pulses: normal symmetric pedal pulses   Lower Extremity Edema: Trace right ankle, bilateral lower legs have venous discoloration  Abdominal Exam: soft, non-tender, no obvious masses, bowel sounds normal   Gait & Station: walks without assistance   Orientation: oriented to person, place and time   Affect & Mood: appropriate and sustained affect   Language and Memory: patient responsive and seems to comprehend information   Neurologic Exam: neurological assessment grossly intact   Vital signs were reviewed  Lower back incision dressing removed and replaced, moh's procedure tunneled    Cardiovascular Studies: echo 09/06/2019  ? The underlying rhythm is atrial fibrillation.  ? Normal left ventricular systolic function, estimated ejection fraction is 55%.  ? Severe concentric LVH, patient has known amyloidosis.  ? Probably severe diastolic dysfunction, elevated filling pressure.  ? The right ventricle is dilated, hypertrophy of the free wall, decreased systolic function.M-Mode TAPSE 1.2 cm (normal >1.7 cm).  ? Biatrial dilatation (LA?mildly dilated, RA?moderately dilated).  ? Moderate mitral valve regurgitation, moderate to severe tricuspid valve regurgitation.  ? Severely sclerotic aortic valve, by spectral Doppler calculations mild stenosis (MG= 12 mmHg, peak velocity= 2.3 m/sec, DI= 0.27 , AVA= 1.1 cm?), trace regurgitation. By 2D echo exam only, the aortic valve systolic opening is significantly restricted, this finding is compatible with severe aortic valve stenosis. (NF-LG severe AS).  ? Estimated Peak Systolic PA Pressure 31 mmHg  Problems Addressed Today  Encounter Diagnoses   Name Primary?   ? Aortic stenosis, mild Yes       Assessment and Plan   1.  Wild-type amyloidosis: He has had a previous technetium pyrophosphate scan on 06/28/2019 that showed a  2-hour heart/contralateral lung ratio of 1.97 with visual semiquantitative grading grade 3, abnormal and highly suggestive of ATTR cardiac amyloidosis.  He has had kappa 7.45, lambda 4.07 and free light chain ratio 1.83 on 4/26.  He had serum electrophoresis that showed no paraprotein.  He did a 6-minute walk test on 5/18, he was able to walk 289.75 m.  He has completed a KCC Q score that was 72.92 indicative of fair to good quality of life.  He has been started on tafamidis 61 mg daily.  He had a baseline CPET on 7/19.  We will ask him to see Dr. [name] in December.  2.  Heart failure with preserved ejection fraction: He appears euvolemic on physical exam on  Bumex 4 mg p.o. twice daily.   3.  CardioMEMS in situ: His thresholds are being established at this time, PAD is markedly improved since implant, yesterday his PA diastolic was 25.  I reviewed his CardioMEMS results with him, he has not had any difficulty with figuring out how to use the CardioMEMS pillow.    12/15/19 IMPLANT RHC:?  BP:?126/76 (99)  RA:?27  RV:?60/32  PA:?57/31 (42)  PCWP:?30  TPG:?12  PVR:?3.8  SVR:?1840  CO (Fick):?3.1  CI (Fick):?1.7?  AVO2 Diff 7.18?  Daily CardioMEMS Readings  ?Goal PA Diastolic: TBD  PA Diastolic Thresholds: TBD  ?  Taken on PA Systolic PA Diastolic PA Mean Heart Rate   16-12-9602, 10:04 AM 53 mmHg 25 mmHg 37 mmHg 82 bpm   12-20-2019, 10:28 AM 39 mmHg 21 mmHg 28 mmHg 91 bpm   12-19-2019, 10:49 AM 42 mmHg 22 mmHg 29 mmHg 86 bpm   12-18-2019, 10:50 AM 38 mmHg 21 mmHg 27 mmHg 92 bpm   12-17-2019, 12:28 PM 45 mmHg 24 mmHg 32 mmHg 90 bpm   12-16-2019, 10:22 AM 55 mmHg 31 mmHg 41 mmHg 87 bpm   12-15-2019, 10:12 AM 60 mmHg 34 mmHg 45 mmHg 86 bpm   12-15-2019, 09:40 AM 57 mmHg 31 mmHg 42 mmHg 90 bpm   12-15-2019, 09:39 AM 55 mmHg 28 mmHg 40 mmHg 80 bpm   12-15-2019, 09:39 AM 55 mmHg 30 mmHg 41 mmHg 92 bpm   12-15-2019, 09:38 AM 55 mmHg 29 mmHg 40 mmHg 84 bpm   12-15-2019, 09:38 AM 53 mmHg 29 mmHg 40 mmHg 91 bpm   ?     4.  Aortic stenosis: The most recent echocardiogram on 6/8 showed severely sclerotic aortic valve by spectral Doppler calculation mild stenosis with mean gradient of 12 mmHg, the AVA is 1.1 cm.  I discussed this with Dr. [name], will continue to monitor this in the future.  5.  CKD: His creatinine on 9/21 was 2.08 h Dr. Mercie Eon on September 21.  6.  Atrial fibrillation: He continues to take amiodarone 200 mg p.o.daily, he is on oral anticoagulation with Xarelto 15 mg daily, he denies any upper or lower GI bleeding symptoms.  He has been in atrial fibrillation since at least 2020, has had previous ablations.  7.  Constipation: I have been instructed him to start with Senokot with daily once daily along with MiraLAX once daily and can go up to twice daily dosing as needed for constipation.  ________________________________________________________________________________  DISEASE PREVENTION:   Lipids/Statin treatment:  Yes.   Currently on atorvastatin 20 mg daily   for Goal LDL < 70, Triglycerides < 540 mg/dl.   HTN: Optimally controlled on current medical therapy.  Goal Systolic < 130, diastolic < 90.  Diabetes:  Denies     Tobacco: Denies        Obesity/Nutrition/Physical Activity: BMI 25.61  ____________________________________________________________________________________     have personally documented the HPI, exam and medical decision making.  Patient education: I reviewed recent lab results and current medications, medication instructions, discussed heart failure signs & symptoms,  low  sodium diet, fluid restriction and daily weights.I have instructed the patient on the plan of care and they verbalize understanding of the plan. Please see AVS for full patient teaching. Patient advised to call our office if s/he has any problems, questions, worsening symptoms, or concerns prior to the next appointment.   Thanks for allowing me to see this nice patient. If I can be of additional assistance, please don't hesitate to contact me.     [name] AGPCNP-BC, [facility]  Pager [phone]  Collaborating physician: Dr. Vanetta Shawl              No diagnosis found.                    Current Medications (including today's revisions)  ? amiodarone (CORDARONE) 200 mg tablet Take 2 tablets by mouth twice daily through 6/16.  Starting 6/17 take 2 tablets by mouth daily for 2 weeks then starting 7/1 take 1 tablet by mouth daily. Take with food. (Patient taking differently: Take 200 mg by mouth daily.)   ? bumetanide (BUMEX) 2 mg tablet Take two tablets by mouth twice daily.   ? cholecalciferol (vitamin D3) (VITAMIN D3 PO) Take 1 tablet by mouth daily. Pt. Unsure of dosage   ? docusate (COLACE) 100 mg capsule Take 100 mg by mouth daily.   ? esomeprazole DR(+) (NEXIUM) 20 mg capsule Take 20 mg by mouth daily. Take on an empty stomach at least 1 hour before or 2 hours after food.   ? ferrous sulfate (FEOSOL) 325 mg (65 mg iron) tablet Take 325 mg by mouth twice daily. Take on an empty stomach at least 1 hour before or 2 hours after food.    ? fluticasone/salmeterol (ADVAIR) 100/50 mcg inhalation disk Inhale 1 puff by mouth into the lungs daily.   ? lactobacillus rhamnosus (GG) (CULTURELLE) 10 billion cell cap Take 1 capsule by mouth daily with breakfast.   ? magnesium oxide (MAG-OX) 400 mg (241.3 mg magnesium) tablet Take one tablet by mouth daily.   ? metOLazone (ZAROXOLYN) 2.5 mg tablet Take one tablet by mouth daily as needed. Take one dose on 9/18, then do not take again until directed by cardiology team.   ? potassium chloride SR (K-DUR) 20 mEq tablet Take two tablets by mouth daily. Take with a meal and a full glass of water. Take two tablets twice per day on 9/18, then resume daily dosing. (Patient taking differently: Take 40 mEq by mouth daily.)   ? rivaroxaban (XARELTO) 15 mg tablet Take one tablet by mouth daily with breakfast. Take with food. Hold the day before and the morning of your CardioMEMS procedure.   ? rosuvastatin (CRESTOR) 5 mg tablet Take one tablet by mouth three times weekly.   ? sildenafiL (VIAGRA) 100 mg tablet TAKE 1 TABLET BY MOUTH ONCE DAILY AS NEEDED FOR SEXUAL ACTIVITY   ? tafamidis (VYNDAMAX) 61 mg capsule Take one capsule by mouth daily.   ? VENTOLIN HFA 90 mcg/actuation inhaler Inhale 1 puff by mouth into the lungs as Needed.

## 2019-12-27 ENCOUNTER — Encounter: Admit: 2019-12-27 | Discharge: 2019-12-27 | Payer: MEDICARE

## 2019-12-27 NOTE — Telephone Encounter
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

## 2019-12-27 NOTE — Telephone Encounter
-----   Message from [place], MD sent at 12/25/2019  6:37 PM CDT -----  Can you check with him if he has seen Urology outside and if not and agreeable please place referral for hematuria

## 2020-01-09 ENCOUNTER — Encounter: Admit: 2020-01-09 | Discharge: 2020-01-09 | Payer: MEDICARE

## 2020-01-13 ENCOUNTER — Encounter: Admit: 2020-01-13 | Discharge: 2020-01-13 | Payer: MEDICARE

## 2020-01-13 MED ORDER — METOLAZONE 2.5 MG PO TAB
2.5 mg | ORAL_TABLET | ORAL | 0 refills | 84.00000 days | Status: AC
Start: 2020-01-13 — End: ?
  Filled 2020-01-13: qty 30, 84d supply, fill #1

## 2020-01-13 NOTE — Telephone Encounter
PADs above thresholds on recent readings. Reviewed with Ray Abraham, APRN: Patient to start taking metolazone 2.5 mg with additional 20 meq of potassium, have labs drawn in one week (standing lab on file at [facility]). Patient notified, states understanding, will start metolazone dose today.     DAILY CardioMEMS READINGS    Goal PA Diastolic: 22 mmHg  PA Diastolic Thresholds: 20-24 mmHg          CARDIAC MEDS:    Home Medications    Medication Sig   bumetanide (BUMEX) 2 mg tablet Take two tablets by mouth twice daily.   magnesium oxide (MAG-OX) 400 mg (241.3 mg magnesium) tablet Take one tablet by mouth daily.   metOLazone (ZAROXOLYN) 2.5 mg tablet Take one tablet by mouth daily as needed. Take one dose on 9/18, then do not take again until directed by cardiology team.   potassium chloride SR (K-DUR) 20 mEq tablet Take two tablets by mouth daily. Take with a meal and a full glass of water. Take two tablets twice per day on 9/18, then resume daily dosing.  Patient taking differently: Take 40 mEq by mouth daily.   tafamidis (VYNDAMAX) 61 mg capsule Take one capsule by mouth daily.       RECENT LABS:    Basic Metabolic Profile    Lab Results   Component Value Date/Time    NA 137 12/20/2019 02:37 PM    K 4.1 12/20/2019 02:37 PM    CA 9.7 12/20/2019 02:37 PM    CL 92 (L) 12/20/2019 02:37 PM    CO2 37 (H) 12/20/2019 02:37 PM    GAP 8 12/20/2019 02:37 PM    Lab Results   Component Value Date/Time    BUN 57 (H) 12/20/2019 02:37 PM    CR 2.08 (H) 12/20/2019 02:37 PM    GLU 67 (L) 12/20/2019 02:37 PM          UPCOMING APPOINTMENTS:    Future Appointments   Date Time Provider Department Center   01/16/2020 12:00 AM [name]IT[facility] Proced[facility]   01/26/2020 12:45 PM [facility] None   02/16/2020 12:00 AM [name]OTE [facility]ITORING [facility] Procedur   02/20/2020  3:30 PM Ray Mora, PA-C [facility]   03/19/2020 12:00 AM [name]OTE [facility]IT[facility]ING [facility] Procedur 04/19/2020 12:00 AM [name] Procedur   05/21/2020 12:00 AM [name]OTE [facility]IT[facility] Procedur   06/19/2020  1:00 PM Ray Mems, MD [facility]   06/19/2020  2:00 PM Ray Mems, MD [facility]   06/21/2020 12:00 AM [name] Procedur   07/23/2020 12:00 AM [name]OTE [facility]ITORING [facility] Procedur   08/23/2020 12:00 AM [name]OTE [facility]ITORING [facility] Procedur   09/24/2020 12:00 AM [name] REMOTE MONITORING [facility] Procedur   10/25/2020 12:00 AM [name] REMOTE MONITORING [facility] Procedur   11/26/2020 12:00 AM [name] REMOTE MONITORING [facility] Procedur   12/27/2020 12:00 AM [name] REMOTE MONITORING [facility] Procedur   01/28/2021 12:00 AM [name] REMOTE MONITORING [facility] Procedur

## 2020-01-13 NOTE — Telephone Encounter
-----   Message from Fredricka Bonine, APRN-NP sent at 01/13/2020  7:28 AM CDT -----  Please schedule metolazone 2.5 mg once weekly with extra 20 meq kdur.  Chemistry in 1 week, thanks. DM  ----- Message -----  From: Shirlean Kelly, RN  Sent: 01/12/2020   3:36 PM CDT  To: Fredricka Bonine, APRN-NP    PADs above thresholds on recent readings.   Taken on [place] Diastolic  01-12-2020, 12:33 PM 31 mmHg  01-11-2020, 10:42 AM 29 mmHg  01-10-2020, 09:40 AM 23 mmHg  01-09-2020, 10:34 AM 25 mmHg  01-08-2020, 08:37 AM 31 mmHg  01-07-2020, 11:18 AM 20 mmHg    Current medications:  Bumex 4 mg BID  Potassium 40 meq daily  Metolazone 2.5 mg PRN  Tafamadis 61 mg daily    1) One time dose of metolazone?  2) Any extra potassium with the metolazone?  3) Other changes?    Thanks--America Sandall

## 2020-01-16 ENCOUNTER — Ambulatory Visit: Admit: 2020-01-16 | Discharge: 2020-01-16 | Payer: MEDICARE

## 2020-01-16 ENCOUNTER — Encounter: Admit: 2020-01-16 | Discharge: 2020-01-16 | Payer: MEDICARE

## 2020-01-16 DIAGNOSIS — Z959 Presence of cardiac and vascular implant and graft, unspecified: Secondary | ICD-10-CM

## 2020-01-16 DIAGNOSIS — I5032 Chronic diastolic (congestive) heart failure: Secondary | ICD-10-CM

## 2020-01-17 ENCOUNTER — Encounter: Admit: 2020-01-17 | Discharge: 2020-01-17 | Payer: MEDICARE

## 2020-01-18 ENCOUNTER — Encounter: Admit: 2020-01-18 | Discharge: 2020-01-18 | Payer: MEDICARE

## 2020-01-19 ENCOUNTER — Encounter: Admit: 2020-01-19 | Discharge: 2020-01-19 | Payer: MEDICARE

## 2020-01-20 ENCOUNTER — Encounter: Admit: 2020-01-20 | Discharge: 2020-01-20 | Payer: MEDICARE

## 2020-01-20 NOTE — Progress Notes
The specialty pharmacy is currently waiting on the following information for Adah Salvage Codispoti's specialty medication, Vyndamax:    Patient's signature and Provider's signature    The specialty pharmacy team has requested this information and until it is received the specialty pharmacy will not be able to continue the process for specialty medication access.    Shelva Majestic  Pharmacy Patient Advocate  [phone]    *Have 2020 tax information on hand from previous submission. Only needing new signatures and dates.

## 2020-01-24 ENCOUNTER — Encounter: Admit: 2020-01-24 | Discharge: 2020-01-24 | Payer: MEDICARE

## 2020-02-03 ENCOUNTER — Encounter: Admit: 2020-02-03 | Discharge: 2020-02-03 | Payer: MEDICARE

## 2020-02-03 NOTE — Progress Notes
The specialty pharmacy is currently waiting on the following information for Ray [name]'s specialty medication, Vyndamax:    Provider's signature    The specialty pharmacy team has requested this information and until it is received the specialty pharmacy will not be able to continue the process for specialty medication access.    Shelva Majestic  Pharmacy Patient Advocate  [phone]    [phone] renewal

## 2020-02-08 ENCOUNTER — Encounter: Admit: 2020-02-08 | Discharge: 2020-02-08 | Payer: MEDICARE

## 2020-02-08 NOTE — Progress Notes
The RENEWAL for 2022 medication assistance application for Ray [name]'s specialty medication [name] has been submitted to [employer]dal[employer].  A decision from the drug company/manufacturer may take up to 30 business days once the new year starts.  The specialty team will continue to follow.    [place] [facility] [place]  [phone]

## 2020-02-14 ENCOUNTER — Encounter: Admit: 2020-02-14 | Discharge: 2020-02-14 | Payer: MEDICARE

## 2020-02-14 DIAGNOSIS — T887XXA Unspecified adverse effect of drug or medicament, initial encounter: Secondary | ICD-10-CM

## 2020-02-14 DIAGNOSIS — Z79899 Other long term (current) drug therapy: Secondary | ICD-10-CM

## 2020-02-14 MED ORDER — AMIODARONE 200 MG PO TAB
200 mg | ORAL_TABLET | Freq: Every day | ORAL | 1 refills | 42.00000 days | Status: AC
Start: 2020-02-14 — End: ?

## 2020-02-16 ENCOUNTER — Encounter: Admit: 2020-02-16 | Discharge: 2020-02-16 | Payer: MEDICARE

## 2020-02-16 ENCOUNTER — Ambulatory Visit: Admit: 2020-02-16 | Discharge: 2020-02-16 | Payer: MEDICARE

## 2020-02-16 DIAGNOSIS — I5032 Chronic diastolic (congestive) heart failure: Secondary | ICD-10-CM

## 2020-02-20 ENCOUNTER — Encounter: Admit: 2020-02-20 | Discharge: 2020-02-20 | Payer: MEDICARE

## 2020-02-20 ENCOUNTER — Ambulatory Visit: Admit: 2020-02-20 | Discharge: 2020-02-20 | Payer: MEDICARE

## 2020-02-20 DIAGNOSIS — C4490 Unspecified malignant neoplasm of skin, unspecified: Secondary | ICD-10-CM

## 2020-02-20 DIAGNOSIS — Z87448 Personal history of other diseases of urinary system: Secondary | ICD-10-CM

## 2020-02-20 DIAGNOSIS — I10 Essential (primary) hypertension: Secondary | ICD-10-CM

## 2020-02-20 DIAGNOSIS — I358 Other nonrheumatic aortic valve disorders: Secondary | ICD-10-CM

## 2020-02-20 DIAGNOSIS — J302 Other seasonal allergic rhinitis: Secondary | ICD-10-CM

## 2020-02-20 DIAGNOSIS — E785 Hyperlipidemia, unspecified: Secondary | ICD-10-CM

## 2020-02-20 LAB — URINALYSIS DIPSTICK REFLEX TO CULTURE
Lab: 1 U/L (ref 1.005–1.030)
Lab: 7 MMOL/L (ref 5.0–8.0)
Lab: NEGATIVE
Lab: NEGATIVE
Lab: NEGATIVE
Lab: NEGATIVE K/UL (ref 0–0.20)

## 2020-02-20 LAB — URINALYSIS MICROSCOPIC REFLEX TO CULTURE

## 2020-02-20 NOTE — Patient Instructions
What is Hematuria?  During routine visits to your health care provider, you are often asked to give a urine sample for testing. Many tests are done routinely, like checking for sugar (diabetes), bacteria (infection) and blood. Blood in the urine that you do not see is called microscopic hematuria. This blood is only visible under a microscope. There are many causes and most are not serious, but may call for care by your health care provider.    What are the Causes of Hematuria?  1. Urinary infection  2. Enlarged Prostate in older men  3. Kidney or bladder stones  4. Period in women  5. Prostate infection  6. Kidney disease  7. Kidney trauma  8. Bladder cancer (mostly in smokers)  9. Kidney cancer  10. Blood thinning drugs (aspirin, coumadin/warfarin)  11. Anti-swelling drugs (joint swelling and pain pills)  12. Tough workout    When blood is found in the urine, health care providers want to make sure there is not a serious health issue involved such as a tumor in the kidney or bladder. Urological cancers are rarely the cause of blood in the urine. Only about 2 or 3 of every 100 people with microscopic hematuria are found to have cancer.    When you actually see blood in the urine, it is called gross hematuria. This is much more likely to be tied to a cancer or other health issue that needs medical care.          Male Urinary Tract    Male Urinary Tract  Medical Illustration Copyright ? 2015    Medical Illustration Copyright ? 2015  [employer], All rights reserved   [employer], All rights reserved        How is Hematuria Evaluated?    Repeat Urine Testing  The next step when microscopic blood is found is to repeat your urine test. This checks that the first finding was correct. You will need to get a mid-stream sample of urine. You will be told to collect your sample only after you have started to pass urine. Uncircumcised men will need to retract their foreskin to get a proper sample. Women will need to spread their labia and clean the opening of the urethra to get a clean sample.  Your health care provider will ask you questions about your health history. They will want to know about any infection, menstruation (period), kidney stones, drugs, or recent kidney injury. Simply repeating the urine test might be all that is needed if no further blood is found.      If Testing Shows Blood and Protein  If protein is also found in the urine along with blood, then more urine tests and blood tests are needed. These tests are done to find out if kidney disease is the cause. Sometimes a kidney biopsy (putting a needle into the kidney) is done to find out if drugs are needed. Many of these health issues do not need treatment.  If Testing Shows Blood but No Protein  If a repeat urine test still shows blood and no protein is found, the next step would involve:  ? blood test for kidney function  ? cystoscopy (a procedure to look inside the bladder)  ? imaging test such as CT scan, MRI or ultrasound to look at the inner and outer parts of the kidney, ureters and bladder.          Cystoscopy  ? [address] [place], [address] [place] has  certain rights    Most often no specific cause for blood in the urine is found. You will be asked to do another urine test 1 and 2 years later. If no further blood is found, then no further testing is needed. If you still have blood in your urine, these tests will likely be repeated.    What Increases the Risk of Finding Cancer?  Cancers are not often the cause of microscopic blood in the urine. But there are many health issues that increase the chance that a bladder or kidney tumor is the cause. Seeing blood yourself in the urine (gross hematuria) is the most worrisome. A history of smoking or current smoking will increase your health care provider's concern about finding cancer.    Issues that Increase the Chance of Cancer  1. Age over 35 years  2. Prior visible gross blood in the urine  3. Cigarette smoking (past or current)  4. Chemicals in the workplace  5. Prior pelvic radiation for cancer  6. Prior urological disorder or disease  7. Irritative voiding symptoms (pain, infection)  8. Chronic urinary tract infection      What If I Have Blood in my Urine?  If you are told that you have microscopic hematuria, do not panic because most causes are not life threatening. Make sure that you get your urine test repeated right away. Your health care provider will then tell you whether you need to have further blood tests, urine tests or imaging tests such as x-rays and cystoscopy. Make sure to follow what your health care provider tells you to do, so that if there is a serious condition, it can be treated right away.    Additional Information & Videos  [email or web address]

## 2020-02-23 ENCOUNTER — Encounter: Admit: 2020-02-23 | Discharge: 2020-02-23 | Payer: MEDICARE

## 2020-02-23 DIAGNOSIS — I10 Essential (primary) hypertension: Secondary | ICD-10-CM

## 2020-02-23 DIAGNOSIS — J302 Other seasonal allergic rhinitis: Secondary | ICD-10-CM

## 2020-02-23 DIAGNOSIS — C4490 Unspecified malignant neoplasm of skin, unspecified: Secondary | ICD-10-CM

## 2020-02-23 DIAGNOSIS — E785 Hyperlipidemia, unspecified: Secondary | ICD-10-CM

## 2020-02-23 DIAGNOSIS — I358 Other nonrheumatic aortic valve disorders: Secondary | ICD-10-CM

## 2020-02-25 ENCOUNTER — Encounter: Admit: 2020-02-25 | Discharge: 2020-02-25 | Payer: MEDICARE

## 2020-02-28 ENCOUNTER — Encounter: Admit: 2020-02-28 | Discharge: 2020-02-28 | Payer: MEDICARE

## 2020-02-28 NOTE — Telephone Encounter
PADs above thresholds on recent readings. Reviewed with Ray Abraham, APRN: Patient to take extra dose of metolazone. Patient to see Dr. [name] or Ray [name] at [facility] office at next available appointment.     DAILY CardioMEMS READINGS    Goal PA Diastolic: 22 mmHg  PA Diastolic Thresholds: 20-24 mmHg          CARDIAC MEDS:    Home Medications    Medication Sig   amiodarone (CORDARONE) 200 mg tablet Take one tablet by mouth daily.   bumetanide (BUMEX) 2 mg tablet Take two tablets by mouth twice daily.   cholecalciferol (vitamin D3) (VITAMIN D3 PO) Take 1 tablet by mouth daily. Pt. Unsure of dosage   docusate (COLACE) 100 mg capsule Take 100 mg by mouth daily.   esomeprazole DR(+) (NEXIUM) 20 mg capsule Take 20 mg by mouth daily. Take on an empty stomach at least 1 hour before or 2 hours after food.   ferrous sulfate (FEOSOL) 325 mg (65 mg iron) tablet Take 325 mg by mouth twice daily. Take on an empty stomach at least 1 hour before or 2 hours after food.    fluticasone/salmeterol (ADVAIR) 100/50 mcg inhalation disk Inhale 1 puff by mouth into the lungs daily.   lactobacillus rhamnosus (GG) (CULTURELLE) 10 billion cell cap Take 1 capsule by mouth daily with breakfast.   magnesium oxide (MAG-OX) 400 mg (241.3 mg magnesium) tablet Take one tablet by mouth daily.   metOLazone (ZAROXOLYN) 2.5 mg tablet Take one tablet by mouth every 7 days.   polyethylene glycol 3350 (MIRALAX) 17 g packet Take one packet by mouth daily.   potassium chloride SR (K-DUR) 20 mEq tablet Take two tablets by mouth daily. Take with a meal and a full glass of water. Take two tablets twice per day on 9/18, then resume daily dosing.  Patient taking differently: Take 40 mEq by mouth daily.   rivaroxaban (XARELTO) 15 mg tablet Take one tablet by mouth daily with breakfast. Take with food. Hold the day before and the morning of your CardioMEMS procedure.   rosuvastatin (CRESTOR) 5 mg tablet Take one tablet by mouth three times weekly. senna/docusate (SENOKOT-S) 8.6/50 mg tablet Take one tablet by mouth as Needed.   sildenafiL (VIAGRA) 100 mg tablet TAKE 1 TABLET BY MOUTH ONCE DAILY AS NEEDED FOR SEXUAL ACTIVITY   tafamidis (VYNDAMAX) 61 mg capsule Take one capsule by mouth daily.   VENTOLIN HFA 90 mcg/actuation inhaler Inhale 1 puff by mouth into the lungs as Needed.       RECENT LABS:    Basic Metabolic Profile    Lab Results   Component Value Date/Time    NA 137 12/20/2019 02:37 PM    K 4.1 12/20/2019 02:37 PM    CA 9.7 12/20/2019 02:37 PM    CL 92 (L) 12/20/2019 02:37 PM    CO2 37 (H) 12/20/2019 02:37 PM    GAP 8 12/20/2019 02:37 PM    Lab Results   Component Value Date/Time    BUN 57 (H) 12/20/2019 02:37 PM    CR 2.08 (H) 12/20/2019 02:37 PM    GLU 67 (L) 12/20/2019 02:37 PM          UPCOMING APPOINTMENTS:    Future Appointments   Date Time Provider Department Center   03/19/2020 12:00 AM [name] Proced[facility]   04/19/2020 12:00 AM [name] Proced[facility]   05/21/2020 12:00 AM [name] Proced[facility]   06/19/2020  1:00 PM [name], MD [facility]   06/19/2020  2:00 PM [name], MD [facility] IM   06/21/2020 12:00 AM [name] Proced[facility]   07/23/2020 12:00 AM [name] Proced[facility]   08/23/2020 12:00 AM [name] Proced[facility]   09/24/2020 12:00 AM [name] Proced[facility]   10/25/2020 12:00 AM [name] Proced[facility]   11/26/2020 12:00 AM [name] Proced[facility]   12/27/2020 12:00 AM [name] Proced[facility]   01/28/2021 12:00 AM [name] Procedur

## 2020-03-04 ENCOUNTER — Encounter: Admit: 2020-03-04 | Discharge: 2020-03-04 | Payer: MEDICARE

## 2020-03-07 ENCOUNTER — Encounter: Admit: 2020-03-07 | Discharge: 2020-03-07 | Payer: MEDICARE

## 2020-03-07 NOTE — Progress Notes
Amiodarone Follow-up:   Amiodarone Monitoring status as of 03/07/20:     Most recent lab results  Lab Results   Component Value Date/Time    AST 24 12/15/2019 01:50 PM    ALT 14 12/15/2019 01:50 PM    TSH 2.53 09/21/2019 12:04 PM    FREET4R 1.08 05/28/2015 12:00 AM     Patient still needs TSH+Free4. Labs ordered and patient aware.     Procedures  Last chest X-Ray: 03/06/2020  Last PFT: 10/17/2019  Last eye exam: Unknown

## 2020-03-12 ENCOUNTER — Encounter: Admit: 2020-03-12 | Discharge: 2020-03-12 | Payer: MEDICARE

## 2020-03-12 DIAGNOSIS — C4490 Unspecified malignant neoplasm of skin, unspecified: Secondary | ICD-10-CM

## 2020-03-12 DIAGNOSIS — J302 Other seasonal allergic rhinitis: Secondary | ICD-10-CM

## 2020-03-12 DIAGNOSIS — I358 Other nonrheumatic aortic valve disorders: Secondary | ICD-10-CM

## 2020-03-12 DIAGNOSIS — E785 Hyperlipidemia, unspecified: Secondary | ICD-10-CM

## 2020-03-12 DIAGNOSIS — I10 Essential (primary) hypertension: Principal | ICD-10-CM

## 2020-03-14 ENCOUNTER — Encounter: Admit: 2020-03-14 | Discharge: 2020-03-14 | Payer: MEDICARE

## 2020-03-19 ENCOUNTER — Ambulatory Visit: Admit: 2020-03-19 | Discharge: 2020-03-19 | Payer: MEDICARE

## 2020-03-19 DIAGNOSIS — I5032 Chronic diastolic (congestive) heart failure: Secondary | ICD-10-CM

## 2020-03-27 ENCOUNTER — Ambulatory Visit: Admit: 2020-03-27 | Discharge: 2020-03-28 | Payer: MEDICARE

## 2020-03-27 ENCOUNTER — Encounter: Admit: 2020-03-27 | Discharge: 2020-03-27 | Payer: MEDICARE

## 2020-03-27 DIAGNOSIS — C4490 Unspecified malignant neoplasm of skin, unspecified: Secondary | ICD-10-CM

## 2020-03-27 DIAGNOSIS — I358 Other nonrheumatic aortic valve disorders: Secondary | ICD-10-CM

## 2020-03-27 DIAGNOSIS — D489 Neoplasm of uncertain behavior, unspecified: Secondary | ICD-10-CM

## 2020-03-27 DIAGNOSIS — E785 Hyperlipidemia, unspecified: Secondary | ICD-10-CM

## 2020-03-27 DIAGNOSIS — I10 Essential (primary) hypertension: Secondary | ICD-10-CM

## 2020-03-27 DIAGNOSIS — J302 Other seasonal allergic rhinitis: Secondary | ICD-10-CM

## 2020-03-27 DIAGNOSIS — Z85828 Personal history of other malignant neoplasm of skin: Secondary | ICD-10-CM

## 2020-03-27 NOTE — Procedures
Shave Biopsy Procedure Note    Risk and benefits of the above procedure including bleeding, pain, dyspigmentation, scar, infection, recurrence or nerve damage with loss of muscle function and/or skin sensation were discussed with the patient (or legal guardian) in detail, who afterwards decided to proceed with the procedure.    Diagnosis: Neoplasm Uncertain Behavio  Body Site:    NUO A: R Preauricular Cheek Superior   NUO B: R Preauricular Cheek Inferior    NUO C: Mid Central Forehead   NUO D: Nasal Tip   NUO E: R Malar Cheek  Preparation:  Alcohol   Anesthesia:  1% lidocaine with epinephrine  Instrument:  Dermablade  Hemostasis:  Electrocautery   Closure:  None  Wound dressing:  Vaseline  Wound care instructions given:  Verbal  Complications:  None  Tolerated well: Yes  Ambulated from room:  Yes  Pathology sent to:  [facility] Pathology  Duration of procedure:  >5 minutes    Procedure Time Out Check List:  Prior to the start of the procedure, I personally confirmed the following:    Site Marking Verified: Yes, as appropriate  Patient Identity (name & date of birth): Yes  Procedure: Yes  Site: Yes  Body Part: see above    The risks of the procedure, including infection, bleeding, pain and skin changes, were discussed with the patient.

## 2020-03-27 NOTE — Progress Notes
Date of Service: 03/27/2020    Subjective:             Ray [name] is a 84 y.o. male.    History of Present Illness  New pt. referred by Dr. Merla Riches    #Non-Healing Sores on Face  - Present for 2-3 years  - Reports several lesions on his face have been growing and bleeding intermittently  - Reports the lesions are painful  - Reports continues to grow, has tried to use Efudex to one by his R ear without improvement  - Was initially evaluated by Dr Yong Channel who recommended referral to dermatology   - Pt's last TBSE was over 1.5 years ago     #Hx of BCCs  - Reports several BCCs on his face that were treated with excision by plastic surgery, Dr Jimmye Norman   - [facility] s/p Excision by Dr Larina Bras    #Brown spots on the head, trunk, arms and legs.   - + H/o blistering sunburns. +tanning bed usage.  - None are changing, growing, itching, or bleeding     Personal Hx: BCCs, cardiac amyloidosis   Family Hx: siblings with NMSC  Social Hx: lineman, retired       Review of Systems   Constitutional: Negative for appetite change, diaphoresis, fatigue, fever and unexpected weight change.   HENT: Negative for congestion, mouth sores and sore throat.    Eyes: Negative for pain, redness, itching and visual disturbance.   Respiratory: Negative for cough and shortness of breath.    Cardiovascular: Negative for palpitations and leg swelling.   Gastrointestinal: Negative for abdominal pain, blood in stool, diarrhea, nausea and vomiting.   Genitourinary: Negative for difficulty urinating and hematuria.   Musculoskeletal: Negative for arthralgias and myalgias.   Neurological: Negative for dizziness, seizures and facial asymmetry.   Hematological: Does not bruise/bleed easily.   Psychiatric/Behavioral: Negative for confusion and dysphoric mood. The patient is not nervous/anxious.      Objective:         ? amiodarone (CORDARONE) 200 mg tablet Take one tablet by mouth daily.   ? bumetanide (BUMEX) 2 mg tablet Take two tablets by mouth twice daily.   ? cholecalciferol (vitamin D3) (VITAMIN D3 PO) Take 1 tablet by mouth daily. Pt. Unsure of dosage   ? docusate (COLACE) 100 mg capsule Take 100 mg by mouth daily.   ? esomeprazole DR(+) (NEXIUM) 20 mg capsule Take 20 mg by mouth daily. Take on an empty stomach at least 1 hour before or 2 hours after food.   ? ferrous sulfate (FEOSOL) 325 mg (65 mg iron) tablet Take 325 mg by mouth twice daily. Take on an empty stomach at least 1 hour before or 2 hours after food.    ? fluticasone/salmeterol (ADVAIR) 100/50 mcg inhalation disk Inhale 1 puff by mouth into the lungs daily.   ? lactobacillus rhamnosus (GG) (CULTURELLE) 10 billion cell cap Take 1 capsule by mouth daily with breakfast.   ? magnesium oxide (MAG-OX) 400 mg (241.3 mg magnesium) tablet Take one tablet by mouth daily.   ? metOLazone (ZAROXOLYN) 2.5 mg tablet Take one tablet by mouth every 7 days.   ? polyethylene glycol 3350 (MIRALAX) 17 g packet Take one packet by mouth daily.   ? potassium chloride SR (K-DUR) 20 mEq tablet Take two tablets by mouth daily. Take with a meal and a full glass of water. Take two tablets twice per day on 9/18, then resume daily  dosing. (Patient taking differently: Take 40 mEq by mouth daily.)   ? rivaroxaban (XARELTO) 15 mg tablet Take one tablet by mouth daily with breakfast. Take with food. Hold the day before and the morning of your CardioMEMS procedure.   ? rosuvastatin (CRESTOR) 5 mg tablet Take one tablet by mouth three times weekly.   ? senna/docusate (SENOKOT-S) 8.6/50 mg tablet Take one tablet by mouth as Needed.   ? sildenafiL (VIAGRA) 100 mg tablet TAKE 1 TABLET BY MOUTH ONCE DAILY AS NEEDED FOR SEXUAL ACTIVITY   ? tafamidis (VYNDAMAX) 61 mg capsule Take one capsule by mouth daily.   ? VENTOLIN HFA 90 mcg/actuation inhaler Inhale 1 puff by mouth into the lungs as Needed.     Vitals:    03/27/20 1342   Weight: 65.4 kg (144 lb 3.2 oz)   Height: 162.6 cm (64)   PainSc: Zero     Body mass index is 24.75 kg/m?[name]     Physical Exam  Areas Examined (all normal unless noted below):  Head/Face  Neck    Pt declines FBSE today.     Pertinent findings include:  General: Alert and Oriented x 3, Well-nourished  Eyes: Normal Conjunctivae, EOMI  Psych: normal mood    NUO A: R Preauricular Cheek Superior w/ ulcerated crusted plaque rolled borders  NUO B: R Preauricular Cheek Inferior w/ ulcerated crusted plaque rolled borders  NUO C: Mid Central Forehead w/ ulcerated red papule  NUO D: Nasal Tip w/ ulcerated crusted papule   NUO E: R Malar Cheek w/ red plaque w/ overlying superficial ulcerations     Several ulcerated and crusted papules and plaques scattered on face including R angle of jaw, R chin, L cheek        Assessment and Plan:  #NUO A-E  - Shave biopsy today  - A-E DDx: BCC  - Photo was taken with pt consent  - Discussed risks of bleeding, scar, infection with biopsy  - Patient given verbal and written biopsy site care instructions  - Will contact patient with biopsy results    #NUOs on Face  - Several lesions concerning for BCCs vs SCCs  - Discussed with patient that we will biopsy the 5 most bothersome/largest lesions today as above and then we will have him return in a few months for TBSE and additional biopsies, pt agreeable    RTC 8-12 weeks    Shirlee Limerick, MD  PGY-3 Dermatology

## 2020-03-28 DIAGNOSIS — C4490 Unspecified malignant neoplasm of skin, unspecified: Secondary | ICD-10-CM

## 2020-03-28 NOTE — Progress Notes
ATTESTATION    I personally performed the key portions of the E/M visit, discussed case with resident and concur with resident documentation of history, physical exam, assessment, and treatment plan unless otherwise noted. I performed the key components of the tangential (shave) biopsy including site identification, discussion with patient, biopsy type and choice, and was present during the procedure.    Staff name:  Mickel Duhamel, MD Date:  03/27/2020

## 2020-03-29 ENCOUNTER — Encounter: Admit: 2020-03-29 | Discharge: 2020-03-29 | Payer: MEDICARE

## 2020-03-29 DIAGNOSIS — C4441 Basal cell carcinoma of skin of scalp and neck: Secondary | ICD-10-CM

## 2020-04-02 ENCOUNTER — Encounter: Admit: 2020-04-02 | Discharge: 2020-04-02 | Payer: MEDICARE

## 2020-04-02 MED FILL — TAFAMIDIS 61 MG PO CAP: 61 mg | ORAL | 30 days supply | Qty: 30 | Fill #1 | Status: AC

## 2020-04-02 NOTE — Progress Notes
Copay assistance of $8000 was obtained for the specialty medication Vyndamax using grant from [facility] and now the copay is $0.  Ray [name] has stated this copay is affordable.  The specialty pharmacy will pursue additional copay assistance as necessary.  The specialty pharmacy will reach out to the ambulatory clinical pharmacist and provider's nurse if the copay becomes unaffordable.    The medication will be delivered to patient's prescription address per the patient's request.    The Prior Authorization for Vyndamax has been submitted for Ray [name] via Cover My Meds.  Will continue to follow.    The Prior Authorization for Vyndamax was approved for Ray [name] from 04/02/20 to 03/12/21.  The copay is $3468.54.  The PA authorization number is [id number].      [place] Patient Advocate  [phone]

## 2020-04-05 NOTE — Progress Notes
Date of Service: 04/06/2020    Ray [name] is a 85 y.o. male.       HPI      I had the pleasure of seeing Ray [name], who is accompanied by his wife Ray [name] today in [place] [facility] clinic for wild-type amyloidosis follow-up.  I had last seen him in clinic in September.    He has medical history that is significant for wild-type amyloidosis, Cardiomems in situ, atrial fibrillation status post ablation, basal cell skin cancer and CKD stage III.      Today he reports that he is feeling well, he  is occasionally off balance when he changes positions too quickly.  Home blood pressures have been stable at 102/62 occasional diastolic blood pressures in the 50s..  Of note he had dermatology procedure in clinic on 12/28 and had multiple shave biopsies, he reports that after the procedure he had quite a bit of bleeding with that and his wife had to apply powder blood stop.  He tells me that they are planning on Mohs procedure in April.  He denies any chest pain, orthopnea, cough or PND.  He has slight peripheral edema.       Vitals:    04/06/20 1036   BP: 108/70   BP Source: Arm, Left Upper   Patient Position: Sitting   Pulse: 58   SpO2: 97%   Weight: 67.1 kg (148 lb)   Height: 1.626 m (5' 4)   PainSc: Zero     Body mass index is 25.4 kg/m?[name]     Past Medical History  Patient Active Problem List    Diagnosis Date Noted   ? 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  ?  01/26/2019 Echo EF:?60%  IVS 1.50 cm (Range: 0.6 - 1.0)         LV PW 1.40 cm (Range: 0.6 - 1.0)         ?  1. Moderate concentric left ventricular hypertrophy  2. Unable to assess diastolic function due to atrial fibrillation  3. Moderate biatrial dilatation  4. Normal right ventricular size and systolic function  5. Normal central venous pressure  6. Mild calcific aortic valve stenosis. ?Mean gradient is 16 mmHg with a peak velocity of 2.8 m/s. ?Trace aortic valve regurgitation is seen  7. Mitral valve is structurally okay. ?There is moderate central regurgitation. ?No stenosis  8. Moderate tricuspid valve regurgitation  9. Pulmonary artery static pressure measures at 33 mmHg   06/28/2019 TC PYP Multiview planar views and SPECT imaging confirms the presence of severe marked diffuse global left ventricular and right ventricular myocardial uptake of tracer. ?There is normal rib and bone uptake.  ?  Two hour heart/contralateral lung (H/CL) ratio: ?1.977?this metric is abnormal and highly suggestive of ATTR cardiac amyloidosis.?  ?  Visual Semi-Quantitative Grading Scale Analysis:?  The study is grade 3 indicating myocardial uptake greater than rib and bone uptake. ?The pattern is highly and strongly suggestive of ATTR cardiac amyloidosis.  ?  SUMMARY/OPINION:   This study is abnormal and strongly suggestive of ATTR cardiac amyloidosis. ?Left ventricular myocardial uptake is diffuse, intense, and global. ?There is also prominent right ventricular free wall uptake of tracer. ?The pattern is typical for ATTR cardiac amyloidosis   ? Cardiac MRI ?   07/25/2019 Kappa/Lambda FLC ?  ? Ref. Range 07/25/2019 15:48   Kappa, FLC Latest Ref Range: 0.33 - 1.94  MG/DL 1.61 (H)   Lambda, FLC Latest Ref Range: 0.57 - 2.63 MG/DL 0.96 (H)   Kappa/Lambda FLC Latest Ref Range: 0.26 - 1.65  1.83 (H)      07/25/2019 Serum Immunofixation ?NO PARAPROTEIN SEEN   cancelled Urine Immunofixation ?Patient unable to provide urine sample 4/26    07/25/2019 Genetic Testing ?Negative   ? Biopsy (Fat Pad, Cardiac, Bone Marrow) ?   ? GI Symptoms ?   ?+? 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   ?  ? Ref. Range 07/25/2019 15:48   B Type Natriuretic Peptide Latest Ref Range: 0 - 100 PG/ML 966.0 (H)   Troponin-I Latest Ref Range: 0.0 - 0.05 NG/ML 0.05   ?  Completed:  -Tafamidis start 08/2019. PA approved, pt approved for [employer] patient assistance from 09/01/2019 to 03/30/2020  -Baseline KCCQ12 completed 5/18/202, Summary Score 61.46  -Baseline 6 min walk completed 08/16/2019, distance walked 950 feet, 289.75 meters, duration of test 6 minutes  -Baseline CPET completed 10/17/2019       ? Asymptomatic microscopic hematuria 12/25/2019   ? S/P left pulmonary artery pressure sensor implant placement 12/15/2019     *Patient has CardioMEMS (Pulmonary Artery Pressure Sensor)* Please call [name], CardioMEMs Program Coordinator [phone] or page Heart Failure Rounding Team if patient is admitted or presents to ED*          ? Chronic heart failure with preserved ejection fraction (HCC) 12/15/2019   ? Acute on chronic heart failure with preserved ejection fraction (HFpEF) (HCC) 11/14/2019   ? S/P ablation of atrial fibrillation 05/18/2018   ? Lightheadedness 03/16/2018   ? Hospitalization within last 30 days 12/24/2017   ? Atrial fibrillation with RVR (HCC) 12/14/2017   ? Stage 3b chronic kidney disease (HCC) 12/13/2017   ? Cardiogenic shock (HCC) 12/12/2017   ? Bradycardia 12/12/2017   ? Bilateral leg edema 12/11/2017   ? On amiodarone therapy 12/11/2017   ? Heart failure with preserved ejection fraction (HCC) 01/01/2016   ? NSVT (nonsustained ventricular tachycardia) (HCC) 06/12/2015   ? Medication side effects 11/09/2014   ? Heart palpitations 09/19/2014   ? Longstanding persistent atrial fibrillation (HCC) 03/17/2013     03/26/2018 - ECHO:  Moderate concentric LVH seen. The EF is about 55%.  The atria are slightly dilated.  The RV function is mildly decreased.  Mild to moderate MR and TR seen.  Mild AI is present.  Based on the gradients and appearance, the AS appears to be mild. The mean gradient is 16 mm Hg with a peak velocity of 2.6 m/s.  03/26/2018 - Zio Patch:  Predominant rhythm was normal sinus. Two runs of ventricular tachycardia occurred, they were monomorphic, the longest run lasted 3 seconds, this episode occurred on April 08, 2018 at 9:41 PM.  One episode of AIVR also appeared to be present, it lasted approximately 4.6 seconds  Brief and rare episodes of SVT that probably represent atrial fibrillation, the longest lasting 6.3 seconds.  No evidence of atrial flutter and no evidence of high degree AV block.  05/12/2018 - LAAA:  Persistent Atrial Fibrillation status post successful pulmonary vein antral isolation.  Cavotricuspid Isthmus Ablation.  Ablation of Fractionated Intracardiac Electrograms.  Ablation for an Additional Discrete Arrhythmia-LA AFL after AFIB/PVI Ablation  06/23/2018 - Cardioversion:  Successful direct current cardioversion  from symptomatic atrial fibrillation to sinus rhythm.  03/16/2019 - DCCV:  Successful direct current cardioversion from atrial fibrillation to sinus rhythm.     ? Systolic murmur of aorta 02/04/2013   ? SOB (shortness of breath) 02/04/2013   ? Carotid bruit present 01/23/2012   ? Aortic stenosis, mild 01/30/2011   ? Overweight (BMI 25.0-29.9) 01/30/2011   ? Essential hypertension 01/30/2009   ? Hyperlipidemia 01/30/2009     Hyperlipidemia, on statin therapy     ? Skin cancer 01/30/2009   ? Seasonal allergic reaction 01/30/2009         Review of Systems   Constitutional: Negative.   HENT: Negative.    Eyes: Negative.    Cardiovascular: Negative.    Respiratory: Negative.    Endocrine: Negative.    Hematologic/Lymphatic: Negative.    Skin: Negative.    Musculoskeletal: Negative.    Gastrointestinal: Positive for constipation.   Genitourinary: Negative.    Neurological: Negative.    Psychiatric/Behavioral: Negative.    Allergic/Immunologic: Negative.        Physical Exam  General Appearance: In NAD  Neck Veins: 9 cm JVP, neck veins are distended, + HJR   Chest Inspection: chest is normal in appearance   Respiratory Effort: breathing comfortably, no respiratory distress   Auscultation/Percussion: lungs clear to auscultation, no rales, rhonchi or wheezing   Cardiac Rhythm: irregularly irregular rhythm and normal rate   Cardiac Auscultation: S1, S2 normal, no rub, no definite S3  or S4   Murmurs: grade iii/vi systolic murmur   Peripheral Circulation: normal peripheral circulation   Pedal Pulses: normal symmetric pedal pulses   Lower Extremity Edema: Trace right ankle, bilateral lower legs have venous discoloration  Abdominal Exam: soft, non-tender, no obvious masses, bowel sounds normal   Gait & Station: walks without assistance   Orientation: oriented to person, place and time   Affect & Mood: appropriate and sustained affect   Language and Memory: patient responsive and seems to comprehend information   Neurologic Exam: neurological assessment grossly intact   Vital signs were reviewed  Lower back incision dressing removed and replaced, moh's procedure tunneled    Cardiovascular Studies: echo 09/06/2019  ? The underlying rhythm is atrial fibrillation.  ? Normal left ventricular systolic function, estimated ejection fraction is 55%.  ? Severe concentric LVH, patient has known amyloidosis.  ? Probably severe diastolic dysfunction, elevated filling pressure.  ? The right ventricle is dilated, hypertrophy of the free wall, decreased systolic function.M-Mode TAPSE 1.2 cm (normal >1.7 cm).  ? Biatrial dilatation (LA?mildly dilated, RA?moderately dilated).  ? Moderate mitral valve regurgitation, moderate to severe tricuspid valve regurgitation.  ? Severely sclerotic aortic valve, by spectral Doppler calculations mild stenosis (MG= 12 mmHg, peak velocity= 2.3 m/sec, DI= 0.27 , AVA= 1.1 cm?), trace regurgitation. By 2D echo exam only, the aortic valve systolic opening is significantly restricted, this finding is compatible with severe aortic valve stenosis. (NF-LG severe AS).  ? Estimated Peak Systolic PA Pressure 31 mmHg      6 Minute Walk Last Results 08/16/2019   SpO2: Pre-Activity 99   Pulse:  POST-Activity 122   BP:  POST-Activity 151/76   SpO2:  POST-Activity 96   Distance Walked in [employer] 289.75       Problems Addressed Today  Encounter Diagnoses   Name Primary?   ? Aortic stenosis, mild Yes       Assessment and Plan   Wild-type amyloidosis:   -He has had a previous technetium pyrophosphate scan  on 06/28/2019 that showed a 2-hour heart/contralateral lung ratio of 1.97 with visual semiquantitative grading grade 3, abnormal and highly suggestive of ATTR cardiac amyloidosis.   - He has had kappa 7.45, lambda 4.07 and free light chain ratio 1.83 on 4/26.    -He had serum electrophoresis that showed no paraprotein.   - He did a 6-minute walk test on 5/18, he was able to walk 289.75 m.    -He has completed a [facility] Q score that was 72.92 indicative of fair to good quality of life.    -He is on tafamidis 61 mg daily.    -He had a baseline CPET on 7/19.    -We will repeat technetium pyrophosphate scan in March.  -Follow-up appointment with Dr. [name] in late March/early April prior to Mohs procedure       Heart failure with preserved ejection fraction:  - He appears euvolemic on physical exam on  Bumex 4 mg p.o. twice daily and he also takes metolazone 2.5 mg twice weekly..     CardioMEMS in situ:   -12/15/19 IMPLANT RHC:?  BP:?126/76 (99)  RA:?27  RV:?60/32  PA:?57/31 (42)  PCWP:?30  TPG:?12  PVR:?3.8  SVR:?1840  CO (Fick):?3.1  CI (Fick):?1.7?  AVO2 Diff 7.18?  Daily CardioMEMS Readings  ?Goal PA Diastolic:  22  PA Diastolic Thresholds:  20-24  His PA diastolic today is 27, above target.  He is scheduled to take metolazone today.  He was instructed to take an extra potassium when he takes metolazone.    Aortic stenosis: The most recent echocardiogram on 6/8 showed severely sclerotic aortic valve by spectral Doppler calculation mild stenosis with mean gradient of 12 mmHg, the AVA is 1.1 cm.     CKD: His creatinine on 9/21 was 2.08.  He is scheduled to see Dr. Mercie Eon on 3/22.   Atrial fibrillation: He continues to take amiodarone 200 mg p.o.daily, he is on oral anticoagulation with Xarelto 15 mg daily, he denies any upper or lower GI bleeding symptoms.  He has been in atrial fibrillation since at least 2020, has had previous ablations.  -I will check with Dr. [name] to see if he is a candidate for watchman procedure as he continues to have frequent biopsies done for his basal skin cancer.     have personally documented the HPI, exam and medical decision making.  Patient education: I reviewed recent lab results and current medications, medication instructions, discussed heart failure signs & symptoms,  low  sodium diet, fluid restriction and daily weights.I have instructed the patient on the plan of care and they verbalize understanding of the plan. Please see AVS for full patient teaching. Patient advised to call our office if s/he has any problems, questions, worsening symptoms, or concerns prior to the next appointment.   Thanks for allowing me to see this nice patient. If I can be of additional assistance, please don't hesitate to contact me.     [name] AGPCNP-BC, [facility]  Pager [phone]  Collaborating physician: Dr. Vanetta Shawl              Encounter Diagnoses   Name Primary?   ? Wild-type transthyretin-related (ATTR) amyloidosis (HCC) Yes                       Current Medications (including today's revisions)  ? amiodarone (CORDARONE) 200 mg tablet Take one tablet by mouth daily.   ? bumetanide (BUMEX) 2 mg tablet Take two tablets by mouth  twice daily.   ? cholecalciferol (vitamin D3) (VITAMIN D3 PO) Take 1 tablet by mouth daily. Pt. Unsure of dosage   ? docusate (COLACE) 100 mg capsule Take 100 mg by mouth daily.   ? esomeprazole DR(+) (NEXIUM) 20 mg capsule Take 20 mg by mouth daily. Take on an empty stomach at least 1 hour before or 2 hours after food.   ? ferrous sulfate (FEOSOL) 325 mg (65 mg iron) tablet Take 325 mg by mouth twice daily. Take on an empty stomach at least 1 hour before or 2 hours after food.    ? fluticasone/salmeterol (ADVAIR) 100/50 mcg inhalation disk Inhale 1 puff by mouth into the lungs daily.   ? lactobacillus rhamnosus (GG) (CULTURELLE) 10 billion cell cap Take 1 capsule by mouth daily with breakfast.   ? magnesium oxide (MAG-OX) 400 mg (241.3 mg magnesium) tablet Take one tablet by mouth daily.   ? [START ON 04/09/2020] metOLazone (ZAROXOLYN) 2.5 mg tablet Take one tablet by mouth twice weekly. (Tuesdays and Fridays)   ? polyethylene glycol 3350 (MIRALAX) 17 g packet Take one packet by mouth daily.   ? potassium chloride SR (K-DUR) 20 mEq tablet Take two tablets by mouth daily. Take with a meal and a full glass of water. Take two tablets twice per day on 9/18, then resume daily dosing. (Patient taking differently: Take 40 mEq by mouth daily.)   ? rivaroxaban (XARELTO) 15 mg tablet Take one tablet by mouth daily with breakfast. Take with food. Hold the day before and the morning of your CardioMEMS procedure.   ? rosuvastatin (CRESTOR) 5 mg tablet Take one tablet by mouth three times weekly.   ? senna/docusate (SENOKOT-S) 8.6/50 mg tablet Take one tablet by mouth as Needed.   ? sildenafiL (VIAGRA) 100 mg tablet TAKE 1 TABLET BY MOUTH ONCE DAILY AS NEEDED FOR SEXUAL ACTIVITY   ? tafamidis (VYNDAMAX) 61 mg capsule Take one capsule by mouth daily.   ? VENTOLIN HFA 90 mcg/actuation inhaler Inhale 1 puff by mouth into the lungs as Needed.

## 2020-04-06 ENCOUNTER — Ambulatory Visit: Admit: 2020-04-06 | Discharge: 2020-04-06 | Payer: MEDICARE

## 2020-04-06 ENCOUNTER — Encounter: Admit: 2020-04-06 | Discharge: 2020-04-06 | Payer: MEDICARE

## 2020-04-06 DIAGNOSIS — E8582 Wild-type transthyretin-related (ATTR) amyloidosis: Secondary | ICD-10-CM

## 2020-04-06 DIAGNOSIS — I10 Essential (primary) hypertension: Secondary | ICD-10-CM

## 2020-04-06 DIAGNOSIS — E785 Hyperlipidemia, unspecified: Secondary | ICD-10-CM

## 2020-04-06 DIAGNOSIS — J302 Other seasonal allergic rhinitis: Secondary | ICD-10-CM

## 2020-04-06 DIAGNOSIS — C4490 Unspecified malignant neoplasm of skin, unspecified: Secondary | ICD-10-CM

## 2020-04-06 DIAGNOSIS — I358 Other nonrheumatic aortic valve disorders: Secondary | ICD-10-CM

## 2020-04-06 MED ORDER — METOLAZONE 2.5 MG PO TAB
2.5 mg | ORAL_TABLET | ORAL | 0 refills | 84.00000 days | Status: AC
Start: 2020-04-06 — End: ?

## 2020-04-06 NOTE — Progress Notes
Daily CardioMEMS Readings    Goal PA Diastolic: 22 mmHg   PA Diastolic Thresholds: 123456 mmHg

## 2020-04-19 ENCOUNTER — Encounter: Admit: 2020-04-19 | Discharge: 2020-04-19 | Payer: MEDICARE

## 2020-04-19 ENCOUNTER — Ambulatory Visit: Admit: 2020-04-19 | Discharge: 2020-04-19 | Payer: MEDICARE

## 2020-04-19 DIAGNOSIS — I5032 Chronic diastolic (congestive) heart failure: Secondary | ICD-10-CM

## 2020-04-26 ENCOUNTER — Encounter: Admit: 2020-04-26 | Discharge: 2020-04-26 | Payer: MEDICARE

## 2020-04-27 MED FILL — TAFAMIDIS 61 MG PO CAP: 61 mg | ORAL | 30 days supply | Qty: 30 | Fill #2 | Status: AC

## 2020-05-07 ENCOUNTER — Encounter: Admit: 2020-05-07 | Discharge: 2020-05-07 | Payer: MEDICARE

## 2020-05-08 ENCOUNTER — Encounter: Admit: 2020-05-08 | Discharge: 2020-05-08 | Payer: MEDICARE

## 2020-05-08 DIAGNOSIS — I5030 Unspecified diastolic (congestive) heart failure: Secondary | ICD-10-CM

## 2020-05-08 MED ORDER — METOLAZONE 2.5 MG PO TAB
5 mg | ORAL_TABLET | ORAL | 3 refills | 84.00000 days | Status: AC
Start: 2020-05-08 — End: ?

## 2020-05-08 NOTE — Telephone Encounter
PADs above thresholds on recent readings. Reviewed with [name], APRN: Patient to increase metolazone dose to 5 mg twice weekly, have labs drawn next week. Patient notified, states understanding.     DAILY CardioMEMS READINGS    Goal PA Diastolic: 22 mmHg  PA Diastolic Thresholds: 20-24 mmHg          CARDIAC MEDS:    Home Medications    Medication Sig   amiodarone (CORDARONE) 200 mg tablet Take one tablet by mouth daily.   bumetanide (BUMEX) 2 mg tablet Take two tablets by mouth twice daily.   cholecalciferol (vitamin D3) (VITAMIN D3 PO) Take 1 tablet by mouth daily. Pt. Unsure of dosage   docusate (COLACE) 100 mg capsule Take 100 mg by mouth daily.   esomeprazole DR(+) (NEXIUM) 20 mg capsule Take 20 mg by mouth daily. Take on an empty stomach at least 1 hour before or 2 hours after food.   ferrous sulfate (FEOSOL) 325 mg (65 mg iron) tablet Take 325 mg by mouth twice daily. Take on an empty stomach at least 1 hour before or 2 hours after food.    fluticasone/salmeterol (ADVAIR) 100/50 mcg inhalation disk Inhale 1 puff by mouth into the lungs daily.   lactobacillus rhamnosus (GG) (CULTURELLE) 10 billion cell cap Take 1 capsule by mouth daily with breakfast.   magnesium oxide (MAG-OX) 400 mg (241.3 mg magnesium) tablet Take one tablet by mouth daily.   metOLazone (ZAROXOLYN) 2.5 mg tablet Take one tablet by mouth twice weekly. (Tuesdays and Fridays)   polyethylene glycol 3350 (MIRALAX) 17 g packet Take one packet by mouth daily.   potassium chloride SR (K-DUR) 20 mEq tablet Take two tablets by mouth daily. Take with a meal and a full glass of water. Take two tablets twice per day on 9/18, then resume daily dosing.  Patient taking differently: Take 40 mEq by mouth daily.   rivaroxaban (XARELTO) 15 mg tablet Take one tablet by mouth daily with breakfast. Take with food. Hold the day before and the morning of your CardioMEMS procedure.   rosuvastatin (CRESTOR) 5 mg tablet Take one tablet by mouth three times weekly. senna/docusate (SENOKOT-S) 8.6/50 mg tablet Take one tablet by mouth as Needed.   sildenafiL (VIAGRA) 100 mg tablet TAKE 1 TABLET BY MOUTH ONCE DAILY AS NEEDED FOR SEXUAL ACTIVITY   tafamidis (VYNDAMAX) 61 mg capsule Take one capsule by mouth daily.   VENTOLIN HFA 90 mcg/actuation inhaler Inhale 1 puff by mouth into the lungs as Needed.       RECENT LABS:    Basic Metabolic Profile    Lab Results   Component Value Date/Time    NA 137 12/20/2019 02:37 PM    K 4.1 12/20/2019 02:37 PM    CA 9.7 12/20/2019 02:37 PM    CL 92 (L) 12/20/2019 02:37 PM    CO2 37 (H) 12/20/2019 02:37 PM    GAP 8 12/20/2019 02:37 PM    Lab Results   Component Value Date/Time    BUN 57 (H) 12/20/2019 02:37 PM    CR 2.08 (H) 12/20/2019 02:37 PM    GLU 67 (L) 12/20/2019 02:37 PM          UPCOMING APPOINTMENTS:    Future Appointments   Date Time Provider Department Center   05/21/2020 12:00 AM [name]IT[facility] Procedur   06/04/2020  8:00 AM Robyne Askew, MD [facility]AP[facility]M IM   06/11/2020  9:30 AM [facility] Procedur  06/19/2020  1:00 PM [name], MD MPNEPHRO IM   06/21/2020 12:00 AM [name]OTE [facility]IT[facility] Procedur   07/09/2020  8:15 AM Robyne Askew, MD MPAPDERM IM   07/09/2020  8:30 AM Robyne Askew, MD [facility]APDERM IM   07/11/2020 10:30 AM Robyne Askew, MD QVADERM IM   07/23/2020 12:00 AM [name] REMOTE MONITORING [facility]CREMOTEHRM [facility] Procedur   07/23/2020 10:00 AM Robyne Askew, MD MPAPDERM IM   08/23/2020 12:00 AM [name] REMOTE MONITORING [facility]CREMOTEHRM [facility] Procedur   09/24/2020 12:00 AM [name] REMOTE MONITORING [facility]EMOTEHRM [facility] Procedur   10/25/2020 12:00 AM [name] REMOTE MONITORING [facility]CREMOTEHRM [facility] Procedur   11/26/2020 12:00 AM [name] REMOTE MONITORING [facility]CREMOTEHRM [facility] Procedur   12/27/2020 12:00 AM [name] REMOTE MONITORING [facility]CREMOTEHRM [facility] Procedur   01/28/2021 12:00 AM [name] REMOTE MONITORING MACREMOTEHRM [facility] Procedur

## 2020-05-17 ENCOUNTER — Encounter

## 2020-05-17 DIAGNOSIS — I5030 Unspecified diastolic (congestive) heart failure: Secondary | ICD-10-CM

## 2020-05-18 ENCOUNTER — Encounter

## 2020-05-18 DIAGNOSIS — I5032 Chronic diastolic (congestive) heart failure: Secondary | ICD-10-CM

## 2020-05-18 NOTE — Telephone Encounter
Labs reviewed with [name], APRN: Patient to hold scheduled Friday dose of metolazone, take additional 40 meq of potassium today, have labs repeated on Monday to check renal function. Patient's wife notified, states understanding.

## 2020-05-21 ENCOUNTER — Encounter

## 2020-05-21 DIAGNOSIS — I5032 Chronic diastolic (congestive) heart failure: Secondary | ICD-10-CM

## 2020-05-22 ENCOUNTER — Encounter: Admit: 2020-05-22 | Discharge: 2020-05-22 | Payer: MEDICARE

## 2020-05-22 DIAGNOSIS — I5032 Chronic diastolic (congestive) heart failure: Secondary | ICD-10-CM

## 2020-05-23 ENCOUNTER — Encounter: Admit: 2020-05-23 | Discharge: 2020-05-23 | Payer: MEDICARE

## 2020-05-25 ENCOUNTER — Encounter: Admit: 2020-05-25 | Discharge: 2020-05-25 | Payer: MEDICARE

## 2020-05-25 NOTE — Telephone Encounter
Call to pt spoke o wife MOHS sched 3/7, appt made w [facility] 3/3 @ 1230

## 2020-05-25 NOTE — Telephone Encounter
-----   Message from Bartholomew Boards, MD sent at 05/24/2020  5:04 PM CST -----  Regarding: RE: Pending Amyloid pt appt  OK WITH ME  ----- Message -----  From: Alphonzo Grieve, BSN  Sent: 05/24/2020   3:14 PM CST  To: Bartholomew Boards, MD, [facility] Nurse H[facility] Team [place]  Subject: Pending Amyloid pt appt                          Hello [facility]!      Trying to get a couple of your Amyloid pts scheduled to see you!  Is it your plan to go to [facility] on 3/22?  I can schedule Mr Hoehn at 0730 if your not going.    Please advise!      Anda Kraft RN

## 2020-05-30 ENCOUNTER — Encounter: Admit: 2020-05-30 | Discharge: 2020-05-30 | Payer: MEDICARE

## 2020-05-30 MED FILL — TAFAMIDIS 61 MG PO CAP: 61 mg | ORAL | 30 days supply | Qty: 30 | Fill #3 | Status: AC

## 2020-06-01 ENCOUNTER — Encounter: Admit: 2020-06-01 | Discharge: 2020-06-01 | Payer: MEDICARE

## 2020-06-04 ENCOUNTER — Ambulatory Visit: Admit: 2020-06-04 | Discharge: 2020-06-05 | Payer: MEDICARE

## 2020-06-04 ENCOUNTER — Encounter: Admit: 2020-06-04 | Discharge: 2020-06-04 | Payer: MEDICARE

## 2020-06-04 DIAGNOSIS — C44319 Basal cell carcinoma of skin of other parts of face: Secondary | ICD-10-CM

## 2020-06-04 DIAGNOSIS — J302 Other seasonal allergic rhinitis: Secondary | ICD-10-CM

## 2020-06-04 DIAGNOSIS — I358 Other nonrheumatic aortic valve disorders: Secondary | ICD-10-CM

## 2020-06-04 DIAGNOSIS — C4490 Unspecified malignant neoplasm of skin, unspecified: Secondary | ICD-10-CM

## 2020-06-04 DIAGNOSIS — E785 Hyperlipidemia, unspecified: Secondary | ICD-10-CM

## 2020-06-04 DIAGNOSIS — I10 Essential (primary) hypertension: Secondary | ICD-10-CM

## 2020-06-04 NOTE — Procedures
Mohs Micrographic Surgery Operative Note    Patient Name: Ray [name]  Date of Birth: 1935/11/17    Procedure: Mohs micrographic surgery  Surgeon and Pathologist: Kearney Hard, MD      Referring MD: Shanon Ace  Date of Service: 06/04/2020    A detailed discussion of the diagnosis, prognosis, and treatment options including Mohs micrographic surgery, wide local excision, radiation, destruction, and chemotherapy was performed. Based on my medical judgment, Mohs surgery is medically necessary because it is the most appropriate treatment for this cancer compared to other treatments. The rationale for Mohs surgery was explained to the patient and patient opted for Mohs surgery. The risks, benefits, and alternatives to treatment were discussed in detail. Specifically, the risks of pain, infection, swelling, bleeding, bruising, scarring, altered appearance, prolonged wound healing, incomplete removal, allergy to anesthesia, nerve injury, functional impairment, and recurrence discussed. Patient also informed that the skin cancer removal will result in a defect in the skin and soft tissue that may require repair such as a primary closure, flap, graft, or closure in the operating room if needed. If the surgery reveals advanced disease, further resection in the operating room or adjuvant treatment with radiation and/or chemotherapy may be recommended.  All questions answered. Written and verbal informed consent obtained.    Prior to the procedure, a time-out was performed in which the patient's identity was verified and treatment site(s) were clearly identified and cleaned with alcohol prep pads, marked with surgical marker, and confirmed by the patient and surgeons.  All components of time-out protocol completed. Kearney Hard, MD operated in two distinct and integrated capacities as the surgeon and pathologist.      Lesion A  Mohs Case Number:  Site:  Pre Op Dx:  Post Op Dx:  Tumor Recurrence Status:  PreOp SIze:  PostOp Size:  Number of Stages:  Repair Type(s):  Final Wound Length (cm)/ Area of Flap or Graft (cm2):  Anesthesia: local infiltration  Skin Prep:   [id number]  Mid central forehead  Basal cell carcinoma (nodular)  Basal cell carcinoma (nodular & infiltrative)  Primary  1.0 x 1.3 cm  2.0 x 2.2 cm  2  Primary (complex) repair  5.4  Lidocaine 1.0% soln w epinephrine    Chlorhexidine        Total Anesthesia per stage (mL): 3/ 3/ 9 /  /  /  /  /  /  /  /     Estimated Blood loss:  Minimal  Complications:  None    Indications for Mohs Micrographic Surgery:    The patient has a biopsy- proven Primary Basal cell carcinoma (nodular)  located on the mid central forehead    Removal of the patient's tumor is complicated by the following clinical features:    Close proximity to embryonic planes, Located in area of high recurrence, Ill defined margins, Involvement of sensitive cosmetic/functional structure and Suspected deep tissue invasion of tumor requiring margin evaluation     Stage 1:      The patient was reclined on the surgery table. The site was prepped with antiseptic, infiltrated with local anesthesia, and draped. The surgical field was again prepped with antiseptic. All visible tumor was completely debulked using a curette and/or scalpel. An excision was made 2-56mm around the debulking defect following standard Mohs technique. Hemostasis was achieved with spot electrocautery. Pressure dressing was applied. Tissue was carefully divided into number specimen(s) indicated below, which was oriented, color coded using ink, and mapped. The  specimen was then given to the technician for frozen sectioning, mounting, and staining using the Mohs protocol. Frozen section analysis showed residual tumor in number of specimens as indicated below.   Number of Specimen(s):  Number of positive specimen(s): 1  1 Nodular & infiltrative basal cell carcinoma in the dermis as indicated in red on the attached Mohs map.       Stage 2:    The patient was prepped and draped in same fashion as the first stage. Using the Mohs technique, a thin layer of tissue was removed around all areas where tumor was visible on the previous stage. Hemostasis was achieved with spot electrocautery. Pressure dressing was applied. Tissue was carefully divided into number of specimen(s) indicated below, which was oriented, color coded using ink, mapped, and processed as above using the Mohs protocol. Frozen section analysis showed residual tumor in number of specimens indicated below.  Number of Specimen(s):  Number of positive specimen(s): 1  0 Microscopic examination of tissue revealed margins clear of tumor.     Repair Note: Complex Repair    Repair Type:  complex repair  Dermal and subcutaneous suture:  4-0 monocryl  Epidermal suture:  5-0 fast gut  Estimated blood loss:  < 5mL  Complications:  None.  Patient was discharged in stable condition.    Indications:  This patient was left with a skin and soft tissue defect following Mohs surgery.  A variety of closure modalities were discussed with the patient and it was decided that a complex repair would best preserve normal anatomical and functional relationships.  Complex repair was performed for the following indications: extensive undermining was required, the inelastic skin made closure difficult, redundant tissue cones were required to avoid a deformity, to avoid disruption of free anatomic margins, to reduce tension, to reduce risk of skin necrosis, infection, and dehiscence, and to optimize functional and cosmetic results.  After discussing the risks including pain, bleeding, bruising, swelling, infection, scarring, altered appearance, contour deformity, necrosis, wound dehiscence, functional deficit, nerve damage and need for revision, informed consent was obtained and the patient underwent the procedure as follows:    Procedure:  The patient was reclined on the surgery table.  The surgical defect and surrounding skin were cleansed with antiseptic, infiltrated with local anesthesia, re-cleansed with antiseptic, and then prepped with sterile drapes.  Extensive wide undermining was performed with #15 blade and/or iris scissors. The undermined distance was greater than the greatest width of the defect along at least one entire length of the repair.  Extensive undermining was performed to avoid distortion of the eyelid margin.   Defect skin edges were de-beveled.  Redundant tissue cones were removed in order to facilitate reconstruction in the patient's natural skin tension lines and to obtain maximum functional and cosmetic results. Hemostasis was achieved with spot electrocautery.  The dermis and subcutaneous tissue were closed with buried vertical mattress sutures.  The epidermis was carefully approximated using simple running sutures.  White petrolatum and pressure dressing applied.  Verbal and written wound care instructions were given.  RTC for suture removal and/or post-op check as directed. The patient was encouraged to follow up with the referring dermatologist.      Post-operative Plan:  - Written and verbal wound care instructions reviewed  - Advised long-term follow-up with referring dermatologist for skin cancer surveillance  - Sun protection reviewed  - Scar management reviewed    - RTC as directed for suture removal and/or wound check

## 2020-06-05 DIAGNOSIS — C4441 Basal cell carcinoma of skin of scalp and neck: Secondary | ICD-10-CM

## 2020-06-09 ENCOUNTER — Encounter: Admit: 2020-06-09 | Discharge: 2020-06-09 | Payer: MEDICARE

## 2020-06-11 ENCOUNTER — Ambulatory Visit: Admit: 2020-06-11 | Discharge: 2020-06-11 | Payer: MEDICARE

## 2020-06-11 ENCOUNTER — Encounter: Admit: 2020-06-11 | Discharge: 2020-06-11 | Payer: MEDICARE

## 2020-06-11 DIAGNOSIS — E8582 Wild-type transthyretin-related (ATTR) amyloidosis: Secondary | ICD-10-CM

## 2020-06-11 MED ORDER — RP DX TC-99M PYROPHOSPHATE MCI
20 | Freq: Once | INTRAVENOUS | 0 refills | Status: CP
Start: 2020-06-11 — End: ?

## 2020-06-13 ENCOUNTER — Encounter: Admit: 2020-06-13 | Discharge: 2020-06-13 | Payer: MEDICARE

## 2020-06-18 ENCOUNTER — Encounter: Admit: 2020-06-18 | Discharge: 2020-06-18 | Payer: MEDICARE

## 2020-06-20 ENCOUNTER — Encounter: Admit: 2020-06-20 | Discharge: 2020-06-20 | Payer: MEDICARE

## 2020-06-21 ENCOUNTER — Ambulatory Visit: Admit: 2020-06-21 | Discharge: 2020-06-21 | Payer: MEDICARE

## 2020-06-21 DIAGNOSIS — I5032 Chronic diastolic (congestive) heart failure: Secondary | ICD-10-CM

## 2020-06-24 ENCOUNTER — Encounter: Admit: 2020-06-24 | Discharge: 2020-06-24 | Payer: MEDICARE

## 2020-06-24 MED ORDER — BUMETANIDE 2 MG PO TAB
ORAL_TABLET | Freq: Two times a day (BID) | 0 refills
Start: 2020-06-24 — End: ?

## 2020-06-25 NOTE — Progress Notes
Date of Service: 06/26/2020    Ray [name] is a 85 y.o. male.       HPI      I had the pleasure of seeing Ray [name], who is accompanied by his wife [name] today in [place] [facility] clinic for wild-type amyloidosis follow-up.  I had last seen him in clinic in September. In January 2022.  At that office visit I had requested a repeat TCP on 3/14 and sent a message to Dr. [name] about possible candidacy for watchman procedure.  He has had multiple skin biopsies.    I had an in basket message from Darl Pikes our Financial risk analyst, she notes that his PA diastolic is elevated at 27, his thresholds have been 20-24 with goal PA diastolic 22.  He is currently prescribed Bumex 4 mg twice daily and metolazone 5 mg 3 times weekly.    He has medical history that is significant for wild-type amyloidosis, Cardiomems in situ, atrial fibrillation status post ablation, basal cell skin cancer and CKD stage III.  Today he reports that he is feeling well, he walks laps in his garage, he denies dyspnea with this activity, he does report some dyspnea when using a chainsaw his right eye is bloodshot, he was pruning his Ray [name] and it sounds like a branch hit him in the eye.  He underwent a Mohs procedure on his forehead about 2 weeks ago.  He has a good appetite.  He has been taking his medications as prescribed.  Home blood pressures been 107/64 with pulse rate 84, lowest was 98/60.  He denies chest pain, palpitations, orthopnea, cough or PND.            Vitals:    06/26/20 1039   BP: 110/72   BP Source: Arm, Right Upper   Patient Position: Sitting   Pulse: 59   Temp: 36.1 ?C (97 ?F)   TempSrc: Skin   SpO2: 99%   Weight: 67 kg (147 lb 12.8 oz)   Height: 162.6 cm (5' 4)   PainSc: Zero     Body mass index is 25.37 kg/m?[name]     Past Medical History  Patient Active Problem List    Diagnosis Date Noted   ? 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  ?  01/26/2019 Echo EF:?60%  IVS 1.50 cm (Range: 0.6 - 1.0)         LV PW 1.40 cm (Range: 0.6 - 1.0)         ?  1. Moderate concentric left ventricular hypertrophy  2. Unable to assess diastolic function due to atrial fibrillation  3. Moderate biatrial dilatation  4. Normal right ventricular size and systolic function  5. Normal central venous pressure  6. Mild calcific aortic valve stenosis. ?Mean gradient is 16 mmHg with a peak velocity of 2.8 m/s. ?Trace aortic valve regurgitation is seen  7. Mitral valve is structurally okay. ?There is moderate central regurgitation. ?No stenosis  8. Moderate tricuspid valve regurgitation  9. Pulmonary artery static pressure measures at 33 mmHg   06/28/2019 TC PYP Multiview planar views and SPECT imaging confirms the presence of severe marked diffuse global left ventricular and right ventricular myocardial uptake of tracer. ?There is normal rib and bone uptake.  ?  Two hour heart/contralateral lung (H/CL) ratio: ?1.977?this metric is abnormal and highly suggestive of ATTR cardiac amyloidosis.?  ?  Visual Semi-Quantitative Grading Scale Analysis:?  The  study is grade 3 indicating myocardial uptake greater than rib and bone uptake. ?The pattern is highly and strongly suggestive of ATTR cardiac amyloidosis.  ?  SUMMARY/OPINION:   This study is abnormal and strongly suggestive of ATTR cardiac amyloidosis. ?Left ventricular myocardial uptake is diffuse, intense, and global. ?There is also prominent right ventricular free wall uptake of tracer. ?The pattern is typical for ATTR cardiac amyloidosis   ? Cardiac MRI ?   07/25/2019 Kappa/Lambda FLC ?  ? Ref. Range 07/25/2019 15:48   Kappa, FLC Latest Ref Range: 0.33 - 1.94 MG/DL 1.61 (H)   Lambda, FLC Latest Ref Range: 0.57 - 2.63 MG/DL 0.96 (H)   Kappa/Lambda FLC Latest Ref Range: 0.26 - 1.65  1.83 (H)      07/25/2019 Serum Immunofixation ?NO PARAPROTEIN SEEN   cancelled Urine Immunofixation ?Patient unable to provide urine sample 4/26    07/25/2019 Genetic Testing ?Negative   ? Biopsy (Fat Pad, Cardiac, Bone Marrow) ?   ? GI Symptoms ?   ?+? 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   ?  ? Ref. Range 07/25/2019 15:48   B Type Natriuretic Peptide Latest Ref Range: 0 - 100 PG/ML 966.0 (H)   Troponin-I Latest Ref Range: 0.0 - 0.05 NG/ML 0.05   ?  Completed:  -Tafamidis start 08/2019. PA approved, pt approved for [employer] patient assistance from 09/01/2019 to 03/30/2020  -Baseline KCCQ12 completed 5/18/202, Summary Score 61.46  -Baseline 6 min walk completed 08/16/2019, distance walked 950 feet, 289.75 meters, duration of test 6 minutes  -Baseline CPET completed 10/17/2019       ? Asymptomatic microscopic hematuria 12/25/2019   ? S/P left pulmonary artery pressure sensor implant placement 12/15/2019     *Patient has CardioMEMS (Pulmonary Artery Pressure Sensor)* Please call [name], CardioMEMs Program Coordinator [phone] or page Heart Failure Rounding Team if patient is admitted or presents to ED*          ? Chronic heart failure with preserved ejection fraction (HCC) 12/15/2019   ? Acute on chronic heart failure with preserved ejection fraction (HFpEF) (HCC) 11/14/2019   ? S/P ablation of atrial fibrillation 05/18/2018   ? Lightheadedness 03/16/2018   ? Hospitalization within last 30 days 12/24/2017   ? Atrial fibrillation with RVR (HCC) 12/14/2017   ? Stage 3b chronic kidney disease (HCC) 12/13/2017   ? Cardiogenic shock (HCC) 12/12/2017   ? Bradycardia 12/12/2017   ? Bilateral leg edema 12/11/2017   ? On amiodarone therapy 12/11/2017   ? Heart failure with preserved ejection fraction (HCC) 01/01/2016   ? NSVT (nonsustained ventricular tachycardia) (HCC) 06/12/2015   ? Medication side effects 11/09/2014   ? Heart palpitations 09/19/2014   ? Longstanding persistent atrial fibrillation (HCC) 03/17/2013     03/26/2018 - ECHO:  Moderate concentric LVH seen. The EF is about 55%.  The atria are slightly dilated.  The RV function is mildly decreased.  Mild to moderate MR and TR seen.  Mild AI is present.  Based on the gradients and appearance, the AS appears to be mild. The mean gradient is 16 mm Hg with a peak velocity of 2.6 m/s.  03/26/2018 - Zio Patch:  Predominant rhythm was normal sinus.  Two runs of ventricular tachycardia occurred, they were monomorphic, the longest run lasted 3 seconds, this episode occurred  on April 08, 2018 at 9:41 PM.  One episode of AIVR also appeared to be present, it lasted approximately 4.6 seconds  Brief and rare episodes of SVT that probably represent atrial fibrillation, the longest lasting 6.3 seconds.  No evidence of atrial flutter and no evidence of high degree AV block.  05/12/2018 - LAAA:  Persistent Atrial Fibrillation status post successful pulmonary vein antral isolation.  Cavotricuspid Isthmus Ablation.  Ablation of Fractionated Intracardiac Electrograms.  Ablation for an Additional Discrete Arrhythmia-LA AFL after AFIB/PVI Ablation  06/23/2018 - Cardioversion:  Successful direct current cardioversion from symptomatic atrial fibrillation to sinus rhythm.  03/16/2019 - DCCV:  Successful direct current cardioversion from atrial fibrillation to sinus rhythm.     ? Systolic murmur of aorta 02/04/2013   ? SOB (shortness of breath) 02/04/2013   ? Carotid bruit present 01/23/2012   ? Aortic stenosis, mild 01/30/2011   ? Overweight (BMI 25.0-29.9) 01/30/2011   ? Essential hypertension 01/30/2009   ? Hyperlipidemia 01/30/2009     Hyperlipidemia, on statin therapy     ? Skin cancer 01/30/2009   ? Seasonal allergic reaction 01/30/2009         Review of Systems   Constitutional: Negative.   HENT: Negative.    Eyes: Negative.    Cardiovascular: Negative.    Respiratory: Negative.    Endocrine: Negative.    Hematologic/Lymphatic: Negative.    Skin: Negative.    Musculoskeletal: Negative.    Gastrointestinal: Positive for constipation.   Genitourinary: Negative.    Neurological: Negative.    Psychiatric/Behavioral: Negative.    Allergic/Immunologic: Negative.        Physical Exam  General Appearance: In NAD.  Right eye is bloodshot.  Well-healed scar on forehead.  Neck Veins: 9 cm JVP, neck veins are distended, + HJR   Chest Inspection: chest is normal in appearance   Respiratory Effort: breathing comfortably, no respiratory distress   Auscultation/Percussion: lungs clear to auscultation, no rales, rhonchi or wheezing   Cardiac Rhythm: irregularly irregular rhythm and normal rate   Cardiac Auscultation: S1, S2 normal, no rub, no definite S3  or S4   Murmurs: grade iii/vi systolic murmur   Peripheral Circulation: normal peripheral circulation   Pedal Pulses: normal symmetric pedal pulses   Lower Extremity Edema: Trace right ankle, bilateral lower legs have venous discoloration  Abdominal Exam: soft, non-tender, no obvious masses, bowel sounds normal   Gait & Station: walks without assistance   Orientation: oriented to person, place and time   Affect & Mood: appropriate and sustained affect   Language and Memory: patient responsive and seems to comprehend information   Neurologic Exam: neurological assessment grossly intact   Vital signs were reviewed  Lower back incision dressing removed and replaced, moh's procedure tunneled    Cardiovascular Studies: echo 09/06/2019  ? The underlying rhythm is atrial fibrillation.  ? Normal left ventricular systolic function, estimated ejection fraction is 55%.  ? Severe concentric LVH, patient has known amyloidosis.  ? Probably severe diastolic dysfunction, elevated filling pressure.  ? The right ventricle is dilated, hypertrophy of the free wall, decreased systolic function.M-Mode TAPSE 1.2 cm (normal >1.7 cm).  ? Biatrial dilatation (LA?mildly dilated, RA?moderately dilated).  ? Moderate mitral valve regurgitation, moderate to severe tricuspid valve regurgitation.  ? Severely sclerotic aortic valve, by spectral Doppler calculations mild stenosis (MG= 12 mmHg, peak velocity= 2.3 m/sec, DI= 0.27 , AVA= 1.1 cm?), trace regurgitation. By 2D echo exam only, the aortic valve systolic opening is significantly restricted,  this finding is compatible with severe aortic valve stenosis. (NF-LG severe AS).  ? Estimated Peak Systolic PA Pressure 31 mmHg      6 Minute Walk Last Results 08/16/2019   SpO2:  Pre-Activity 99   Pulse:  POST-Activity 122   BP:  POST-Activity 151/76   SpO2:  POST-Activity 96   Distance Walked in [employer] 289.75       Problems Addressed Today  Encounter Diagnoses   Name Primary?   ? Aortic stenosis, mild Yes       Assessment and Plan   Wild-type amyloidosis:   -He has had a previous technetium pyrophosphate scan on 06/28/2019 that showed a 2-hour heart/contralateral lung ratio of 1.97 with visual semiquantitative grading grade 3, abnormal and highly suggestive of ATTR cardiac amyloidosis.   - He has had kappa 7.45, lambda 4.07 and free light chain ratio 1.83 on 4/26.    -He had serum electrophoresis that showed no paraprotein.   - He did a 6-minute walk test on 5/18, he was able to walk 289.75 m.    -He has completed a [facility] Q score that was 72.92 indicative of fair to good quality of life.    -He is on tafamidis 61 mg daily.    -He had a baseline CPET on 7/19 2021, will repeat this in July..    -He had repeat TC PYP on 06/11/2020, it showed 2-hour heart/contralateral lung ratio reduced to 1.7, visual semiquantitative grading scale remains at 3.  -Follow-up appointment with Dr. [name] July 14.       Heart failure with preserved ejection fraction:  - He appears euvolemic on physical exam on  Bumex 4 mg p.o. twice daily and he also takes metolazone 5 mg 3 times weekly weekly..   -We will plan to get a repeat echo around the same time he gets his CPET.    CardioMEMS in situ: -12/15/19 IMPLANT RHC:?  BP:?126/76 (99)  RA:?27  RV:?60/32  PA:?57/31 (42)  PCWP:?30  TPG:?12  PVR:?3.8  SVR:?1840  CO (Fick):?3.1  CI (Fick):?1.7?  AVO2 Diff 7.18?  Daily CardioMEMS Readings  ?Goal PA Diastolic:  22  PA Diastolic Thresholds:  20-24  His PA diastolic today is 27, above target.  He will need to increase frequency of metolazone, will plan to have him take 5 mg on Mondays and Fridays and 2.5 mg on Wednesdays.    Aortic stenosis: The most recent echocardiogram on 6/8 showed severely sclerotic aortic valve by spectral Doppler calculation mild stenosis with mean gradient of 12 mmHg, the AVA is 1.1 cm.     CKD: His creatinine on 9/21 was 2.08.  He is scheduled to see Dr. Mercie Eon on 3/ 31   Atrial fibrillation: He continues to take amiodarone 200 mg p.o.daily, he is on oral anticoagulation with Xarelto 15 mg daily, he denies any upper or lower GI bleeding symptoms.  He has been in atrial fibrillation since at least 2020, has had previous ablations.  -I will check with Dr. [name] to see if he is a candidate for watchman procedure as he continues to have frequent biopsies done for his basal skin cancer.     have personally documented the HPI, exam and medical decision making.  Patient education: I reviewed recent lab results and current medications, medication instructions, discussed heart failure signs & symptoms,  low  sodium diet, fluid restriction and daily weights.I have instructed the patient on the plan of care and they verbalize understanding of the plan. Please see AVS  for full patient teaching. Patient advised to call our office if s/he has any problems, questions, worsening symptoms, or concerns prior to the next appointment.   Thanks for allowing me to see this nice patient. If I can be of additional assistance, please don't hesitate to contact me.     [name] AGPCNP-BC, [facility]  Pager [phone]  Collaborating physician: Dr. Vanetta Shawl              No diagnosis found.                    Current Medications (including today's revisions)  ? amiodarone (CORDARONE) 200 mg tablet Take one tablet by mouth daily.   ? bumetanide (BUMEX) 2 mg tablet Take 2 tablets by mouth twice daily   ? cholecalciferol (vitamin D3) (VITAMIN D3 PO) Take 1 tablet by mouth daily. Pt. Unsure of dosage   ? docusate (COLACE) 100 mg capsule Take 100 mg by mouth daily.   ? esomeprazole DR(+) (NEXIUM) 20 mg capsule Take 20 mg by mouth daily. Take on an empty stomach at least 1 hour before or 2 hours after food.   ? ferrous sulfate (FEOSOL) 325 mg (65 mg iron) tablet Take 325 mg by mouth twice daily. Take on an empty stomach at least 1 hour before or 2 hours after food.    ? fluticasone/salmeterol (ADVAIR) 100/50 mcg inhalation disk Inhale 1 puff by mouth into the lungs daily.   ? lactobacillus rhamnosus (GG) (CULTURELLE) 10 billion cell cap Take 1 capsule by mouth daily with breakfast.   ? magnesium oxide (MAG-OX) 400 mg (241.3 mg magnesium) tablet Take one tablet by mouth daily.   ? metOLazone (ZAROXOLYN) 2.5 mg tablet Take two tablets by mouth twice weekly. (Tuesdays and Fridays)   ? polyethylene glycol 3350 (MIRALAX) 17 g packet Take one packet by mouth daily.   ? potassium chloride SR (K-DUR) 20 mEq tablet Take two tablets by mouth daily. Take with a meal and a full glass of water. Take two tablets twice per day on 9/18, then resume daily dosing. (Patient taking differently: Take 40 mEq by mouth daily.)   ? rivaroxaban (XARELTO) 15 mg tablet Take one tablet by mouth daily with breakfast. Take with food. Hold the day before and the morning of your CardioMEMS procedure.   ? rosuvastatin (CRESTOR) 5 mg tablet Take one tablet by mouth three times weekly.   ? senna/docusate (SENOKOT-S) 8.6/50 mg tablet Take one tablet by mouth as Needed.   ? sildenafiL (VIAGRA) 100 mg tablet TAKE 1 TABLET BY MOUTH ONCE DAILY AS NEEDED FOR SEXUAL ACTIVITY   ? tafamidis (VYNDAMAX) 61 mg capsule Take one capsule by mouth daily.   ? VENTOLIN HFA 90 mcg/actuation inhaler Inhale 1 puff by mouth into the lungs as Needed.

## 2020-06-26 ENCOUNTER — Encounter: Admit: 2020-06-26 | Discharge: 2020-06-26 | Payer: MEDICARE

## 2020-06-26 ENCOUNTER — Ambulatory Visit: Admit: 2020-06-26 | Discharge: 2020-06-26 | Payer: MEDICARE

## 2020-06-26 DIAGNOSIS — I10 Essential (primary) hypertension: Principal | ICD-10-CM

## 2020-06-26 DIAGNOSIS — E785 Hyperlipidemia, unspecified: Secondary | ICD-10-CM

## 2020-06-26 DIAGNOSIS — I5030 Unspecified diastolic (congestive) heart failure: Secondary | ICD-10-CM

## 2020-06-26 DIAGNOSIS — C4490 Unspecified malignant neoplasm of skin, unspecified: Secondary | ICD-10-CM

## 2020-06-26 DIAGNOSIS — I358 Other nonrheumatic aortic valve disorders: Secondary | ICD-10-CM

## 2020-06-26 DIAGNOSIS — Z20822 Encounter for screening laboratory testing for COVID-19 virus in asymptomatic patient: Secondary | ICD-10-CM

## 2020-06-26 DIAGNOSIS — J302 Other seasonal allergic rhinitis: Secondary | ICD-10-CM

## 2020-06-26 MED ORDER — BUMETANIDE 2 MG PO TAB
4 mg | ORAL_TABLET | Freq: Two times a day (BID) | ORAL | 3 refills | Status: AC
Start: 2020-06-26 — End: ?

## 2020-06-26 MED ORDER — METOLAZONE 2.5 MG PO TAB
ORAL_TABLET | ORAL | 3 refills | 84.00000 days | Status: AC
Start: 2020-06-26 — End: ?

## 2020-06-26 NOTE — Progress Notes
PAD above Goal today at 27. Pt has appt with [name], APRN @ 11am.     Daily CardioMEMS Readings    Goal PA Diastolic: 22 mmHg   PA Diastolic Thresholds: 36-14 mmHg

## 2020-06-28 ENCOUNTER — Ambulatory Visit: Admit: 2020-06-28 | Discharge: 2020-06-29 | Payer: MEDICARE

## 2020-06-28 ENCOUNTER — Encounter: Admit: 2020-06-28 | Discharge: 2020-06-28 | Payer: MEDICARE

## 2020-06-28 DIAGNOSIS — E785 Hyperlipidemia, unspecified: Secondary | ICD-10-CM

## 2020-06-28 DIAGNOSIS — C4490 Unspecified malignant neoplasm of skin, unspecified: Secondary | ICD-10-CM

## 2020-06-28 DIAGNOSIS — I358 Other nonrheumatic aortic valve disorders: Secondary | ICD-10-CM

## 2020-06-28 DIAGNOSIS — I10 Essential (primary) hypertension: Secondary | ICD-10-CM

## 2020-06-28 DIAGNOSIS — J302 Other seasonal allergic rhinitis: Secondary | ICD-10-CM

## 2020-06-28 NOTE — Patient Instructions
Please do not hesitate to call with any questions.  My nurse Jarrett Soho can be reached [phone].     Please go to the lab this week      I will send a lab order to [facility] in [place]    Return to clinic in 6 months

## 2020-06-29 ENCOUNTER — Encounter: Admit: 2020-06-29 | Discharge: 2020-06-29 | Payer: MEDICARE

## 2020-06-29 NOTE — Progress Notes
The specialty pharmacy is currently waiting on the following information for Ray [name]'s specialty medication, Vyndamax:    Patient's signature and Patient's financial information    The specialty pharmacy team has requested this information and until it is received the specialty pharmacy will not be able to continue the process for specialty medication access.    [place] Patient Advocate  [phone]

## 2020-07-01 MED FILL — TAFAMIDIS 61 MG PO CAP: 61 mg | ORAL | 30 days supply | Qty: 30 | Fill #4 | Status: AC

## 2020-07-02 ENCOUNTER — Encounter: Admit: 2020-07-02 | Discharge: 2020-07-02 | Payer: MEDICARE

## 2020-07-03 ENCOUNTER — Encounter: Admit: 2020-07-03 | Discharge: 2020-07-03 | Payer: MEDICARE

## 2020-07-06 ENCOUNTER — Encounter: Admit: 2020-07-06 | Discharge: 2020-07-06 | Payer: MEDICARE

## 2020-07-06 MED ORDER — BUMETANIDE 2 MG PO TAB
ORAL_TABLET | Freq: Every day | ORAL | 3 refills | Status: AC
Start: 2020-07-06 — End: ?

## 2020-07-06 MED ORDER — METOLAZONE 2.5 MG PO TAB
2.5 mg | ORAL_TABLET | ORAL | 3 refills | 84.00000 days | Status: AC
Start: 2020-07-06 — End: ?

## 2020-07-06 NOTE — Telephone Encounter
PADs and labs reviewed by [name], APRN: Patient to reduce metolazone to 2.5 mg on MWF, increase Bumex to 5 mg in am, 4 mg in pm, have labs drawn in one week.        DAILY CardioMEMS READINGS    Goal PA Diastolic: 22 mmHg  PA Diastolic Thresholds: 20-24 mmHg          CARDIAC MEDS:    Home Medications    Medication Sig   amiodarone (CORDARONE) 200 mg tablet Take one tablet by mouth daily.   bumetanide (BUMEX) 2 mg tablet Take two tablets by mouth twice daily.   cholecalciferol (vitamin D3) (VITAMIN D3 PO) Take 1 tablet by mouth daily. Pt. Unsure of dosage   docusate (COLACE) 100 mg capsule Take 100 mg by mouth daily.   esomeprazole DR(+) (NEXIUM) 20 mg capsule Take 20 mg by mouth daily. Take on an empty stomach at least 1 hour before or 2 hours after food.   ferrous sulfate (FEOSOL) 325 mg (65 mg iron) tablet Take 325 mg by mouth twice daily. Take on an empty stomach at least 1 hour before or 2 hours after food.    fluticasone/salmeterol (ADVAIR) 100/50 mcg inhalation disk Inhale 1 puff by mouth into the lungs daily.   lactobacillus rhamnosus (GG) (CULTURELLE) 10 billion cell cap Take 1 capsule by mouth daily with breakfast.   magnesium oxide (MAG-OX) 400 mg (241.3 mg magnesium) tablet Take one tablet by mouth daily.   metOLazone (ZAROXOLYN) 2.5 mg tablet Take 5 mg Mondays and Fridays, 2.5 mg on Wednesdays.   polyethylene glycol 3350 (MIRALAX) 17 g packet Take one packet by mouth daily.   potassium chloride SR (K-DUR) 20 mEq tablet Take two tablets by mouth daily. Take with a meal and a full glass of water. Take two tablets twice per day on 9/18, then resume daily dosing.  Patient taking differently: Take 40 mEq by mouth daily.   rivaroxaban (XARELTO) 15 mg tablet Take one tablet by mouth daily with breakfast. Take with food. Hold the day before and the morning of your CardioMEMS procedure.   rosuvastatin (CRESTOR) 5 mg tablet Take one tablet by mouth three times weekly.   senna/docusate (SENOKOT-S) 8.6/50 mg tablet Take one tablet by mouth as Needed.   sildenafiL (VIAGRA) 100 mg tablet TAKE 1 TABLET BY MOUTH ONCE DAILY AS NEEDED FOR SEXUAL ACTIVITY   tafamidis (VYNDAMAX) 61 mg capsule Take one capsule by mouth daily.   VENTOLIN HFA 90 mcg/actuation inhaler Inhale 1 puff by mouth into the lungs as Needed.       RECENT LABS:    Basic Metabolic Profile    Lab Results   Component Value Date/Time    NA 135 07/02/2020 12:00 AM    K 3.2 07/02/2020 12:00 AM    CA 10.0 07/02/2020 12:00 AM    CL 86 07/02/2020 12:00 AM    CO2 33.0 07/02/2020 12:00 AM    GAP 19 (H) 05/21/2020 12:00 AM    Lab Results   Component Value Date/Time    BUN 46 07/02/2020 12:00 AM    CR 2.74 07/02/2020 12:00 AM    GLU 137 07/02/2020 12:00 AM          UPCOMING APPOINTMENTS:    Future Appointments   Date Time Provider Department Center   07/09/2020  8:15 AM Robyne Askew, MD MPAPDERM IM   07/09/2020  8:30 AM Robyne Askew, MD MPAPDERM IM   07/11/2020 10:30 AM Robyne Askew,  MD QVADERM IM   07/23/2020 12:00 AM [name] REMOTE MONITORING MACREMOTEHRM CVM Procedur   07/23/2020 10:00 AM Robyne Askew, MD MPAPDERM IM   08/23/2020 12:00 AM [name] REMOTE MONITORING MACREMOTEHRM CVM Procedur   09/24/2020 12:00 AM [name] REMOTE MONITORING MACREMOTEHRM CVM Procedur   10/09/2020 12:00 PM BIKE/TREADMILL PULMFN1 None   10/11/2020  9:00 AM [address] ECHO MACSTAVEECPV CVM Procedur   10/11/2020 10:30 AM Vanetta Shawl I, MD MACSTAVECL CVM Exam   10/25/2020 12:00 AM [name] REMOTE MONITORING MACREMOTEHRM CVM Procedur   11/26/2020 12:00 AM [name] REMOTE MONITORING MACREMOTEHRM CVM Procedur   12/27/2020 12:00 AM [name] REMOTE MONITORING MACREMOTEHRM CVM Procedur   01/28/2021 12:00 AM [name] REMOTE MONITORING MACREMOTEHRM CVM Procedur

## 2020-07-09 ENCOUNTER — Ambulatory Visit: Admit: 2020-07-09 | Discharge: 2020-07-09 | Payer: MEDICARE

## 2020-07-09 ENCOUNTER — Encounter: Admit: 2020-07-09 | Discharge: 2020-07-09 | Payer: MEDICARE

## 2020-07-09 DIAGNOSIS — J302 Other seasonal allergic rhinitis: Secondary | ICD-10-CM

## 2020-07-09 DIAGNOSIS — I358 Other nonrheumatic aortic valve disorders: Secondary | ICD-10-CM

## 2020-07-09 DIAGNOSIS — C4490 Unspecified malignant neoplasm of skin, unspecified: Secondary | ICD-10-CM

## 2020-07-09 DIAGNOSIS — C44319 Basal cell carcinoma of skin of other parts of face: Secondary | ICD-10-CM

## 2020-07-09 DIAGNOSIS — I10 Essential (primary) hypertension: Principal | ICD-10-CM

## 2020-07-09 DIAGNOSIS — E785 Hyperlipidemia, unspecified: Secondary | ICD-10-CM

## 2020-07-11 ENCOUNTER — Encounter: Admit: 2020-07-11 | Discharge: 2020-07-11 | Payer: MEDICARE

## 2020-07-13 ENCOUNTER — Encounter: Admit: 2020-07-13 | Discharge: 2020-07-13 | Payer: MEDICARE

## 2020-07-13 DIAGNOSIS — I5030 Unspecified diastolic (congestive) heart failure: Secondary | ICD-10-CM

## 2020-07-13 MED ORDER — POTASSIUM CHLORIDE 20 MEQ PO TBTQ
ORAL_TABLET | Freq: Three times a day (TID) | ORAL | 3 refills | 30.00000 days | Status: AC
Start: 2020-07-13 — End: ?

## 2020-07-13 NOTE — Telephone Encounter
Received call from [facility] reporting critical potassium of 2.5 today.     Called and spoke to patient. He tells me that he is taking potassium 40 mEq daily plus 20 mEq on M,W,F. Ray [name] also tells me that he has been holding Xarelto since his surgery to remove a cancerous mole from his face. He had issues with bleeding, but hopes to restart Xarelto tomorrow. Patient is aware of stroke risk with holding Xarelto.     Called and spoke with Ray [name]. Reviewed that note from cardiomems team on 4/8: "PADs and labs reviewed by Ray Roys, APRN: Patient to reduce metolazone to 2.5 mg on MWF, increase Bumex to 5 mg in am, 4 mg in pm, have labs drawn in one week." Ray [name] gave verbal order for patient to take an extra Potassium 40 mEq PO today. Will provide additional recommendations when we receive the rest of the labs.     Called and spoke to Ray [name], He is agreeable to to plan and at this time has no further questions or concerns.

## 2020-07-13 NOTE — Telephone Encounter
Reviewed lab results with Hinton Dyer she recommends that patient take an additional 20 mEq of potassium tonight. Then starting tomorrow new dose of potassium 20 mEq TID plus an additional 20 mEq on MWF, repeat BMP in a week. Called and spoke to patient. He is agreeable to the plan and request [facility] message with K+ instructions. Patient will have BMP at [facility].

## 2020-07-16 ENCOUNTER — Encounter: Admit: 2020-07-16 | Discharge: 2020-07-16 | Payer: MEDICARE

## 2020-07-16 DIAGNOSIS — I5030 Unspecified diastolic (congestive) heart failure: Secondary | ICD-10-CM

## 2020-07-16 LAB — BASIC METABOLIC PANEL
Lab: 124 — AB (ref 8.4–25.7)
Lab: 132 — AB (ref 136–145)
Lab: 15 — AB (ref 0–14)
Lab: 2.9 — AB (ref 3.5–5.1)
Lab: 21
Lab: 223 — AB (ref 70–105)
Lab: 3 — AB (ref 0.72–1.25)
Lab: 35 — AB (ref 23–31)
Lab: 85 — AB (ref 98–107)

## 2020-07-17 ENCOUNTER — Encounter: Admit: 2020-07-17 | Discharge: 2020-07-17 | Payer: MEDICARE

## 2020-07-19 ENCOUNTER — Encounter: Admit: 2020-07-19 | Discharge: 2020-07-19 | Payer: MEDICARE

## 2020-07-19 NOTE — Progress Notes
Copay assistance was obtained for the specialty medication Vyndamax using grant from [facility] and now the copay is $0.  Ray [name] has stated this copay is affordable.  The specialty pharmacy will pursue additional copay assistance as necessary.  The specialty pharmacy will reach out to the ambulatory clinical pharmacist if the copay becomes unaffordable.    The medication will be delivered to patient's prescription address per the patient's request.    [place] Patient Advocate  [phone]

## 2020-07-21 ENCOUNTER — Encounter: Admit: 2020-07-21 | Discharge: 2020-07-21 | Payer: MEDICARE

## 2020-07-21 MED ORDER — BUMETANIDE 2 MG PO TAB
ORAL_TABLET | Freq: Two times a day (BID) | 0 refills
Start: 2020-07-21 — End: ?

## 2020-07-23 ENCOUNTER — Encounter: Admit: 2020-07-23 | Discharge: 2020-07-23 | Payer: MEDICARE

## 2020-07-23 ENCOUNTER — Ambulatory Visit: Admit: 2020-07-23 | Discharge: 2020-07-23 | Payer: MEDICARE

## 2020-07-23 DIAGNOSIS — I358 Other nonrheumatic aortic valve disorders: Secondary | ICD-10-CM

## 2020-07-23 DIAGNOSIS — E785 Hyperlipidemia, unspecified: Secondary | ICD-10-CM

## 2020-07-23 DIAGNOSIS — C44319 Basal cell carcinoma of skin of other parts of face: Secondary | ICD-10-CM

## 2020-07-23 DIAGNOSIS — I10 Essential (primary) hypertension: Secondary | ICD-10-CM

## 2020-07-23 DIAGNOSIS — L578 Other skin changes due to chronic exposure to nonionizing radiation: Secondary | ICD-10-CM

## 2020-07-23 DIAGNOSIS — C4490 Unspecified malignant neoplasm of skin, unspecified: Secondary | ICD-10-CM

## 2020-07-23 DIAGNOSIS — I5032 Chronic diastolic (congestive) heart failure: Secondary | ICD-10-CM

## 2020-07-23 DIAGNOSIS — J302 Other seasonal allergic rhinitis: Secondary | ICD-10-CM

## 2020-07-23 DIAGNOSIS — C44311 Basal cell carcinoma of skin of nose: Secondary | ICD-10-CM

## 2020-07-23 NOTE — Progress Notes
MOHS CONSULT NOTE    Dermatologic Surgery Consult Note:    Subjective:       Chief Complaint: skin cancer    History of Present Illness  Ray [name] is a 85 y.o. male who presents for consultation for treatment of skin cancer of unknown duration. Patient initially planned for additional Mohs today but he has an upcoming vacation to [place] and prefers to defer treatment today due to travel and needed additional healing time after his recent Mohs surgery. Patient also has a few other lesions of concern, especially head and neck area that have not yet been biopsied.         A. Right preauricular cheek superior: ?   -- Infiltrative basal cell carcinoma, deep margin positive (C44.91) - will treat with Mohs    B. Right preauricular cheek inferior: ?   -- Nodular basal cell carcinoma, deep margin positive (C44.91) - will treat with Mohs    C. Mid central forehead: ?   -- Nodular basal cell carcinoma, deep margin positive (C44.91) - s/p Mohs 06/04/20    D. Nasal tip: ?   -- Early infiltrative basal cell carcinoma (C44.91) - will treat with Mohs     E. Right malar cheek: ?   -- Infiltrative basal cell carcinoma, deep margin positive (C44.91) - s/p Mohs 07/10/20?   ?   Reviewed Epic   Medical History:   Diagnosis Date   ? 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 Essential hypertension 01/30/2009   ? Hyperlipidemia 01/30/2009   ? Hypertension 01/30/2009   ? Seasonal allergic reaction 01/30/2009   ? Skin cancer 01/30/2009     Surgical History:   Procedure Laterality Date   ? INTRACARDIAC CATHETER ABLATION WITH COMPREHENSIVE ELECTROPHYSIOLOGIC EVALUATION - ATRIAL FIBRILLATION N/A 05/12/2018    Performed by Jen Mow, MD at [facility] EP LAB   ? CATHETERIZATION RIGHT HEART WITH INSERTION PULMONARY ARTERY SENSOR N/A 12/15/2019    Performed by Harley Alto, MD at [facility] CATH LAB     Family History   Problem Relation Age of Onset   ? Stroke Father      Social History     Socioeconomic History   ? Marital status: Married     Spouse name: Not on file   ? Number of children: Not on file   ? Years of education: Not on file   ? Highest education level: Not on file   Occupational History   ? Not on file   Tobacco Use   ? Smoking status: Never Smoker   ? Smokeless tobacco: Never Used   Vaping Use   ? Vaping Use: Never used   Substance and Sexual Activity   ? Alcohol use: Yes     Alcohol/week: 1.0 standard drink     Types: 1 Cans of beer per week     Comment: OCCASIONALLY   ? Drug use: No   ? Sexual activity: Not on file   Other Topics Concern   ? Not on file   Social History Narrative   ? Not on file     Vaping/E-liquid Use   ? Vaping Use Never User                    Objective:         ? amiodarone (CORDARONE) 200 mg tablet Take one tablet by mouth daily.   ? bumetanide (BUMEX) 2 mg tablet Take 2.5 tablets by mouth every morning AND two tablets daily  after lunch.   ? cholecalciferol (vitamin D3) (VITAMIN D3 PO) Take 1 tablet by mouth daily. Pt. Unsure of dosage   ? docusate (COLACE) 100 mg capsule Take 100 mg by mouth daily.   ? esomeprazole DR(+) (NEXIUM) 20 mg capsule Take 20 mg by mouth daily. Take on an empty stomach at least 1 hour before or 2 hours after food.   ? ferrous sulfate (FEOSOL) 325 mg (65 mg iron) tablet Take 325 mg by mouth twice daily. Take on an empty stomach at least 1 hour before or 2 hours after food.    ? fluticasone/salmeterol (ADVAIR) 100/50 mcg inhalation disk Inhale 1 puff by mouth into the lungs daily.   ? lactobacillus rhamnosus (GG) (CULTURELLE) 10 billion cell cap Take 1 capsule by mouth daily with breakfast.   ? magnesium oxide (MAG-OX) 400 mg (241.3 mg magnesium) tablet Take one tablet by mouth daily.   ? metOLazone (ZAROXOLYN) 2.5 mg tablet Take one tablet by mouth three times weekly.   ? polyethylene glycol 3350 (MIRALAX) 17 g packet Take one packet by mouth daily.   ? potassium chloride SR (K-DUR) 20 mEq tablet Take one tablet by mouth three times daily AND one tablet three times weekly. Take on MON, WED, FRI when you take metolazone. Take with a meal and a full glass of water.   ? rivaroxaban (XARELTO) 15 mg tablet Take one tablet by mouth daily with breakfast. Take with food. Hold the day before and the morning of your CardioMEMS procedure.   ? rosuvastatin (CRESTOR) 5 mg tablet Take one tablet by mouth three times weekly.   ? senna/docusate (SENOKOT-S) 8.6/50 mg tablet Take one tablet by mouth as Needed.   ? sildenafiL (VIAGRA) 100 mg tablet TAKE 1 TABLET BY MOUTH ONCE DAILY AS NEEDED FOR SEXUAL ACTIVITY   ? tafamidis (VYNDAMAX) 61 mg capsule Take one capsule by mouth daily.   ? VENTOLIN HFA 90 mcg/actuation inhaler Inhale 1 puff by mouth into the lungs as Needed.     Vitals:    07/23/20 0953   PainSc: Zero   Weight: 67 kg (147 lb 11.3 oz)   Height: 162.6 cm (5' 4.02)     Body mass index is 25.34 kg/m?[name]     Physical Exam    WDWN male in NAD.  AAO x 3.   Mood/affect is pleasant and appropriate. Physical examination revealed the following:  - Well healing scar on forehead  - Well healing scar on right cheek with focal area of superficial dehiscence  - ~ 2 x 2.5 cm pink pearly plaque on right preauricular cheek superior  - ~ 1.5 x 1 cm pink pearly plaque on right preauricular cheek inferior~-   - ~1.5 x 1.5 cm pink pearly ulcerated plaque on nasal tip  - Scattered other pink pearly to keratotic papules and plaques on head and neck    Data Reviewed: pathology report, biopsy photo and provider referral notes      Assessment and Plan:  1.   Biopsy-proven Basal cell carcinoma (infiltrative) on the Right preauricular cheek superior    We discussed the malignant nature of the lesion. Treatment options including surgery, destruction, radiation, and topical therapy discussed.  Mohs micrographic surgery and repair was recommended given the histologic and clinical features of the lesion, the location, the need for complete margin assessment for optimal cure, and need for tissue preservation. The patient consented to have the procedure at an upcoming visit. Will reschedule today.    2.  Biopsy-proven Basal cell carcinoma (nodular) on the  Right preauricular cheek inferior    We discussed the malignant nature of the lesion. Treatment options including surgery, destruction, radiation, and topical therapy discussed.  Mohs micrographic surgery and repair was recommended given the histologic and clinical features of the lesion, the location, the need for complete margin assessment for optimal cure, and need for tissue preservation. The patient consented to have the procedure at an upcoming visit. Will reschedule today.    3.   Biopsy-proven Basal cell carcinoma (nodular) on the Mid central forehead - s/p Mohs    4.   Biopsy-proven Basal cell carcinoma (infiltrative) on the Nasal tip    We discussed the malignant nature of the lesion. Treatment options including surgery, destruction, radiation, and topical therapy discussed.  Mohs micrographic surgery and repair was recommended given the histologic and clinical features of the lesion, the location, the need for complete margin assessment for optimal cure, and need for tissue preservation. The patient consented to have the procedure at an upcoming visit.    5.   Biopsy-proven Basal cell carcinoma (infiltrative) on the right malar cheek - s/p Mohs    6.   Numerous other lesions suspicious for BCC/SCC of head and neck    Discuss suspected diagnosis with patient. Recommend biopsies at follow-up after patient returns from travel. Will plan for FBSE at upcoming appt in May.    Sunscreen/ sun avoidance reinforced. Patient instructed to continue to follow-up with primary dermatologist for routine skin screenings.    Kearney Hard, MD  Attending Physician

## 2020-07-30 ENCOUNTER — Encounter: Admit: 2020-07-30 | Discharge: 2020-07-30 | Payer: MEDICARE

## 2020-07-30 NOTE — Telephone Encounter
PC to [facility] to obtain recent BMP's. They will fax asap to [place] for provider review. Will enter and send to provider with recent PAD's that have been above his thresholds x 4 days. See info below:

## 2020-07-30 NOTE — Telephone Encounter
Daily PAD's above thresholds:    DAILY CardioMEMS READINGS    Goal PA Diastolic: 22 mmHg  PA Diastolic Thresholds: 20-24 mmHg          SOA with activity: Yes -Pt reports mild amount since 07/28/20  SOA at rest: No  Edema: Yes - LE mild amt  Cough:No  Chest pain: No  Palpitations/heart racing: No  Orthopnea: No  PND: Yes (mild amount since Saturday)  Weight gain: Yes (weight up this am to 147lbs)  Dizziness/Ligheadedness: No  Recent Fevers: No  Other: Yes - (accident 07/27/20 large gash/ cut to left lower leg.  Unable to stitch close so used steri-strips. Now infected. Clindamycin caused N/V so dc'd 5/2 and started SMZ/TMP 700-160mg  BID)    Early last week Pt had accident cutting large piece of tissue from his left lower leg. Pt was treated in local ER. Pt saw PCP and was started on Clindamycin 300mg  four times a day which made him very sick to his stomach, N/V could not eat.  He returned to see PCP today (Dr. Lucretia Roers because reg. pcp on vacation). Pt was taken off Clindamycin and started on SMZ/TMP 800-160mg  twice daily for 14 days.  Pt has appt now with [facility] tomorrow for help healing the wound to his leg.  The day after the pt had accident to his leg he reports undergoing a MOHS surgery at [facility] for basal skin cancer near his eye. He reports that they had to bandage his eye shut and it was very painful.  He reports between the gash in his left lower leg, MOHS procedure and his Heart Failure symptoms he is worn out. Pt took his Metolazone today as scheduled.  He reports his weight was up higher than it has been in a long time at 147lbs. And he states I am worn out.  Pt given emotional support. Med reviewed with pt. He is taking them according to Epic chart. Informed pt I would route his PAD's and his info to [name], APRN and return his call tomorrow. Pt is agreeable to plan.        Blood Pressure Weight  (Lbs)   07/30/2020 105/55 147   07/29/2020 117/60 144   07/28/2020 120/60 145   07/27/2020 100's-119's/50-60's 141-145       CARDIAC MEDS:    Home Medications    Medication Sig   amiodarone (CORDARONE) 200 mg tablet Take one tablet by mouth daily.   bumetanide (BUMEX) 2 mg tablet Take 2.5 tablets by mouth every morning AND two tablets daily after lunch.   magnesium oxide (MAG-OX) 400 mg (241.3 mg magnesium) tablet Take one tablet by mouth daily.   metOLazone (ZAROXOLYN) 2.5 mg tablet Take one tablet by mouth three times weekly.   potassium chloride SR (K-DUR) 20 mEq tablet Take one tablet by mouth three times daily AND one tablet three times weekly. Take on MON, WED, FRI when you take metolazone. Take with a meal and a full glass of water.   rivaroxaban (XARELTO) 15 mg tablet Take one tablet by mouth daily with breakfast. Take with food. Hold the day before and the morning of your CardioMEMS procedure.   rosuvastatin (CRESTOR) 5 mg tablet Take one tablet by mouth three times weekly.   sildenafiL (VIAGRA) 100 mg tablet TAKE 1 TABLET BY MOUTH ONCE DAILY AS NEEDED FOR SEXUAL ACTIVITY   tafamidis (VYNDAMAX) 61 mg capsule Take one capsule by mouth daily.  RECENT LABS:    Basic Metabolic Profile    Lab Results   Component Value Date/Time    NA 132 (L) 07/20/2020 12:00 AM    K 3.3 (L) 07/20/2020 12:00 AM    CA 9.1 07/20/2020 12:00 AM    CL 87 (L) 07/20/2020 12:00 AM    CO2 29.0 07/20/2020 12:00 AM    GAP 19 (H) 07/20/2020 12:00 AM    Lab Results   Component Value Date/Time    BUN 120.0 (H) 07/20/2020 12:00 AM    CR 2.71 (H) 07/20/2020 12:00 AM    GLU 191 (H) 07/20/2020 12:00 AM          UPCOMING APPOINTMENTS:    Future Appointments   Date Time Provider Department Center   10/09/2020 12:00 PM BIKE/TREADMILL PULMFN1 None   10/11/2020  9:00 AM [address] ECHO MACSTAVEECPV CVM Procedur   10/11/2020 10:30 AM Vanetta Shawl I, MD MACSTAVECL CVM Exam   10/15/2020 11:00 AM Robyne Askew, MD MPAPDERM IM

## 2020-07-30 NOTE — Progress Notes
Routed BMP to [name], APRN

## 2020-07-31 ENCOUNTER — Encounter: Admit: 2020-07-31 | Discharge: 2020-07-31 | Payer: MEDICARE

## 2020-08-01 ENCOUNTER — Encounter: Admit: 2020-08-01 | Discharge: 2020-08-01 | Payer: MEDICARE

## 2020-08-01 MED ORDER — VYNDAMAX 61 MG PO CAP
61 mg | ORAL_CAPSULE | Freq: Every day | ORAL | 11 refills | Status: AC
Start: 2020-08-01 — End: ?
  Filled 2020-08-01: qty 30, 30d supply, fill #1

## 2020-08-07 ENCOUNTER — Encounter: Admit: 2020-08-07 | Discharge: 2020-08-07 | Payer: MEDICARE

## 2020-08-07 ENCOUNTER — Ambulatory Visit: Admit: 2020-08-07 | Discharge: 2020-08-07 | Payer: MEDICARE

## 2020-08-07 DIAGNOSIS — I52 Other heart disorders in diseases classified elsewhere: Secondary | ICD-10-CM

## 2020-08-07 DIAGNOSIS — I5030 Unspecified diastolic (congestive) heart failure: Secondary | ICD-10-CM

## 2020-08-07 DIAGNOSIS — I10 Essential (primary) hypertension: Secondary | ICD-10-CM

## 2020-08-07 DIAGNOSIS — I358 Other nonrheumatic aortic valve disorders: Secondary | ICD-10-CM

## 2020-08-07 DIAGNOSIS — E785 Hyperlipidemia, unspecified: Secondary | ICD-10-CM

## 2020-08-07 DIAGNOSIS — J302 Other seasonal allergic rhinitis: Secondary | ICD-10-CM

## 2020-08-07 DIAGNOSIS — C4490 Unspecified malignant neoplasm of skin, unspecified: Secondary | ICD-10-CM

## 2020-08-07 LAB — COMPREHENSIVE METABOLIC PANEL
ALBUMIN: 4.2 g/dL (ref 3.5–5.0)
ALK PHOSPHATASE: 78 U/L (ref 25–110)
ALT: 14 U/L (ref 7–56)
ANION GAP: 17 — ABNORMAL HIGH (ref 3–12)
AST: 30 U/L (ref 7–40)
BLD UREA NITROGEN: 93 mg/dL — ABNORMAL HIGH (ref 7–25)
CALCIUM: 9.5 mg/dL (ref 8.5–10.6)
CHLORIDE: 92 MMOL/L — ABNORMAL LOW (ref 98–110)
CO2: 26 MMOL/L (ref 21–30)
CREATININE: 2.7 mg/dL — ABNORMAL HIGH (ref 0.4–1.24)
EGFR: 22 mL/min — ABNORMAL LOW (ref >60)
GLUCOSE,PANEL: 81 mg/dL (ref 70–100)
POTASSIUM: 3.4 MMOL/L — ABNORMAL LOW (ref 3.5–5.1)
SODIUM: 135 MMOL/L — ABNORMAL LOW (ref 137–147)
TOTAL BILIRUBIN: 0.8 mg/dL (ref 0.3–1.2)
TOTAL PROTEIN: 8.1 g/dL — ABNORMAL HIGH (ref 6.0–8.0)

## 2020-08-07 LAB — BNP (B-TYPE NATRIURETIC PEPTI): BNP: 120 pg/mL — ABNORMAL HIGH (ref 0–100)

## 2020-08-07 MED ORDER — POTASSIUM CHLORIDE 20 MEQ PO TBTQ
ORAL_TABLET | Freq: Three times a day (TID) | ORAL | 3 refills | 30.00000 days | Status: DC
Start: 2020-08-07 — End: 2020-08-07

## 2020-08-07 MED ORDER — BUMETANIDE 2 MG PO TAB
5 mg | ORAL_TABLET | Freq: Two times a day (BID) | ORAL | 3 refills | Status: AC
Start: 2020-08-07 — End: ?

## 2020-08-07 MED ORDER — POTASSIUM CHLORIDE 20 MEQ PO TBTQ
ORAL_TABLET | Freq: Every day | 3 refills | 30.00000 days | Status: AC
Start: 2020-08-07 — End: ?

## 2020-08-07 NOTE — Telephone Encounter
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  His PASP has increased from 31 to 46.  His EF is preserved.    Please send referral for [facility], and please let him know this new finding.  I will copy Dr. Earnest Conroy. Manuella Ghazi to keep him UTD.  Thanks. DM

## 2020-08-07 NOTE — Telephone Encounter
Call to pt with ECHO results and recs from [facility] pt agreeable, pt asks if ok to travel to [place] in Friday, advised pt will check with Dr Manuella Ghazi and update him tomorrow.  Pt verb understanding

## 2020-08-07 NOTE — Telephone Encounter
referral order placed, message sent to [facility]

## 2020-08-07 NOTE — Progress Notes
Date of Service: 08/07/2020    Ray [name] is a 85 y.o. male.       HPI      I had the pleasure of seeing Ray [name], who is accompanied by his wife Ray [name] today in [place] cardiovascular medicine clinic for wild-type amyloidosis follow-up.  I had last seen him in clinic on March 29, at that office visit I had  increased his metolazone to 5 mg on Mondays, Wednesdays and Fridays.  He has had frequent repeat labs, his last labs on 4/22 showed low sodium of 132, potassium was 3.3, creatinine was 2.71 with a BUN of 120.  He has implanted Cardiomems, his PAD's had been above threshold.    He has medical history that is significant for wild-type amyloidosis, Cardiomems in situ, atrial fibrillation status post ablation, basal cell skin cancer and CKD stage III.      Today he reports that he thinks his weight is up about 3 pounds.  Of note he has had a left leg wound, he had stumbled against some concrete blocks (he had denied any dizziness or lightheadedness), he tells me that he was on clindamycin and had a really poor appetite, then they switched his antibiotics and he has been having wound care to the left lower leg.  He has had some difficulty swallowing, he denies chest pain, he has not had any heartburn type symptoms since the clindamycin was discontinued.  Of note they are planning to go to [facility] on Friday for 8 days.  His home blood pressure has been 107/50 9-1 17/59 with pulse rates about 65 bpm.  He did have a complex Mohs procedure near his right eye.  He is adherent to low sodium diet, and has been taking his medications as prescribed.              Vitals:    08/07/20 1106   BP: 128/58   BP Source: Arm, Right Upper   Pulse: 93   SpO2: 99%   PainSc: Zero   Weight: 66.7 kg (147 lb)   Height: 162.6 cm (5' 4)     Body mass index is 25.23 kg/m?[name]     Past Medical History  Patient Active Problem List    Diagnosis Date Noted   ? 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  ?  01/26/2019 Echo EF:?60%  IVS 1.50 cm (Range: 0.6 - 1.0)         LV PW 1.40 cm (Range: 0.6 - 1.0)         ?  1. Moderate concentric left ventricular hypertrophy  2. Unable to assess diastolic function due to atrial fibrillation  3. Moderate biatrial dilatation  4. Normal right ventricular size and systolic function  5. Normal central venous pressure  6. Mild calcific aortic valve stenosis. ?Mean gradient is 16 mmHg with a peak velocity of 2.8 m/s. ?Trace aortic valve regurgitation is seen  7. Mitral valve is structurally okay. ?There is moderate central regurgitation. ?No stenosis  8. Moderate tricuspid valve regurgitation  9. Pulmonary artery static pressure measures at 33 mmHg   06/28/2019 TC PYP Multiview planar views and SPECT imaging confirms the presence of severe marked diffuse global left ventricular and right ventricular myocardial uptake of tracer. ?There is normal rib and bone uptake.  ?  Two hour heart/contralateral lung (H/CL) ratio: ?1.977?this metric is abnormal and highly suggestive of ATTR cardiac  amyloidosis.?  ?  Visual Semi-Quantitative Grading Scale Analysis:?  The study is grade 3 indicating myocardial uptake greater than rib and bone uptake. ?The pattern is highly and strongly suggestive of ATTR cardiac amyloidosis.  ?  SUMMARY/OPINION:   This study is abnormal and strongly suggestive of ATTR cardiac amyloidosis. ?Left ventricular myocardial uptake is diffuse, intense, and global. ?There is also prominent right ventricular free wall uptake of tracer. ?The pattern is typical for ATTR cardiac amyloidosis   ? Cardiac MRI ?   07/25/2019 Kappa/Lambda FLC ?  ? Ref. Range 07/25/2019 15:48   Kappa, FLC Latest Ref Range: 0.33 - 1.94 MG/DL 4.54 (H)   Lambda, FLC Latest Ref Range: 0.57 - 2.63 MG/DL 0.98 (H)   Kappa/Lambda FLC Latest Ref Range: 0.26 - 1.65  1.83 (H)      07/25/2019 Serum Immunofixation ?NO PARAPROTEIN SEEN   cancelled Urine Immunofixation ?Patient unable to provide urine sample 4/26    07/25/2019 Genetic Testing ?Negative   ? Biopsy (Fat Pad, Cardiac, Bone Marrow) ?   ? GI Symptoms ?   ?+? 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   ?  ? Ref. Range 07/25/2019 15:48   B Type Natriuretic Peptide Latest Ref Range: 0 - 100 PG/ML 966.0 (H)   Troponin-I Latest Ref Range: 0.0 - 0.05 NG/ML 0.05   ?  Completed:  -Tafamidis start 08/2019. PA approved, pt approved for [employer] patient assistance from 09/01/2019 to 03/30/2020  -Baseline KCCQ12 completed 5/18/202, Summary Score 61.46  -Baseline 6 min walk completed 08/16/2019, distance walked 950 feet, 289.75 meters, duration of test 6 minutes  -Baseline CPET completed 10/17/2019       ? Asymptomatic microscopic hematuria 12/25/2019   ? S/P left pulmonary artery pressure sensor implant placement 12/15/2019     *Patient has CardioMEMS (Pulmonary Artery Pressure Sensor)* Please call Ray [name], CardioMEMs Program Coordinator [phone] or page Heart Failure Rounding Team if patient is admitted or presents to ED*          ? Chronic heart failure with preserved ejection fraction (HCC) 12/15/2019   ? Acute on chronic heart failure with preserved ejection fraction (HFpEF) (HCC) 11/14/2019   ? S/P ablation of atrial fibrillation 05/18/2018   ? Lightheadedness 03/16/2018   ? Hospitalization within last 30 days 12/24/2017   ? Atrial fibrillation with RVR (HCC) 12/14/2017   ? Chronic kidney disease, stage 4 (severe) (HCC) 12/13/2017   ? Cardiogenic shock (HCC) 12/12/2017   ? Bradycardia 12/12/2017   ? Bilateral leg edema 12/11/2017   ? On amiodarone therapy 12/11/2017   ? Heart failure with preserved ejection fraction (HCC) 01/01/2016   ? NSVT (nonsustained ventricular tachycardia) (HCC) 06/12/2015   ? Medication side effects 11/09/2014   ? Heart palpitations 09/19/2014   ? Longstanding persistent atrial fibrillation (HCC) 03/17/2013     03/26/2018 - ECHO:  Moderate concentric LVH seen. The EF is about 55%.  The atria are slightly dilated.  The RV function is mildly decreased.  Mild to moderate MR and TR seen.  Mild AI is present.  Based on the gradients and appearance, the AS appears to be mild. The mean gradient is 16 mm Hg with a peak velocity of 2.6 m/s.  03/26/2018 - Zio Patch:  Predominant rhythm was normal sinus.  Two runs of ventricular tachycardia occurred,  they were monomorphic, the longest run lasted 3 seconds, this episode occurred on April 08, 2018 at 9:41 PM.  One episode of AIVR also appeared to be present, it lasted approximately 4.6 seconds  Brief and rare episodes of SVT that probably represent atrial fibrillation, the longest lasting 6.3 seconds.  No evidence of atrial flutter and no evidence of high degree AV block.  05/12/2018 - LAAA:  Persistent Atrial Fibrillation status post successful pulmonary vein antral isolation.  Cavotricuspid Isthmus Ablation.  Ablation of Fractionated Intracardiac Electrograms.  Ablation for an Additional Discrete Arrhythmia-LA AFL after AFIB/PVI Ablation  06/23/2018 - Cardioversion:  Successful direct current cardioversion from symptomatic atrial fibrillation to sinus rhythm.  03/16/2019 - DCCV:  Successful direct current cardioversion from atrial fibrillation to sinus rhythm.     ? Systolic murmur of aorta 02/04/2013   ? SOB (shortness of breath) 02/04/2013   ? Carotid bruit present 01/23/2012   ? Aortic stenosis, mild 01/30/2011   ? Overweight (BMI 25.0-29.9) 01/30/2011   ? Essential hypertension 01/30/2009   ? Hyperlipidemia 01/30/2009     Hyperlipidemia, on statin therapy     ? Skin cancer 01/30/2009   ? Seasonal allergic reaction 01/30/2009         Review of Systems   Constitutional: Negative.   HENT: Negative.    Eyes: Negative. Cardiovascular: Negative.    Respiratory: Negative.    Endocrine: Negative.    Hematologic/Lymphatic: Negative.    Skin: Negative.    Musculoskeletal: Negative.    Gastrointestinal: Positive for constipation.   Genitourinary: Negative.    Neurological: Negative.    Psychiatric/Behavioral: Negative.    Allergic/Immunologic: Negative.        Physical Exam  General Appearance: In NAD.  Right eye is bloodshot.  Well-healed scar on forehead.  Neck Veins: 9 cm JVP, neck veins are distended, + HJR   Chest Inspection: chest is normal in appearance   Respiratory Effort: breathing comfortably, no respiratory distress   Auscultation/Percussion: lungs clear to auscultation, no rales, rhonchi or wheezing   Cardiac Rhythm: irregularly irregular rhythm and normal rate   Cardiac Auscultation: S1, S2 normal, no rub, no definite S3  or S4   Murmurs: grade iii/vi systolic murmur   Peripheral Circulation: normal peripheral circulation   Pedal Pulses: normal symmetric pedal pulses   Lower Extremity Edema: Trace right ankle, bilateral lower legs have venous discoloration.  The left leg is wrapped from the knee distal to the ankle.  Aggie Cosier has showed me initial pictures of the wound, she reports it appears to be healing.2.  Abdominal Exam: soft, non-tender, no obvious masses, bowel sounds normal   Gait & Station: walks without assistance   Orientation: oriented to person, place and time   Affect & Mood: appropriate and sustained affect   Language and Memory: patient responsive and seems to comprehend information   Neurologic Exam: neurological assessment grossly intact   Vital signs were reviewed  Lower back incision dressing removed and replaced, moh's procedure tunneled    Cardiovascular Studies: echo 09/06/2019  ? The underlying rhythm is atrial fibrillation.  ? Normal left ventricular systolic function, estimated ejection fraction is 55%.  ? Severe concentric LVH, patient has known amyloidosis.  ? Probably severe diastolic dysfunction, elevated filling pressure.  ? The right ventricle is dilated, hypertrophy of the free wall, decreased systolic function.M-Mode TAPSE 1.2 cm (normal >1.7 cm).  ? Biatrial dilatation (LA-mildly dilated, RA-moderately dilated).  ? Moderate mitral valve regurgitation, moderate to severe tricuspid valve regurgitation.  ?  Severely sclerotic aortic valve, by spectral Doppler calculations mild stenosis (MG= 12 mmHg, peak velocity= 2.3 m/sec, DI= 0.27 , AVA= 1.1 cm?), trace regurgitation. By 2D echo exam only, the aortic valve systolic opening is significantly restricted, this finding is compatible with severe aortic valve stenosis. (NF-LG severe AS).  ? Estimated Peak Systolic PA Pressure 31 mmHg      6 Minute Walk Last Results 06/26/2020 08/16/2019   SpO2:  Pre-Activity 99 99   Pulse:  POST-Activity 94 122   BP:  POST-Activity 126/76 151/76   SpO2:  POST-Activity 99 96   Distance Walked in Meters 152.5 289.75       Problems Addressed Today  Encounter Diagnoses   Name Primary?   ? Aortic stenosis, mild Yes       Assessment and Plan   Wild-type amyloidosis:   -He has had a previous technetium pyrophosphate scan on 06/28/2019 that showed a 2-hour heart/contralateral lung ratio of 1.97 with visual semiquantitative grading grade 3, abnormal and highly suggestive of ATTR cardiac amyloidosis.   - He has had kappa 7.45, lambda 4.07 and free light chain ratio 1.83 on 4/26.    -He had serum electrophoresis that showed no paraprotein.   - He did a 6-minute walk test on 5/18, he was able to walk 289.75 m.    -He has completed a [facility] Q score that was 72.92 indicative of fair to good quality of life.    -He is on tafamidis 61 mg daily.    -He had a baseline CPET on 7/19 2021, will repeat this in July..    -He had repeat TC PYP on 06/11/2020, it showed 2-hour heart/contralateral lung ratio reduced to 1.7, visual semiquantitative grading scale remains at 3.  -Follow-up appointment with Dr. [name] July 14 but I am requesting 1st available appt with Dr. [name] when patient returns from vacation.[name]       Heart failure with preserved ejection fraction:  - He appears euvolemic on physical exam on  Bumex 4 mg p.o. twice daily and he also takes metolazone 5 mg 3 times weekly weekly.[name] He thinks his weight is up 3# from baseline.  -Will increase the bumex to 5 mg BID.  -We will plan to get a repeat echo this afternoon, he has complex decision making, he had POC labs that showed hypokalemia, hyponatremia, his creatinine remains elevated (however BUN has reduced compared to last labs).  -I am concerned that he may have worsening cardiac indices, he and Aggie Cosier have already bought tickets to [place].  I will ask or repeat CPET when they return from [place].  If his CPET results are worse, will plan to repeat the RHC.      CardioMEMS in situ: -12/15/19 IMPLANT RHC:?  BP:?126/76 (99)  RA:?27  RV:?60/32  PA:?57/31 (42)  PCWP:?30  TPG:?12  PVR:?3.8  SVR:?1840  CO (Fick):?3.1  CI (Fick):?1.7?  AVO2 Diff 7.18?  Daily CardioMEMS Readings  ?Goal PA Diastolic:  22  PA Diastolic Thresholds:  20-24  His PA diastolic today is 22, yesterday was 29, above target.  He will need to increase the bumex to 5 mg BID.and, will plan to have him take metolazone 2. 5 mg on MondaysWednesdays.and Fridays.      Hypokalemia: he is currently taking kdur 20 meq TID and taking extra 20 meq on MWF with metolazone.  Will increase kdur to 40 meq BID and 20 meq daily; increase kdur to 40 meq on MWF.  -Repeat chemistry in 1 week.  Aortic stenosis: The echo today shows worsening aortic stenosis, likely representing low flow low-gradient aortic stenosis.  -Will send referral for Valve Clinic.     CKD: His creatinine on today was 2.8, he last saw Dr. Mercie Eon on 3/ 31   Atrial fibrillation: He continues to take amiodarone 200 mg p.o.daily, he is on oral anticoagulation with Xarelto 15 mg daily, he denies any upper or lower GI bleeding symptoms.  He has been in atrial fibrillation since at least 2020, has had previous ablations.        have personally documented the HPI, exam and medical decision making.  Patient education: I reviewed recent lab results and current medications, medication instructions, discussed heart failure signs & symptoms,  low  sodium diet, fluid restriction and daily weights.I have instructed the patient on the plan of care and they verbalize understanding of the plan. Please see AVS for full patient teaching. Patient advised to call our office if s/he has any problems, questions, worsening symptoms, or concerns prior to the next appointment.   Thanks for allowing me to see this nice patient. If I can be of additional assistance, please don't hesitate to contact me.     [name] AGPCNP-BC, [facility]  Pager [phone]  Collaborating physician: Dr. Vanetta Shawl       Total Time Today was 70 minutes in the following activities: Preparing to see the patient, Obtaining and/or reviewing separately obtained history, Performing a medically appropriate examination and/or evaluation, Counseling and educating the patient/family/caregiver, Ordering medications, tests, or procedures, Referring and communication with other health care professionals (when not separately reported), Documenting clinical information in the electronic or other health record, Independently interpreting results (not separately reported) and communicating results to the patient/family/caregiver and Care coordination (not separately reported)         Encounter Diagnoses   Name Primary?   ? Heart failure with preserved ejection fraction, unspecified HF chronicity (HCC)    ? Other heart disorders in diseases classified elsewhere                         Current Medications (including today's revisions)  ? amiodarone (CORDARONE) 200 mg tablet Take one tablet by mouth daily.   ? bumetanide (BUMEX) 2 mg tablet Take 2.5 tablets by mouth twice daily.   ? cholecalciferol (vitamin D3) (VITAMIN D3 PO) Take 1 tablet by mouth daily. Pt. Unsure of dosage   ? docusate (COLACE) 100 mg capsule Take 100 mg by mouth daily.   ? esomeprazole DR(+) (NEXIUM) 20 mg capsule Take 20 mg by mouth daily. Take on an empty stomach at least 1 hour before or 2 hours after food.   ? ferrous sulfate (FEOSOL) 325 mg (65 mg iron) tablet Take 325 mg by mouth twice daily. Take on an empty stomach at least 1 hour before or 2 hours after food.    ? fluticasone/salmeterol (ADVAIR) 100/50 mcg inhalation disk Inhale 1 puff by mouth into the lungs daily.   ? lactobacillus rhamnosus (GG) (CULTURELLE) 10 billion cell cap Take 1 capsule by mouth daily with breakfast.   ? magnesium oxide (MAG-OX) 400 mg (241.3 mg magnesium) tablet Take one tablet by mouth daily.   ? metOLazone (ZAROXOLYN) 2.5 mg tablet Take one tablet by mouth three times weekly.   ? polyethylene glycol 3350 (MIRALAX) 17 g packet Take one packet by mouth daily.   ? potassium chloride SR (KLOR-CON M20) 20 mEq tablet . Take potassium 40 meq daily  am 8 am and 40 meq daily 12pm, take 20 meq daily evening. Take additional 40 meq potassium Mon-Wed-Fri on Metolazone days   ? rivaroxaban (XARELTO) 15 mg tablet Take one tablet by mouth daily with breakfast. Take with food. Hold the day before and the morning of your CardioMEMS procedure.   ? rosuvastatin (CRESTOR) 5 mg tablet Take one tablet by mouth three times weekly.   ? senna/docusate (SENOKOT-S) 8.6/50 mg tablet Take one tablet by mouth as Needed.   ? sildenafiL (VIAGRA) 100 mg tablet TAKE 1 TABLET BY MOUTH ONCE DAILY AS NEEDED FOR SEXUAL ACTIVITY   ? tafamidis (VYNDAMAX) 61 mg capsule Take one capsule by mouth daily.   ? VENTOLIN HFA 90 mcg/actuation inhaler Inhale 1 puff by mouth into the lungs as Needed.

## 2020-08-07 NOTE — Progress Notes
Daily CardioMEMS Readings    Goal PA Diastolic: 22 mmHg  PA Diastolic Thresholds: 98-33 mmHg

## 2020-08-08 ENCOUNTER — Encounter: Admit: 2020-08-08 | Discharge: 2020-08-08 | Payer: MEDICARE

## 2020-08-08 NOTE — Telephone Encounter
Pt asks about trip to [place] on Friday is it ok to go.  Per DM pt needs to be aware SOA may increase especially in the airplane, unable for [place] to determine.  If pt experiences increased SOA, chest pain to go the ER in [facility].  Wife verb understanding appt with Dr Manuella Ghazi made, advised wife valve clinic will contact them for an appt.  Wife verb understanding

## 2020-08-14 ENCOUNTER — Encounter: Admit: 2020-08-14 | Discharge: 2020-08-14 | Payer: MEDICARE

## 2020-08-15 ENCOUNTER — Encounter: Admit: 2020-08-15 | Discharge: 2020-08-15 | Payer: MEDICARE

## 2020-08-15 DIAGNOSIS — Z20822 Encounter for screening laboratory testing for COVID-19 virus in asymptomatic patient: Secondary | ICD-10-CM

## 2020-08-15 DIAGNOSIS — I5030 Unspecified diastolic (congestive) heart failure: Secondary | ICD-10-CM

## 2020-08-15 LAB — BASIC METABOLIC PANEL
BLD UREA NITROGEN: 79 — ABNORMAL HIGH
CALCIUM: 9 (ref 8.6–10.2)
CHLORIDE: 87 — ABNORMAL LOW (ref 96–106)
CO2: 24 (ref 20–29)
CREATININE: 1.9 — ABNORMAL HIGH (ref 0.76–1.27)
GFR ESTIMATED: 34 — ABNORMAL LOW (ref >59)
GLUCOSE,PANEL: 244 — ABNORMAL HIGH (ref 65–99)
POTASSIUM: 3.1 — ABNORMAL LOW (ref 3.5–5.2)
SODIUM: 132 — ABNORMAL LOW (ref 134–144)

## 2020-08-16 ENCOUNTER — Encounter: Admit: 2020-08-16 | Discharge: 2020-08-16 | Payer: MEDICARE

## 2020-08-16 MED ORDER — POTASSIUM CHLORIDE 20 MEQ PO TBTQ
60 meq | ORAL_TABLET | Freq: Three times a day (TID) | ORAL | 3 refills | 30.00000 days | Status: AC
Start: 2020-08-16 — End: ?

## 2020-08-17 ENCOUNTER — Encounter: Admit: 2020-08-17 | Discharge: 2020-08-17 | Payer: MEDICARE

## 2020-08-17 NOTE — Telephone Encounter
-----   Message from Marval Regal, RN sent at 08/15/2020 11:29 AM CDT -----  Regarding: valve clinic appt 6/14  Hi Elaf Clauson,    Quick follow-up note to let you know I have spoken with patient's spouse. We tried to schedule patient in valve clinic next week (5/24) but they won't quite be back from [place]. He is scheduled 6/14 to meet with Dr. Azzie Glatter and Dr. Lorriane Shire to talk about the AS.     Thank-you!    Sam, RN  Structural heart nurse navigator    From: Raynelle Jan, BSN   Sent: 08/07/2020  5:09 PM CDT   To: Debbe Odea, RN   Subject: valve clinic ref                   Marnette Burgess,     I have placed a Valve Clinic ref for [name]     ECHO today 5/10     Interpretation Summary     1. Normal LV size and systolic function. LVEF 55-65%. Severe LVH.   2. Mildly dilated RV with reduced RV systolic function.   3. Mild to moderate bi-atrial dilatation.   4. Moderate to severe mitral and tricuspid regurgitation.   5. Aortic valve is calcified. Aortic valve leaflets appear to be severely restricted. MG 26 mmHG, PG 42 mmHg, AVA 0.6cm2, Vmax 3.3 m/sec. This may represent severe low flow, low gradient aortic valve stenosis.    6. Estimated peak systolic PA pressure of 46 mmHg      Compared to echo from last year, gradients across the aortic valve are higher. PA pressure is higher.        Thank you   Jonelle Sidle

## 2020-08-20 ENCOUNTER — Encounter: Admit: 2020-08-20 | Discharge: 2020-08-20 | Payer: MEDICARE

## 2020-08-20 NOTE — Telephone Encounter
Ray Running, APRN-NP  [facility] Nurse [facility] [place] 1 hour ago (12:39 PM)       Keep his extra 20 meq on metolazone days. DM    Message text         Ray Running, APRN-NP  [facility] [place] 3 days ago       Please have him increase potassium to 40 M EQ p.o. 3 times daily And get a repeat chemistry in 1 week. Thanks, DM.    Message text

## 2020-08-22 ENCOUNTER — Encounter: Admit: 2020-08-22 | Discharge: 2020-08-22 | Payer: MEDICARE

## 2020-08-23 ENCOUNTER — Encounter: Admit: 2020-08-23 | Discharge: 2020-08-23 | Payer: MEDICARE

## 2020-08-23 DIAGNOSIS — I5030 Unspecified diastolic (congestive) heart failure: Secondary | ICD-10-CM

## 2020-08-23 DIAGNOSIS — I5032 Chronic diastolic (congestive) heart failure: Secondary | ICD-10-CM

## 2020-08-23 MED ORDER — JARDIANCE 10 MG PO TAB
10 mg | ORAL_TABLET | Freq: Every day | ORAL | 3 refills | Status: AC
Start: 2020-08-23 — End: ?

## 2020-08-23 NOTE — Telephone Encounter
Lorraine Lax, RN; Bartholomew Boards, MD  Caller: Unspecified (Today, 9:31 AM)    Let's hold off on the Jardiance with this new finding of cellulitis (wait until the cellulitis has calmed down). I know his mems is high, I just want to be sure his K is >3.5 before increasing diuretics. Thanks, DM

## 2020-08-23 NOTE — Telephone Encounter
Spoke with pt's wife. She reports the patient is not feeling well. Pt returned home from [place] yesterday and had blood drawn this morning in [facility]. She reports 2 days into their road trip to [place] the patients left thumb began to throb/red and swollen. By the 3rd day, the reddness and swelling engulfed his entire left hand. It is extremely painful. Pt was given doxycycline by APRN 5/25 but seeing PCP for it today. Asked wife to let us know what PCP says.    Wife reports pt's weight up approx 4 lbs during trip to [place]. Home weight this morning was 146lbs. She states his weight usually runs approx 142  lbs at home. She denies patient having any increase in shortness of breath, swelling(other that left hand), orthopnea or PND.  Discuss [name], APRN's recommendations to start Jardiance 10mg  daily. Asked spouse to call us if script was unaffordable. She verbalizes understanding.  Instructed to call office if concerns of increase in hypervolemia symptoms. She is agreeable to plan.

## 2020-08-24 ENCOUNTER — Encounter: Admit: 2020-08-24 | Discharge: 2020-08-24 | Payer: MEDICARE

## 2020-08-29 ENCOUNTER — Ambulatory Visit: Admit: 2020-08-29 | Discharge: 2020-08-30 | Payer: MEDICARE

## 2020-08-29 ENCOUNTER — Encounter: Admit: 2020-08-29 | Discharge: 2020-08-29 | Payer: MEDICARE

## 2020-08-29 DIAGNOSIS — I10 Essential (primary) hypertension: Secondary | ICD-10-CM

## 2020-08-29 DIAGNOSIS — C4490 Unspecified malignant neoplasm of skin, unspecified: Secondary | ICD-10-CM

## 2020-08-29 DIAGNOSIS — E8582 Wild-type transthyretin-related (ATTR) amyloidosis: Secondary | ICD-10-CM

## 2020-08-29 DIAGNOSIS — I350 Nonrheumatic aortic (valve) stenosis: Secondary | ICD-10-CM

## 2020-08-29 DIAGNOSIS — I358 Other nonrheumatic aortic valve disorders: Secondary | ICD-10-CM

## 2020-08-29 DIAGNOSIS — I5032 Chronic diastolic (congestive) heart failure: Secondary | ICD-10-CM

## 2020-08-29 DIAGNOSIS — E785 Hyperlipidemia, unspecified: Secondary | ICD-10-CM

## 2020-08-29 DIAGNOSIS — J302 Other seasonal allergic rhinitis: Secondary | ICD-10-CM

## 2020-08-29 MED ORDER — POTASSIUM CHLORIDE 20 MEQ PO TBTQ
40 meq | ORAL_TABLET | Freq: Three times a day (TID) | ORAL | 3 refills | 30.00000 days | Status: AC
Start: 2020-08-29 — End: ?

## 2020-08-29 NOTE — Progress Notes
Date of Service: 08/29/2020    Ray [name] is a 85 y.o. male.       HPI  Had opportunity of seeing Ray [name] at [facility].  An exceptionally pleasant 85 year old gentleman who follows with [place] for wild-type T TR amyloidosis diagnosed in 2021.  Overall Ray [name] has been doing well.  We have repeated his PYP scan with marginal decline in his technetium pyrophosphate uptake.  His baseline cardiopulmonary exercise stress test done last year was decent.  I have repeated his echocardiogram on 08/07/2020.  Interpretation Summary  1. Normal LV size and systolic function. LVEF 55-65%. Severe LVH.   2. Mildly dilated RV with reduced RV systolic function.   3. Mild to moderate bi-atrial dilatation.   4. Moderate to severe mitral and tricuspid regurgitation.   5. Aortic valve is calcified. Aortic valve leaflets appear to be severely restricted. MG 26 mmHG, PG 42 mmHg, AVA 0.6cm2, Vmax 3.3 m/sec. This may represent severe low flow, low gradient aortic valve stenosis.    6. Estimated peak systolic PA pressure of 46 mmHg  ?Compared to echo from last year, gradients across the aortic valve are higher. PA pressure is higher.      His functional capacity continues to be decent.  Blood pressure today is 128/62 mmHg and heart rate 91 bpm.  He describes NYHA class II to class III symptoms.  He does have baseline CKD however renal function has been stable for some time.    Vitals:    08/29/20 1443   BP: 128/62   BP Source: Arm, Right Upper   Pulse: 91   SpO2: 99%   PainSc: Zero   Weight: 66.1 kg (145 lb 12.8 oz)   Height: 162.6 cm (5' 4)     Body mass index is 25.03 kg/m?[name]     Past Medical History  Patient Active Problem List    Diagnosis Date Noted   ? Asymptomatic microscopic hematuria 12/25/2019   ? S/P left pulmonary artery pressure sensor implant placement 12/15/2019     *Patient has CardioMEMS (Pulmonary Artery Pressure Sensor)* Please call [name], CardioMEMs Program Coordinator [phone] or page Heart Failure Rounding Team if patient is admitted or presents to ED*          ? Chronic heart failure with preserved ejection fraction (HCC) 12/15/2019   ? Acute on chronic heart failure with preserved ejection fraction (HFpEF) (HCC) 11/14/2019   ? 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  ?  01/26/2019 Echo EF:?60%  IVS 1.50 cm (Range: 0.6 - 1.0)         LV PW 1.40 cm (Range: 0.6 - 1.0)         ?  1. Moderate concentric left ventricular hypertrophy  2. Unable to assess diastolic function due to atrial fibrillation  3. Moderate biatrial dilatation  4. Normal right ventricular size and systolic function  5. Normal central venous pressure  6. Mild calcific aortic valve stenosis. ?Mean gradient is 16 mmHg with a peak velocity of 2.8 m/s. ?Trace aortic valve regurgitation is seen  7. Mitral valve is structurally okay. ?There is moderate central regurgitation. ?No stenosis  8. Moderate tricuspid valve regurgitation  9. Pulmonary artery static pressure measures at 33 mmHg   06/28/2019 TC PYP Multiview planar views and SPECT imaging confirms the presence of severe marked diffuse global left ventricular and right ventricular myocardial uptake of tracer. ?There is normal rib and bone  uptake.  ?  Two hour heart/contralateral lung (H/CL) ratio: ?1.977?this metric is abnormal and highly suggestive of ATTR cardiac amyloidosis.?  ?  Visual Semi-Quantitative Grading Scale Analysis:?  The study is grade 3 indicating myocardial uptake greater than rib and bone uptake. ?The pattern is highly and strongly suggestive of ATTR cardiac amyloidosis.  ?  SUMMARY/OPINION:   This study is abnormal and strongly suggestive of ATTR cardiac amyloidosis. ?Left ventricular myocardial uptake is diffuse, intense, and global. ?There is also prominent right ventricular free wall uptake of tracer. ?The pattern is typical for ATTR cardiac amyloidosis   ? Cardiac MRI ?   07/25/2019 Kappa/Lambda FLC ?  ? Ref. Range 07/25/2019 15:48   Kappa, FLC Latest Ref Range: 0.33 - 1.94 MG/DL 1.61 (H)   Lambda, FLC Latest Ref Range: 0.57 - 2.63 MG/DL 0.96 (H)   Kappa/Lambda FLC Latest Ref Range: 0.26 - 1.65  1.83 (H)      07/25/2019 Serum Immunofixation ?NO PARAPROTEIN SEEN   cancelled Urine Immunofixation ?Patient unable to provide urine sample 4/26    07/25/2019 Genetic Testing ?Negative   ? Biopsy (Fat Pad, Cardiac, Bone Marrow) ?   ? GI Symptoms ?   ?+? 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   ?  ? Ref. Range 07/25/2019 15:48   B Type Natriuretic Peptide Latest Ref Range: 0 - 100 PG/ML 966.0 (H)   Troponin-I Latest Ref Range: 0.0 - 0.05 NG/ML 0.05   ?  Completed:  -Tafamidis start 08/2019. PA approved, pt approved for [facility] patient assistance from 09/01/2019 to 03/30/2020  -Baseline KCCQ12 completed 5/18/202, Summary Score 61.46  -Baseline 6 min walk completed 08/16/2019, distance walked 950 feet, 289.75 meters, duration of test 6 minutes  -Baseline CPET completed 10/17/2019       ? S/P ablation of atrial fibrillation 05/18/2018   ? Lightheadedness 03/16/2018   ? Hospitalization within last 30 days 12/24/2017   ? Atrial fibrillation with RVR (HCC) 12/14/2017   ? Chronic kidney disease, stage 4 (severe) (HCC) 12/13/2017   ? Cardiogenic shock (HCC) 12/12/2017   ? Bradycardia 12/12/2017   ? Bilateral leg edema 12/11/2017   ? On amiodarone therapy 12/11/2017   ? Heart failure with preserved ejection fraction (HCC) 01/01/2016   ? NSVT (nonsustained ventricular tachycardia) (HCC) 06/12/2015   ? Medication side effects 11/09/2014   ? Heart palpitations 09/19/2014   ? Longstanding persistent atrial fibrillation (HCC) 03/17/2013     03/26/2018 - ECHO:  Moderate concentric LVH seen. The EF is about 55%.  The atria are slightly dilated.  The RV function is mildly decreased.  Mild to moderate MR and TR seen.  Mild AI is present.  Based on the gradients and appearance, the AS appears to be mild. The mean gradient is 16 mm Hg with a peak velocity of 2.6 m/s.  03/26/2018 - Zio Patch:  Predominant rhythm was normal sinus.  Two runs of ventricular tachycardia occurred, they were monomorphic, the longest run lasted 3 seconds, this episode occurred on April 08, 2018 at 9:41 PM.  One episode of AIVR also appeared to be present, it lasted approximately 4.6 seconds  Brief and rare episodes of SVT that probably represent atrial fibrillation, the longest lasting 6.3 seconds.  No evidence of atrial flutter and no evidence of high degree AV block.  05/12/2018 - LAAA:  Persistent Atrial Fibrillation status post successful pulmonary vein antral isolation.  Cavotricuspid Isthmus Ablation.  Ablation of Fractionated Intracardiac Electrograms.  Ablation for an Additional Discrete Arrhythmia-LA AFL after AFIB/PVI Ablation  06/23/2018 - Cardioversion:  Successful direct current cardioversion from symptomatic atrial fibrillation to sinus rhythm.  03/16/2019 - DCCV:  Successful direct current cardioversion from atrial fibrillation to sinus rhythm.     ? Systolic murmur of aorta 02/04/2013   ? SOB (shortness of breath) 02/04/2013   ? Carotid bruit present 01/23/2012   ? Aortic stenosis, mild 01/30/2011   ? Overweight (BMI 25.0-29.9) 01/30/2011   ? Essential hypertension 01/30/2009   ? Hyperlipidemia 01/30/2009     Hyperlipidemia, on statin therapy     ? Skin cancer 01/30/2009   ? Seasonal allergic reaction 01/30/2009         Review of Systems   Constitutional: Negative.   HENT: Negative.    Eyes: Negative.    Cardiovascular: Negative.    Respiratory: Negative.    Endocrine: Negative.    Hematologic/Lymphatic: Negative.    Skin: Negative.    Musculoskeletal: Negative.    Gastrointestinal: Negative.    Genitourinary: Negative. Neurological: Negative.    Psychiatric/Behavioral: Negative.    Allergic/Immunologic: Negative.        Physical Exam  General Appearance: resting comfortably, no acute distress  Skin: warm, moist, no evident rashes   Digits and Nails: no clubbing   Eyes: conjunctivae and lids normal, pupils are equal and round   Lips & Oral Mucosa: no pallor or cyanosis   Ear, Nose, Throat: No deformities   Neck veins: neck veins are flat, neck veins are not distended   Carotid Arteries: normal carotid upstroke bilaterally, no bruits   Chest Inspection: chest is normal in appearance   Lung Auscultation: lungs with normal breath sounds, no rhonchi, and and expiratory wheezes  PMI: PMI not enlarged or displaced   Cardiac Rhythm: regular rhythm and normal rate   Cardiac Auscultation: Normal S1 & S2, no S3 or S4, no rubs or clicks   Murmurs: no significant cardiac murmur appreciated   Abdominal Exam: soft, non-tender, no masses, bowel sounds normal   Pedal Pulses: normal symmetric pedal pulses   Lower Extremities: no lower extremity edema   Muscle Strength: grossly normal   Neurologic Exam: neurological assessment grossly intact   Orientation: oriented to time, place and person   Affect & Mood: appropriate and sustained affect    Cardiovascular Studies      Cardiovascular Health Factors  Vitals BP Readings from Last 3 Encounters:   08/29/20 128/62   08/07/20 128/58   07/09/20 113/64     Wt Readings from Last 3 Encounters:   08/29/20 66.1 kg (145 lb 12.8 oz)   08/07/20 66.7 kg (147 lb)   07/23/20 67 kg (147 lb 11.3 oz)     BMI Readings from Last 3 Encounters:   08/29/20 25.03 kg/m?   08/07/20 25.23 kg/m?   07/23/20 25.34 kg/m?      Smoking Social History     Tobacco Use   Smoking Status Never Smoker   Smokeless Tobacco Never Used      Lipid Profile Cholesterol   Date Value Ref Range Status   01/28/2018 148 (L) 150 - 200 Final     HDL   Date Value Ref Range Status   01/28/2018 60  Final     LDL   Date Value Ref Range Status   01/28/2018 71  Final  Triglycerides   Date Value Ref Range Status   01/28/2018 113  Final      Blood Sugar No results found for: HGBA1C  Glucose   Date Value Ref Range Status   08/23/2020 154 (H) 70 - 105 Final   08/14/2020 244 (H) 65 - 99 Final   08/07/2020 81 70 - 100 MG/DL Final     Glucose, POC   Date Value Ref Range Status   08/07/2020 105 (H) 70 - 100 MG/DL Final   46/96/2952 841 (H) 70 - 100 MG/DL Final   32/44/0102 97 70 - 100 MG/DL Final   72/53/6644 034 (H) 70 - 100 MG/DL Final          Problems Addressed Today  No diagnosis found.    Assessment and Plan    #1.  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  Continues to be on Bumex twice daily and metolazone 3 times a week.  He has CardioMEMS with slightly elevated filling pressures.  Will not make any changes in his medications at this time.  Continues to be on Jardiance 10 mg daily    #2.  Patient's echocardiogram is revealing moderate to severe mitral regurgitation.  Also indicating moderate aortic valve regurgitation with mean gradient of 26 mmHg's.  We will continue current medication and refer him to our structural team for consideration of mitral clip and TAVR.  Declines any angina or syncope.        60 minutes of time spent with patient and family.  Greater than 30 minutes of this time spent counseling regarding heart failure with reduced ejection fraction, medications and volume control.  Vanetta Shawl M.D  [facility] Cardiologist    Current Medications (including today's revisions)  ? allopurinoL (ZYLOPRIM) 300 mg tablet Take 300 mg by mouth daily. Take with food.   ? amiodarone (CORDARONE) 200 mg tablet Take one tablet by mouth daily.   ? bumetanide (BUMEX) 2 mg tablet Take 2.5 tablets by mouth twice daily.   ? cholecalciferol (vitamin D3) (VITAMIN D3 PO) Take 1 tablet by mouth daily. Pt. Unsure of dosage   ? docusate (COLACE) 100 mg capsule Take 100 mg by mouth daily.   ? empagliflozin (JARDIANCE) 10 mg tablet Take one tablet by mouth daily.   ? esomeprazole DR(+) (NEXIUM) 20 mg capsule Take 20 mg by mouth daily. Take on an empty stomach at least 1 hour before or 2 hours after food.   ? ferrous sulfate (FEOSOL) 325 mg (65 mg iron) tablet Take 325 mg by mouth twice daily. Take on an empty stomach at least 1 hour before or 2 hours after food.    ? fluticasone/salmeterol (ADVAIR) 100/50 mcg inhalation disk Inhale 1 puff by mouth into the lungs daily.   ? lactobacillus rhamnosus (GG) (CULTURELLE) 10 billion cell cap Take 1 capsule by mouth daily with breakfast.   ? magnesium oxide (MAG-OX) 400 mg (241.3 mg magnesium) tablet Take one tablet by mouth daily.   ? metOLazone (ZAROXOLYN) 2.5 mg tablet Take one tablet by mouth three times weekly.   ? polyethylene glycol 3350 (MIRALAX) 17 g packet Take one packet by mouth daily.   ? potassium chloride SR (KLOR-CON M20) 20 mEq tablet Take two tablets by mouth three times daily. Take an additional potassium 20 meq (1 tablet) on the days you take Metolazone (Mondays, Wednesdays, Fridays)   ? rivaroxaban (XARELTO) 15 mg tablet Take one tablet by mouth daily with breakfast. Take  with food. Hold the day before and the morning of your CardioMEMS procedure.   ? rosuvastatin (CRESTOR) 5 mg tablet Take one tablet by mouth three times weekly.   ? senna/docusate (SENOKOT-S) 8.6/50 mg tablet Take one tablet by mouth as Needed.   ? sildenafiL (VIAGRA) 100 mg tablet TAKE 1 TABLET BY MOUTH ONCE DAILY AS NEEDED FOR SEXUAL ACTIVITY   ? tafamidis (VYNDAMAX) 61 mg capsule Take one capsule by mouth daily.   ? VENTOLIN HFA 90 mcg/actuation inhaler Inhale 1 puff by mouth into the lungs as Needed.

## 2020-08-29 NOTE — Research Notes
Research Informed Consent Note    Name: Ray [name] MRN: [id number] DOB: [birth year] AGE: 85    Informed consent was obtained subsequent to receipt of the letter of approval from the [employer]. Informed consent was obtained prior to performance of any procedures as outlined in the study protocol.      A thorough screening of the participant's chart was completed to identify inclusion criteria per the study protocol and the participant met the criteria to the best of my knowledge. No exclusions to the study protocol were found and the participant met all the study enrollment requirements to the best of my knowledge.      Clinical research participation and research nature of the trial were discussed with subject. The details of the protocol were discussed with the participant. The participant was alert and oriented during consent discussion and was accompanied by his wife. The study purpose, procedures, risk/benefits and alternatives were discussed with the participant. The participant voiced understanding that participation is voluntary and that he could withdraw at any time for any reason by notifying the study team.      Time was given for the participant to read the consent form and ask questions. The participant's questions were answered to his satisfaction and the participant signed the consent form. The participant verbalized full understanding of all the details of the consent and  signed consent without coercion and undue influence.     A copy of the signed consent form was placed in the chart and a copy was given to the participant for future reference and contacts regarding participation. The signed consent was emailed [facility] Information Management (HIM) for scanning into the subject's medical record in Epic O2.  The consent form discussed above was approved by the [facility] Human Subjects Committee and [facility]        Study: [place] [id number]  Sponsor: [employer]  PI: Vanetta Shawl, MD   Coordinator:  Haze Boyden, RN  HSC#: [id number]  ICF signed: 01/JUN/2022  Consent Approval Dates: 08/DEC/2021

## 2020-09-06 MED FILL — VYNDAMAX 61 MG PO CAP: 61 mg | ORAL | 30 days supply | Qty: 30 | Fill #2 | Status: AC

## 2020-09-09 ENCOUNTER — Encounter: Admit: 2020-09-09 | Discharge: 2020-09-09 | Payer: MEDICARE

## 2020-09-10 ENCOUNTER — Encounter: Admit: 2020-09-10 | Discharge: 2020-09-10 | Payer: MEDICARE

## 2020-09-11 ENCOUNTER — Encounter: Admit: 2020-09-11 | Discharge: 2020-09-11 | Payer: MEDICARE

## 2020-09-11 ENCOUNTER — Ambulatory Visit: Admit: 2020-09-11 | Discharge: 2020-09-11 | Payer: MEDICARE

## 2020-09-11 DIAGNOSIS — I10 Essential (primary) hypertension: Principal | ICD-10-CM

## 2020-09-11 DIAGNOSIS — I358 Other nonrheumatic aortic valve disorders: Secondary | ICD-10-CM

## 2020-09-11 DIAGNOSIS — I340 Nonrheumatic mitral (valve) insufficiency: Secondary | ICD-10-CM

## 2020-09-11 DIAGNOSIS — E785 Hyperlipidemia, unspecified: Secondary | ICD-10-CM

## 2020-09-11 DIAGNOSIS — I350 Nonrheumatic aortic (valve) stenosis: Secondary | ICD-10-CM

## 2020-09-11 DIAGNOSIS — C4490 Unspecified malignant neoplasm of skin, unspecified: Secondary | ICD-10-CM

## 2020-09-11 DIAGNOSIS — J302 Other seasonal allergic rhinitis: Secondary | ICD-10-CM

## 2020-09-11 DIAGNOSIS — I4811 Longstanding persistent atrial fibrillation: Secondary | ICD-10-CM

## 2020-09-11 DIAGNOSIS — E8582 Wild-type transthyretin-related (ATTR) amyloidosis: Secondary | ICD-10-CM

## 2020-09-11 DIAGNOSIS — I361 Nonrheumatic tricuspid (valve) insufficiency: Secondary | ICD-10-CM

## 2020-09-11 DIAGNOSIS — I5032 Chronic diastolic (congestive) heart failure: Secondary | ICD-10-CM

## 2020-09-11 DIAGNOSIS — I5030 Unspecified diastolic (congestive) heart failure: Secondary | ICD-10-CM

## 2020-09-11 DIAGNOSIS — Z01818 Encounter for other preprocedural examination: Secondary | ICD-10-CM

## 2020-09-11 LAB — CBC
HEMATOCRIT: 36 % — ABNORMAL LOW (ref 40–50)
HEMOGLOBIN: 12 g/dL — ABNORMAL LOW (ref 13.5–16.5)
MCH: 32 pg (ref 26–34)
MCHC: 33 g/dL (ref 32.0–36.0)
MCV: 96 FL (ref 80–100)
MPV: 8.7 FL (ref 7–11)
PLATELET COUNT: 171 K/UL (ref 150–400)
RBC COUNT: 3.8 M/UL — ABNORMAL LOW (ref 4.4–5.5)
RDW: 15 % — ABNORMAL HIGH (ref 11–15)
WBC COUNT: 6.5 K/UL (ref 4.5–11.0)

## 2020-09-11 MED ORDER — ALUMINUM-MAGNESIUM HYDROXIDE 200-200 MG/5 ML PO SUSP
30 mL | ORAL | 0 refills | PRN
Start: 2020-09-11 — End: ?

## 2020-09-11 MED ORDER — ACETAMINOPHEN 325 MG PO TAB
650 mg | ORAL | 0 refills | PRN
Start: 2020-09-11 — End: ?

## 2020-09-11 MED ORDER — ASPIRIN 325 MG PO TAB
325 mg | Freq: Once | ORAL | 0 refills
Start: 2020-09-11 — End: ?

## 2020-09-11 MED ORDER — NITROGLYCERIN 0.4 MG SL SUBL
.4 mg | SUBLINGUAL | 0 refills | PRN
Start: 2020-09-11 — End: ?

## 2020-09-11 MED ORDER — TEMAZEPAM 15 MG PO CAP
15 mg | Freq: Every evening | ORAL | 0 refills | PRN
Start: 2020-09-11 — End: ?

## 2020-09-11 MED ORDER — DOCUSATE SODIUM 100 MG PO CAP
100 mg | Freq: Every day | ORAL | 0 refills | PRN
Start: 2020-09-11 — End: ?

## 2020-09-11 NOTE — Progress Notes
Date of Service: 09/11/2020       Subjective:             Ray [name] is a 85 y.o. male.      History of Present Illness  We had the pleasure of seeing Ray [name] in the valve clinic for further management of aortic stenosis.  He is a very pleasant 85 year old male with progressive aortic stenosis, wild-type ATTR amyloidosis which was diagnosed in 2021, moderate mitral regurgitation, moderate tricuspid regurgitation, hypertension, hyperlipidemia, chronic diastolic heart failure, nonsustained ventricular tachycardia, CKD, and persistent atrial fibrillation with previous A. fib ablation.  He follows with Dr. Vanetta Shawl and is currently on Tafamadis.  Repeat PYP scan revealed a marginal decline in his technetium pyrophosphate uptake.  He follows with Dr. Mercie Eon for management of his renal disease.  His most recent creatinine in our system was 2.85.    He recently underwent echo doppler on 08/07/20 which revealed:  1. Normal LV size and systolic function. LVEF 55-65%. Severe LVH.   2. Mildly dilated RV with reduced RV systolic function.   3. Mild to moderate bi-atrial dilatation.   4. Moderate to severe mitral and tricuspid regurgitation.   5. Aortic valve is calcified. Aortic valve leaflets appear to be severely restricted. MG 26 mmHG, PG 42 mmHg, AVA 0.6cm2, Vmax 3.3 m/sec. This may represent severe low flow, low gradient aortic valve stenosis. ?  6. Estimated peak systolic PA pressure of 46 mmHg    Today he reports progressive shortness of breath mostly noted with exertion over the last month.  He notes a significant change in his breathing with stairs and has noticed increasing fatigue and activity intolerance.  He remains very active and does walk for approximately 1 hour daily with his wife.  He also is a very avid gardener and has various other hobbies including making sausage and wine.  Over the last few weeks he has noticed increasing lower extremity edema and about a 5 pound weight gain.  He denies orthopnea or PND.  He denies recent chest pain but does note some occasional palpitations with anxiety.  He denies significant dizziness, lightheadedness, near-syncope, or syncope.    STS risk assessment:   Isolated AVR  Risk of Mortality: 4.533%   Renal Failure: 22.332%   Permanent Stroke: 2.431%   Prolonged Ventilation: 13.062%   DSW Infection: 0.108%   Reoperation: 5.447%   Morbidity or Mortality: 33.023%   Short Length of Stay: 16.568%   Long Length of Stay: 14.936%       Review of Systems   Constitutional: Negative.   HENT: Negative.    Eyes: Negative.    Cardiovascular: Negative.    Respiratory: Positive for shortness of breath.    Endocrine: Negative.    Hematologic/Lymphatic: Negative.    Skin: Negative.    Musculoskeletal: Negative.    Gastrointestinal: Negative.    Genitourinary: Negative.    Neurological: Negative.    Psychiatric/Behavioral: Negative.    Allergic/Immunologic: Negative.          Objective:         ? allopurinoL (ZYLOPRIM) 300 mg tablet Take 300 mg by mouth daily. Take with food.   ? amiodarone (CORDARONE) 200 mg tablet Take one tablet by mouth daily.   ? bumetanide (BUMEX) 2 mg tablet Take 2.5 tablets by mouth twice daily.   ? cholecalciferol (vitamin D3) (VITAMIN D3 PO) Take 1 tablet by mouth daily. Pt. Unsure of dosage   ?  docusate (COLACE) 100 mg capsule Take 100 mg by mouth daily.   ? empagliflozin (JARDIANCE) 10 mg tablet Take one tablet by mouth daily.   ? esomeprazole DR(+) (NEXIUM) 20 mg capsule Take 20 mg by mouth daily. Take on an empty stomach at least 1 hour before or 2 hours after food.   ? ferrous sulfate (FEOSOL) 325 mg (65 mg iron) tablet Take 325 mg by mouth twice daily. Take on an empty stomach at least 1 hour before or 2 hours after food.    ? fluticasone/salmeterol (ADVAIR) 100/50 mcg inhalation disk Inhale 1 puff by mouth into the lungs daily.   ? lactobacillus rhamnosus (GG) (CULTURELLE) 10 billion cell cap Take 1 capsule by mouth daily with breakfast.   ? magnesium oxide (MAG-OX) 400 mg (241.3 mg magnesium) tablet Take one tablet by mouth daily.   ? polyethylene glycol 3350 (MIRALAX) 17 g packet Take one packet by mouth daily.   ? potassium chloride SR (KLOR-CON M20) 20 mEq tablet Take two tablets by mouth three times daily. Take an additional potassium 20 meq (1 tablet) on the days you take Metolazone (Mondays, Wednesdays, Fridays)   ? rivaroxaban (XARELTO) 15 mg tablet Take one tablet by mouth daily with breakfast. Take with food. Hold the day before and the morning of your CardioMEMS procedure.   ? rosuvastatin (CRESTOR) 5 mg tablet Take one tablet by mouth three times weekly.   ? senna/docusate (SENOKOT-S) 8.6/50 mg tablet Take one tablet by mouth as Needed.   ? sildenafiL (VIAGRA) 100 mg tablet TAKE 1 TABLET BY MOUTH ONCE DAILY AS NEEDED FOR SEXUAL ACTIVITY   ? tafamidis (VYNDAMAX) 61 mg capsule Take one capsule by mouth daily.   ? VENTOLIN HFA 90 mcg/actuation inhaler Inhale 1 puff by mouth into the lungs as Needed.     Vitals:    09/11/20 1010   BP: 130/70   BP Source: Arm, Right Upper   Pulse: 57   O2 Device: None (Room air)   PainSc: Zero   Weight: 69.4 kg (153 lb)   Height: 162.6 cm (5' 4)     Body mass index is 26.26 kg/m?[name]     Medical History:   Diagnosis Date   ? 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 Essential hypertension 01/30/2009   ? Hyperlipidemia 01/30/2009   ? Hypertension 01/30/2009   ? Nonrheumatic aortic valve stenosis 09/06/2020   ? Nonrheumatic mitral valve regurgitation 09/06/2020   ? Nonrheumatic tricuspid valve regurgitation 09/06/2020   ? Seasonal allergic reaction 01/30/2009   ? Skin cancer 01/30/2009     Surgical History:   Procedure Laterality Date   ? INTRACARDIAC CATHETER ABLATION WITH COMPREHENSIVE ELECTROPHYSIOLOGIC EVALUATION - ATRIAL FIBRILLATION N/A 05/12/2018    Performed by Jen Mow, MD at [facility] EP LAB   ? CATHETERIZATION RIGHT HEART WITH INSERTION PULMONARY ARTERY SENSOR N/A 12/15/2019    Performed by Harley Alto, MD at [facility] CATH LAB     Family History   Problem Relation Age of Onset   ? Stroke Father      Social History     Socioeconomic History   ? Marital status: Married   Tobacco Use   ? Smoking status: Never Smoker   ? Smokeless tobacco: Never Used   Vaping Use   ? Vaping Use: Never used   Substance and Sexual Activity   ? Alcohol use: Yes     Alcohol/week: 1.0 standard drink     Types: 1 Cans of beer per week  Comment: OCCASIONALLY   ? Drug use: No     Vaping/E-liquid Use   ? Vaping Use Never User                 Physical Exam  General Appearance: no acute distress  Skin: warm & intact  HEENT: unremarkable  Neck Veins: neck veins are flat & not distended  Carotid Arteries: no bruits  Chest Inspection: chest is normal in appearance  Auscultation/Percussion: lungs clear to auscultation, no rales, rhonchi, or wheezing  Cardiac Rhythm: regular rhythm & normal rate  Cardiac Auscultation: Normal S1 & S2, no S3 or S4, no rub  Murmurs: Harsh systolic murmur  Extremities: 1 + lower extremity edema  Abdominal Exam: soft, non-tender, no masses, bowel sounds normal  Liver & Spleen: no organomegaly  Neurologic Exam: oriented to time, place and person; no focal neurologic deficits  Psychiatric: Normal mood and affect.  Behavior is normal. Judgment and thought content normal.            Assessment and Plan:  Severe aortic stenosis  His imaging was reviewed by Dr. Helen Hashimoto and Dr. Paris Lore.  He does have evidence of severe aortic stenosis likely low-flow low gradient aortic stenosis.  They feel his anatomy is suitable for TAVR.  We will need cardiac catheterization as well as CTA's to complete his work-up.  Given his renal insufficiency we will plan to minimize contrast during the Procedure and will try to obtain CTAs the following day after IV hydration if his renal function will allow.    Mitral regurgitation  Tricuspid regurgitation  He has moderate mitral and tricuspid regurgitation which we will plan to follow closely.    Chronic diastolic heart failure  He has evidence of mild volume overload today on exam.  He describes NYHA class II heart failure symptoms.    Hypertension    Hyperlipidemia    Wild-type ATTR amyloidosis  This is managed by Dr. [name].  He is currently on tafamidis.    Longstanding persistent atrial fibrillation  He is on chronic anticoagulation with Xarelto.    CKD, stage III  His most recent creatinine in our system was 2.85.  As above we will plan to minimize contrast during his cath procedure and will plan for IV hydration prior to both cath as well as CTAs.    We appreciate the opportunity to participate in his care.    Dorena Cookey, APRN  Pager [phone]    I had the pleasure of meeting with him in the valve clinic today for review of his valvular disease.  He is a very pleasant 85 year old gentleman with progressive aortic stenosis, known cardiac amyloid, moderate mitral regurgitation, hypertension, hyperlipidemia, chronic renal insufficiency and persistent A. fib.    He is currently undergoing therapy for his amyloid.  His most recent creatinine was 2.8.    Echo on 08/07/2020 shows preserved left ventricular function with an EF of 55% and severe LVH.  He has a moderately dilated RV with reduced systolic function.  He has moderate mitral and tricuspid regurgitation.  He has a severely calcified aortic valve that almost appears immobile.  The gradient across it is 26 for a mean with a valve area 0.6 cm? and a peak velocity of 3.3 m/s.    Clinically he has decreased exercise tolerance and is much earlier to fatigue.  He denies chest pain.  He is an active individual at baseline.    He clearly has a severely stenotic aortic valve that needs  to be replaced.  He is a poor candidate for open surgery due to multiple comorbidities and his amyloid cardiomyopathy.    His evaluation may be limited in pace by his renal function.  He clearly needs cardiac catheterization as well as TAVR CTA.  Our plan would be to intervene on the aortic valve and evaluate his mitral and tricuspid function going forward.  I do think he may be a candidate for subsequent intervention if needed.    I reviewed the risks of TAVR including bleeding, infection, stroke, dialysis and death.  He has expressed understanding and is willing to proceed.  We will keep you updated on his progress.    Sindy Messing, M.D.

## 2020-09-11 NOTE — Patient Instructions
CARDIAC CATHETERIZATION   PRE-ADMISSION INSTRUCTIONS    Patient Name: Ray [name]  MRN#: [id number]  Date of Birth: [birth year] (84 y.o.)  Today's Date: 09/11/2020    PROCEDURE:  You are scheduled for a Coronary Angiogram with possible Angioplasty/Stenting with Dr. Idamae Lusher. Please plan on an overnight stay in the hospital.    PROCEDURE DATE AND ARRIVAL TIME:  Your procedure date is 09/17/20.  You will receive a call from the Cath lab staff between 8:00 a.m. and noon on the business day prior to your procedure to let you know at what time to arrive on the day of your procedure.    Please check in at the Admitting Desk in the [place] for your procedure. [facility] Entrance and and take a right. Continue down the hallway past the Cardiovascular Medicine office. That hall will take you into the [facility]. Check in at the desk on the left side.)     (If you have further questions regarding your arrival time for the CV lab, please call [phone] by 3:00pm the day before your procedure. Please leave a message with your name and number, your call will be returned in a timely manner.)    PRE-PROCEDURE APPOINTMENTS:    09/11/20   Office visit to update history and physical (requirement within 30 days of procedure)  with Dr. Idamae Lusher at Cardiovascular Medicine [facility]       09/10/20   Pre-Admission lab work required within 14 days of procedure: BMP and CBC at the lab of your choice.         FOOD AND DRINK INSTRUCTIONS  Nothing to eat after midnight before your procedure. No caffeine for 24 hours prior to your procedure. You will be under moderate sedation for your procedure.  You may drink clear liquids up to an hour before hospital arrival. This will be confirmed by the Cath lab staff the day before your procedure.     SPECIAL MEDICATION INSTRUCTIONS  Any new prescriptions will be sent to your pharmacy listed on file with [place].     Please either take 4 baby aspirins (4 times 81mg ) or one full strength NON-COATED 325mg  aspirin.  rivaroxaban (Xarelto): hold 1 days and AM of procedure. Last dose on 09/15/20.   Diuretics: Bumex (bumetanide) -- hold the morning of your procedure.      HOLD ALL over the counter vitamins or supplements on the morning of your procedure.      Additional Instructions  If you wear CPAP, please bring your mask and machine with you to the hospital.    Take a bath or shower with anti-bacterial soap the evening before, or the morning of the procedure.     Bring photo ID and your health insurance card(s).    Arrange for a driver to take you home from the hospital. Please arrange for a friend or family member to take you home from this test. You cannot take a Taxi, [employer], or public transportation as there has to be a responsible person to help care for you after sedation    Bring an accurate list of your current medications with you to the hospital (all medications and supplements taken daily).  Please use the medication list below and write in the date and time when you took your last dose before your procedure. Update this list of medications as needed.      Wear comfortable clothes and don't bring valuables, other than photo identification card, with you  to the hospital.    Please pack a bag for an overnight stay.     Please review your pre-procedure instructions and bring them with you on the day of your procedure.    Please reach out to valve clinic nurses, Kayte Borchard or [place] with questions: [phone].    ALLERGIES  Allergies   Allergen Reactions   ? Beta Blockers [Beta-Blockers (Beta-Adrenergic Blocking Agts)] SHORTNESS OF BREATH and SEE COMMENTS     Pt had beta blocker toxicity from one dose of carvedilol       CURRENT MEDICATIONS  Outpatient Encounter Medications as of 09/11/2020   Medication Sig Dispense Refill   ? allopurinoL (ZYLOPRIM) 300 mg tablet Take 300 mg by mouth daily. Take with food.     ? amiodarone (CORDARONE) 200 mg tablet Take one tablet by mouth daily. 90 tablet 1   ? bumetanide (BUMEX) 2 mg tablet Take 2.5 tablets by mouth twice daily. 250 tablet 3   ? cholecalciferol (vitamin D3) (VITAMIN D3 PO) Take 1 tablet by mouth daily. Pt. Unsure of dosage     ? docusate (COLACE) 100 mg capsule Take 100 mg by mouth daily.     ? empagliflozin (JARDIANCE) 10 mg tablet Take one tablet by mouth daily. 90 tablet 3   ? esomeprazole DR(+) (NEXIUM) 20 mg capsule Take 20 mg by mouth daily. Take on an empty stomach at least 1 hour before or 2 hours after food.     ? ferrous sulfate (FEOSOL) 325 mg (65 mg iron) tablet Take 325 mg by mouth twice daily. Take on an empty stomach at least 1 hour before or 2 hours after food.      ? fluticasone/salmeterol (ADVAIR) 100/50 mcg inhalation disk Inhale 1 puff by mouth into the lungs daily.     ? lactobacillus rhamnosus (GG) (CULTURELLE) 10 billion cell cap Take 1 capsule by mouth daily with breakfast.     ? magnesium oxide (MAG-OX) 400 mg (241.3 mg magnesium) tablet Take one tablet by mouth daily. 180 tablet 1   ? polyethylene glycol 3350 (MIRALAX) 17 g packet Take one packet by mouth daily. 12 each 1   ? potassium chloride SR (KLOR-CON M20) 20 mEq tablet Take two tablets by mouth three times daily. Take an additional potassium 20 meq (1 tablet) on the days you take Metolazone (Mondays, Wednesdays, Fridays) 150 tablet 3   ? rivaroxaban (XARELTO) 15 mg tablet Take one tablet by mouth daily with breakfast. Take with food. Hold the day before and the morning of your CardioMEMS procedure. 30 tablet 11   ? rosuvastatin (CRESTOR) 5 mg tablet Take one tablet by mouth three times weekly. 45 tablet 3   ? senna/docusate (SENOKOT-S) 8.6/50 mg tablet Take one tablet by mouth as Needed. 90 tablet 1   ? sildenafiL (VIAGRA) 100 mg tablet TAKE 1 TABLET BY MOUTH ONCE DAILY AS NEEDED FOR SEXUAL ACTIVITY     ? tafamidis (VYNDAMAX) 61 mg capsule Take one capsule by mouth daily. 30 capsule 11   ? VENTOLIN HFA 90 mcg/actuation inhaler Inhale 1 puff by mouth into the lungs as Needed.       No facility-administered encounter medications on file as of 09/11/2020.       _________________________________________  Form completed by: Cameron Sprang, RN  Date completed: 09/11/20  Method: In person and given to the patient.         Coronary Angiography  Angiography is a special type of moving X-ray that lets your doctor  view your coronary arteries to see if the blood vessels to your heart are narrowed or blocked. This test is done when someone is having a heart attack. Or it may be done if symptoms may mean a heart attack. It also may be done after an abnormal cardiac stress test.   ?Before the procedure   ? Tell your healthcare team what medicines you take and any allergies you may have.  ? Tell your healthcare team if you've had a reaction to contrast dye or have had any kidney problems.  ? Follow any directions you are given for not eating or drinking before surgery.  ? A nurse will place an IV (intravenous) catheter in your vein to give fluids, and medicine to relieve pain and help you feel less anxious.  ? They clean your skin and shave the area where the catheter will be inserted, if needed.    During the procedure   ? You will lie on a table with a portable X-ray machine over you. The team will place a surgical drape over your body. The area where the doctor chooses to insert the catheter will be cleaned. This will be either a wrist or the groin.  ? Your doctor will place a long, thin tube (catheter) inside an artery in your groin or arm and guide it into your heart. You may feel pressure with the insertion of the catheter. A numbing medicine often is injected at the insertion site. This eases discomfort during the procedure.  ? They will inject a contrast dye through the catheter into your blood vessels or heart chambers. You may feel a warm sensation or feeling like you have to urinate when the contrast is injected. This is normal.  ? X-rays are taken to show images of the inside of your heart and coronary arteries.     The catheter can be placed into the groin, arm, or wrist.     After the procedure   ? Your healthcare team will tell you how long to lie down and keep the insertion site still. The amount of time may depend on whether a closure device such as a stitch or collagen plug was used to close the opening made in your artery. The time you must be still may be shorter if one of these devices was used. The amount of time will also depend on if there is any bleeding at the catheter insertion site.  ? If the insertion site was in your groin, you may need to lie down with your leg still for several hours. If the insertion site was in your wrist, a pressure bandage may be put on the site. Or you may have closure device placed on the insertion site. It will be taken off when there is no sign of bleeding. If bleeding occurs, a nurse will put pressure on the area to control it.  ? A nurse will check your blood pressure and the insertion site often. This is to make sure you remain stable after the procedure.  ? You may be asked to drink fluid to help flush the contrast liquid out of your system.  ? Have someone drive you home from the hospital.  ? If your doctor uses angioplasty or a stent to treat a blocked artery, you may stay the night in the hospital. If there are multiple blockages that can't be fixed with a stent or angioplasty, you may need surgery to bypass the blockages. This is called coronary artery bypass  graft surgery. Your doctor will explain the results of your test and what treatment options that may be best for you.  ? It?s normal to find a small bruise or lump at the insertion site. The lump may be the collagen plug or stitch that you feel, or a small bruise. These common side effects should disappear within a few weeks.  ? You will be given instructions by your healthcare team on recovering from the coronary angiography. In general, don't lift anything heavier than a gallon of milk for several days. This gives time for the puncture site in the artery wall to heal. Try not to get the puncture site wet. Don't put it under water. Showers are OK. Don't soak in a bathtub, swimming pool, or hot tub until the skin has healed.    When to call your healthcare provider   Call your healthcare provider right away if you have any of these:   ? Symptoms of infection. These include pain, swelling, redness, bleeding, or drainage at the insertion site.  ? Fever of 100.4?F (38?C) or higher, or as advised by your provider  ? Bleeding, bruising, or a lot of swelling where the catheter was inserted  ? Blood in your urine  ? Black or tarry stools  ? Any unusual bleeding  ? Irregular, very slow, or fast heartbeat  ? Dizziness  Call 911  Call 911if any of these occur:     ? Chest pain  ? Shortness of breath  ? Sudden numbness or weakness in arms, legs, or face, or difficulty speaking  ? The puncture site swells up very fast  ? Bleeding from the puncture site that does not slow down with firm pressure  ? Severe or increasing pain, numbness, coldness, or a bluish color in the leg or arm that held the catheter    [employer] last reviewed this educational content on 05/01/2020    ? 2000-2022 [employer], [place]. All rights reserved. This information is not intended as a substitute for professional medical care. Always follow your healthcare professional's instructions.

## 2020-09-11 NOTE — Progress Notes
Date of Service: 09/11/2020    Ray [name] is a 85 y.o. male.       HPI     We had the pleasure of seeing Ray [name] in the valve clinic for further management of aortic stenosis.  He is a very pleasant 85 year old male with progressive aortic stenosis, wild-type ATTR amyloidosis which was diagnosed in 2021, moderate mitral regurgitation, moderate tricuspid regurgitation, hypertension, hyperlipidemia, chronic diastolic heart failure, nonsustained ventricular tachycardia, CKD, and persistent atrial fibrillation with previous A. fib ablation.  He follows with Ray [name] and is currently on Tafamadis.  Repeat PYP scan revealed a marginal decline in his technetium pyrophosphate uptake.  He follows with Ray [name] for management of his renal disease.  His most recent creatinine in our system was 2.85.    He recently underwent echo doppler on 08/07/20 which revealed:  1. Normal LV size and systolic function. LVEF 55-65%. Severe LVH.   2. Mildly dilated RV with reduced RV systolic function.   3. Mild to moderate bi-atrial dilatation.   4. Moderate to severe mitral and tricuspid regurgitation.   5. Aortic valve is calcified. Aortic valve leaflets appear to be severely restricted. MG 26 mmHG, PG 42 mmHg, AVA 0.6cm2, Vmax 3.3 m/sec. This may represent severe low flow, low gradient aortic valve stenosis. ?  6. Estimated peak systolic PA pressure of 46 mmHg    Today he reports progressive shortness of breath mostly noted with exertion over the last month.  He notes a significant change in his breathing with stairs and has noticed increasing fatigue and activity intolerance.  He remains very active and does walk for approximately 1 hour daily with his wife.  He also is a very avid gardener and has various other hobbies including making sausage and wine.  Over the last few weeks he has noticed increasing lower extremity edema and about a 5 pound weight gain.  He denies orthopnea or PND.  He denies recent chest pain but does note some occasional palpitations with anxiety.  He denies significant dizziness, lightheadedness, near-syncope, or syncope.    STS risk assessment:   Isolated AVR  Risk of Mortality: 4.533%   Renal Failure: 22.332%   Permanent Stroke: 2.431%   Prolonged Ventilation: 13.062%   DSW Infection: 0.108%   Reoperation: 5.447%   Morbidity or Mortality: 33.023%   Short Length of Stay: 16.568%   Long Length of Stay: 14.936%         Vitals:    09/11/20 0949   BP: 130/70   BP Source: Arm, Right Upper   Pulse: 57   PainSc: Zero   Weight: 69.4 kg (153 lb)   Height: 162.6 cm (5' 4)     Body mass index is 26.26 kg/m?[name]     Past Medical History  Patient Active Problem List    Diagnosis Date Noted   ? Nonrheumatic mitral valve regurgitation 09/06/2020   ? Nonrheumatic tricuspid valve regurgitation 09/06/2020   ? Nonrheumatic aortic valve stenosis 09/06/2020   ? Asymptomatic microscopic hematuria 12/25/2019   ? S/P left pulmonary artery pressure sensor implant placement 12/15/2019     *Patient has CardioMEMS (Pulmonary Artery Pressure Sensor)* Please call Ray [name], CardioMEMs Program Coordinator [phone] or page Heart Failure Rounding Team if patient is admitted or presents to ED*          ? Chronic heart failure with preserved ejection fraction (HCC) 12/15/2019   ? Acute on chronic heart failure with preserved  ejection fraction (HFpEF) (HCC) 11/14/2019   ? 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  ?  01/26/2019 Echo EF:?60%  IVS 1.50 cm (Range: 0.6 - 1.0)         LV PW 1.40 cm (Range: 0.6 - 1.0)         ?  1. Moderate concentric left ventricular hypertrophy  2. Unable to assess diastolic function due to atrial fibrillation  3. Moderate biatrial dilatation  4. Normal right ventricular size and systolic function  5. Normal central venous pressure  6. Mild calcific aortic valve stenosis. ?Mean gradient is 16 mmHg with a peak velocity of 2.8 m/s. ?Trace aortic valve regurgitation is seen  7. Mitral valve is structurally okay. ?There is moderate central regurgitation. ?No stenosis  8. Moderate tricuspid valve regurgitation  9. Pulmonary artery static pressure measures at 33 mmHg   06/28/2019 TC PYP Multiview planar views and SPECT imaging confirms the presence of severe marked diffuse global left ventricular and right ventricular myocardial uptake of tracer. ?There is normal rib and bone uptake.  ?  Two hour heart/contralateral lung (H/CL) ratio: ?1.977?this metric is abnormal and highly suggestive of ATTR cardiac amyloidosis.?  ?  Visual Semi-Quantitative Grading Scale Analysis:?  The study is grade 3 indicating myocardial uptake greater than rib and bone uptake. ?The pattern is highly and strongly suggestive of ATTR cardiac amyloidosis.  ?  SUMMARY/OPINION:   This study is abnormal and strongly suggestive of ATTR cardiac amyloidosis. ?Left ventricular myocardial uptake is diffuse, intense, and global. ?There is also prominent right ventricular free wall uptake of tracer. ?The pattern is typical for ATTR cardiac amyloidosis   ? Cardiac MRI ?   07/25/2019 Kappa/Lambda FLC ?  ? Ref. Range 07/25/2019 15:48   Kappa, FLC Latest Ref Range: 0.33 - 1.94 MG/DL 1.61 (H)   Lambda, FLC Latest Ref Range: 0.57 - 2.63 MG/DL 0.96 (H)   Kappa/Lambda FLC Latest Ref Range: 0.26 - 1.65  1.83 (H)      07/25/2019 Serum Immunofixation ?NO PARAPROTEIN SEEN   cancelled Urine Immunofixation ?Patient unable to provide urine sample 4/26    07/25/2019 Genetic Testing ?Negative   ? Biopsy (Fat Pad, Cardiac, Bone Marrow) ?   ? GI Symptoms ?   ?+? 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   ?  ? Ref. Range 07/25/2019 15:48   B Type Natriuretic Peptide Latest Ref Range: 0 - 100 PG/ML 966.0 (H)   Troponin-I Latest Ref Range: 0.0 - 0.05 NG/ML 0.05   ?  Completed:  -Tafamidis start 08/2019. PA approved, pt approved for [facility] patient assistance from 09/01/2019 to 03/30/2020  -Baseline KCCQ12 completed 5/18/202, Summary Score 61.46  -Baseline 6 min walk completed 08/16/2019, distance walked 950 feet, 289.75 meters, duration of test 6 minutes  -Baseline CPET completed 10/17/2019       ? S/P ablation of atrial fibrillation 05/18/2018   ? Lightheadedness 03/16/2018   ? Hospitalization within last 30 days 12/24/2017   ? Atrial fibrillation with RVR (HCC) 12/14/2017   ? Chronic kidney disease, stage 4 (severe) (HCC) 12/13/2017   ? Cardiogenic shock (HCC) 12/12/2017   ? Bradycardia 12/12/2017   ? Bilateral leg edema 12/11/2017   ? On amiodarone therapy 12/11/2017   ? Heart failure with preserved ejection fraction (  HCC) 01/01/2016   ? NSVT (nonsustained ventricular tachycardia) (HCC) 06/12/2015   ? Medication side effects 11/09/2014   ? Heart palpitations 09/19/2014   ? Longstanding persistent atrial fibrillation (HCC) 03/17/2013     03/26/2018 - ECHO:  Moderate concentric LVH seen. The EF is about 55%.  The atria are slightly dilated.  The RV function is mildly decreased.  Mild to moderate MR and TR seen.  Mild AI is present.  Based on the gradients and appearance, the AS appears to be mild. The mean gradient is 16 mm Hg with a peak velocity of 2.6 m/s.  03/26/2018 - Zio Patch:  Predominant rhythm was normal sinus.  Two runs of ventricular tachycardia occurred, they were monomorphic, the longest run lasted 3 seconds, this episode occurred on April 08, 2018 at 9:41 PM.  One episode of AIVR also appeared to be present, it lasted approximately 4.6 seconds  Brief and rare episodes of SVT that probably represent atrial fibrillation, the longest lasting 6.3 seconds.  No evidence of atrial flutter and no evidence of high degree AV block.  05/12/2018 - LAAA:  Persistent Atrial Fibrillation status post successful pulmonary vein antral isolation.  Cavotricuspid Isthmus Ablation.  Ablation of Fractionated Intracardiac Electrograms.  Ablation for an Additional Discrete Arrhythmia-LA AFL after AFIB/PVI Ablation  06/23/2018 - Cardioversion:  Successful direct current cardioversion from symptomatic atrial fibrillation to sinus rhythm.  03/16/2019 - DCCV:  Successful direct current cardioversion from atrial fibrillation to sinus rhythm.     ? Systolic murmur of aorta 02/04/2013   ? SOB (shortness of breath) 02/04/2013   ? Carotid bruit present 01/23/2012   ? Overweight (BMI 25.0-29.9) 01/30/2011   ? Essential hypertension 01/30/2009   ? Hyperlipidemia 01/30/2009     Hyperlipidemia, on statin therapy     ? Skin cancer 01/30/2009   ? Seasonal allergic reaction 01/30/2009         Review of Systems   Constitutional: Negative.   HENT: Negative.    Eyes: Negative.    Cardiovascular: Negative.    Respiratory: Positive for shortness of breath.    Endocrine: Negative.    Hematologic/Lymphatic: Negative.    Skin: Negative.    Musculoskeletal: Negative.    Gastrointestinal: Negative.    Genitourinary: Negative.    Neurological: Negative.    Psychiatric/Behavioral: Negative.    All other systems reviewed and are negative.      Physical Exam  General Appearance: no acute distress  Skin: warm & intact  HEENT: unremarkable  Neck Veins: neck veins are flat & not distended  Carotid Arteries: no bruits  Chest Inspection: chest is normal in appearance  Auscultation/Percussion: lungs clear to auscultation, no rales, rhonchi, or wheezing  Cardiac Rhythm: regular rhythm & normal rate  Cardiac Auscultation: Normal S1 & S2, no S3 or S4, no rub  Murmurs: harsh systolic murmur  Extremities: 1+ lower extremity edema  Abdominal Exam: soft, non-tender, no masses, bowel sounds normal  Liver & Spleen: no organomegaly  Neurologic Exam: oriented to time, place and person; no focal neurologic deficits  Psychiatric: Normal mood and affect.  Behavior is normal. Judgment and thought content normal.         Cardiovascular Studies  Preliminary EKG: Sinus bradycardia, heart rate 57    Cardiovascular Health Factors  Vitals BP Readings from Last 3 Encounters:   09/11/20 130/70   09/11/20 130/70   08/29/20 128/62     Wt Readings from Last 3 Encounters:   09/11/20 69.4 kg (153 lb)  09/11/20 69.4 kg (153 lb)   08/29/20 66.1 kg (145 lb 12.8 oz)     BMI Readings from Last 3 Encounters:   09/11/20 26.26 kg/m?   09/11/20 26.26 kg/m?   08/29/20 25.03 kg/m?      Smoking Social History     Tobacco Use   Smoking Status Never Smoker   Smokeless Tobacco Never Used      Lipid Profile Cholesterol   Date Value Ref Range Status   01/28/2018 148 (L) 150 - 200 Final     HDL   Date Value Ref Range Status   01/28/2018 60  Final     LDL   Date Value Ref Range Status   01/28/2018 71  Final     Triglycerides   Date Value Ref Range Status   01/28/2018 113  Final      Blood Sugar No results found for: HGBA1C  Glucose   Date Value Ref Range Status   09/10/2020 91 70 - 105 Final   08/23/2020 154 (H) 70 - 105 Final   08/14/2020 244 (H) 65 - 99 Final     Glucose, POC   Date Value Ref Range Status   08/07/2020 105 (H) 70 - 100 MG/DL Final   16/12/9602 540 (H) 70 - 100 MG/DL Final   98/01/9146 97 70 - 100 MG/DL Final   82/95/6213 086 (H) 70 - 100 MG/DL Final          Problems Addressed Today  Encounter Diagnoses   Name Primary?   ? Nonrheumatic aortic valve stenosis Yes   ? Chronic heart failure with preserved ejection fraction (HCC)    ? Longstanding persistent atrial fibrillation (HCC)    ? Hyperlipidemia, unspecified hyperlipidemia type    ? Essential hypertension    ? Wild-type transthyretin-related (ATTR) amyloidosis (HCC)        Assessment and Plan     Severe aortic stenosis  His imaging was reviewed by Ray [name] and Ray [name].  He does have evidence of severe aortic stenosis likely low-flow low gradient aortic stenosis.  They feel his anatomy is suitable for TAVR.  We will need cardiac catheterization as well as CTA's to complete his work-up.  Given his renal insufficiency we will plan to minimize contrast during the Procedure and will try to obtain CTAs the following day after IV hydration if his renal function will allow.    Mitral regurgitation  Tricuspid regurgitation  He has moderate mitral and tricuspid regurgitation which we will plan to follow closely.    Chronic diastolic heart failure  He has evidence of mild volume overload today on exam.  He describes NYHA class II heart failure symptoms.    Hypertension    Hyperlipidemia    Wild-type ATTR amyloidosis  This is managed by Ray [name].  He is currently on tafamidis.    Longstanding persistent atrial fibrillation  He is on chronic anticoagulation with Xarelto.    CKD, stage III  His most recent creatinine in our system was 2.85.  As above we will plan to minimize contrast during his cath procedure and will plan for IV hydration prior to both cath as well as CTAs.    We appreciate the opportunity to participate in his care.    Dorena Cookey, APRN  Pager [phone]       Ray [name] is an 85 year old male with a history of amyloid who is currently on tafamidis.  He has been stable with regards to progression of his infiltrative  cardiomyopathy.  He had an echocardiogram which demonstrated a nearly immobile aortic valve.  I did have a chance to personally review his echocardiogram and agree that this represents severe aortic stenosis.  His peak velocity and mean gradient slightly underestimate the severity due to a low flow state.  His aortic valve area calculates to severe and subjectively it is very restrictive.  He has underlying chronic renal insufficiency and we will plan to proceed with coronary angiography and his gated cardiac CTA so that we can size a transcatheter-based valve.  We will want to admit him prior to for hydration and be very careful with contrast utilization to preserve his renal function.  We discussed the findings on his echocardiogram as well as his symptoms and we will plan to proceed with the plan.  Thank you for allowing [place] to participate in his care and we will keep you informed as to his work-up and procedural planning.  Ray Pastor, MD      Current Medications (including today's revisions)  ? allopurinoL (ZYLOPRIM) 300 mg tablet Take 300 mg by mouth daily. Take with food.   ? amiodarone (CORDARONE) 200 mg tablet Take one tablet by mouth daily.   ? bumetanide (BUMEX) 2 mg tablet Take 2.5 tablets by mouth twice daily.   ? cholecalciferol (vitamin D3) (VITAMIN D3 PO) Take 1 tablet by mouth daily. Pt. Unsure of dosage   ? docusate (COLACE) 100 mg capsule Take 100 mg by mouth daily.   ? empagliflozin (JARDIANCE) 10 mg tablet Take one tablet by mouth daily.   ? esomeprazole DR(+) (NEXIUM) 20 mg capsule Take 20 mg by mouth daily. Take on an empty stomach at least 1 hour before or 2 hours after food.   ? ferrous sulfate (FEOSOL) 325 mg (65 mg iron) tablet Take 325 mg by mouth twice daily. Take on an empty stomach at least 1 hour before or 2 hours after food.    ? fluticasone/salmeterol (ADVAIR) 100/50 mcg inhalation disk Inhale 1 puff by mouth into the lungs daily.   ? lactobacillus rhamnosus (GG) (CULTURELLE) 10 billion cell cap Take 1 capsule by mouth daily with breakfast.   ? magnesium oxide (MAG-OX) 400 mg (241.3 mg magnesium) tablet Take one tablet by mouth daily.   ? polyethylene glycol 3350 (MIRALAX) 17 g packet Take one packet by mouth daily.   ? potassium chloride SR (KLOR-CON M20) 20 mEq tablet Take two tablets by mouth three times daily. Take an additional potassium 20 meq (1 tablet) on the days you take Metolazone (Mondays, Wednesdays, Fridays)   ? rivaroxaban (XARELTO) 15 mg tablet Take one tablet by mouth daily with breakfast. Take with food. Hold the day before and the morning of your CardioMEMS procedure.   ? rosuvastatin (CRESTOR) 5 mg tablet Take one tablet by mouth three times weekly.   ? senna/docusate (SENOKOT-S) 8.6/50 mg tablet Take one tablet by mouth as Needed.   ? sildenafiL (VIAGRA) 100 mg tablet TAKE 1 TABLET BY MOUTH ONCE DAILY AS NEEDED FOR SEXUAL ACTIVITY   ? tafamidis (VYNDAMAX) 61 mg capsule Take one capsule by mouth daily.   ? VENTOLIN HFA 90 mcg/actuation inhaler Inhale 1 puff by mouth into the lungs as Needed.

## 2020-09-11 NOTE — Progress Notes
Medicare is listed as patient's primary insurance coverage.  Pre-certification is not required for hospitalizations.

## 2020-09-12 ENCOUNTER — Encounter: Admit: 2020-09-12 | Discharge: 2020-09-12 | Payer: MEDICARE

## 2020-09-12 NOTE — Telephone Encounter
-----   Message from Geronimo Running, APRN-NP sent at 09/12/2020  9:09 AM CDT -----  Please offer him an appointment with me tomorrow at 230 at [facility].  Thank you, DM.  ----- Message -----  From: Luster Landsberg, RN  Sent: 09/11/2020  11:30 AM CDT  To: Geronimo Running, APRN-NP    Georgiana Shore, see labs for patient. Dr. Manuella Ghazi saw patient on 6/1 and these were his recommendations:  1. Recommendations: no changes  Start Jardiance 10 mg daily  Stop metoloazone    I spoke with patient after seeing K of 5.2. He states that he takes 79meQ of potassium three times daily. He confirms he no longer takes metolazone. He is taking bumex 5mg  twice daily. He is not feeling well since starting Jardiance - he has noticed swelling and weighted 145 lbs at 6/1 OV today he weighs 153 lbs on home scale.    Please let us know your recommendations and we'll reach out to patient. Thank you!

## 2020-09-13 ENCOUNTER — Ambulatory Visit: Admit: 2020-09-13 | Discharge: 2020-09-13 | Payer: MEDICARE

## 2020-09-13 ENCOUNTER — Encounter: Admit: 2020-09-13 | Discharge: 2020-09-13 | Payer: MEDICARE

## 2020-09-13 DIAGNOSIS — I358 Other nonrheumatic aortic valve disorders: Secondary | ICD-10-CM

## 2020-09-13 DIAGNOSIS — I361 Nonrheumatic tricuspid (valve) insufficiency: Secondary | ICD-10-CM

## 2020-09-13 DIAGNOSIS — J302 Other seasonal allergic rhinitis: Secondary | ICD-10-CM

## 2020-09-13 DIAGNOSIS — I350 Nonrheumatic aortic (valve) stenosis: Secondary | ICD-10-CM

## 2020-09-13 DIAGNOSIS — I340 Nonrheumatic mitral (valve) insufficiency: Secondary | ICD-10-CM

## 2020-09-13 DIAGNOSIS — C4490 Unspecified malignant neoplasm of skin, unspecified: Secondary | ICD-10-CM

## 2020-09-13 DIAGNOSIS — I10 Essential (primary) hypertension: Principal | ICD-10-CM

## 2020-09-13 DIAGNOSIS — E785 Hyperlipidemia, unspecified: Secondary | ICD-10-CM

## 2020-09-13 MED ORDER — POTASSIUM CHLORIDE 20 MEQ PO TBTQ
ORAL_TABLET | Freq: Every day | 3 refills | 30.00000 days | Status: AC
Start: 2020-09-13 — End: ?

## 2020-09-13 MED ORDER — BUMETANIDE 2 MG PO TAB
6 mg | ORAL_TABLET | Freq: Two times a day (BID) | ORAL | 3 refills | Status: AC
Start: 2020-09-13 — End: ?

## 2020-09-13 NOTE — Progress Notes
Date of Service: 09/13/2020    Ray [name] is a 85 y.o. male.       HPI      I had the pleasure of seeing Ray [name], who is accompanied by his wife Ray [name] today in [place] [facility] clinic for wild-type amyloidosis follow-up. He has implanted Cardiomems, his PAD's had been above threshold.He has medical history that is significant for wild-type amyloidosis, Cardiomems in situ, atrial fibrillation status post ablation, basal cell skin cancer and CKD stage III.    He was last seen in clinic on 6/14 by Dr. Paris Lore for evaluation for possible TAVR, prior to that he had seen Dr. [name] on 6/1 and I had last seen him on 5/10.  Today Ray [name] reports that he has had swelling in his legs and he is more short of breath with activity especially going up the stairs.  His energy level has reduced, he feels more fatigued.  Home blood pressure has been stable at 117/60.  They tell me that he is scheduled to have a cardiac cath on Monday.  He denies any chest pain or palpitations, orthopnea or PND.  He is taking his cardiovascular medications as prescribed.  He denies any fever or chills       Medical History:   Diagnosis Date   ? 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 Essential hypertension 01/30/2009   ? Hyperlipidemia 01/30/2009   ? Hypertension 01/30/2009   ? Nonrheumatic aortic valve stenosis 09/06/2020   ? Nonrheumatic mitral valve regurgitation 09/06/2020   ? Nonrheumatic tricuspid valve regurgitation 09/06/2020   ? Seasonal allergic reaction 01/30/2009   ? Skin cancer 01/30/2009     Surgical History:   Procedure Laterality Date   ? INTRACARDIAC CATHETER ABLATION WITH COMPREHENSIVE ELECTROPHYSIOLOGIC EVALUATION - ATRIAL FIBRILLATION N/A 05/12/2018    Performed by Jen Mow, MD at [facility] EP LAB   ? CATHETERIZATION RIGHT HEART WITH INSERTION PULMONARY ARTERY SENSOR N/A 12/15/2019    Performed by Harley Alto, MD at [facility] CATH LAB     Allergies   Allergen Reactions   ? Beta Blockers [Beta-Blockers (Beta-Adrenergic Blocking Agts)] SHORTNESS OF BREATH and SEE COMMENTS     Pt had beta blocker toxicity from one dose of carvedilol     Family History   Problem Relation Age of Onset   ? Stroke Father             Vitals:    09/13/20 1402   BP: 104/62   BP Source: Arm, Left Upper   Pulse: 57   Temp: 36.1 ?C (97 ?F)   SpO2: 96%   O2 Device: None (Room air)   TempSrc: Skin   PainSc: Zero   Weight: 69 kg (152 lb 3.2 oz)   Height: 162.6 cm (5' 4)     Body mass index is 26.13 kg/m?[name]     Past Medical History  Patient Active Problem List    Diagnosis Date Noted   ? 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  ?  01/26/2019 Echo EF:?60%  IVS 1.50 cm (Range: 0.6 - 1.0)         LV PW 1.40 cm (Range: 0.6 - 1.0)         ?  1. Moderate concentric left ventricular hypertrophy  2. Unable to assess diastolic function due to atrial fibrillation  3. Moderate biatrial dilatation  4. Normal right ventricular size and systolic function  5. Normal central venous pressure  6. Mild calcific aortic valve stenosis. ?Mean gradient is 16 mmHg with a peak velocity of 2.8 m/s. ?Trace aortic valve regurgitation is seen  7. Mitral valve is structurally okay. ?There is moderate central regurgitation. ?No stenosis  8. Moderate tricuspid valve regurgitation  9. Pulmonary artery static pressure measures at 33 mmHg   06/28/2019 TC PYP Multiview planar views and SPECT imaging confirms the presence of severe marked diffuse global left ventricular and right ventricular myocardial uptake of tracer. ?There is normal rib and bone uptake.  ?  Two hour heart/contralateral lung (H/CL) ratio: ?1.977?this metric is abnormal and highly suggestive of ATTR cardiac amyloidosis.?  ?  Visual Semi-Quantitative Grading Scale Analysis:?  The study is grade 3 indicating myocardial uptake greater than rib and bone uptake. ?The pattern is highly and strongly suggestive of ATTR cardiac amyloidosis.  ?  SUMMARY/OPINION:   This study is abnormal and strongly suggestive of ATTR cardiac amyloidosis. ?Left ventricular myocardial uptake is diffuse, intense, and global. ?There is also prominent right ventricular free wall uptake of tracer. ?The pattern is typical for ATTR cardiac amyloidosis   ? Cardiac MRI ?   07/25/2019 Kappa/Lambda FLC ?  ? Ref. Range 07/25/2019 15:48   Kappa, FLC Latest Ref Range: 0.33 - 1.94 MG/DL 1.61 (H)   Lambda, FLC Latest Ref Range: 0.57 - 2.63 MG/DL 0.96 (H)   Kappa/Lambda FLC Latest Ref Range: 0.26 - 1.65  1.83 (H)      07/25/2019 Serum Immunofixation ?NO PARAPROTEIN SEEN   cancelled Urine Immunofixation ?Patient unable to provide urine sample 4/26    07/25/2019 Genetic Testing ?Negative   ? Biopsy (Fat Pad, Cardiac, Bone Marrow) ?   ? GI Symptoms ?   ?+? 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   ?  ? Ref. Range 07/25/2019 15:48   B Type Natriuretic Peptide Latest Ref Range: 0 - 100 PG/ML 966.0 (H)   Troponin-I Latest Ref Range: 0.0 - 0.05 NG/ML 0.05   ?  Completed:  -Tafamidis start 08/2019. PA approved, pt approved for [employer] patient assistance from 09/01/2019 to 03/30/2020  -Baseline KCCQ12 completed 5/18/202, Summary Score 61.46  -Baseline 6 min walk completed 08/16/2019, distance walked 950 feet, 289.75 meters, duration of test 6 minutes  -Baseline CPET completed 10/17/2019       ? Nonrheumatic mitral valve regurgitation 09/06/2020   ? Nonrheumatic tricuspid valve regurgitation 09/06/2020   ? Nonrheumatic aortic valve stenosis 09/06/2020   ? Asymptomatic microscopic hematuria 12/25/2019   ? S/P left pulmonary artery pressure sensor implant placement 12/15/2019     *Patient has CardioMEMS (Pulmonary Artery Pressure Sensor)* Please call [name], CardioMEMs Program Coordinator [phone] or page Heart Failure Rounding Team if patient is admitted or presents to ED*          ? Chronic heart failure with preserved ejection fraction (HCC) 12/15/2019   ? Acute on chronic heart failure with preserved ejection fraction (HFpEF) (HCC) 11/14/2019   ? S/P ablation of atrial fibrillation 05/18/2018   ? Lightheadedness 03/16/2018   ? Hospitalization within last 30 days 12/24/2017   ? Atrial fibrillation with RVR (HCC) 12/14/2017   ? Chronic kidney disease, stage 4 (severe) (HCC) 12/13/2017   ? Cardiogenic shock (HCC) 12/12/2017   ? Bradycardia 12/12/2017   ? Bilateral leg edema  12/11/2017   ? On amiodarone therapy 12/11/2017   ? Heart failure with preserved ejection fraction (HCC) 01/01/2016   ? NSVT (nonsustained ventricular tachycardia) (HCC) 06/12/2015   ? Medication side effects 11/09/2014   ? Heart palpitations 09/19/2014   ? Longstanding persistent atrial fibrillation (HCC) 03/17/2013     03/26/2018 - ECHO:  Moderate concentric LVH seen. The EF is about 55%.  The atria are slightly dilated.  The RV function is mildly decreased.  Mild to moderate MR and TR seen.  Mild AI is present.  Based on the gradients and appearance, the AS appears to be mild. The mean gradient is 16 mm Hg with a peak velocity of 2.6 m/s.  03/26/2018 - Zio Patch:  Predominant rhythm was normal sinus.  Two runs of ventricular tachycardia occurred, they were monomorphic, the longest run lasted 3 seconds, this episode occurred on April 08, 2018 at 9:41 PM.  One episode of AIVR also appeared to be present, it lasted approximately 4.6 seconds  Brief and rare episodes of SVT that probably represent atrial fibrillation, the longest lasting 6.3 seconds.  No evidence of atrial flutter and no evidence of high degree AV block.  05/12/2018 - LAAA:  Persistent Atrial Fibrillation status post successful pulmonary vein antral isolation.  Cavotricuspid Isthmus Ablation.  Ablation of Fractionated Intracardiac Electrograms.  Ablation for an Additional Discrete Arrhythmia-LA AFL after AFIB/PVI Ablation  06/23/2018 - Cardioversion:  Successful direct current cardioversion from symptomatic atrial fibrillation to sinus rhythm.  03/16/2019 - DCCV:  Successful direct current cardioversion from atrial fibrillation to sinus rhythm.     ? Systolic murmur of aorta 02/04/2013   ? SOB (shortness of breath) 02/04/2013   ? Carotid bruit present 01/23/2012   ? Overweight (BMI 25.0-29.9) 01/30/2011   ? Essential hypertension 01/30/2009   ? Hyperlipidemia 01/30/2009     Hyperlipidemia, on statin therapy     ? Skin cancer 01/30/2009   ? Seasonal allergic reaction 01/30/2009         Review of Systems   Constitutional: Negative.   HENT: Negative.    Eyes: Negative.    Cardiovascular: Negative.    Respiratory: Negative.    Endocrine: Negative.    Hematologic/Lymphatic: Negative.    Skin: Negative.    Musculoskeletal: Negative.    Gastrointestinal: Positive for constipation.   Genitourinary: Negative.    Neurological: Negative.    Psychiatric/Behavioral: Negative.    Allergic/Immunologic: Negative.        Physical Exam  General Appearance: In NAD.   Neck Veins: 7 cm JVP, neck veins are distended, + HJR   Chest Inspection: chest is normal in appearance   Respiratory Effort: breathing comfortably, no respiratory distress   Auscultation/Percussion: lungs clear to auscultation, no rales, rhonchi or wheezing   Cardiac Rhythm: irregularly irregular rhythm and normal rate   Cardiac Auscultation: S1, S2 normal, no rub, no definite S3  or S4   Murmurs: grade iii/vi systolic ejection murmur   Peripheral Circulation: normal peripheral circulation   Pedal Pulses: normal symmetric pedal pulses   Lower Extremity Edema: +1 mm bilateral lower extremity edema to the mid shins legs have venous discoloration.  The left leg is wrapped from the knee distal to the ankle.  Abdominal Exam: Slightly protuberant, non-tender, no obvious masses, bowel sounds normal   Gait & Station: walks without assistance Orientation: oriented to person, place and time   Affect & Mood: appropriate and sustained affect   Language and Memory: patient responsive and seems to comprehend  information   Neurologic Exam: neurological assessment grossly intact   Vital signs were reviewed  Lower back incision dressing removed and replaced, moh's procedure tunneled    ? Cardiovascular Studies: echo 08/07/2020    1. Normal LV size and systolic function. LVEF 55-65%. Severe LVH.   2. Mildly dilated RV with reduced RV systolic function.   3. Mild to moderate bi-atrial dilatation.   4. Moderate to severe mitral and tricuspid regurgitation.   5. Aortic valve is calcified. Aortic valve leaflets appear to be severely restricted. MG 26 mmHG, PG 42 mmHg, AVA 0.6cm2, Vmax 3.3 m/sec. This may represent severe low flow, low gradient aortic valve stenosis.    Estimated peak systolic PA pressure of 46 mmHg?  Compared to echo from last year, gradients across the aortic valve are higher. PA pressure is higher.    ?    6 Minute Walk Last Results 06/26/2020 08/16/2019   SpO2:  Pre-Activity 99 99   Pulse:  POST-Activity 94 122   BP:  POST-Activity 126/76 151/76   SpO2:  POST-Activity 99 96   Distance Walked in Meters 152.5 289.75       Problems Addressed Today  Encounter Diagnoses   Name Primary?   ? Aortic stenosis, mild Yes       Assessment and Plan   Wild-type amyloidosis:   -He has had a previous technetium pyrophosphate scan on 06/28/2019 that showed a 2-hour heart/contralateral lung ratio of 1.97 with visual semiquantitative grading grade 3, abnormal and highly suggestive of ATTR cardiac amyloidosis.   - He has had kappa 7.45, lambda 4.07 and free light chain ratio 1.83 on 07/25/2019  -He had serum electrophoresis that showed no paraprotein.   -See 6-minute walk noted above  -He has completed a KCC Q score that was 72.92 indicative of fair to good quality of life.    -He is on tafamidis 61 mg daily.    -He had a baseline CPET on 7/19 2021, will repeat this on 10/09/2020  -He had repeat TC PYP on 06/11/2020, it showed 2-hour heart/contralateral lung ratio reduced to 1.7, visual semiquantitative grading scale remains at 3.  -Follow-up appointment with Dr. [name] July 14.[name]       Heart failure with preserved ejection fraction:  - He appears hypervolemic on physical exam on  Bumex 5 mg p.o. twice daily.  His Cardiomems reading has been escalating since his metolazone was discontinued, PA diastolic today is 27, it was 31 on 6/15.  He is very adherent to a low-sodium diet.  We will plan to  -Increase the Bumex to 6 mg twice daily.    CardioMEMS in situ: -12/15/19 IMPLANT RHC:?  BP:?126/76 (99)  RA:?27  RV:?60/32  PA:?57/31 (42)  PCWP:?30  TPG:?12  PVR:?3.8  SVR:?1840.  CO (Fick):?3.1  CI (Fick):?1.7?  AVO2 Diff 7.18?  Daily CardioMEMS Readings  ?Goal PA Diastolic:  22  PA Diastolic Thresholds:  20-24    Hyperkalemia: he is currently taking kdur 40 M EQ p.o. 3 times daily, will reduce the potassium to 40 M EQ twice daily and 20 M EQ once daily, repeat chemistry when he comes in for his cardiac cath next week.  -Repeat chemistry in 1 week.  Severe aortic stenosis: The echo today shows worsening aortic stenosis, likely representing low flow low-gradient aortic stenosis.  -He has been evaluated by Dr. Paris Lore on 6/14, it sounds like they are planning on coronary angiography and gated cardiac CTA to size a transcatheter-based valve.  I will  send my note to Dr. Paris Lore, Helen Hashimoto and Dr. [name] to update them on his hypervolemia.    CKD stage III: Last creatinine on 6/13 was 2.30   Atrial fibrillation: He continues to take amiodarone 200 mg p.o.daily, he is on oral anticoagulation with Xarelto 15 mg daily, he denies any upper or lower GI bleeding symptoms.  He has been in atrial fibrillation since at least 2020, has had previous ablations.        have personally documented the HPI, exam and medical decision making.  Patient education: I reviewed recent lab results and current medications, medication instructions, discussed heart failure signs & symptoms,  low  sodium diet, fluid restriction and daily weights.I have instructed the patient on the plan of care and they verbalize understanding of the plan. Please see AVS for full patient teaching. Patient advised to call our office if s/he has any problems, questions, worsening symptoms, or concerns prior to the next appointment.   Thanks for allowing me to see this nice patient. If I can be of additional assistance, please don't hesitate to contact me.     [name] AGPCNP-BC, [facility]  Pager [phone]  Collaborating physician: Dr. Vanetta Shawl             Encounter Diagnoses   Name Primary?   ? Nonrheumatic aortic valve stenosis Yes                       Current Medications (including today's revisions)  ? allopurinoL (ZYLOPRIM) 300 mg tablet Take 300 mg by mouth daily. Take with food.   ? amiodarone (CORDARONE) 200 mg tablet Take one tablet by mouth daily.   ? bumetanide (BUMEX) 2 mg tablet Take three tablets by mouth twice daily.   ? cholecalciferol (vitamin D3) (VITAMIN D3 PO) Take 1 tablet by mouth daily. Pt. Unsure of dosage   ? docusate (COLACE) 100 mg capsule Take 100 mg by mouth daily.   ? empagliflozin (JARDIANCE) 10 mg tablet Take one tablet by mouth daily.   ? esomeprazole DR(+) (NEXIUM) 20 mg capsule Take 20 mg by mouth daily. Take on an empty stomach at least 1 hour before or 2 hours after food.   ? ferrous sulfate (FEOSOL) 325 mg (65 mg iron) tablet Take 325 mg by mouth twice daily. Take on an empty stomach at least 1 hour before or 2 hours after food.    ? fluticasone/salmeterol (ADVAIR) 100/50 mcg inhalation disk Inhale 1 puff by mouth into the lungs daily.   ? lactobacillus rhamnosus (GG) (CULTURELLE) 10 billion cell cap Take 1 capsule by mouth daily with breakfast.   ? magnesium oxide (MAG-OX) 400 mg (241.3 mg magnesium) tablet Take one tablet by mouth daily.   ? polyethylene glycol 3350 (MIRALAX) 17 g packet Take one packet by mouth daily.   ? potassium chloride SR (KLOR-CON M20) 20 mEq tablet Take 40 meq twice daily and 20 meq once daily.   ? rivaroxaban (XARELTO) 15 mg tablet Take one tablet by mouth daily with breakfast. Take with food. Hold the day before and the morning of your CardioMEMS procedure.   ? rosuvastatin (CRESTOR) 5 mg tablet Take one tablet by mouth three times weekly.   ? senna/docusate (SENOKOT-S) 8.6/50 mg tablet Take one tablet by mouth as Needed.   ? sildenafiL (VIAGRA) 100 mg tablet TAKE 1 TABLET BY MOUTH ONCE DAILY AS NEEDED FOR SEXUAL ACTIVITY   ? tafamidis (VYNDAMAX) 61 mg capsule Take one capsule by  mouth daily.   ? VENTOLIN HFA 90 mcg/actuation inhaler Inhale 1 puff by mouth into the lungs as Needed.

## 2020-09-13 NOTE — Progress Notes
Daily CardioMEMS Readings    Goal PA Diastolic: 22 mmHg   PA Diastolic Thresholds: 123456 mmHg

## 2020-09-17 ENCOUNTER — Encounter: Admit: 2020-09-17 | Discharge: 2020-09-17 | Payer: MEDICARE

## 2020-09-17 DIAGNOSIS — I350 Nonrheumatic aortic (valve) stenosis: Secondary | ICD-10-CM

## 2020-09-17 DIAGNOSIS — I10 Essential (primary) hypertension: Secondary | ICD-10-CM

## 2020-09-17 DIAGNOSIS — C4490 Unspecified malignant neoplasm of skin, unspecified: Secondary | ICD-10-CM

## 2020-09-17 DIAGNOSIS — E785 Hyperlipidemia, unspecified: Secondary | ICD-10-CM

## 2020-09-17 DIAGNOSIS — J302 Other seasonal allergic rhinitis: Secondary | ICD-10-CM

## 2020-09-17 DIAGNOSIS — I358 Other nonrheumatic aortic valve disorders: Secondary | ICD-10-CM

## 2020-09-17 DIAGNOSIS — I340 Nonrheumatic mitral (valve) insufficiency: Secondary | ICD-10-CM

## 2020-09-17 DIAGNOSIS — I361 Nonrheumatic tricuspid (valve) insufficiency: Secondary | ICD-10-CM

## 2020-09-20 ENCOUNTER — Encounter: Admit: 2020-09-20 | Discharge: 2020-09-20 | Payer: MEDICARE

## 2020-09-20 DIAGNOSIS — I5030 Unspecified diastolic (congestive) heart failure: Secondary | ICD-10-CM

## 2020-09-20 DIAGNOSIS — I350 Nonrheumatic aortic (valve) stenosis: Secondary | ICD-10-CM

## 2020-09-20 DIAGNOSIS — E7800 Pure hypercholesterolemia, unspecified: Secondary | ICD-10-CM

## 2020-09-20 DIAGNOSIS — I10 Essential (primary) hypertension: Secondary | ICD-10-CM

## 2020-09-20 LAB — BASIC METABOLIC PANEL
BLD UREA NITROGEN: 68 — ABNORMAL HIGH (ref 8.4–25.7)
CALCIUM: 9.2
CHLORIDE: 96 — ABNORMAL LOW (ref 98–107)
CO2: 24
CREATININE: 2.7 — ABNORMAL HIGH (ref 0.72–1.25)
GLUCOSE,PANEL: 124 — ABNORMAL HIGH (ref 70–105)
POTASSIUM: 3.4 — ABNORMAL LOW (ref 3.5–5.1)
SODIUM: 137

## 2020-09-20 MED ORDER — POTASSIUM CHLORIDE 20 MEQ PO TBTQ
40 meq | ORAL_TABLET | Freq: Three times a day (TID) | ORAL | 3 refills | 30.00000 days | Status: AC
Start: 2020-09-20 — End: ?

## 2020-09-20 MED ORDER — POTASSIUM CHLORIDE 20 MEQ PO TBTQ
ORAL_TABLET | Freq: Every day | 3 refills
Start: 2020-09-20 — End: ?

## 2020-09-20 NOTE — Telephone Encounter
Called and spoke with pt. Instructed him to increase potassium to 2 tabs or 40 meq TID and to recheck BMP in 1 week. Pt is agreeable to this

## 2020-09-20 NOTE — Telephone Encounter
-----   Message from Geronimo Running, APRN-NP sent at 09/20/2020  4:58 PM CDT -----  His potassium has dropped again, please have him increase the potassium to 40 M EQ p.o. 3 times daily and get a repeat chemistry in 1 week.  Thank you, DM.

## 2020-09-21 ENCOUNTER — Encounter: Admit: 2020-09-21 | Discharge: 2020-09-21 | Payer: MEDICARE

## 2020-09-21 DIAGNOSIS — I350 Nonrheumatic aortic (valve) stenosis: Secondary | ICD-10-CM

## 2020-09-24 ENCOUNTER — Encounter: Admit: 2020-09-24 | Discharge: 2020-09-24 | Payer: MEDICARE

## 2020-09-24 DIAGNOSIS — I5032 Chronic diastolic (congestive) heart failure: Secondary | ICD-10-CM

## 2020-09-24 NOTE — Progress Notes
Spoke to patient's wife who stated dental work was completed. [place] at [phone], spoke to [place] and asked for letter of dental clearance. Tye [place] stated she would ask the doctor for this. Valve clinic nurses' direct phone number [phone]) & fax number [phone]) provided.

## 2020-09-26 ENCOUNTER — Encounter: Admit: 2020-09-26 | Discharge: 2020-09-26 | Payer: MEDICARE

## 2020-09-26 NOTE — Telephone Encounter
Called patient to clarify his Potassium dose and frequency. Helene Kelp, wife states "I think he is taking 40 meq three times a day" and that patient is unavailable at this time. Jonelle Sidle, RN reviewed labs with reccs from [place]. Wife asking what kidney function lab results due to upcoming CT for valve repair. Advised wife will call patient tomorrow to review all of above. Helene Kelp verbalizes understanding and denies any further questions, concerns or needs at this time.

## 2020-09-26 NOTE — Progress Notes
Please fax lab results drawn 09/24/20 to [phone] ATTN: [name], APRN or CardioMEMS Nurse. Thank you!

## 2020-09-26 NOTE — Telephone Encounter
-----   Message from Geronimo Running, APRN-NP sent at 09/26/2020  4:43 PM CDT -----  His repeat labs on 6/27 showed K 3.2, and creat 2.76, His current dose of kdur is 40 meq TID. Please have him take extra 20 meq potassium daily, chem next week.  His Cardiomems shows PASP 69/28.

## 2020-09-27 ENCOUNTER — Encounter: Admit: 2020-09-27 | Discharge: 2020-09-27 | Payer: MEDICARE

## 2020-09-27 DIAGNOSIS — I5030 Unspecified diastolic (congestive) heart failure: Secondary | ICD-10-CM

## 2020-09-27 MED ORDER — POTASSIUM CHLORIDE 20 MEQ PO TBTQ
40 meq | ORAL_TABLET | Freq: Three times a day (TID) | ORAL | 3 refills | 30.00000 days | Status: AC
Start: 2020-09-27 — End: ?

## 2020-09-27 NOTE — Telephone Encounter
PC to pt per PHI left detailed msg on his preferred phone # as well as pt's wife phone # listed in chart. Sent pt Mychart message with provider instructions.Lab order faxed to [phone].  Asked pt or spouse to call back or reply to mychart msg to confirm they received providers instructions.

## 2020-09-28 ENCOUNTER — Encounter: Admit: 2020-09-28 | Discharge: 2020-09-28 | Payer: MEDICARE

## 2020-09-28 ENCOUNTER — Observation Stay: Admit: 2020-09-28 | Discharge: 2020-09-28 | Payer: MEDICARE

## 2020-09-28 DIAGNOSIS — I350 Nonrheumatic aortic (valve) stenosis: Secondary | ICD-10-CM

## 2020-09-28 MED ORDER — IOHEXOL 350 MG IODINE/ML IV SOLN
100 mL | Freq: Once | INTRAVENOUS | 0 refills | Status: CP
Start: 2020-09-28 — End: ?
  Administered 2020-09-28: 16:00:00 100 mL via INTRAVENOUS

## 2020-09-28 MED ORDER — SODIUM CHLORIDE 0.9 % IJ SOLN
50 mL | Freq: Once | INTRAVENOUS | 0 refills | Status: CP
Start: 2020-09-28 — End: ?
  Administered 2020-09-28: 16:00:00 50 mL via INTRAVENOUS

## 2020-10-03 ENCOUNTER — Encounter: Admit: 2020-10-03 | Discharge: 2020-10-03 | Payer: MEDICARE

## 2020-10-03 DIAGNOSIS — I5030 Unspecified diastolic (congestive) heart failure: Secondary | ICD-10-CM

## 2020-10-03 NOTE — Telephone Encounter
Per Dr. Manuella Ghazi, Pt to see [name], APRN tomorrow morning @ 9 AM at the [facility] office. Pt is to have STAT labs on arrival. If his lab values are abnormal he will be directly admitted to [facility]. If patient feeling poorly prior to OV tomorrow AM he should proceed to [facility] ER.

## 2020-10-03 NOTE — Telephone Encounter
PC to pt at preferred phone # listed as well as wife's cell #. No answer. Per PHI left detailed messages on both phone lines with Provider recommendations below. Left direct phone # for this writer to please call back to confirm.

## 2020-10-03 NOTE — Telephone Encounter
PC to pt and spouse. Spoke to both on speaker phone. The patient reports he felt "very rough"  7/2-7/5 ( unable to urinate much, feeling full/early satiety, pedal edema and SOA. He states he is now urinating and his SOA has improved. Pt does not sound audibly SOA while speaking on phone. He verbalizes understanding regarding tomorrow's OV with [name], APRN and possible hospital stay and is agreeable to plan.

## 2020-10-03 NOTE — Telephone Encounter
PC to [name], APRN to discuss large Cr bump to 4.02 from 2.76, nine days ago. Pt's PADs on daily CardioMEMS readings remain elevated as well(see below). Pt had CTA Chest, Abd/Pelvis on 09/28/20 and Cardiac Cath 09/17/20 for TAVR workup. PAD's above thresholds. Per provider, got a hold of Dr. Manuella Ghazi via his nurse in clinic. [name], APRN very concerned about patient.  Message relayed to Dr. Aida Puffer via Office RN. Result note sent to MD as well. Will await recommendations and call patient with MD instructions asap.    Goal PAD: 22 mmHg: PAD Thresholds: 20-24 mmHg  7/6 PAD 25 mmHg  7/5 PAD 26  7/4 PAD 26  7/3 PAD 26  7/2 PAD 26    Copy of last med changes:  Recs from 6/27:  His repeat labs on 6/27 showed K 3.2, and creat 2.76, His current dose of kdur is 40 meq TID. Please have him take extra 20 meq potassium daily, chem next week.  His Cardiomems shows PAS/PAD= 69/28.

## 2020-10-04 ENCOUNTER — Encounter: Admit: 2020-10-04 | Discharge: 2020-10-04 | Payer: MEDICARE

## 2020-10-04 ENCOUNTER — Ambulatory Visit: Admit: 2020-10-04 | Discharge: 2020-10-04 | Payer: MEDICARE

## 2020-10-04 DIAGNOSIS — I350 Nonrheumatic aortic (valve) stenosis: Secondary | ICD-10-CM

## 2020-10-04 DIAGNOSIS — I5030 Unspecified diastolic (congestive) heart failure: Secondary | ICD-10-CM

## 2020-10-04 DIAGNOSIS — I340 Nonrheumatic mitral (valve) insufficiency: Secondary | ICD-10-CM

## 2020-10-04 DIAGNOSIS — C4490 Unspecified malignant neoplasm of skin, unspecified: Secondary | ICD-10-CM

## 2020-10-04 DIAGNOSIS — I358 Other nonrheumatic aortic valve disorders: Secondary | ICD-10-CM

## 2020-10-04 DIAGNOSIS — I361 Nonrheumatic tricuspid (valve) insufficiency: Secondary | ICD-10-CM

## 2020-10-04 DIAGNOSIS — J302 Other seasonal allergic rhinitis: Secondary | ICD-10-CM

## 2020-10-04 DIAGNOSIS — I10 Essential (primary) hypertension: Principal | ICD-10-CM

## 2020-10-04 DIAGNOSIS — E785 Hyperlipidemia, unspecified: Secondary | ICD-10-CM

## 2020-10-04 LAB — POC BASIC METABOLIC PANEL (BMP)
ANION GAP, POC: 14 — ABNORMAL HIGH (ref 3–12)
CHLORIDE, POC: 92 MMOL/L — ABNORMAL LOW (ref 98–110)
POTASSIUM, POC: 3.7 MMOL/L (ref 3.5–5.1)
SODIUM, POC: 133 MMOL/L — ABNORMAL LOW (ref 137–147)

## 2020-10-04 MED ORDER — POTASSIUM CHLORIDE 20 MEQ PO TBTQ
20 meq | ORAL_TABLET | Freq: Every day | ORAL | 3 refills | 30.00000 days | Status: AC
Start: 2020-10-04 — End: ?

## 2020-10-04 NOTE — Progress Notes
ThanDate of Service: 10/04/2020    Ray [name] is a 85 y.o. male.       HPI      I had the pleasure of seeing Ray [name], who is accompanied by his wife Ray [name] today in [place] [facility] clinic for wild-type amyloidosis follow-up. He has implanted Cardiomems, his PAD's had been above threshold.He has medical history that is significant for wild-type amyloidosis, Cardiomems in situ, atrial fibrillation status post ablation, basal cell skin cancer and CKD stage III.    He was last seen in clinic on 6/14 by Dr. Paris Lore for evaluation for possible TAVR, prior to that he had seen Dr. [name] on 6/1 and I had last seen him on 6/16, at that office visit I had increased his Bumex to 6 mg twice daily and had adjusted his potassium.  Of note he had labs drawn on 7/5 that showed an increase in creatinine from 2.76-4.02.  Of note he had CT chest with and without contrast and abdomen on 7/1.  Our Cardiomems coordinator Darl Pikes let me know his chemistry results, in addition he had not been urinating very much between July 2 through 5 and was feeling full early, had early satiety, peripheral edema and shortness of breath.  I had asked them to let Dr. [name] know, he had advised patient come in to clinic today for a stat BMP, if his creatinine was still rising would plan to admit him.  His last PA diastolic was on 7/6 was 25, thresholds are 20-24.      Today [name] reports that he accidentally took a metolazone this morning he denies dizziness, chest pain or palpitations.  He reports that he felt pretty bad on Sunday, he had not urinated very much however that has started to increase.  He still is very active gardening, he put up some electric fence yesterday.       Medical History:   Diagnosis Date   ? 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 Essential hypertension 01/30/2009   ? Hyperlipidemia 01/30/2009   ? Hypertension 01/30/2009   ? Nonrheumatic aortic valve stenosis 09/06/2020   ? Nonrheumatic mitral valve regurgitation 09/06/2020   ? Nonrheumatic tricuspid valve regurgitation 09/06/2020   ? Seasonal allergic reaction 01/30/2009   ? Skin cancer 01/30/2009     Surgical History:   Procedure Laterality Date   ? INTRACARDIAC CATHETER ABLATION WITH COMPREHENSIVE ELECTROPHYSIOLOGIC EVALUATION - ATRIAL FIBRILLATION N/A 05/12/2018    Performed by Jen Mow, MD at [facility] EP LAB   ? CATHETERIZATION RIGHT HEART WITH INSERTION PULMONARY ARTERY SENSOR N/A 12/15/2019    Performed by Harley Alto, MD at [facility] CATH LAB   ? ANGIOGRAPHY CORONARY ARTERY WITH LEFT HEART CATHETERIZATION N/A 09/17/2020    Performed by Harley Alto, MD at [facility] CATH LAB   ? POSSIBLE PERCUTANEOUS CORONARY STENT PLACEMENT WITH ANGIOPLASTY N/A 09/17/2020    Performed by Harley Alto, MD at [facility] CATH LAB     Allergies   Allergen Reactions   ? Beta Blockers [Beta-Blockers (Beta-Adrenergic Blocking Agts)] SHORTNESS OF BREATH and SEE COMMENTS     Pt had beta blocker toxicity from one dose of carvedilol     Family History   Problem Relation Age of Onset   ? Stroke Father             Vitals:    10/04/20 0857   BP: 128/60   BP Source: Arm, Left Upper   Pulse: 92   SpO2: 99%   O2  Device: None (Room air)   Weight: 67.8 kg (149 lb 6.4 oz)   Height: 165.1 cm (5' 5)     Body mass index is 24.86 kg/m?[name]     Past Medical History  Patient Active Problem List    Diagnosis Date Noted   ? 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  ?  01/26/2019 Echo EF:?60%  IVS 1.50 cm (Range: 0.6 - 1.0)         LV PW 1.40 cm (Range: 0.6 - 1.0)         ?  1. Moderate concentric left ventricular hypertrophy  2. Unable to assess diastolic function due to atrial fibrillation  3. Moderate biatrial dilatation  4. Normal right ventricular size and systolic function  5. Normal central venous pressure  6. Mild calcific aortic valve stenosis. ?Mean gradient is 16 mmHg with a peak velocity of 2.8 m/s. ?Trace aortic valve regurgitation is seen  7. Mitral valve is structurally okay. ?There is moderate central regurgitation. ?No stenosis  8. Moderate tricuspid valve regurgitation  9. Pulmonary artery static pressure measures at 33 mmHg   06/28/2019 TC PYP Multiview planar views and SPECT imaging confirms the presence of severe marked diffuse global left ventricular and right ventricular myocardial uptake of tracer. ?There is normal rib and bone uptake.  ?  Two hour heart/contralateral lung (H/CL) ratio: ?1.977?this metric is abnormal and highly suggestive of ATTR cardiac amyloidosis.?  ?  Visual Semi-Quantitative Grading Scale Analysis:?  The study is grade 3 indicating myocardial uptake greater than rib and bone uptake. ?The pattern is highly and strongly suggestive of ATTR cardiac amyloidosis.  ?  SUMMARY/OPINION:   This study is abnormal and strongly suggestive of ATTR cardiac amyloidosis. ?Left ventricular myocardial uptake is diffuse, intense, and global. ?There is also prominent right ventricular free wall uptake of tracer. ?The pattern is typical for ATTR cardiac amyloidosis   ? Cardiac MRI ?   07/25/2019 Kappa/Lambda FLC ?  ? Ref. Range 07/25/2019 15:48   Kappa, FLC Latest Ref Range: 0.33 - 1.94 MG/DL 1.61 (H)   Lambda, FLC Latest Ref Range: 0.57 - 2.63 MG/DL 0.96 (H)   Kappa/Lambda FLC Latest Ref Range: 0.26 - 1.65  1.83 (H)      07/25/2019 Serum Immunofixation ?NO PARAPROTEIN SEEN   cancelled Urine Immunofixation ?Patient unable to provide urine sample 4/26    07/25/2019 Genetic Testing ?Negative   ? Biopsy (Fat Pad, Cardiac, Bone Marrow) ?   ? GI Symptoms ?   ?+? 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   ?  ? Ref. Range 07/25/2019 15:48   B Type Natriuretic Peptide Latest Ref Range: 0 - 100 PG/ML 966.0 (H)   Troponin-I Latest Ref Range: 0.0 - 0.05 NG/ML 0.05   ?  Completed:  -Tafamidis start 08/2019. PA approved, pt approved for [employer] patient assistance from 09/01/2019 to 03/30/2020  -Baseline KCCQ12 completed 5/18/202, Summary Score 61.46  -Baseline 6 min walk completed 08/16/2019, distance walked 950 feet, 289.75 meters, duration of test 6 minutes  -Baseline CPET completed 10/17/2019       ? Nonrheumatic mitral valve regurgitation 09/06/2020   ? Nonrheumatic tricuspid valve regurgitation 09/06/2020   ? Nonrheumatic aortic valve stenosis 09/06/2020   ? Asymptomatic microscopic  hematuria 12/25/2019   ? S/P left pulmonary artery pressure sensor implant placement 12/15/2019     *Patient has CardioMEMS (Pulmonary Artery Pressure Sensor)* Please call [name], CardioMEMs Program Coordinator [phone] or page Heart Failure Rounding Team if patient is admitted or presents to ED*          ? Chronic heart failure with preserved ejection fraction (HCC) 12/15/2019   ? Acute on chronic heart failure with preserved ejection fraction (HFpEF) (HCC) 11/14/2019   ? S/P ablation of atrial fibrillation 05/18/2018   ? Lightheadedness 03/16/2018   ? Hospitalization within last 30 days 12/24/2017   ? Atrial fibrillation with RVR (HCC) 12/14/2017   ? Chronic kidney disease, stage 4 (severe) (HCC) 12/13/2017   ? Cardiogenic shock (HCC) 12/12/2017   ? Bradycardia 12/12/2017   ? Bilateral leg edema 12/11/2017   ? On amiodarone therapy 12/11/2017   ? Heart failure with preserved ejection fraction (HCC) 01/01/2016   ? NSVT (nonsustained ventricular tachycardia) (HCC) 06/12/2015   ? Medication side effects 11/09/2014   ? Heart palpitations 09/19/2014   ? Longstanding persistent atrial fibrillation (HCC) 03/17/2013     03/26/2018 - ECHO:  Moderate concentric LVH seen. The EF is about 55%.  The atria are slightly dilated.  The RV function is mildly decreased.  Mild to moderate MR and TR seen.  Mild AI is present.  Based on the gradients and appearance, the AS appears to be mild. The mean gradient is 16 mm Hg with a peak velocity of 2.6 m/s.  03/26/2018 - Zio Patch:  Predominant rhythm was normal sinus.  Two runs of ventricular tachycardia occurred, they were monomorphic, the longest run lasted 3 seconds, this episode occurred on April 08, 2018 at 9:41 PM.  One episode of AIVR also appeared to be present, it lasted approximately 4.6 seconds  Brief and rare episodes of SVT that probably represent atrial fibrillation, the longest lasting 6.3 seconds.  No evidence of atrial flutter and no evidence of high degree AV block.  05/12/2018 - LAAA:  Persistent Atrial Fibrillation status post successful pulmonary vein antral isolation.  Cavotricuspid Isthmus Ablation.  Ablation of Fractionated Intracardiac Electrograms.  Ablation for an Additional Discrete Arrhythmia-LA AFL after AFIB/PVI Ablation  06/23/2018 - Cardioversion:  Successful direct current cardioversion from symptomatic atrial fibrillation to sinus rhythm.  03/16/2019 - DCCV:  Successful direct current cardioversion from atrial fibrillation to sinus rhythm.     ? Systolic murmur of aorta 02/04/2013   ? SOB (shortness of breath) 02/04/2013   ? Carotid bruit present 01/23/2012   ? Overweight (BMI 25.0-29.9) 01/30/2011   ? Essential hypertension 01/30/2009   ? Hyperlipidemia 01/30/2009     Hyperlipidemia, on statin therapy     ? Skin cancer 01/30/2009   ? Seasonal allergic reaction 01/30/2009         Review of Systems   Constitutional: Negative.   HENT: Negative.    Eyes: Negative.    Cardiovascular: Negative.    Respiratory: Negative.    Endocrine: Negative.    Hematologic/Lymphatic: Negative.    Skin: Negative.    Musculoskeletal: Negative.    Gastrointestinal: Positive for constipation.   Genitourinary: Negative.    Neurological: Negative.    Psychiatric/Behavioral: Negative.    Allergic/Immunologic: Negative.        Physical Exam  General Appearance: In NAD.   Neck Veins: 7 cm JVP, neck veins are distended, + HJR Chest Inspection: chest is normal in appearance   Respiratory Effort: breathing comfortably, no respiratory  distress   Auscultation/Percussion: lungs clear to auscultation, no rales, rhonchi or wheezing   Cardiac Rhythm: irregularly irregular rhythm and normal rate   Cardiac Auscultation: S1, S2 normal, no rub, no definite S3  or S4   Murmurs: grade iii/vi systolic ejection murmur   Peripheral Circulation: normal peripheral circulation   Pedal Pulses: normal symmetric pedal pulses   Lower Extremity Edema: +1 mm bilateral lower extremity edema to the mid shins legs have venous discoloration.  The left leg is wrapped from the knee distal to the ankle.  Abdominal Exam: Slightly protuberant, non-tender, no obvious masses, bowel sounds normal   Gait & Station: walks without assistance   Orientation: oriented to person, place and time   Affect & Mood: appropriate and sustained affect   Language and Memory: patient responsive and seems to comprehend information   Neurologic Exam: neurological assessment grossly intact   Vital signs were reviewed  Lower back incision dressing removed and replaced, moh's procedure tunneled    ? Cardiovascular Studies: echo 08/07/2020  1. Normal LV size and systolic function. LVEF 55-65%. Severe LVH.   2. Mildly dilated RV with reduced RV systolic function.   3. Mild to moderate bi-atrial dilatation.   4. Moderate to severe mitral and tricuspid regurgitation.   5. Aortic valve is calcified. Aortic valve leaflets appear to be severely restricted. MG 26 mmHG, PG 42 mmHg, AVA 0.6cm2, Vmax 3.3 m/sec. This may represent severe low flow, low gradient aortic valve stenosis.    Estimated peak systolic PA pressure of 46 mmHg?  Compared to echo from last year, gradients across the aortic valve are higher. PA pressure is higher.    ?    CHEST: 09/28/2020  1. Thickening and calcification of the aortic valve with a normal caliber   ascending thoracic aorta.   2. Development of several sub-5 mm nodules scattered throughout both   lungs. These are nonspecific though are most likely infectious or   inflammatory. In a low-risk patient no follow-up needed. In a high-risk   patient follow-up chest CT at 12 months is suggested. ?   3. Cardiomegaly and coronary artery disease   4. Pulmonary hypertension     ABDOMEN AND PELVIS:   1. Normal caliber abdominal aorta and common iliac arteries with moderate   diffuse atherosclerosis.   2. Moderate to severe focal stenosis at the origin of the SMA.   3. Moderate distal colonic diverticulosis. ?     6 Minute Walk Last Results 06/26/2020 08/16/2019   SpO2:  Pre-Activity 99 99   Pulse:  POST-Activity 94 122   BP:  POST-Activity 126/76 151/76   SpO2:  POST-Activity 99 96   Distance Walked in Meters 152.5 289.75       Problems Addressed Today  Encounter Diagnoses   Name Primary?   ? Aortic stenosis, mild Yes       Assessment and Plan   Wild-type amyloidosis:   -He has had a previous technetium pyrophosphate scan on 06/28/2019 that showed a 2-hour heart/contralateral lung ratio of 1.97 with visual semiquantitative grading grade 3, abnormal and highly suggestive of ATTR cardiac amyloidosis.   - He has had kappa 7.45, lambda 4.07 and free light chain ratio 1.83 on 07/25/2019  -He had serum electrophoresis that showed no paraprotein.   -See 6-minute walk noted above  -He has completed a KCC Q score that was 72.92 indicative of fair to good quality of life.    -He is on tafamidis 61 mg daily.    -  He had a baseline CPET on 7/19 2021, will repeat this on 10/09/2020  -He had repeat TC PYP on 06/11/2020, it showed 2-hour heart/contralateral lung ratio reduced to 1.7, visual semiquantitative grading scale remains at 3.  -Follow-up appointment with Dr. [name] July 14.[name]       Heart failure with preserved ejection fraction:  - He appears hypovolemic by lab draws yesterday.  He is scheduled to take Bumex 6 mg twice daily and metolazone 2.5 mg on Mondays, Wednesdays and Fridays.  He accidentally took 1 this morning.  His CardioMEMS reading today PA diastolic is 25 his Cardiomems reading has been escalating since his metolazone was discontinued, PA diastolic today is 27, it was 31 on 6/15.  -I called and spoke to Dr. Herma Carson.  [name] regarding his repeat point-of-care BMP that shows BUN 104 and creatinine 3.5.  He advised to hold diuretics and get a repeat chemistry on Monday.  Of note to him has been taking 40 mill equivalents of potassium twice daily and 60 M EQ once daily, will keep him on 68 M EQ once daily.    CardioMEMS in situ: -12/15/19 IMPLANT RHC:?  BP:?126/76 (99)  RA:?27  RV:?60/32  PA:?57/31 (42)  PCWP:?30  TPG:?12  PVR:?3.8  SVR:?1840.  CO (Fick):?3.1  CI (Fick):?1.7?  AVO2 Diff 7.18?  Daily CardioMEMS Readings  ?Goal PA Diastolic:  22  PA Diastolic Thresholds:  20-24    .  Severe aortic stenosis: The echo today shows worsening aortic stenosis, likely representing low flow low-gradient aortic stenosis.  -He has been evaluated by Dr. Paris Lore on 6/14  .  He had CT chest and abdomen with contrast on 7/1 that showed thickening and calcification of the aortic valve with a normal caliber ascending thoracic aorta, several sub-5 mm nodules scattered throughout both lungs, cardiomegaly, coronary artery disease and pulmonary hypertension.  I will send my note to Dr. Paris Lore, Helen Hashimoto and Dr. [name] to update them on his hypervolemia.    CKD stage III: Last creatinine on 7/5 was 4.02, repeat point-of-care creatinine today is 3.5.  Suspect some of worsening renal function may be due to contrast-induced nephropathy.  He follows with Dr. Mercie Eon in nephrology, was last seen in clinic on 12/20/2019.   Atrial fibrillation: He continues to take amiodarone 200 mg p.o.daily, he is on oral anticoagulation with Xarelto 15 mg daily, he denies any upper or lower GI bleeding symptoms.  He has been in atrial fibrillation since at least 2020, has had previous ablations.        have personally documented the HPI, exam and medical decision making.  Patient education: I reviewed recent lab results and current medications, medication instructions, discussed heart failure signs & symptoms,  low  sodium diet, fluid restriction and daily weights.I have instructed the patient on the plan of care and they verbalize understanding of the plan. Please see AVS for full patient teaching. Patient advised to call our office if s/he has any problems, questions, worsening symptoms, or concerns prior to the next appointment.   Thanks for allowing me to see this nice patient. If I can be of additional assistance, please don't hesitate to contact me.     [name] AGPCNP-BC, [facility]  Pager [phone]  Collaborating physician: Dr. Vanetta Shawl             Encounter Diagnoses   Name Primary?   ? Heart failure with preserved ejection fraction, unspecified HF chronicity (HCC) Yes     Total Time Today was 90  minutes in the following activities: Preparing to see the patient, Obtaining and/or reviewing separately obtained history, Performing a medically appropriate examination and/or evaluation, Counseling and educating the patient/family/caregiver, Ordering medications, tests, or procedures, Referring and communication with other health care professionals (when not separately reported), Documenting clinical information in the electronic or other health record, Independently interpreting results (not separately reported) and communicating results to the patient/family/caregiver and Care coordination (not separately reported)                    Current Medications (including today's revisions)  ? allopurinoL (ZYLOPRIM) 300 mg tablet Take 300 mg by mouth daily. Take with food.   ? amiodarone (CORDARONE) 200 mg tablet Take one tablet by mouth daily.   ? bumetanide (BUMEX) 2 mg tablet Take three tablets by mouth twice daily.   ? cholecalciferol (vitamin D3) (VITAMIN D3 PO) Take 1 tablet by mouth daily. Pt. Unsure of dosage   ? docusate (COLACE) 100 mg capsule Take 100 mg by mouth daily.   ? empagliflozin (JARDIANCE) 10 mg tablet Take one tablet by mouth daily.   ? esomeprazole DR(+) (NEXIUM) 20 mg capsule Take 20 mg by mouth daily. Take on an empty stomach at least 1 hour before or 2 hours after food.   ? ferrous sulfate (FEOSOL) 325 mg (65 mg iron) tablet Take 325 mg by mouth twice daily. Take on an empty stomach at least 1 hour before or 2 hours after food.    ? fluticasone/salmeterol (ADVAIR) 100/50 mcg inhalation disk Inhale 1 puff by mouth into the lungs daily.   ? lactobacillus rhamnosus (GG) (CULTURELLE) 10 billion cell cap Take 1 capsule by mouth daily with breakfast.   ? magnesium oxide (MAG-OX) 400 mg (241.3 mg magnesium) tablet Take one tablet by mouth daily.   ? metOLazone (ZAROXOLYN) 2.5 mg tablet Take one tablet by mouth three times weekly.   ? polyethylene glycol 3350 (MIRALAX) 17 g packet Take one packet by mouth daily.   ? potassium chloride SR (KLOR-CON M20) 20 mEq tablet Take two tablets by mouth three times daily. 09/26/2020 INCREASE dose to 40 mEq (two tablets) Twice daily and 60 mEq (3 tablets) one time a day for hypokalemia.   ? rivaroxaban (XARELTO) 15 mg tablet Take one tablet by mouth daily with breakfast. Take with food. Hold the day before and the morning of your CardioMEMS procedure.   ? rosuvastatin (CRESTOR) 5 mg tablet Take one tablet by mouth three times weekly.   ? senna/docusate (SENOKOT-S) 8.6/50 mg tablet Take one tablet by mouth as Needed.   ? sildenafiL (VIAGRA) 100 mg tablet TAKE 1 TABLET BY MOUTH ONCE DAILY AS NEEDED FOR SEXUAL ACTIVITY   ? tafamidis (VYNDAMAX) 61 mg capsule Take one capsule by mouth daily.   ? VENTOLIN HFA 90 mcg/actuation inhaler Inhale 1 puff by mouth into the lungs as Needed.

## 2020-10-04 NOTE — Progress Notes
Daily CardioMEMS Readings    Goal PA Diastolic: 22 mmHg   PA Diastolic Thresholds: 123456 mmHg

## 2020-10-05 ENCOUNTER — Encounter: Admit: 2020-10-05 | Discharge: 2020-10-05 | Payer: MEDICARE

## 2020-10-05 ENCOUNTER — Inpatient Hospital Stay: Admit: 2020-10-05 | Discharge: 2020-10-05 | Payer: MEDICARE

## 2020-10-05 DIAGNOSIS — I350 Nonrheumatic aortic (valve) stenosis: Secondary | ICD-10-CM

## 2020-10-05 NOTE — Patient Instructions
Dear Mr. Ray [name] have been scheduled for surgery with Dr. Azzie Glatter and Dr. Lorriane Shire on 7/27.    Per the recommendations of your doctors, you will need to be off your rivaroxaban (Xarelto) for 3 days before your procedure. Your last dose will be 7/23 (take). You should stop your Xarelto on 7/24 and after.      You may continue to take all of your other medications. You will be given instructions on any other medications to hold the day of surgery at your Pre-Op appointment.    Please reach out to valve clinic nurse coordinators, [place] or [place] with questions: [phone].    Sincerely,          Doralee, RN & Vikki Ports, RN

## 2020-10-08 ENCOUNTER — Encounter: Admit: 2020-10-08 | Discharge: 2020-10-08 | Payer: MEDICARE

## 2020-10-08 DIAGNOSIS — I5030 Unspecified diastolic (congestive) heart failure: Secondary | ICD-10-CM

## 2020-10-08 LAB — BASIC METABOLIC PANEL
ANION GAP: 16 — ABNORMAL HIGH (ref 3–13)
BLD UREA NITROGEN: 67 — ABNORMAL HIGH (ref 7–25)
CALCIUM: 9.1 (ref 8.6–10.3)
CHLORIDE: 90 — ABNORMAL LOW (ref 98–110)
CO2: 30 (ref 21–31)
CREATININE: 2.1 — ABNORMAL HIGH (ref 0.4–1.25)
GFR ESTIMATED: 34 — ABNORMAL LOW (ref >60)
GLUCOSE,PANEL: 170 — ABNORMAL HIGH (ref 65–99)
POTASSIUM: 3.2 — ABNORMAL LOW (ref 3.5–5.1)
SODIUM: 133 — ABNORMAL LOW (ref 135–145)

## 2020-10-08 MED ORDER — POTASSIUM CHLORIDE 20 MEQ PO TBTQ
40 meq | ORAL_TABLET | Freq: Three times a day (TID) | ORAL | 3 refills | 30.00000 days | Status: AC
Start: 2020-10-08 — End: ?

## 2020-10-08 NOTE — Telephone Encounter
-----   Message from Ray Running, APRN-NP sent at 10/08/2020 11:16 AM CDT -----  His renal function has come back to baseline, please restart Bumex 6 mg BID and restart KDUR 40 meq po BID.  Chem Friday.      Zubair, with his severe AS, ;should he have his CPET ?? Please advise.  DM  ----- Message -----  From: Tia Alert, RN  Sent: 10/08/2020   9:30 AM CDT  To: Ray Running, APRN-NP        Len Childs! - Routing these to you so you have full labs, (I copied the message I sent to you in patient messages as well, sorry for the duplicate).     Pt labs came back this morning (lab values from 7/5 in parentheses) :   K: 3.2 (4.2)   Creatinine: 2.1 (4.02)      Cardiomems goals: PAD 20-24 (22)   7/11: 29   7/10: 29   7/9: 26   7/8: 23   7/7: 24   7/6: 25   7/5: 26     Do you want to make any medication changes? Pt currently holding diuretics and taking Kcl 16mEq daily   Prior diuretic doses:   Bumex 6mg  BID   Metolazone 2.5mg  3 times weekly   Jardiance 10mg  daily   Potassium 34mEq TID     Do you still want pt to get PFTs tomorrow?     Per your note on 7/7:   "-He had a baseline CPET on 7/19 2021, will repeat this on 10/09/2020   We will let you know if you should keep your CPET appt on 7/12   As scheduled with Dr Manuella Ghazi with Echo prior on 7/14 at [facility]"

## 2020-10-08 NOTE — Telephone Encounter
Spoke with patient and relayed DM recommendations to restart Bumex and Potassium.     Spoke with ZUS, CPET cancelled. Pt informed CPET cancelled.    Pt verbalized understanding.

## 2020-10-09 ENCOUNTER — Encounter: Admit: 2020-10-09 | Discharge: 2020-10-09 | Payer: MEDICARE

## 2020-10-11 ENCOUNTER — Encounter: Admit: 2020-10-11 | Discharge: 2020-10-11 | Payer: MEDICARE

## 2020-10-11 ENCOUNTER — Ambulatory Visit: Admit: 2020-10-11 | Discharge: 2020-10-11 | Payer: MEDICARE

## 2020-10-11 DIAGNOSIS — E785 Hyperlipidemia, unspecified: Secondary | ICD-10-CM

## 2020-10-11 DIAGNOSIS — I350 Nonrheumatic aortic (valve) stenosis: Secondary | ICD-10-CM

## 2020-10-11 DIAGNOSIS — J302 Other seasonal allergic rhinitis: Secondary | ICD-10-CM

## 2020-10-11 DIAGNOSIS — I361 Nonrheumatic tricuspid (valve) insufficiency: Secondary | ICD-10-CM

## 2020-10-11 DIAGNOSIS — Z20822 Encounter for screening laboratory testing for COVID-19 virus in asymptomatic patient: Secondary | ICD-10-CM

## 2020-10-11 DIAGNOSIS — I358 Other nonrheumatic aortic valve disorders: Secondary | ICD-10-CM

## 2020-10-11 DIAGNOSIS — I10 Essential (primary) hypertension: Principal | ICD-10-CM

## 2020-10-11 DIAGNOSIS — I519 Heart disease, unspecified: Secondary | ICD-10-CM

## 2020-10-11 DIAGNOSIS — I5030 Unspecified diastolic (congestive) heart failure: Secondary | ICD-10-CM

## 2020-10-11 DIAGNOSIS — I340 Nonrheumatic mitral (valve) insufficiency: Secondary | ICD-10-CM

## 2020-10-11 DIAGNOSIS — C4490 Unspecified malignant neoplasm of skin, unspecified: Secondary | ICD-10-CM

## 2020-10-11 MED ORDER — ASPIRIN 81 MG PO CHEW
81 mg | Freq: Every day | ORAL | 0 refills
Start: 2020-10-11 — End: ?

## 2020-10-11 MED ORDER — METOLAZONE 2.5 MG PO TAB
ORAL_TABLET | ORAL | 1 refills | 84.00000 days | Status: AC
Start: 2020-10-11 — End: ?

## 2020-10-11 MED ORDER — DOXYCYCLINE HYCLATE 100 MG PO CAP
100 mg | ORAL_CAPSULE | Freq: Two times a day (BID) | ORAL | 0 refills | 8.00000 days | Status: AC
Start: 2020-10-11 — End: ?

## 2020-10-11 MED ORDER — LACTATED RINGERS IV SOLP
INTRAVENOUS | 0 refills
Start: 2020-10-11 — End: ?

## 2020-10-11 MED ORDER — LIDOCAINE (PF) 10 MG/ML (1 %) IJ SOLN
.2 mL | INTRAMUSCULAR | 0 refills | PRN
Start: 2020-10-11 — End: ?

## 2020-10-11 NOTE — Progress Notes
Date of Service: 10/11/2020    Ray [name] is a 85 y.o. male.       HPI     I had the pleasure of seeing Ray [name], who is accompanied by his wife Ray [name] today in [place] [facility] clinic for wild-type amyloidosis follow-up. He has implanted Cardiomems, his PAD's had been above threshold.He has medical history that is significant for wild-type amyloidosis, Cardiomems in situ, atrial fibrillation status post ablation, basal cell skin cancer and CKD stage III.  ?  He was last seen in clinic on 6/14 by Dr. Paris Lore for evaluation for possible TAVR, prior to that he had seen Dr. [name] on 6/1 and I had last seen him on 6/16, at that office visit I had increased his Bumex to 6 mg twice daily and had adjusted his potassium.  Of note he had labs drawn on 7/5 that showed an increase in creatinine from 2.76-4.02.  Of note he had CT chest with and without contrast and abdomen on 7/1.  Our Cardiomems coordinator Darl Pikes let me know his chemistry results, in addition he had not been urinating very much between July 2 through 5 and was feeling full early, had early satiety, peripheral edema and shortness of breath. His last PA diastolic was on 7/6 was 25, thresholds are 20-24.  ?  He is having significant pain in the left leg.  Left leg swollen also.  PA diastolic pressure  28 mmHg    Vitals:    10/11/20 0954   BP: (!) 160/72   BP Source: Arm, Left Upper   Pulse: 61   SpO2: 98%   PainSc: Zero   Weight: 70.3 kg (155 lb)   Height: 165.1 cm (5' 5)     Body mass index is 25.79 kg/m?[name]     Past Medical History  Patient Active Problem List    Diagnosis Date Noted   ? Nonrheumatic mitral valve regurgitation 09/06/2020   ? Nonrheumatic tricuspid valve regurgitation 09/06/2020   ? Nonrheumatic aortic valve stenosis 09/06/2020   ? Asymptomatic microscopic hematuria 12/25/2019   ? S/P left pulmonary artery pressure sensor implant placement 12/15/2019     *Patient has CardioMEMS (Pulmonary Artery Pressure Sensor)* Please call [name], CardioMEMs Program Coordinator [phone] or page Heart Failure Rounding Team if patient is admitted or presents to ED*          ? Chronic heart failure with preserved ejection fraction (HCC) 12/15/2019   ? Acute on chronic heart failure with preserved ejection fraction (HFpEF) (HCC) 11/14/2019   ? 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  ?  01/26/2019 Echo EF:?60%  IVS 1.50 cm (Range: 0.6 - 1.0)         LV PW 1.40 cm (Range: 0.6 - 1.0)         ?  1. Moderate concentric left ventricular hypertrophy  2. Unable to assess diastolic function due to atrial fibrillation  3. Moderate biatrial dilatation  4. Normal right ventricular size and systolic function  5. Normal central venous pressure  6. Mild calcific aortic valve stenosis. ?Mean gradient is 16 mmHg with a peak velocity of 2.8 m/s. ?Trace aortic valve regurgitation is seen  7. Mitral valve is structurally okay. ?There is moderate central regurgitation. ?No stenosis  8. Moderate tricuspid valve regurgitation  9. Pulmonary artery static pressure measures at 33 mmHg   06/28/2019 TC PYP Multiview planar views and SPECT imaging confirms the presence of severe marked  diffuse global left ventricular and right ventricular myocardial uptake of tracer. ?There is normal rib and bone uptake.  ?  Two hour heart/contralateral lung (H/CL) ratio: ?1.977?this metric is abnormal and highly suggestive of ATTR cardiac amyloidosis.?  ?  Visual Semi-Quantitative Grading Scale Analysis:?  The study is grade 3 indicating myocardial uptake greater than rib and bone uptake. ?The pattern is highly and strongly suggestive of ATTR cardiac amyloidosis.  ?  SUMMARY/OPINION:   This study is abnormal and strongly suggestive of ATTR cardiac amyloidosis. ?Left ventricular myocardial uptake is diffuse, intense, and global. ?There is also prominent right ventricular free wall uptake of tracer. ?The pattern is typical for ATTR cardiac amyloidosis   ? Cardiac MRI ?   07/25/2019 Kappa/Lambda FLC ?  ? Ref. Range 07/25/2019 15:48   Kappa, FLC Latest Ref Range: 0.33 - 1.94 MG/DL 1.61 (H)   Lambda, FLC Latest Ref Range: 0.57 - 2.63 MG/DL 0.96 (H)   Kappa/Lambda FLC Latest Ref Range: 0.26 - 1.65  1.83 (H)      07/25/2019 Serum Immunofixation ?NO PARAPROTEIN SEEN   cancelled Urine Immunofixation ?Patient unable to provide urine sample 4/26    07/25/2019 Genetic Testing ?Negative   ? Biopsy (Fat Pad, Cardiac, Bone Marrow) ?   ? GI Symptoms ?   ?+? 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   ?  ? Ref. Range 07/25/2019 15:48   B Type Natriuretic Peptide Latest Ref Range: 0 - 100 PG/ML 966.0 (H)   Troponin-I Latest Ref Range: 0.0 - 0.05 NG/ML 0.05   ?  Completed:  -Tafamidis start 08/2019. PA approved, pt approved for [employer] patient assistance from 09/01/2019 to 03/30/2020  -Baseline KCCQ12 completed 5/18/202, Summary Score 61.46  -Baseline 6 min walk completed 08/16/2019, distance walked 950 feet, 289.75 meters, duration of test 6 minutes  -Baseline CPET completed 10/17/2019       ? S/P ablation of atrial fibrillation 05/18/2018   ? Lightheadedness 03/16/2018   ? Hospitalization within last 30 days 12/24/2017   ? Atrial fibrillation with RVR (HCC) 12/14/2017   ? Chronic kidney disease, stage 4 (severe) (HCC) 12/13/2017   ? Cardiogenic shock (HCC) 12/12/2017   ? Bradycardia 12/12/2017   ? Bilateral leg edema 12/11/2017   ? On amiodarone therapy 12/11/2017   ? Heart failure with preserved ejection fraction (HCC) 01/01/2016   ? NSVT (nonsustained ventricular tachycardia) (HCC) 06/12/2015   ? Medication side effects 11/09/2014   ? Heart palpitations 09/19/2014   ? Longstanding persistent atrial fibrillation (HCC) 03/17/2013 03/26/2018 - ECHO:  Moderate concentric LVH seen. The EF is about 55%.  The atria are slightly dilated.  The RV function is mildly decreased.  Mild to moderate MR and TR seen.  Mild AI is present.  Based on the gradients and appearance, the AS appears to be mild. The mean gradient is 16 mm Hg with a peak velocity of 2.6 m/s.  03/26/2018 - Zio Patch:  Predominant rhythm was normal sinus.  Two runs of ventricular tachycardia occurred, they were monomorphic, the longest run lasted 3 seconds, this episode occurred on April 08, 2018 at 9:41 PM.  One episode of AIVR also appeared to be present, it lasted approximately 4.6 seconds  Brief and rare episodes of SVT that probably represent atrial fibrillation, the longest lasting 6.3 seconds.  No  evidence of atrial flutter and no evidence of high degree AV block.  05/12/2018 - LAAA:  Persistent Atrial Fibrillation status post successful pulmonary vein antral isolation.  Cavotricuspid Isthmus Ablation.  Ablation of Fractionated Intracardiac Electrograms.  Ablation for an Additional Discrete Arrhythmia-LA AFL after AFIB/PVI Ablation  06/23/2018 - Cardioversion:  Successful direct current cardioversion from symptomatic atrial fibrillation to sinus rhythm.  03/16/2019 - DCCV:  Successful direct current cardioversion from atrial fibrillation to sinus rhythm.     ? Systolic murmur of aorta 02/04/2013   ? SOB (shortness of breath) 02/04/2013   ? Carotid bruit present 01/23/2012   ? Overweight (BMI 25.0-29.9) 01/30/2011   ? Essential hypertension 01/30/2009   ? Hyperlipidemia 01/30/2009     Hyperlipidemia, on statin therapy     ? Skin cancer 01/30/2009   ? Seasonal allergic reaction 01/30/2009         Review of Systems   Constitutional: Negative.   HENT: Negative.    Eyes: Negative.    Cardiovascular: Positive for claudication and leg swelling.   Respiratory: Negative.    Endocrine: Negative.    Hematologic/Lymphatic: Negative.    Skin: Negative.    Musculoskeletal: Negative. Gastrointestinal: Negative.    Genitourinary: Negative.    Neurological: Negative.    Psychiatric/Behavioral: Negative.    Allergic/Immunologic: Negative.        Physical Exam  General Appearance: resting comfortably, no acute distress  Skin: warm, moist, no evident rashes   Digits and Nails: no clubbing   Eyes: conjunctivae and lids normal, pupils are equal and round   Lips & Oral Mucosa: no pallor or cyanosis   Ear, Nose, Throat: No deformities   Neck veins: neck veins  distended JVP around 10 to 12 cm carotid Arteries: normal carotid upstroke bilaterally, no bruits   Chest Inspection: chest is normal in appearance   Lung Auscultation: lungs with normal breath sounds, no rhonchi, and and expiratory wheezes  PMI: PMI not enlarged or displaced   Cardiac Rhythm: regular rhythm and normal rate   Cardiac Auscultation: Normal S1 & S2, no S3 or S4, no rubs or clicks   Murmurs: no significant cardiac murmur appreciated   Abdominal Exam: soft, non-tender, no masses, bowel sounds normal   Pedal Pulses: normal symmetric pedal pulses   Lower Extremities: +2 edema of the left leg.  +1 edema right leg  Muscle Strength: grossly normal   Neurologic Exam: neurological assessment grossly intact   Orientation: oriented to time, place and person   Affect & Mood: appropriate and sustained affect    Cardiovascular Studies      Cardiovascular Health Factors  Vitals BP Readings from Last 3 Encounters:   10/11/20 (!) 160/72   10/04/20 128/60   09/17/20 110/70     Wt Readings from Last 3 Encounters:   10/11/20 70.3 kg (155 lb)   10/04/20 67.8 kg (149 lb 6.4 oz)   09/17/20 68.5 kg (151 lb 0.2 oz)     BMI Readings from Last 3 Encounters:   10/11/20 25.79 kg/m?   10/04/20 24.86 kg/m?   09/17/20 25.91 kg/m?      Smoking Social History     Tobacco Use   Smoking Status Never Smoker   Smokeless Tobacco Never Used      Lipid Profile Cholesterol   Date Value Ref Range Status   09/17/2020 145 <200 MG/DL Final     HDL   Date Value Ref Range Status 09/17/2020 64 >40 MG/DL Final     LDL  Date Value Ref Range Status   09/17/2020 72 <100 mg/dL Final     Triglycerides   Date Value Ref Range Status   09/17/2020 87 <150 MG/DL Final      Blood Sugar Hemoglobin A1C   Date Value Ref Range Status   09/20/2020 5.9  Final     Glucose   Date Value Ref Range Status   10/08/2020 170 (H) 65 - 99 Final   10/02/2020 145 (H) 65 - 99 Final   09/24/2020 96 65 - 99 Final     Glucose, POC   Date Value Ref Range Status   10/04/2020 139 (H) 70 - 100 MG/DL Final   16/12/9602 540 (H) 70 - 100 MG/DL Final   98/01/9146 829 (H) 70 - 100 MG/DL Final   56/21/3086 578 (H) 70 - 100 MG/DL Final          Problems Addressed Today  Encounter Diagnoses   Name Primary?   ? Heart failure with preserved ejection fraction, unspecified HF chronicity (HCC) Yes       Assessment and Plan    #1.  Wild-type cardiac amyloidosis  #2.  Heart failure with preserved ejection fraction  #3.  Severe aortic stenosis      Ray [name] comes with acute volume overload.  PA diastolic pressure is 28 mmHg's.  I took a look at his echocardiogram images his IVC is 2 cm and fixed.  Labs are indicating worsening creatinine of 3.2.  BNP elevated.  Definitely has a lot of volume overload.  I will give him an option of hospitalization versus infusion center however he wants to try metolazone since it has been working in the past    I recommend to take metolazone with Bumex dose he is currently on.  He should take 2.5 mg today and tomorrow.  Continue Bumex on Sunday however not metolazone.  Labs on Monday morning.  I think creatinine around 2 is his baseline now.  See Dana Miller next week.    He is scheduled to undergo aortic valve replacement this month.    I believe he will benefit from IV Onpattro open label extension of Apollo B since his amyloid seems to have worsened.  We will discuss this with the patient after TAVR.      We will put him on doxycycline 100 mg twice daily for 5 days in anticipation of lower extremity cellulitis.      60  minutes of time spent with patient and family.  Greater than 30 minutes of this time spent counseling regarding heart failure with reduced ejection fraction, medications and volume control.  Vanetta Shawl M.D  [facility] Cardiologist        Current Medications (including today's revisions)  ? allopurinoL (ZYLOPRIM) 300 mg tablet Take 300 mg by mouth daily. Take with food.   ? amiodarone (CORDARONE) 200 mg tablet Take one tablet by mouth daily.   ? bumetanide (BUMEX) 2 mg tablet Take three tablets by mouth twice daily.   ? cholecalciferol (vitamin D3) (VITAMIN D3 PO) Take 1 tablet by mouth daily. Pt. Unsure of dosage   ? docusate (COLACE) 100 mg capsule Take 100 mg by mouth daily.   ? empagliflozin (JARDIANCE) 10 mg tablet Take one tablet by mouth daily.   ? esomeprazole DR(+) (NEXIUM) 20 mg capsule Take 20 mg by mouth daily. Take on an empty stomach at least 1 hour before or 2 hours after food.   ? ferrous sulfate (FEOSOL) 325 mg (  65 mg iron) tablet Take 325 mg by mouth twice daily. Take on an empty stomach at least 1 hour before or 2 hours after food.    ? fluticasone/salmeterol (ADVAIR) 100/50 mcg inhalation disk Inhale 1 puff by mouth into the lungs daily.   ? lactobacillus rhamnosus (GG) (CULTURELLE) 10 billion cell cap Take 1 capsule by mouth daily with breakfast.   ? magnesium oxide (MAG-OX) 400 mg (241.3 mg magnesium) tablet Take one tablet by mouth daily.   ? metOLazone (ZAROXOLYN) 2.5 mg tablet Take one tablet by mouth three times weekly.   ? polyethylene glycol 3350 (MIRALAX) 17 g packet Take one packet by mouth daily. (Patient taking differently: Take 17 g by mouth as Needed.)   ? potassium chloride SR (KLOR-CON M20) 20 mEq tablet Take two tablets by mouth three times daily.   ? rivaroxaban (XARELTO) 15 mg tablet Take one tablet by mouth daily with breakfast. Take with food. Hold the day before and the morning of your CardioMEMS procedure.   ? rosuvastatin (CRESTOR) 5 mg tablet Take one tablet by mouth three times weekly.   ? sildenafiL (VIAGRA) 100 mg tablet TAKE 1 TABLET BY MOUTH ONCE DAILY AS NEEDED FOR SEXUAL ACTIVITY   ? tafamidis (VYNDAMAX) 61 mg capsule Take one capsule by mouth daily.   ? VENTOLIN HFA 90 mcg/actuation inhaler Inhale 1 puff by mouth into the lungs as Needed.

## 2020-10-11 NOTE — Progress Notes
Daily CardioMEMS Readings    Goal PA Diastolic: 22 mmHg   PA Diastolic Thresholds: 123456 mmHg

## 2020-10-12 ENCOUNTER — Encounter: Admit: 2020-10-12 | Discharge: 2020-10-12 | Payer: MEDICARE

## 2020-10-12 MED FILL — VYNDAMAX 61 MG PO CAP: 61 mg | ORAL | 30 days supply | Qty: 30 | Fill #3 | Status: AC

## 2020-10-15 ENCOUNTER — Encounter: Admit: 2020-10-15 | Discharge: 2020-10-15 | Payer: MEDICARE

## 2020-10-15 ENCOUNTER — Ambulatory Visit: Admit: 2020-10-15 | Discharge: 2020-10-15 | Payer: MEDICARE

## 2020-10-15 DIAGNOSIS — I10 Essential (primary) hypertension: Secondary | ICD-10-CM

## 2020-10-15 DIAGNOSIS — I350 Nonrheumatic aortic (valve) stenosis: Secondary | ICD-10-CM

## 2020-10-15 DIAGNOSIS — E785 Hyperlipidemia, unspecified: Secondary | ICD-10-CM

## 2020-10-15 DIAGNOSIS — I340 Nonrheumatic mitral (valve) insufficiency: Secondary | ICD-10-CM

## 2020-10-15 DIAGNOSIS — E8582 Wild-type transthyretin-related (ATTR) amyloidosis: Secondary | ICD-10-CM

## 2020-10-15 DIAGNOSIS — I5030 Unspecified diastolic (congestive) heart failure: Secondary | ICD-10-CM

## 2020-10-15 DIAGNOSIS — C4490 Unspecified malignant neoplasm of skin, unspecified: Secondary | ICD-10-CM

## 2020-10-15 DIAGNOSIS — J302 Other seasonal allergic rhinitis: Secondary | ICD-10-CM

## 2020-10-15 DIAGNOSIS — I361 Nonrheumatic tricuspid (valve) insufficiency: Secondary | ICD-10-CM

## 2020-10-15 DIAGNOSIS — I358 Other nonrheumatic aortic valve disorders: Secondary | ICD-10-CM

## 2020-10-15 LAB — BASIC METABOLIC PANEL
ANION GAP: 19 — ABNORMAL HIGH (ref 0–14)
BLD UREA NITROGEN: 79 — ABNORMAL HIGH (ref 8.4–25.7)
CALCIUM: 9.3 (ref 8.8–10)
CO2: 28 mL/min — ABNORMAL LOW (ref 23–31)
CREATININE: 2.3 — ABNORMAL HIGH (ref 0.72–1.25)
GFR ESTIMATED: 27 — ABNORMAL LOW (ref >59)
GLUCOSE,PANEL: 126 (ref 70–105)
POTASSIUM: 3.1 U/L — ABNORMAL LOW (ref 3.5–5.1)

## 2020-10-15 MED ORDER — POTASSIUM CHLORIDE 20 MEQ PO TBTQ
ORAL_TABLET | Freq: Two times a day (BID) | ORAL | 3 refills | 30.00000 days | Status: AC
Start: 2020-10-15 — End: ?

## 2020-10-15 NOTE — Telephone Encounter
Call to pt with recs from [place], pt verb understanding.  Pt asks if he is to take metolazone prn again, had been stopped @ last OV with revel today.  Advised pt will send message to [facility] for recs.  Pt verb understanding.

## 2020-10-15 NOTE — Progress Notes
Daily CardioMEMS Readings    Goal PA Diastolic: 22 mmHg   PA Diastolic Thresholds: 123456 mmHg

## 2020-10-15 NOTE — Telephone Encounter
-----   Message from Geronimo Running, APRN-NP sent at 10/15/2020  4:14 PM CDT -----  Ray [name] repeat chemistry shows a potassium of 3.1, and his creatinine was 2.39 down from 7/14.  He has been taking potassium 40 M EQ 3 times daily, please increase the potassium to 60 M EQ twice daily and 40 M EQ once daily, repeat chemistry on either Friday or Monday.  Which you let them know?  Thank you, DM.

## 2020-10-15 NOTE — Progress Notes
ThanDate of Service: 10/15/2020    Ray [name] is a 85 y.o. male.       HPI      I had the pleasure of seeing Ray [name], who is accompanied by his wife Ray [name] today in [place] [facility] clinic for wild-type amyloidosis follow-up.     He has medical history that is significant for wild-type amyloidosis, Cardiomems in situ, atrial fibrillation status post ablation, severe aortic stenosis,basal cell skin cancer and CKD stage III.    He was last seen in clinic on 7/14 by Dr. [name].  He had ordered doxycycline for left leg wound that has significantly improved since he started it.  In addition he ordered him to take metolazone 2.5 mg on Friday and Saturday and get repeat labs today.      Today Ray [name] reports that the swelling in his legs has improved.  Home weight was 148 pounds yesterday and 149 pounds today.  He reports that his breathing is doing well, he is a little bit lightheaded, he wondered if the doxycycline had anything to do with that.  He tells me his home blood pressure today was 120/65 with a pulse of 78.  He denies orthopnea or PND.       Medical History:   Diagnosis Date   ? 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 Essential hypertension 01/30/2009   ? Hyperlipidemia 01/30/2009   ? Hypertension 01/30/2009   ? Nonrheumatic aortic valve stenosis 09/06/2020   ? Nonrheumatic mitral valve regurgitation 09/06/2020   ? Nonrheumatic tricuspid valve regurgitation 09/06/2020   ? Seasonal allergic reaction 01/30/2009   ? Skin cancer 01/30/2009     Surgical History:   Procedure Laterality Date   ? INTRACARDIAC CATHETER ABLATION WITH COMPREHENSIVE ELECTROPHYSIOLOGIC EVALUATION - ATRIAL FIBRILLATION N/A 05/12/2018    Performed by Jen Mow, MD at [facility] EP LAB   ? CATHETERIZATION RIGHT HEART WITH INSERTION PULMONARY ARTERY SENSOR N/A 12/15/2019    Performed by Harley Alto, MD at [facility] CATH LAB   ? ANGIOGRAPHY CORONARY ARTERY WITH LEFT HEART CATHETERIZATION N/A 09/17/2020    Performed by Harley Alto, MD at [facility] CATH LAB   ? POSSIBLE PERCUTANEOUS CORONARY STENT PLACEMENT WITH ANGIOPLASTY N/A 09/17/2020    Performed by Harley Alto, MD at [facility] CATH LAB     Allergies   Allergen Reactions   ? Beta Blockers [Beta-Blockers (Beta-Adrenergic Blocking Agts)] SHORTNESS OF BREATH and SEE COMMENTS     Pt had beta blocker toxicity from one dose of carvedilol     Family History   Problem Relation Age of Onset   ? Stroke Father             Vitals:    10/15/20 1038   BP: 120/72   BP Source: Arm, Left Upper   Pulse: 76   Temp: 36.1 ?C (97 ?F)   SpO2: 99%   O2 Device: None (Room air)   TempSrc: Skin   PainSc: Zero   Weight: 68.4 kg (150 lb 12.8 oz)   Height: 165.1 cm (5' 5)     Body mass index is 25.09 kg/m?[name]     Past Medical History  Patient Active Problem List    Diagnosis Date Noted   ? 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  ?  01/26/2019 Echo EF:?60%  IVS 1.50 cm (Range: 0.6 - 1.0)         LV PW 1.40  cm (Range: 0.6 - 1.0)         ?  1. Moderate concentric left ventricular hypertrophy  2. Unable to assess diastolic function due to atrial fibrillation  3. Moderate biatrial dilatation  4. Normal right ventricular size and systolic function  5. Normal central venous pressure  6. Mild calcific aortic valve stenosis. ?Mean gradient is 16 mmHg with a peak velocity of 2.8 m/s. ?Trace aortic valve regurgitation is seen  7. Mitral valve is structurally okay. ?There is moderate central regurgitation. ?No stenosis  8. Moderate tricuspid valve regurgitation  9. Pulmonary artery static pressure measures at 33 mmHg   06/28/2019 TC PYP Multiview planar views and SPECT imaging confirms the presence of severe marked diffuse global left ventricular and right ventricular myocardial uptake of tracer. ?There is normal rib and bone uptake.  ?  Two hour heart/contralateral lung (H/CL) ratio: ?1.977?this metric is abnormal and highly suggestive of ATTR cardiac amyloidosis.?  ?  Visual Semi-Quantitative Grading Scale Analysis:?  The study is grade 3 indicating myocardial uptake greater than rib and bone uptake. ?The pattern is highly and strongly suggestive of ATTR cardiac amyloidosis.  ?  SUMMARY/OPINION:   This study is abnormal and strongly suggestive of ATTR cardiac amyloidosis. ?Left ventricular myocardial uptake is diffuse, intense, and global. ?There is also prominent right ventricular free wall uptake of tracer. ?The pattern is typical for ATTR cardiac amyloidosis   ? Cardiac MRI ?   07/25/2019 Kappa/Lambda FLC ?  ? Ref. Range 07/25/2019 15:48   Kappa, FLC Latest Ref Range: 0.33 - 1.94 MG/DL 1.61 (H)   Lambda, FLC Latest Ref Range: 0.57 - 2.63 MG/DL 0.96 (H)   Kappa/Lambda FLC Latest Ref Range: 0.26 - 1.65  1.83 (H)      07/25/2019 Serum Immunofixation ?NO PARAPROTEIN SEEN   cancelled Urine Immunofixation ?Patient unable to provide urine sample 4/26    07/25/2019 Genetic Testing ?Negative   ? Biopsy (Fat Pad, Cardiac, Bone Marrow) ?   ? GI Symptoms ?   ?+? 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   ?  ? Ref. Range 07/25/2019 15:48   B Type Natriuretic Peptide Latest Ref Range: 0 - 100 PG/ML 966.0 (H)   Troponin-I Latest Ref Range: 0.0 - 0.05 NG/ML 0.05   ?  Completed:  -Tafamidis start 08/2019. PA approved, pt approved for [employer] patient assistance from 09/01/2019 to 03/30/2020  -Baseline KCCQ12 completed 5/18/202, Summary Score 61.46  -Baseline 6 min walk completed 08/16/2019, distance walked 950 feet, 289.75 meters, duration of test 6 minutes  -Baseline CPET completed 10/17/2019       ? Nonrheumatic mitral valve regurgitation 09/06/2020   ? Nonrheumatic tricuspid valve regurgitation 09/06/2020   ? Nonrheumatic aortic valve stenosis 09/06/2020   ? Asymptomatic microscopic hematuria 12/25/2019   ? S/P left pulmonary artery pressure sensor implant placement 12/15/2019     *Patient has CardioMEMS (Pulmonary Artery Pressure Sensor)* Please call [name], CardioMEMs Program Coordinator [phone] or page Heart Failure Rounding Team if patient is admitted or presents to ED*          ? Chronic heart failure with preserved ejection fraction (HCC) 12/15/2019   ? Acute on chronic heart failure with preserved ejection fraction (HFpEF) (HCC) 11/14/2019   ? S/P ablation of atrial fibrillation 05/18/2018   ? Lightheadedness 03/16/2018   ?  Hospitalization within last 30 days 12/24/2017   ? Atrial fibrillation with RVR (HCC) 12/14/2017   ? Chronic kidney disease, stage 4 (severe) (HCC) 12/13/2017   ? Cardiogenic shock (HCC) 12/12/2017   ? Bradycardia 12/12/2017   ? Bilateral leg edema 12/11/2017   ? On amiodarone therapy 12/11/2017   ? Heart failure with preserved ejection fraction (HCC) 01/01/2016   ? NSVT (nonsustained ventricular tachycardia) (HCC) 06/12/2015   ? Medication side effects 11/09/2014   ? Heart palpitations 09/19/2014   ? Longstanding persistent atrial fibrillation (HCC) 03/17/2013     03/26/2018 - ECHO:  Moderate concentric LVH seen. The EF is about 55%.  The atria are slightly dilated.  The RV function is mildly decreased.  Mild to moderate MR and TR seen.  Mild AI is present.  Based on the gradients and appearance, the AS appears to be mild. The mean gradient is 16 mm Hg with a peak velocity of 2.6 m/s.  03/26/2018 - Zio Patch:  Predominant rhythm was normal sinus.  Two runs of ventricular tachycardia occurred, they were monomorphic, the longest run lasted 3 seconds, this episode occurred on April 08, 2018 at 9:41 PM.  One episode of AIVR also appeared to be present, it lasted approximately 4.6 seconds  Brief and rare episodes of SVT that probably represent atrial fibrillation, the longest lasting 6.3 seconds.  No evidence of atrial flutter and no evidence of high degree AV block.  05/12/2018 - LAAA:  Persistent Atrial Fibrillation status post successful pulmonary vein antral isolation.  Cavotricuspid Isthmus Ablation.  Ablation of Fractionated Intracardiac Electrograms.  Ablation for an Additional Discrete Arrhythmia-LA AFL after AFIB/PVI Ablation  06/23/2018 - Cardioversion:  Successful direct current cardioversion from symptomatic atrial fibrillation to sinus rhythm.  03/16/2019 - DCCV:  Successful direct current cardioversion from atrial fibrillation to sinus rhythm.     ? Systolic murmur of aorta 02/04/2013   ? SOB (shortness of breath) 02/04/2013   ? Carotid bruit present 01/23/2012   ? Overweight (BMI 25.0-29.9) 01/30/2011   ? Essential hypertension 01/30/2009   ? Hyperlipidemia 01/30/2009     Hyperlipidemia, on statin therapy     ? Skin cancer 01/30/2009   ? Seasonal allergic reaction 01/30/2009         Review of Systems   Constitutional: Negative.   HENT: Negative.    Eyes: Negative.    Cardiovascular: Negative.    Respiratory: Negative.    Endocrine: Negative.    Hematologic/Lymphatic: Negative.    Skin: Negative.    Musculoskeletal: Negative.    Gastrointestinal: Positive for constipation.   Genitourinary: Negative.    Neurological: Negative.    Psychiatric/Behavioral: Negative.    Allergic/Immunologic: Negative.        Physical Exam  General Appearance: In NAD.   Neck Veins: 7 cm JVP, neck veins are distended, + HJR   Chest Inspection: chest is normal in appearance   Respiratory Effort: breathing comfortably, no respiratory distress   Auscultation/Percussion: lungs clear to auscultation, no rales, rhonchi or wheezing   Cardiac Rhythm: irregularly irregular rhythm and normal rate   Cardiac Auscultation: S1, S2 normal, no rub, no definite S3  or S4   Murmurs: grade iii/vi systolic ejection murmur   Peripheral Circulation: normal peripheral circulation   Pedal Pulses: normal symmetric pedal pulses   Lower Extremity Edema: Trace peripheral edema. Venous discoloration.  Wound drainage noted, appears to have healed very well.  Abdominal Exam: Slightly protuberant, non-tender, no obvious masses, bowel sounds normal   Gait &  Station: walks without assistance   Orientation: oriented to person, place and time   Affect & Mood: appropriate and sustained affect   Language and Memory: patient responsive and seems to comprehend information   Neurologic Exam: neurological assessment grossly intact   Vital signs were reviewed      Cardiovascular Studies: echo 10/11/2020   ? ?The left ventricular size is normal. Moderate concentric hypertrophy. The left ventricular systolic function is low normal. The ejection fraction by Simpson's biplane method is 50%. There are no segmental wall motion abnormalities.  ? The right ventricle is moderately dilated. The right ventricular systolic function is mildly reduced.  ? Moderate to severe tricuspid and mitral valve regurgitation.  ? The estimated PASP 72 mmHg with elevated central pressures.  ? Paradoxical low flow low gradient likely severe aortic valve stenosis with mean gradient of 20 mmHg, SVI - 18.     There is no significant change compared to prior study (08/07/2020 ).   ?      CHEST: 09/28/2020  1. Thickening and calcification of the aortic valve with a normal caliber   ascending thoracic aorta.   2. Development of several sub-5 mm nodules scattered throughout both   lungs. These are nonspecific though are most likely infectious or   inflammatory. In a low-risk patient no follow-up needed. In a high-risk   patient follow-up chest CT at 12 months is suggested. ?   3. Cardiomegaly and coronary artery disease   4. Pulmonary hypertension     ABDOMEN AND PELVIS:   1. Normal caliber abdominal aorta and common iliac arteries with moderate   diffuse atherosclerosis.   2. Moderate to severe focal stenosis at the origin of the SMA.   3. Moderate distal colonic diverticulosis. ?     6 Minute Walk Last Results 06/26/2020 08/16/2019   SpO2: Pre-Activity 99 99   Pulse:  POST-Activity 94 122   BP:  POST-Activity 126/76 151/76   SpO2:  POST-Activity 99 96   Distance Walked in Meters 152.5 289.75       Problems Addressed Today  Encounter Diagnoses   Name Primary?   ? Aortic stenosis, mild Yes       Assessment and Plan   Wild-type ATTR amyloidosis:   -He has had a previous technetium pyrophosphate scan on 06/28/2019 that showed a 2-hour heart/contralateral lung ratio of 1.97 with visual semiquantitative grading grade 3, abnormal and highly suggestive of ATTR cardiac amyloidosis.   - He has had kappa 7.45, lambda 4.07 and free light chain ratio 1.83 on 07/25/2019  -He had serum electrophoresis that showed no paraprotein.   -See 6-minute walk noted above  -He has completed a KCC Q score that was 72.92 indicative of fair to good quality of life.    -He is on tafamidis 61 mg daily.    -He had a baseline CPET on 7/19 2021, will repeat this on 10/09/2020  -He had repeat TC PYP on 06/11/2020, it showed 2-hour heart/contralateral lung ratio reduced to 1.7, visual semiquantitative grading scale remains at 3.  -Follow-up appointment with Dr. [name] on 9/6, I will plan to see patient in August.  Of note Dr. [name] had talked to him about potentially starting Onpattro for open label extension of the Apollo B trial.       Heart failure with preserved ejection fraction:  - He appears euvolemic on physical exam.  He is currently taking Bumex 6 mg twice daily and metolazone as instructed.  His CardioMEMS reading today  was 27, it was 30 on 7/17.  He had labs drawn this morning, they were not available at the time of this office visit.  Likely he will need a dose of metolazone later this week.  CardioMEMS in situ: -12/15/19 IMPLANT RHC:?  BP:?126/76 (99)  RA:?27  RV:?60/32  PA:?57/31 (42)  PCWP:?30  TPG:?12  PVR:?3.8  SVR:?1840.  CO (Fick):?3.1  CI (Fick):?1.7?  AVO2 Diff 7.18?  Daily CardioMEMS Readings  ?Goal PA Diastolic:  22  PA Diastolic Thresholds:  20-24    .  Severe aortic stenosis: The echo today shows worsening aortic stenosis, likely representing low flow low-gradient aortic stenosis.  -He has been evaluated by Dr. Paris Lore on 6/14  - He had CT chest and abdomen with contrast on 7/1 that showed thickening and calcification of the aortic valve with a normal caliber ascending thoracic aorta, several sub-5 mm nodules scattered throughout both lungs, cardiomegaly, coronary artery disease and pulmonary hypertension.    -He is scheduled for preanesthesia testing on 7/25 and TAVR on 7/27.    CKD stage III: Last creatinine on 7/14 was 3.2, he had labs drawn this morning at [facility], will await these lab results. [name]  He follows with Dr. Mercie Eon in nephrology, was last seen in clinic on 12/20/2019.   Atrial fibrillation: He continues to take amiodarone 200 mg p.o.daily, he is on oral anticoagulation with Xarelto 15 mg daily, he denies any upper or lower GI bleeding symptoms.  He has been in atrial fibrillation since at least 2020, has had previous ablations.       I have personally documented the HPI, exam and medical decision making.  Patient education: I reviewed recent lab results and current medications, medication instructions, discussed heart failure signs & symptoms,  low  sodium diet, fluid restriction and daily weights.I have instructed the patient on the plan of care and they verbalize understanding of the plan. Please see AVS for full patient teaching. Patient advised to call our office if s/he has any problems, questions, worsening symptoms, or concerns prior to the next appointment.   Thanks for allowing me to see this nice patient. If I can be of additional assistance, please don't hesitate to contact me.     [name] AGPCNP-BC, [facility]  Pager [phone]  Collaborating physician: Dr. Vanetta Shawl             No diagnosis found.                      Current Medications (including today's revisions)  ? allopurinoL (ZYLOPRIM) 300 mg tablet Take 300 mg by mouth daily. Take with food. ? amiodarone (CORDARONE) 200 mg tablet Take one tablet by mouth daily.   ? bumetanide (BUMEX) 2 mg tablet Take three tablets by mouth twice daily.   ? cholecalciferol (vitamin D3) (VITAMIN D3 PO) Take 1 tablet by mouth daily. Pt. Unsure of dosage   ? docusate (COLACE) 100 mg capsule Take 100 mg by mouth daily.   ? doxycycline hyclate (VIBRAMYCIN) 100 mg capsule Take one capsule by mouth twice daily. Take for a total of 5 days.   ? empagliflozin (JARDIANCE) 10 mg tablet Take one tablet by mouth daily.   ? esomeprazole DR(+) (NEXIUM) 20 mg capsule Take 20 mg by mouth daily. Take on an empty stomach at least 1 hour before or 2 hours after food.   ? ferrous sulfate (FEOSOL) 325 mg (65 mg iron) tablet Take 325 mg by mouth twice daily. Take  on an empty stomach at least 1 hour before or 2 hours after food.    ? fluticasone/salmeterol (ADVAIR) 100/50 mcg inhalation disk Inhale 1 puff by mouth into the lungs daily.   ? lactobacillus rhamnosus (GG) (CULTURELLE) 10 billion cell cap Take 1 capsule by mouth daily with breakfast.   ? magnesium oxide (MAG-OX) 400 mg (241.3 mg magnesium) tablet Take one tablet by mouth daily.   ? metOLazone (ZAROXOLYN) 2.5 mg tablet Take Metolazone 2.5 mg (1 tablet) once today 07/14 and tomorrow 07/15 until further instructed by Dr. Vanetta Shawl.   ? polyethylene glycol 3350 (MIRALAX) 17 g packet Take one packet by mouth daily. (Patient taking differently: Take 17 g by mouth as Needed.)   ? potassium chloride SR (KLOR-CON M20) 20 mEq tablet Take two tablets by mouth three times daily.   ? rivaroxaban (XARELTO) 15 mg tablet Take one tablet by mouth daily with breakfast. Take with food. Hold the day before and the morning of your CardioMEMS procedure.   ? rosuvastatin (CRESTOR) 5 mg tablet Take one tablet by mouth three times weekly.   ? senna (SENOKOT) 8.6 mg tablet Take 2 tablets by mouth at bedtime as needed for Constipation.   ? sildenafiL (VIAGRA) 100 mg tablet TAKE 1 TABLET BY MOUTH ONCE DAILY AS NEEDED FOR SEXUAL ACTIVITY   ? tafamidis (VYNDAMAX) 61 mg capsule Take one capsule by mouth daily.   ? VENTOLIN HFA 90 mcg/actuation inhaler Inhale 1 puff by mouth into the lungs as Needed.

## 2020-10-16 ENCOUNTER — Encounter: Admit: 2020-10-16 | Discharge: 2020-10-16 | Payer: MEDICARE

## 2020-10-17 ENCOUNTER — Encounter: Admit: 2020-10-17 | Discharge: 2020-10-17 | Payer: MEDICARE

## 2020-10-17 IMAGING — CR UP_EXM
3 series · 3 of 3 positions shown · non-contrast
Comparison: none

[hand]
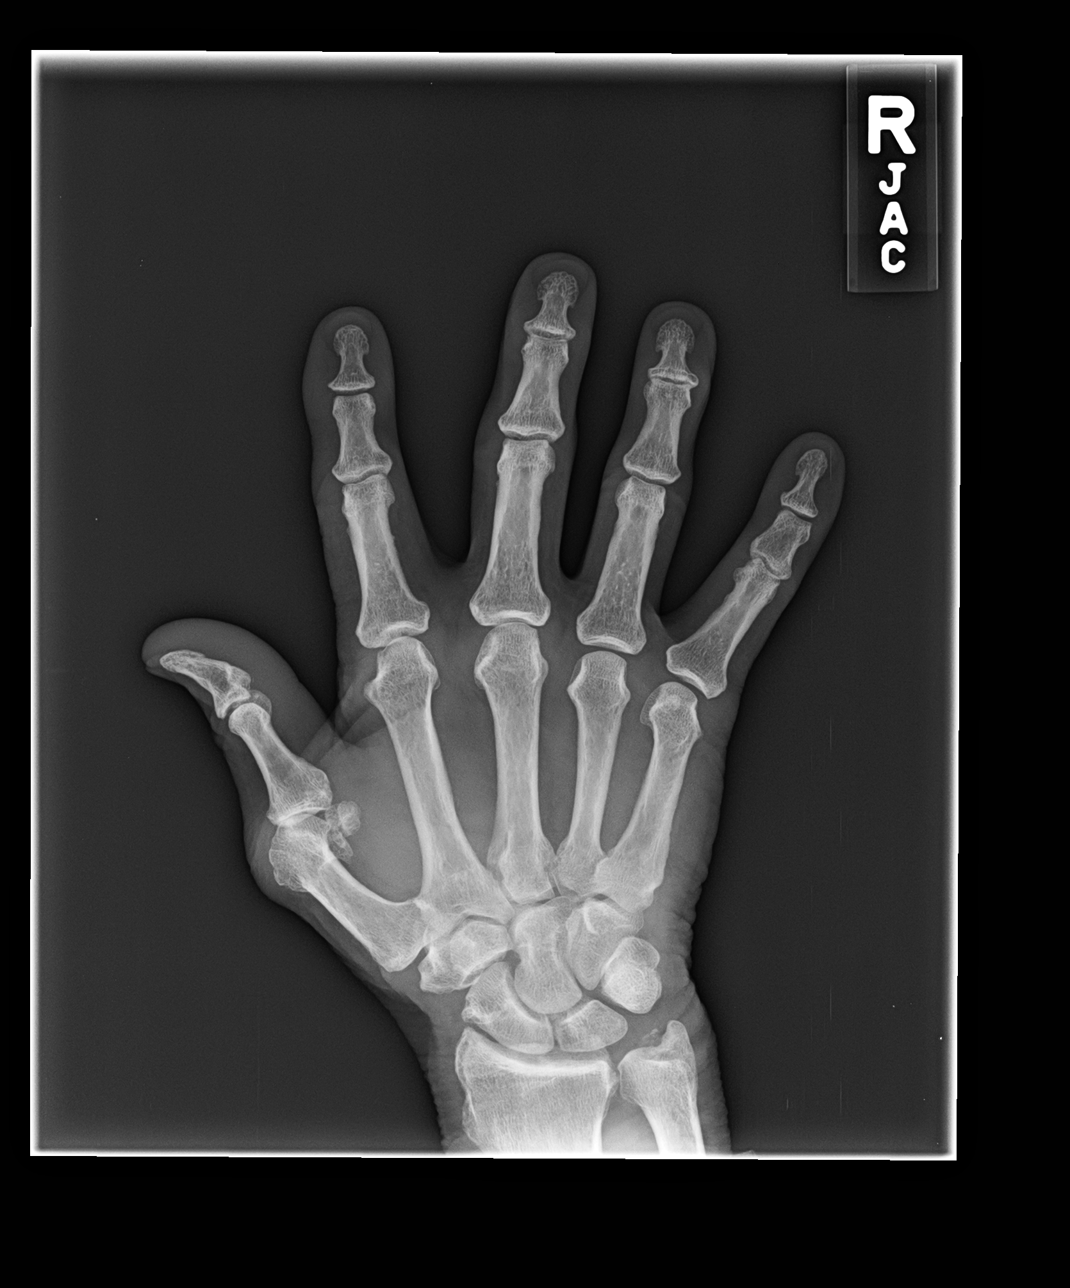

[hand obl]
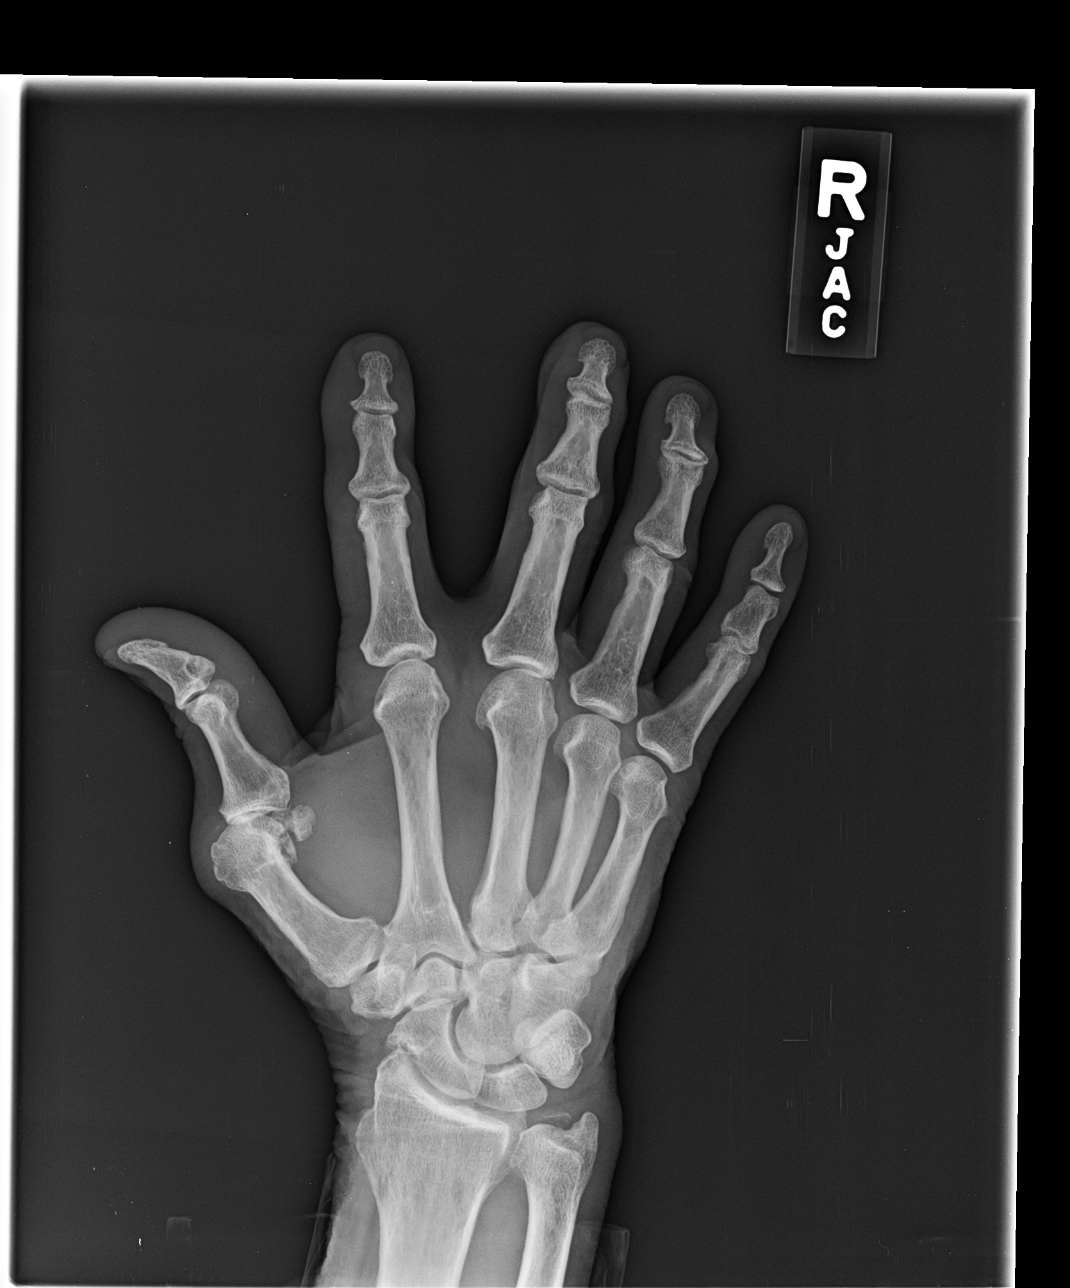

[hand lat]
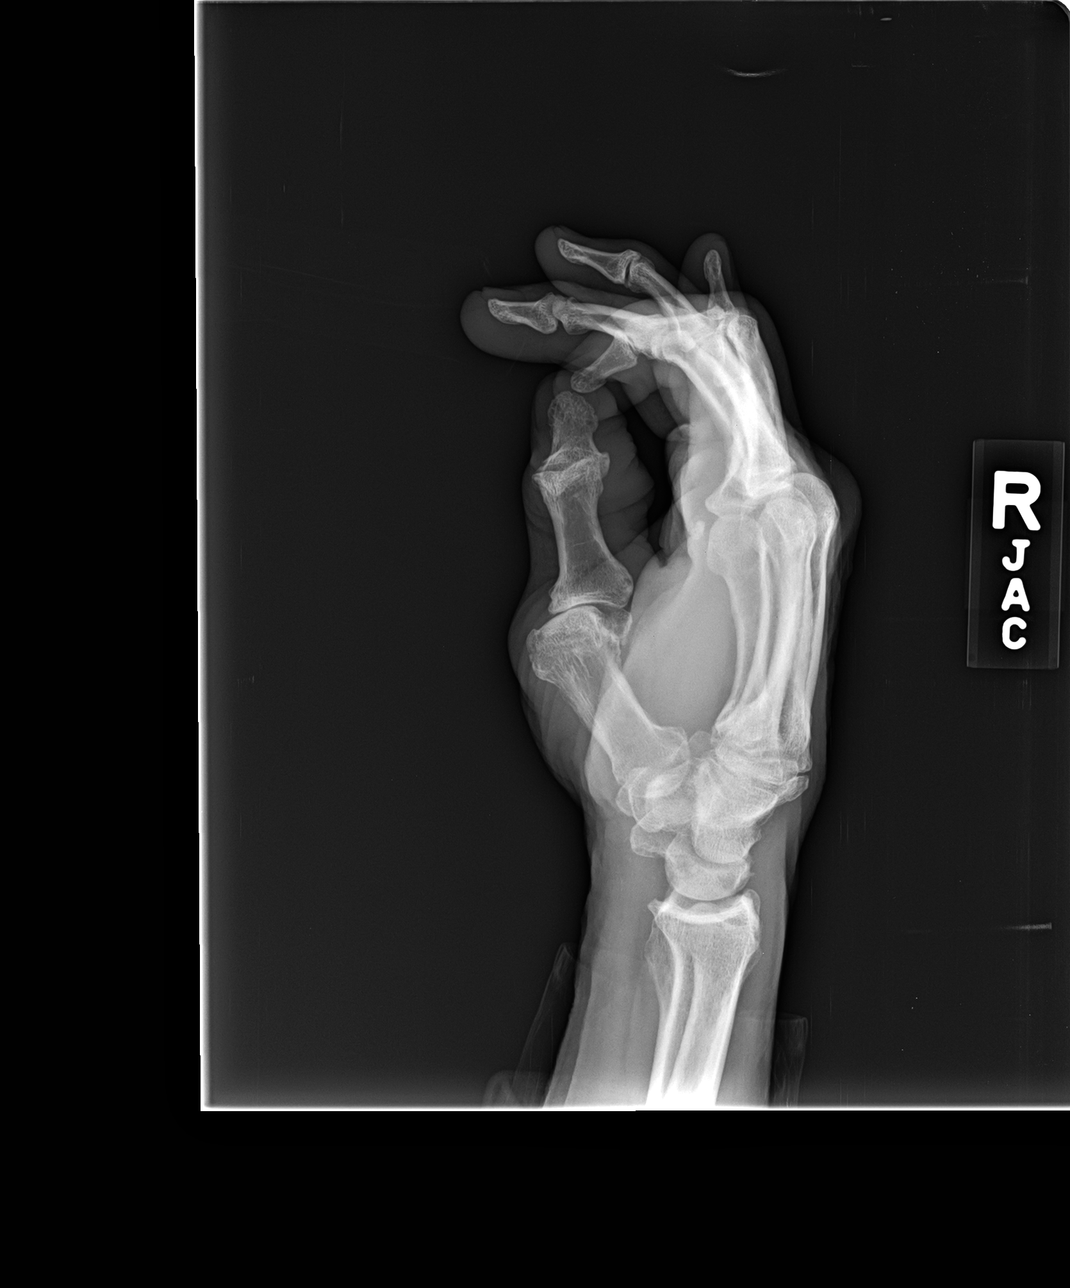

[3 of 3 positions shown; findings below may reference images not displayed]

EXAM

3 VIEW RIGHT HAND

INDICATION

right hand contracture
PT C/O numbness in 6st-2rd digits x 6 months. No known injury. JC/TJ

TECHNIQUE

Frontal, oblique, and lateral radiographs of the right hand were submitted.

COMPARISONS

None

FINDINGS

There is no acute fracture or malalignment. Scattered osteoarthritis throughout the hand and wrist
is largely mild with small osteophytes from level of distal radial ulnar joint through distal
interphalangeal joints, apart from the thumb metacarpophalangeal joint which is more moderate to
severe with joint space loss and osteophytosis. Chondrocalcinosis at the level of the triangular
fibrocartilage complex. No destructive bony lesion is identified.

IMPRESSION

1. No acute displaced fracture or malalignment.

2. Mild scattered osteoarthritis right hand and wrist, apart from more moderate to severe
osteoarthritis thumb metacarpophalangeal joint.

3. TFCC chondrocalcinosis.

Tech Notes:

PT C/O numbness in 6st-2rd digits x 6 months. No known injury. JC/TJ

## 2020-10-17 NOTE — Telephone Encounter
BMP order faxed to [facility] in [place]

## 2020-10-19 ENCOUNTER — Encounter: Admit: 2020-10-19 | Discharge: 2020-10-19 | Payer: MEDICARE

## 2020-10-19 DIAGNOSIS — I5030 Unspecified diastolic (congestive) heart failure: Secondary | ICD-10-CM

## 2020-10-19 LAB — BASIC METABOLIC PANEL
ANION GAP: 18 — ABNORMAL HIGH (ref 3–13)
BLD UREA NITROGEN: 78 — ABNORMAL HIGH (ref 7–25)
CALCIUM: 9.6 (ref 8.6–100.3)
CHLORIDE: 98 (ref 98–110)
CO2: 24 (ref 21–31)
CREATININE: 2.4 — ABNORMAL HIGH (ref 0.4–1.25)
GFR ESTIMATED: 26 — ABNORMAL LOW (ref >60)
GLUCOSE,PANEL: 101 — ABNORMAL HIGH (ref 65–99)
POTASSIUM: 4.4 (ref 3.5–5.1)
SODIUM: 136 (ref 135–145)

## 2020-10-19 NOTE — Telephone Encounter
Ray Running, APRN-NP  [name]  Caller: Unspecified (Today, 11:46 AM)    I hate to increase his metolazone until I'm sure his K is improved. Glad to hear that his swelling is down. DM

## 2020-10-19 NOTE — Telephone Encounter
Call to pt to offer appt with Dr. Fara Olden on 9/15 @ 1pm at [facility] clinic. LVM to return call.

## 2020-10-19 NOTE — Telephone Encounter
PC to pt and wife for symptom check. Pt reports weight down 3lbs this week( wt=147 lbs this AM). No swelling or pedal edema, no increase from his usual shortness of breath.  Pt states my legs look great and I feel Ray [name].  BP 111/62, pulse 84. Pt having BMP drawn today at [facility]. Appt 10/22/20 for TAVR work-up and appt with Norman Clay, APRN.    DAILY CardioMEMS READINGS    Goal PA Diastolic: 22 mmHg  PA Diastolic Thresholds: 20-24 mmHg        CARDIAC MEDS:    Home Medications    Medication Sig   allopurinoL (ZYLOPRIM) 300 mg tablet Take 300 mg by mouth daily. Take with food.   amiodarone (CORDARONE) 200 mg tablet Take one tablet by mouth daily.   bumetanide (BUMEX) 2 mg tablet Take three tablets by mouth twice daily.   cholecalciferol (vitamin D3) (VITAMIN D3 PO) Take 1 tablet by mouth daily. Pt. Unsure of dosage   docusate (COLACE) 100 mg capsule Take 100 mg by mouth daily.   doxycycline hyclate (VIBRAMYCIN) 100 mg capsule Take one capsule by mouth twice daily. Take for a total of 5 days.   empagliflozin (JARDIANCE) 10 mg tablet Take one tablet by mouth daily.   esomeprazole DR(+) (NEXIUM) 20 mg capsule Take 20 mg by mouth daily. Take on an empty stomach at least 1 hour before or 2 hours after food.   ferrous sulfate (FEOSOL) 325 mg (65 mg iron) tablet Take 325 mg by mouth twice daily. Take on an empty stomach at least 1 hour before or 2 hours after food.    fluticasone/salmeterol (ADVAIR) 100/50 mcg inhalation disk Inhale 1 puff by mouth into the lungs daily.   lactobacillus rhamnosus (GG) (CULTURELLE) 10 billion cell cap Take 1 capsule by mouth daily with breakfast.   magnesium oxide (MAG-OX) 400 mg (241.3 mg magnesium) tablet Take one tablet by mouth daily.   metOLazone (ZAROXOLYN) 2.5 mg tablet Take one-half tablet by mouth three times weekly. Take metolazone 1.25 mg on Monday-Wednesday and Friday   polyethylene glycol 3350 (MIRALAX) 17 g packet Take one packet by mouth daily.  Patient taking differently: Take 17 g by mouth as Needed.   potassium chloride SR (KLOR-CON M20) 20 mEq tablet Take three tablets by mouth twice daily AND two tablets at bedtime daily. Take 60 meq every am: take 60 meq every noon: take 40 meq every evening   rivaroxaban (XARELTO) 15 mg tablet Take one tablet by mouth daily with breakfast. Take with food. Hold the day before and the morning of your CardioMEMS procedure.   rosuvastatin (CRESTOR) 5 mg tablet Take one tablet by mouth three times weekly.   senna (SENOKOT) 8.6 mg tablet Take 2 tablets by mouth at bedtime as needed for Constipation.   sildenafiL (VIAGRA) 100 mg tablet TAKE 1 TABLET BY MOUTH ONCE DAILY AS NEEDED FOR SEXUAL ACTIVITY   tafamidis (VYNDAMAX) 61 mg capsule Take one capsule by mouth daily.   VENTOLIN HFA 90 mcg/actuation inhaler Inhale 1 puff by mouth into the lungs as Needed.       RECENT LABS:    Basic Metabolic Profile    Lab Results   Component Value Date/Time    NA 137 10/15/2020 12:00 AM    K 3.1 (L) 10/15/2020 12:00 AM    CA 9.3 10/15/2020 12:00 AM    CL 93 (L) 10/15/2020 12:00 AM    CO2 28 10/15/2020 12:00 AM    GAP 19 (H)  10/15/2020 12:00 AM    Lab Results   Component Value Date/Time    BUN 79 (H) 10/15/2020 12:00 AM    CR 2.39 (H) 10/15/2020 12:00 AM    GLU 126 10/15/2020 12:00 AM          UPCOMING APPOINTMENTS:    Future Appointments   Date Time Provider Department Center   10/22/2020  2:10 PM [facility]OPNURSECTS [id number] C[facility]   10/22/2020  2:10 PM [facility] TECH [facility] CVM Procedur   10/22/2020  3:20 PM PAC ROOM 3 PRE None   10/22/2020  4:30 PM Janace Litten, APRN-NP [facility] CVM Exam   12/04/2020  2:00 PM Vanetta Shawl I, MD [facility] CVM Exam   12/12/2020  3:15 PM Vanetta Shawl I, MD [facility] CVM Exam   12/13/2020  1:00 PM [name], MD [facility] IM

## 2020-10-19 NOTE — Telephone Encounter
Spoke with pt spouse, scheduled for 9/15 @ 1pm. They will contact our office if pt needs to be seen sooner after his procedure next week with Cardiology.

## 2020-10-22 ENCOUNTER — Encounter: Admit: 2020-10-22 | Discharge: 2020-10-22 | Payer: MEDICARE

## 2020-10-22 ENCOUNTER — Ambulatory Visit: Admit: 2020-10-22 | Discharge: 2020-10-22 | Payer: MEDICARE

## 2020-10-22 DIAGNOSIS — I340 Nonrheumatic mitral (valve) insufficiency: Secondary | ICD-10-CM

## 2020-10-22 DIAGNOSIS — E8582 Wild-type transthyretin-related (ATTR) amyloidosis: Secondary | ICD-10-CM

## 2020-10-22 DIAGNOSIS — I358 Other nonrheumatic aortic valve disorders: Secondary | ICD-10-CM

## 2020-10-22 DIAGNOSIS — J302 Other seasonal allergic rhinitis: Secondary | ICD-10-CM

## 2020-10-22 DIAGNOSIS — C4490 Unspecified malignant neoplasm of skin, unspecified: Secondary | ICD-10-CM

## 2020-10-22 DIAGNOSIS — I350 Nonrheumatic aortic (valve) stenosis: Secondary | ICD-10-CM

## 2020-10-22 DIAGNOSIS — I361 Nonrheumatic tricuspid (valve) insufficiency: Secondary | ICD-10-CM

## 2020-10-22 DIAGNOSIS — E859 Amyloidosis, unspecified: Secondary | ICD-10-CM

## 2020-10-22 DIAGNOSIS — N189 Chronic kidney disease, unspecified: Secondary | ICD-10-CM

## 2020-10-22 DIAGNOSIS — I5032 Chronic diastolic (congestive) heart failure: Secondary | ICD-10-CM

## 2020-10-22 DIAGNOSIS — Z8679 Personal history of other diseases of the circulatory system: Secondary | ICD-10-CM

## 2020-10-22 DIAGNOSIS — I4811 Longstanding persistent atrial fibrillation: Secondary | ICD-10-CM

## 2020-10-22 DIAGNOSIS — I10 Essential (primary) hypertension: Secondary | ICD-10-CM

## 2020-10-22 DIAGNOSIS — N184 Chronic kidney disease, stage 4 (severe): Secondary | ICD-10-CM

## 2020-10-22 DIAGNOSIS — J45909 Unspecified asthma, uncomplicated: Secondary | ICD-10-CM

## 2020-10-22 DIAGNOSIS — E785 Hyperlipidemia, unspecified: Secondary | ICD-10-CM

## 2020-10-22 LAB — COMPREHENSIVE METABOLIC PANEL
ALBUMIN: 4.1 g/dL (ref 3.5–5.0)
ALK PHOSPHATASE: 130 U/L — ABNORMAL HIGH (ref 25–110)
ALT: 14 U/L (ref 7–56)
ANION GAP: 13 — ABNORMAL HIGH (ref 3–12)
AST: 21 U/L (ref 7–40)
BLD UREA NITROGEN: 74 mg/dL — ABNORMAL HIGH (ref 7–25)
CALCIUM: 9.9 mg/dL (ref 8.5–10.6)
CO2: 32 MMOL/L — ABNORMAL HIGH (ref 21–30)
CREATININE: 2.4 mg/dL — ABNORMAL HIGH (ref 0.4–1.24)
EGFR: 25 mL/min — ABNORMAL LOW (ref >60)
GLUCOSE,PANEL: 79 mg/dL (ref 70–100)
POTASSIUM: 3.9 MMOL/L — ABNORMAL LOW (ref 3.5–5.1)
SODIUM: 137 MMOL/L — ABNORMAL LOW (ref 137–147)
TOTAL BILIRUBIN: 0.8 mg/dL (ref 0.3–1.2)
TOTAL PROTEIN: 7.6 g/dL (ref 6.0–8.0)

## 2020-10-22 LAB — URINALYSIS MICROSCOPIC REFLEX TO CULTURE

## 2020-10-22 LAB — CBC
RBC COUNT: 3.9 M/UL — ABNORMAL LOW (ref 4.4–5.5)
WBC COUNT: 8 K/UL (ref 4.5–11.0)

## 2020-10-22 LAB — URINALYSIS DIPSTICK REFLEX TO CULTURE
LEUKOCYTES: NEGATIVE
NITRITE: NEGATIVE
PROTEIN,UA: NEGATIVE
URINE ASCORBIC ACID, UA: NEGATIVE
URINE BILE: NEGATIVE
URINE BLOOD: NEGATIVE
URINE KETONE: NEGATIVE
URINE PH: 7 (ref 5.0–8.0)
URINE SPEC GRAVITY: 1 (ref 1.005–1.030)

## 2020-10-22 LAB — BNP (B-TYPE NATRIURETIC PEPTI): BNP: 123 pg/mL — ABNORMAL HIGH (ref 0–100)

## 2020-10-22 LAB — HEMOGLOBIN A1C: HEMOGLOBIN A1C: 6.4 % — ABNORMAL HIGH (ref 4.0–6.0)

## 2020-10-22 NOTE — Patient Education
Pre-op education appt complete with pt and Helene Kelp, wife; who will be w/ pt on DOS also.  Informed of current visitor policy.  Reviewed and provided written pre-op instructions.  Instructed pt to be NPO @ 2300 the night before surgery, except for a sip of water to take any meds that the [facility] pharmacist instructs pt to take on the morning of surgery.  Verified that  LD of Xarelto was 7/23.  Instructed to take CHG 4% shower X 2 prior to coming to hospital as per written instructions; soap provided to pt.  Instructed to check in at [facility] Admissions @ 0815 on DOS.  Discussed process for pre-op, intra-op and post-op recovery.  Discussed post-op pain; lifting and driving restrictions; importance of mobilization and s/s of infection after discharge.  Instructed to call the [place] office if having any health changes prior to surgery.  Pt verbalized understanding of all instructions.  Denies skin or dental issues; denies s/s UTI. KCCQ complete - see flowsheet.  47M walks complete - see additional note.   Pt denies s/s covid. Preop labs drawn in CTS Lab.   All questions answered to the pt's satisfaction.  Escorted to [facility] Admissions for Pre-registration and then to [facility]   AF

## 2020-10-22 NOTE — Progress Notes
Date of Service: 10/22/2020    Ray [name] is a 85 y.o. male.       HPI       I had the pleasure of seeing Ray [name] for an updated H&P prior to TAVR.  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 fib ablation.  He developed progressive aortic stenosis and was referred to valve clinic for TAVR.  He saw Dr. Paris Lore and Dr. Helen Hashimoto on June 14.  He completed his work-up and is felt to be a candidate.  TAVR scheduled on July 27.  He has since continued to follow in the heart failure clinic.  His diuretics have been adjusted based on PA pressure and kidney function.  He has had no new issues since his last visit with [place].  He continues to have some shortness of breath but he remains very active and is an avid gardener.  He has shortness of breath and lower extremity edema that comes and goes but he stops and rest and is able to resume his activity fairly quickly.  He noted a cystlike structure to his right arm close to the radial access site from his heart cath.  Site somewhat tender.  He states it gets bigger and smaller and he does occasionally use ice.  There is no drainage or redness.    Echo 10/11/20:  ? The left ventricular size is normal. Moderate concentric hypertrophy. The left ventricular systolic function is low normal. The ejection fraction by Simpson's biplane method is 50%. There are no segmental wall motion abnormalities.  ? The right ventricle is moderately dilated. The right ventricular systolic function is mildly reduced.  ? Moderate to severe tricuspid and mitral valve regurgitation.  ? The estimated PASP 72 mmHg with elevated central pressures.  ? Paradoxical low flow low gradient likely severe aortic valve stenosis with mean gradient of 20 mmHg, SVI - 18.    Cardiac cath 09/17/20:   Mild nonobstructive coronary artery disease    Carotid duplex 09/17/20:  1. There is mild atheromatous plaque visualized in bilateral common and internal carotid arteries  2. No hemodynamically significant (>50%) stenosis measured in?the common and internal carotid arteries bilaterally  3. There is normal antegrade flow in bilateral vertebral arteries  4. No evidence of proximal subclavian stenosis?bilaterally    CTA 09/28/20:  CHEST:   1. Thickening and calcification of the aortic valve with a normal caliber   ascending thoracic aorta.   2. Development of several sub-5 mm nodules scattered throughout both   lungs. These are nonspecific though are most likely infectious or   inflammatory. In a low-risk patient no follow-up needed. In a high-risk   patient follow-up chest CT at 12 months is suggested. ?   3. Cardiomegaly and coronary artery disease   4. Pulmonary hypertension     ABDOMEN AND PELVIS:   1. Normal caliber abdominal aorta and common iliac arteries with moderate   diffuse atherosclerosis.   2. Moderate to severe focal stenosis at the origin of the SMA.   3. Moderate distal colonic diverticulosis.     PFTs 09/11/20:  Moderate obstructive ventilatory defect with normal lung volumes and preserved gas exchange.  FEV1 1.42 (60%) DLCO 16.99 (80%)    STS risk assessment:   Isolated AVR  Risk of Mortality: 4.533%  Renal Failure: 22.332%   Permanent Stroke: 2.431%   Prolonged Ventilation: 13.062%   DSW Infection: 0.108%   Reoperation: 5.447%   Morbidity or Mortality: 33.023%   Short Length of Stay: 16.568%   Long Length of Stay: 14.936%       Vitals:    10/22/20 1630   BP: (!) 140/62   BP Source: Arm, Left Upper   Pulse: 82   PainSc: Zero   Weight: 68 kg (150 lb)   Height: 162.6 cm (5' 4)     Body mass index is 25.75 kg/m?[name]     Past Medical History  Patient Active Problem List    Diagnosis Date Noted   ? Nonrheumatic mitral valve regurgitation 09/06/2020   ? Nonrheumatic tricuspid valve regurgitation 09/06/2020   ? Nonrheumatic aortic valve stenosis 09/06/2020   ? Asymptomatic microscopic hematuria 12/25/2019   ? S/P left pulmonary artery pressure sensor implant placement 12/15/2019     *Patient has CardioMEMS (Pulmonary Artery Pressure Sensor)* Please call [name], CardioMEMs Program Coordinator [phone] or page Heart Failure Rounding Team if patient is admitted or presents to ED*          ? Chronic heart failure with preserved ejection fraction (HCC) 12/15/2019   ? Acute on chronic heart failure with preserved ejection fraction (HFpEF) (HCC) 11/14/2019   ? 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  ?  01/26/2019 Echo EF:?60%  IVS 1.50 cm (Range: 0.6 - 1.0)         LV PW 1.40 cm (Range: 0.6 - 1.0)         ?  1. Moderate concentric left ventricular hypertrophy  2. Unable to assess diastolic function due to atrial fibrillation  3. Moderate biatrial dilatation  4. Normal right ventricular size and systolic function  5. Normal central venous pressure  6. Mild calcific aortic valve stenosis. ?Mean gradient is 16 mmHg with a peak velocity of 2.8 m/s. ?Trace aortic valve regurgitation is seen  7. Mitral valve is structurally okay. ?There is moderate central regurgitation. ?No stenosis  8. Moderate tricuspid valve regurgitation  9. Pulmonary artery static pressure measures at 33 mmHg   06/28/2019 TC PYP Multiview planar views and SPECT imaging confirms the presence of severe marked diffuse global left ventricular and right ventricular myocardial uptake of tracer. ?There is normal rib and bone uptake.  ?  Two hour heart/contralateral lung (H/CL) ratio: ?1.977?this metric is abnormal and highly suggestive of ATTR cardiac amyloidosis.?  ?  Visual Semi-Quantitative Grading Scale Analysis:?  The study is grade 3 indicating myocardial uptake greater than rib and bone uptake. ?The pattern is highly and strongly suggestive of ATTR cardiac amyloidosis.  ?  SUMMARY/OPINION:   This study is abnormal and strongly suggestive of ATTR cardiac amyloidosis. ?Left ventricular myocardial uptake is diffuse, intense, and global. ?There is also prominent right ventricular free wall uptake of tracer. ?The pattern is typical for ATTR cardiac amyloidosis   ? Cardiac MRI ?   07/25/2019 Kappa/Lambda FLC ?  ? Ref. Range 07/25/2019 15:48   Kappa, FLC Latest Ref Range: 0.33 - 1.94 MG/DL 6.04 (H)   Lambda, FLC Latest Ref Range: 0.57 - 2.63 MG/DL 5.40 (H)   Kappa/Lambda FLC Latest Ref Range: 0.26 - 1.65  1.83 (H)      07/25/2019 Serum Immunofixation ?NO PARAPROTEIN SEEN   cancelled Urine Immunofixation ?Patient unable to provide urine sample 4/26    07/25/2019 Genetic Testing ?Negative   ? Biopsy (Fat  Pad, Cardiac, Bone Marrow) ?   ? GI Symptoms ?   ?+? 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   ?  ? Ref. Range 07/25/2019 15:48   B Type Natriuretic Peptide Latest Ref Range: 0 - 100 PG/ML 966.0 (H)   Troponin-I Latest Ref Range: 0.0 - 0.05 NG/ML 0.05   ?  Completed:  -Tafamidis start 08/2019. PA approved, pt approved for [employer] patient assistance from 09/01/2019 to 03/30/2020  -Baseline KCCQ12 completed 5/18/202, Summary Score 61.46  -Baseline 6 min walk completed 08/16/2019, distance walked 950 feet, 289.75 meters, duration of test 6 minutes  -Baseline CPET completed 10/17/2019       ? S/P ablation of atrial fibrillation 05/18/2018   ? Lightheadedness 03/16/2018   ? Hospitalization within last 30 days 12/24/2017   ? Atrial fibrillation with RVR (HCC) 12/14/2017   ? Chronic kidney disease, stage 4 (severe) (HCC) 12/13/2017   ? Cardiogenic shock (HCC) 12/12/2017   ? Bradycardia 12/12/2017   ? Bilateral leg edema 12/11/2017   ? On amiodarone therapy 12/11/2017   ? Heart failure with preserved ejection fraction (HCC) 01/01/2016   ? NSVT (nonsustained ventricular tachycardia) (HCC) 06/12/2015   ? Medication side effects 11/09/2014   ? Heart palpitations 09/19/2014   ? Longstanding persistent atrial fibrillation (HCC) 03/17/2013     03/26/2018 - ECHO:  Moderate concentric LVH seen. The EF is about 55%.  The atria are slightly dilated.  The RV function is mildly decreased.  Mild to moderate MR and TR seen.  Mild AI is present.  Based on the gradients and appearance, the AS appears to be mild. The mean gradient is 16 mm Hg with a peak velocity of 2.6 m/s.  03/26/2018 - Zio Patch:  Predominant rhythm was normal sinus.  Two runs of ventricular tachycardia occurred, they were monomorphic, the longest run lasted 3 seconds, this episode occurred on April 08, 2018 at 9:41 PM.  One episode of AIVR also appeared to be present, it lasted approximately 4.6 seconds  Brief and rare episodes of SVT that probably represent atrial fibrillation, the longest lasting 6.3 seconds.  No evidence of atrial flutter and no evidence of high degree AV block.  05/12/2018 - LAAA:  Persistent Atrial Fibrillation status post successful pulmonary vein antral isolation.  Cavotricuspid Isthmus Ablation.  Ablation of Fractionated Intracardiac Electrograms.  Ablation for an Additional Discrete Arrhythmia-LA AFL after AFIB/PVI Ablation  06/23/2018 - Cardioversion:  Successful direct current cardioversion from symptomatic atrial fibrillation to sinus rhythm.  03/16/2019 - DCCV:  Successful direct current cardioversion from atrial fibrillation to sinus rhythm.     ? Systolic murmur of aorta 02/04/2013   ? SOB (shortness of breath) 02/04/2013   ? Carotid bruit present 01/23/2012   ? Overweight (BMI 25.0-29.9) 01/30/2011   ? Essential hypertension 01/30/2009   ? Hyperlipidemia 01/30/2009     Hyperlipidemia, on statin therapy     ? Skin cancer 01/30/2009   ? Seasonal allergic reaction 01/30/2009         Review of Systems   Constitutional:        [employer]   HENT: Negative.    Eyes: Negative.    Cardiovascular: Negative.    Respiratory: Positive for shortness of breath.    Endocrine: Negative.    Hematologic/Lymphatic: Negative.    Skin:  Negative.    Musculoskeletal: Negative.    Gastrointestinal: Negative.    Genitourinary: Negative.    Neurological: Negative.    Psychiatric/Behavioral: Negative.    All other systems reviewed and are negative.      Physical Exam  General Appearance: no acute distress  Skin: cyst to right radial area, no redness, drainage or pain  HEENT: unremarkable  Neck Veins: neck veins are flat & not distended  Carotid Arteries: no bruits  Chest Inspection: chest is normal in appearance  Auscultation/Percussion: lungs clear to auscultation, no rales, rhonchi, or wheezing  Cardiac Rhythm: regular rhythm & normal rate  Cardiac Auscultation: Normal S1 & S2, no S3 or S4, no rub  Murmurs: 3/6 systolic murmur   Extremities: no lower extremity edema; 2+ symmetric distal pulses  Abdominal Exam: soft, non-tender, no masses, bowel sounds normal  Liver & Spleen: no organomegaly  Neurologic Exam: oriented to time, place and person; no focal neurologic deficits  Psychiatric: Normal mood and affect.  Behavior is normal. Judgment and thought content normal.         Cardiovascular Studies  Preliminary EKG:    NSR< rate 82 bpm.      Cardiovascular Health Factors  Vitals BP Readings from Last 3 Encounters:   10/22/20 (!) 140/62   10/22/20 (!) 140/63   10/15/20 120/72     Wt Readings from Last 3 Encounters:   10/22/20 68 kg (150 lb)   10/22/20 68 kg (150 lb)   10/15/20 68.4 kg (150 lb 12.8 oz)     BMI Readings from Last 3 Encounters:   10/22/20 25.75 kg/m?   10/22/20 25.75 kg/m?   10/15/20 25.09 kg/m?      Smoking Social History     Tobacco Use   Smoking Status Never Smoker   Smokeless Tobacco Never Used      Lipid Profile Cholesterol   Date Value Ref Range Status   09/17/2020 145 <200 MG/DL Final     HDL   Date Value Ref Range Status   09/17/2020 64 >40 MG/DL Final     LDL   Date Value Ref Range Status   09/17/2020 72 <100 mg/dL Final     Triglycerides   Date Value Ref Range Status   09/17/2020 87 <150 MG/DL Final      Blood Sugar Hemoglobin A1C   Date Value Ref Range Status   10/22/2020 6.4 (H) 4.0 - 6.0 % Final     Comment:     The ADA recommends that most patients with type 1 and type 2 diabetes maintain   an A1c level <7%.       Glucose   Date Value Ref Range Status   10/22/2020 79 70 - 100 MG/DL Final   16/12/9602 540 (H) 65 - 99 Final   10/15/2020 126 70 - 105 Final     Glucose, POC   Date Value Ref Range Status   10/11/2020 148 (H) 70 - 100 MG/DL Final   98/01/9146 829 (H) 70 - 100 MG/DL Final   56/21/3086 578 (H) 70 - 100 MG/DL Final   46/96/2952 841 (H) 70 - 100 MG/DL Final          Problems Addressed Today  Encounter Diagnoses   Name Primary?   ? Nonrheumatic aortic valve stenosis Yes   ? Wild-type transthyretin-related (ATTR) amyloidosis (HCC)    ? Longstanding persistent atrial fibrillation (HCC)    ? Chronic heart failure with preserved ejection fraction (HCC)    ?  Chronic kidney disease, stage 4 (severe) (HCC)    ? Essential hypertension        Assessment and Plan       1.  Low-flow, low gradient severe aortic stenosis.  He underwent work-up for TAVR and is scheduled for the procedure on July 27 by Dr. Helen Hashimoto and Dr. Paris Lore.    2.  Chronic diastolic heart failure.  He is NYHA class II symptoms.  He has no evidence of volume overload on exam today.  He currently takes Bumex 6 mg twice a day and metolazone on an as-needed basis    3.  CKD, stage IV.  His creatinine is stable on lab work today.    4.  Wild-type ATTR amyloidosis.  He is on treatment with tafamidis.    5.  Paroxysmal A. fib.  He is on rivaroxaban for anticoagulation.  His last dose was on Saturday.    We discussed the procedure in detail and all questions were answered.  He has a cyst on his right arm near his radial access site.  It may be beneficial to use the left side for an art line.  Thank you for allowing [place] to participate in the care of this pleasant individual.  If you have any other questions or concerns, please do not hesitate to contact us.    Sherrlyn Hock, APRN  Structural Heart Nurse Practitioner  Pager [phone]           Current Medications (including today's revisions)  ? allopurinoL (ZYLOPRIM) 300 mg tablet Take 300 mg by mouth daily. Take with food.   ? amiodarone (CORDARONE) 200 mg tablet Take one tablet by mouth daily.   ? bumetanide (BUMEX) 2 mg tablet Take three tablets by mouth twice daily.   ? cholecalciferol (vitamin D3) (VITAMIN D3) 1,000 units tablet Take 1,000 Units by mouth daily.   ? docusate (COLACE) 100 mg capsule Take 100 mg by mouth twice daily.   ? empagliflozin (JARDIANCE) 10 mg tablet Take one tablet by mouth daily.   ? esomeprazole DR(+) (NEXIUM) 20 mg capsule Take 20 mg by mouth daily. Take on an empty stomach at least 1 hour before or 2 hours after food.   ? ferrous sulfate (FEOSOL) 325 mg (65 mg iron) tablet Take 325 mg by mouth twice daily. Take on an empty stomach at least 1 hour before or 2 hours after food.    ? fluticasone/salmeterol (ADVAIR) 100/50 mcg inhalation disk Inhale 1 puff by mouth into the lungs daily.   ? lactobacillus rhamnosus (GG) (CULTURELLE) 10 billion cell cap Take 1 capsule by mouth daily with breakfast.   ? magnesium oxide (MAG-OX) 400 mg (241.3 mg magnesium) tablet Take one tablet by mouth daily.   ? metOLazone (ZAROXOLYN) 2.5 mg tablet Take one-half tablet by mouth three times weekly. Take metolazone 1.25 mg on Monday-Wednesday and Friday   ? polyethylene glycol 3350 (MIRALAX) 17 g packet Take one packet by mouth daily. (Patient taking differently: Take 17 g by mouth as Needed.)   ? potassium chloride SR (KLOR-CON M20) 20 mEq tablet Take three tablets by mouth twice daily AND two tablets at bedtime daily. Take 60 meq every am: take 60 meq every noon: take 40 meq every evening   ? rivaroxaban (XARELTO) 15 mg tablet Take one tablet by mouth daily with breakfast. Take with food. Hold the day before and the morning of your CardioMEMS procedure.   ? rosuvastatin (CRESTOR) 5 mg tablet Take one tablet by  mouth three times weekly.   ? senna (SENOKOT) 8.6 mg tablet Take 2 tablets by mouth at bedtime as needed for Constipation.   ? sildenafiL (VIAGRA) 100 mg tablet TAKE 1 TABLET BY MOUTH ONCE DAILY AS NEEDED FOR SEXUAL ACTIVITY   ? tafamidis (VYNDAMAX) 61 mg capsule Take one capsule by mouth daily.   ? VENTOLIN HFA 90 mcg/actuation inhaler Inhale 1 puff by mouth into the lungs as Needed.

## 2020-10-22 NOTE — Progress Notes
5 Meter walks complete:     1st Trial = 7.44 seconds  2nd Trial = 7.48 seconds  3rd Trial = 7.40 seconds

## 2020-10-24 ENCOUNTER — Encounter: Admit: 2020-10-24 | Discharge: 2020-10-24 | Payer: MEDICARE

## 2020-10-24 ENCOUNTER — Inpatient Hospital Stay: Admit: 2020-10-24 | Discharge: 2020-10-24 | Payer: MEDICARE

## 2020-10-24 DIAGNOSIS — I361 Nonrheumatic tricuspid (valve) insufficiency: Secondary | ICD-10-CM

## 2020-10-24 DIAGNOSIS — J45909 Unspecified asthma, uncomplicated: Secondary | ICD-10-CM

## 2020-10-24 DIAGNOSIS — J302 Other seasonal allergic rhinitis: Secondary | ICD-10-CM

## 2020-10-24 DIAGNOSIS — I358 Other nonrheumatic aortic valve disorders: Secondary | ICD-10-CM

## 2020-10-24 DIAGNOSIS — I350 Nonrheumatic aortic (valve) stenosis: Secondary | ICD-10-CM

## 2020-10-24 DIAGNOSIS — E785 Hyperlipidemia, unspecified: Secondary | ICD-10-CM

## 2020-10-24 DIAGNOSIS — C4490 Unspecified malignant neoplasm of skin, unspecified: Secondary | ICD-10-CM

## 2020-10-24 DIAGNOSIS — E859 Amyloidosis, unspecified: Secondary | ICD-10-CM

## 2020-10-24 DIAGNOSIS — N189 Chronic kidney disease, unspecified: Secondary | ICD-10-CM

## 2020-10-24 DIAGNOSIS — Z8679 Personal history of other diseases of the circulatory system: Secondary | ICD-10-CM

## 2020-10-24 DIAGNOSIS — I10 Essential (primary) hypertension: Secondary | ICD-10-CM

## 2020-10-24 DIAGNOSIS — I340 Nonrheumatic mitral (valve) insufficiency: Secondary | ICD-10-CM

## 2020-10-24 MED ORDER — PROTAMINE 10 MG/ML IV SOLN
INTRAVENOUS | 0 refills | Status: DC
Start: 2020-10-24 — End: 2020-10-24
  Administered 2020-10-24: 16:00:00 40 mg via INTRAVENOUS

## 2020-10-24 MED ORDER — ROCURONIUM 10 MG/ML IV SOLN
INTRAVENOUS | 0 refills | Status: DC
Start: 2020-10-24 — End: 2020-10-24
  Administered 2020-10-24: 16:00:00 10 mg via INTRAVENOUS
  Administered 2020-10-24: 15:00:00 40 mg via INTRAVENOUS

## 2020-10-24 MED ORDER — CEFAZOLIN 1 GRAM IJ SOLR
INTRAVENOUS | 0 refills | Status: DC
Start: 2020-10-24 — End: 2020-10-24
  Administered 2020-10-24: 16:00:00 2 g via INTRAVENOUS

## 2020-10-24 MED ORDER — ONDANSETRON HCL (PF) 4 MG/2 ML IJ SOLN
INTRAVENOUS | 0 refills | Status: DC
Start: 2020-10-24 — End: 2020-10-24
  Administered 2020-10-24: 16:00:00 4 mg via INTRAVENOUS

## 2020-10-24 MED ORDER — SUGAMMADEX 100 MG/ML IV SOLN
INTRAVENOUS | 0 refills | Status: DC
Start: 2020-10-24 — End: 2020-10-24
  Administered 2020-10-24: 17:00:00 267 mg via INTRAVENOUS

## 2020-10-24 MED ORDER — PROPOFOL INJ 10 MG/ML IV VIAL
INTRAVENOUS | 0 refills | Status: DC
Start: 2020-10-24 — End: 2020-10-24
  Administered 2020-10-24: 15:00:00 60 mg via INTRAVENOUS

## 2020-10-24 MED ORDER — HEPARIN (PORCINE) 1,000 UNIT/ML IJ SOLN
INTRAVENOUS | 0 refills | Status: DC
Start: 2020-10-24 — End: 2020-10-24
  Administered 2020-10-24: 16:00:00 7000 [IU] via INTRAVENOUS

## 2020-10-24 MED ORDER — SODIUM CHLORIDE 0.9 % IV SOLP
INTRAVENOUS | 0 refills | Status: DC
Start: 2020-10-24 — End: 2020-10-24
  Administered 2020-10-24: 16:00:00 via INTRAVENOUS

## 2020-10-24 MED ORDER — DEXAMETHASONE SODIUM PHOSPHATE 4 MG/ML IJ SOLN
INTRAVENOUS | 0 refills | Status: DC
Start: 2020-10-24 — End: 2020-10-24
  Administered 2020-10-24: 15:00:00 4 mg via INTRAVENOUS

## 2020-10-24 MED ORDER — LIDOCAINE (PF) 200 MG/10 ML (2 %) IJ SYRG
INTRAVENOUS | 0 refills | Status: DC
Start: 2020-10-24 — End: 2020-10-24
  Administered 2020-10-24: 15:00:00 60 mg via INTRAVENOUS

## 2020-10-24 MED ORDER — PHENYLEPHRINE 40 MCG/ML IN NS IV DRIP (STD CONC)
INTRAVENOUS | 0 refills | Status: DC
Start: 2020-10-24 — End: 2020-10-24
  Administered 2020-10-24 (×2): .3 ug/kg/min via INTRAVENOUS

## 2020-10-24 MED ORDER — ARTIFICIAL TEARS (PF) SINGLE DOSE DROPS GROUP
OPHTHALMIC | 0 refills | Status: DC
Start: 2020-10-24 — End: 2020-10-24
  Administered 2020-10-24: 15:00:00 2 [drp] via OPHTHALMIC

## 2020-10-24 MED ORDER — ELECTROLYTE-A IV SOLP
INTRAVENOUS | 0 refills | Status: DC
Start: 2020-10-24 — End: 2020-10-24
  Administered 2020-10-24: 15:00:00 via INTRAVENOUS

## 2020-10-24 MED ORDER — NOREPINEPHRINE IV DRIP STD CONC (AM)(OR)
INTRAVENOUS | 0 refills | Status: DC
Start: 2020-10-24 — End: 2020-10-24
  Administered 2020-10-24: 16:00:00 .03 ug/kg/min via INTRAVENOUS

## 2020-10-24 MED ADMIN — POTASSIUM CHLORIDE IN WATER 10 MEQ/50 ML IV PGBK [11075]: 10 meq | INTRAVENOUS | @ 21:00:00 | Stop: 2021-10-24 | NDC 00338070541

## 2020-10-24 MED ADMIN — SODIUM CHLORIDE 0.9 % IV SOLP [27838]: INTRAVENOUS | @ 17:00:00 | Stop: 2020-10-24 | NDC 00338004904

## 2020-10-24 MED ADMIN — POTASSIUM CHLORIDE IN WATER 10 MEQ/50 ML IV PGBK [11075]: 10 meq | INTRAVENOUS | @ 20:00:00 | Stop: 2021-10-24 | NDC 00338070541

## 2020-10-24 MED ADMIN — ASPIRIN 81 MG PO CHEW [680]: 81 mg | ORAL | @ 14:00:00 | Stop: 2020-10-25 | NDC 66553000201

## 2020-10-24 MED ADMIN — LACTATED RINGERS IV SOLP [4318]: INTRAVENOUS | @ 13:00:00 | Stop: 2020-10-24 | NDC 00338011704

## 2020-10-25 ENCOUNTER — Encounter: Admit: 2020-10-25 | Discharge: 2020-10-25 | Payer: MEDICARE

## 2020-10-25 ENCOUNTER — Inpatient Hospital Stay: Admit: 2020-10-25 | Discharge: 2020-10-25 | Payer: MEDICARE

## 2020-10-25 MED ADMIN — SENNOSIDES-DOCUSATE SODIUM 8.6-50 MG PO TAB [40926]: 2 | ORAL | @ 01:00:00 | NDC 70000052601

## 2020-10-25 MED ADMIN — PANTOPRAZOLE 20 MG PO TBEC [79463]: 20 mg | ORAL | @ 01:00:00 | NDC 60687058511

## 2020-10-25 MED ADMIN — BUMETANIDE 1 MG PO TAB [9310]: 6 mg | ORAL | @ 14:00:00 | Stop: 2020-10-25 | NDC 00904701604

## 2020-10-25 MED ADMIN — POTASSIUM CHLORIDE 20 MEQ PO TBTQ [35943]: 20 meq | ORAL | @ 04:00:00 | Stop: 2021-10-24 | NDC 00245531989

## 2020-10-25 MED ADMIN — CEFAZOLIN INJ 1GM IVP [210319]: 1 g | INTRAVENOUS | @ 01:00:00 | Stop: 2020-10-26 | NDC 60505614200

## 2020-10-25 MED ADMIN — BUDESONIDE-FORMOTEROL 80-4.5 MCG/ACTUATION IN HFAA [163005]: 2 | RESPIRATORY_TRACT | @ 15:00:00 | Stop: 2020-10-25 | NDC 00186037228

## 2020-10-25 MED ADMIN — FERROUS SULFATE 325 MG (65 MG IRON) PO TAB [3074]: 325 mg | ORAL | @ 14:00:00 | Stop: 2020-10-25 | NDC 00904759161

## 2020-10-25 MED ADMIN — SENNOSIDES-DOCUSATE SODIUM 8.6-50 MG PO TAB [40926]: 2 | ORAL | @ 14:00:00 | Stop: 2020-10-25 | NDC 70000052601

## 2020-10-25 MED ADMIN — RIVAROXABAN 15 MG PO TAB [309660]: 15 mg | ORAL | @ 14:00:00 | Stop: 2020-10-25 | NDC 50458057801

## 2020-10-25 MED ADMIN — CEFAZOLIN INJ 1GM IVP [210319]: 1 g | INTRAVENOUS | @ 09:00:00 | Stop: 2020-10-25 | NDC 60505614200

## 2020-10-25 MED ADMIN — BUMETANIDE 1 MG PO TAB [9310]: 6 mg | ORAL | @ 01:00:00 | NDC 00904701604

## 2020-10-25 MED ADMIN — DAPAGLIFLOZIN 10 MG PO TAB [320172]: 10 mg | ORAL | @ 14:00:00 | Stop: 2020-10-25 | NDC 00310621030

## 2020-10-25 MED ADMIN — FERROUS SULFATE 325 MG (65 MG IRON) PO TAB [3074]: 325 mg | ORAL | @ 01:00:00 | NDC 00904759161

## 2020-10-25 MED ADMIN — CEFAZOLIN INJ 1GM IVP [210319]: 1 g | INTRAVENOUS | @ 17:00:00 | Stop: 2020-10-25 | NDC 60505614200

## 2020-10-25 MED ADMIN — AMIODARONE 200 MG PO TAB [9066]: 200 mg | ORAL | @ 14:00:00 | Stop: 2020-10-25 | NDC 00904699361

## 2020-10-25 MED ADMIN — ALLOPURINOL 300 MG PO TAB [311]: 300 mg | ORAL | @ 14:00:00 | Stop: 2020-10-25 | NDC 00904657261

## 2020-10-25 MED ADMIN — ASPIRIN 81 MG PO CHEW [680]: 81 mg | ORAL | @ 14:00:00 | Stop: 2020-10-25 | NDC 66553000201

## 2020-10-29 ENCOUNTER — Encounter: Admit: 2020-10-29 | Discharge: 2020-10-29 | Payer: MEDICARE

## 2020-10-29 DIAGNOSIS — I10 Essential (primary) hypertension: Secondary | ICD-10-CM

## 2020-10-29 DIAGNOSIS — Z952 Presence of prosthetic heart valve: Secondary | ICD-10-CM

## 2020-10-29 NOTE — Telephone Encounter
DAILY CardioMEMS READINGS    Goal PA Diastolic: 22 mmHg  PA Diastolic Thresholds: 20-24 mmHg          CARDIAC MEDS:    Home Medications    Medication Sig   amiodarone (CORDARONE) 200 mg tablet Take one tablet by mouth daily.   aspirin 81 mg chewable tablet Chew one tablet by mouth daily. Take with food.   bumetanide (BUMEX) 2 mg tablet Take three tablets by mouth twice daily.   empagliflozin (JARDIANCE) 10 mg tablet Take one tablet by mouth daily.   metOLazone (ZAROXOLYN) 2.5 mg tablet Take one-half tablet by mouth three times weekly. Take metolazone 1.25 mg on Monday-Wednesday and Friday   potassium chloride SR (KLOR-CON M20) 20 mEq tablet Take three tablets by mouth twice daily AND two tablets at bedtime daily. Take 60 meq every am: take 60 meq every noon: take 40 meq every evening   rivaroxaban (XARELTO) 15 mg tablet Take one tablet by mouth daily with breakfast. Take with food.    rosuvastatin (CRESTOR) 5 mg tablet Take one tablet by mouth three times weekly.   sildenafiL (VIAGRA) 100 mg tablet TAKE 1 TABLET BY MOUTH ONCE DAILY AS NEEDED FOR SEXUAL ACTIVITY   tafamidis (VYNDAMAX) 61 mg capsule Take one capsule by mouth daily.       RECENT LABS:    Basic Metabolic Profile    Lab Results   Component Value Date/Time    NA 139 10/25/2020 04:36 AM    K 4.4 10/25/2020 04:36 AM    CA 9.0 10/25/2020 04:36 AM    CL 97 (L) 10/25/2020 04:36 AM    CO2 29 10/25/2020 04:36 AM    GAP 13 (H) 10/25/2020 04:36 AM    Lab Results   Component Value Date/Time    BUN 61 (H) 10/25/2020 04:36 AM    CR 1.97 (H) 10/25/2020 04:36 AM    GLU 116 (H) 10/25/2020 04:36 AM          UPCOMING APPOINTMENTS:    Future Appointments   Date Time Provider Department Center   10/31/2020  9:30 AM [place], Harriet Masson, APRN-NP [facility] Exam   12/04/2020  2:00 PM Vanetta Shawl I, MD [facility] Exam   12/07/2020  2:45 PM [place] [facility]EARCH [facility] Procedur   12/07/2020  4:00 PM McClymont, Harriet Masson, APRN-NP [facility] Exam   12/12/2020  3:15 PM Vanetta Shawl I, MD [facility] Exam   12/13/2020  1:00 PM [name], MD [facility]

## 2020-10-30 ENCOUNTER — Encounter: Admit: 2020-10-30 | Discharge: 2020-10-30 | Payer: MEDICARE

## 2020-10-30 NOTE — Telephone Encounter
Certified letter (article #: [id number]) placed in outgoing mail to patient addressed to patients current address on file notifying the patient we have been unable to reach them to schedule treatment for their skin cancer notifying patient of potential risks of not treating/delaying treatment and requesting a call back to schedule appropriate treatment as soon as possible.       Francisca December, RN

## 2020-10-31 ENCOUNTER — Encounter: Admit: 2020-10-31 | Discharge: 2020-10-31 | Payer: MEDICARE

## 2020-10-31 ENCOUNTER — Ambulatory Visit: Admit: 2020-10-31 | Discharge: 2020-10-31 | Payer: MEDICARE

## 2020-10-31 DIAGNOSIS — E785 Hyperlipidemia, unspecified: Secondary | ICD-10-CM

## 2020-10-31 DIAGNOSIS — I361 Nonrheumatic tricuspid (valve) insufficiency: Secondary | ICD-10-CM

## 2020-10-31 DIAGNOSIS — I5032 Chronic diastolic (congestive) heart failure: Secondary | ICD-10-CM

## 2020-10-31 DIAGNOSIS — E859 Amyloidosis, unspecified: Secondary | ICD-10-CM

## 2020-10-31 DIAGNOSIS — I350 Nonrheumatic aortic (valve) stenosis: Secondary | ICD-10-CM

## 2020-10-31 DIAGNOSIS — N189 Chronic kidney disease, unspecified: Secondary | ICD-10-CM

## 2020-10-31 DIAGNOSIS — Z952 Presence of prosthetic heart valve: Secondary | ICD-10-CM

## 2020-10-31 DIAGNOSIS — J302 Other seasonal allergic rhinitis: Secondary | ICD-10-CM

## 2020-10-31 DIAGNOSIS — Z8679 Personal history of other diseases of the circulatory system: Secondary | ICD-10-CM

## 2020-10-31 DIAGNOSIS — C4490 Unspecified malignant neoplasm of skin, unspecified: Secondary | ICD-10-CM

## 2020-10-31 DIAGNOSIS — J45909 Unspecified asthma, uncomplicated: Secondary | ICD-10-CM

## 2020-10-31 DIAGNOSIS — I358 Other nonrheumatic aortic valve disorders: Secondary | ICD-10-CM

## 2020-10-31 DIAGNOSIS — I4891 Unspecified atrial fibrillation: Secondary | ICD-10-CM

## 2020-10-31 DIAGNOSIS — I340 Nonrheumatic mitral (valve) insufficiency: Secondary | ICD-10-CM

## 2020-10-31 DIAGNOSIS — I10 Essential (primary) hypertension: Secondary | ICD-10-CM

## 2020-10-31 NOTE — Progress Notes
Date of Service: 10/31/2020    Ray [name] is a 85 y.o. male.       HPI     I had the pleasure of seeing Ray [name] for hospital follow up after recent TAVR.  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 fib ablation.  He developed progressive aortic stenosis and was referred to valve clinic for TAVR.  Cardiac cath prior to TAVR revealed mild nonobstructive coronary artery disease.    He was admitted on 10/24/2020 and underwent successful TAVR with a 26 mm S3 bioprosthetic valve with Dr. Helen Hashimoto and Dr. Paris Lore.  He tolerated the procedure well without complications.  He did have intermittent runs of atrial fibrillation and PTA Xarelto was resumed on postop day #1.  A postoperative echocardiogram demonstrated a 26 mm S3 bioprosthetic valve without stenosis or regurgitation.  He was discharged to home on postoperative day #1.    Today he reports dramatic improvement and feels he is less short of breath with exertion.  He has been walking 30 minutes daily and feels that he has increasing energy as well as more stamina.  He denies progressive shortness of breath, orthopnea or PND.  He has noted some mild lower extremity edema and feels that his weight is slowly creeping up and he was 152 pounds this morning.  He reports a dry rate of 145-147 pounds.  He denies chest pain, palpitations, dizziness, lightheadedness, or near syncope.  He denies groin access site complications.           Vitals:    10/31/20 0928   BP: 128/60   BP Source: Arm, Right Upper   Pulse: 57   PainSc: Zero   Weight: 70.8 kg (156 lb)   Height: 162.6 cm (5' 4)     Body mass index is 26.78 kg/m?[name]     Past Medical History  Patient Active Problem List    Diagnosis Date Noted   ? S/P TAVR (transcatheter aortic valve replacement) 10/25/2020   ? Hypokalemia 10/25/2020   ? Nonrheumatic mitral valve regurgitation 09/06/2020   ? Nonrheumatic tricuspid valve regurgitation 09/06/2020   ? Nonrheumatic aortic valve stenosis 09/06/2020   ? Asymptomatic microscopic hematuria 12/25/2019   ? S/P left pulmonary artery pressure sensor implant placement 12/15/2019     *Patient has CardioMEMS (Pulmonary Artery Pressure Sensor)* Please call [name], CardioMEMs Program Coordinator [phone] or page Heart Failure Rounding Team if patient is admitted or presents to ED*          ? Chronic heart failure with preserved ejection fraction (HCC) 12/15/2019   ? Acute on chronic diastolic CHF (congestive heart failure), NYHA class 2 (HCC) 11/14/2019   ? 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  ?  01/26/2019 Echo EF:?60%  IVS 1.50 cm (Range: 0.6 - 1.0)         LV PW 1.40 cm (Range: 0.6 - 1.0)         ?  1. Moderate concentric left ventricular hypertrophy  2. Unable to assess diastolic function due to atrial fibrillation  3. Moderate biatrial dilatation  4. Normal right ventricular size and systolic function  5. Normal central venous pressure  6. Mild calcific aortic valve stenosis. ?Mean gradient is 16 mmHg with a peak velocity of  2.8 m/s. ?Trace aortic valve regurgitation is seen  7. Mitral valve is structurally okay. ?There is moderate central regurgitation. ?No stenosis  8. Moderate tricuspid valve regurgitation  9. Pulmonary artery static pressure measures at 33 mmHg   06/28/2019 TC PYP Multiview planar views and SPECT imaging confirms the presence of severe marked diffuse global left ventricular and right ventricular myocardial uptake of tracer. ?There is normal rib and bone uptake.  ?  Two hour heart/contralateral lung (H/CL) ratio: ?1.977?this metric is abnormal and highly suggestive of ATTR cardiac amyloidosis.?  ?  Visual Semi-Quantitative Grading Scale Analysis:?  The study is grade 3 indicating myocardial uptake greater than rib and bone uptake. ?The pattern is highly and strongly suggestive of ATTR cardiac amyloidosis.  ?  SUMMARY/OPINION:   This study is abnormal and strongly suggestive of ATTR cardiac amyloidosis. ?Left ventricular myocardial uptake is diffuse, intense, and global. ?There is also prominent right ventricular free wall uptake of tracer. ?The pattern is typical for ATTR cardiac amyloidosis   ? Cardiac MRI ?   07/25/2019 Kappa/Lambda FLC ?  ? Ref. Range 07/25/2019 15:48   Kappa, FLC Latest Ref Range: 0.33 - 1.94 MG/DL 0.86 (H)   Lambda, FLC Latest Ref Range: 0.57 - 2.63 MG/DL 5.78 (H)   Kappa/Lambda FLC Latest Ref Range: 0.26 - 1.65  1.83 (H)      07/25/2019 Serum Immunofixation ?NO PARAPROTEIN SEEN   cancelled Urine Immunofixation ?Patient unable to provide urine sample 4/26    07/25/2019 Genetic Testing ?Negative   ? Biopsy (Fat Pad, Cardiac, Bone Marrow) ?   ? GI Symptoms ?   ?+? 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   ?  ? Ref. Range 07/25/2019 15:48   B Type Natriuretic Peptide Latest Ref Range: 0 - 100 PG/ML 966.0 (H)   Troponin-I Latest Ref Range: 0.0 - 0.05 NG/ML 0.05   ?  Completed:  -Tafamidis start 08/2019. PA approved, pt approved for [facility] patient assistance from 09/01/2019 to 03/30/2020  -Baseline KCCQ12 completed 5/18/202, Summary Score 61.46  -Baseline 6 min walk completed 08/16/2019, distance walked 950 feet, 289.75 meters, duration of test 6 minutes  -Baseline CPET completed 10/17/2019       ? S/P ablation of atrial fibrillation 05/18/2018   ? Lightheadedness 03/16/2018   ? Hospitalization within last 30 days 12/24/2017   ? Atrial fibrillation with RVR (HCC) 12/14/2017   ? Chronic kidney disease, stage 4 (severe) (HCC) 12/13/2017 ? Bradycardia 12/12/2017   ? Bilateral leg edema 12/11/2017   ? On amiodarone therapy 12/11/2017   ? Heart failure with preserved ejection fraction (HCC) 01/01/2016   ? NSVT (nonsustained ventricular tachycardia) (HCC) 06/12/2015   ? Medication side effects 11/09/2014   ? Heart palpitations 09/19/2014   ? Longstanding persistent atrial fibrillation (HCC) 03/17/2013     03/26/2018 - ECHO:  Moderate concentric LVH seen. The EF is about 55%.  The atria are slightly dilated.  The RV function is mildly decreased.  Mild to moderate MR and TR seen.  Mild AI is present.  Based on the gradients and appearance, the AS appears to be mild. The mean gradient is 16 mm Hg with a peak velocity of 2.6 m/s.  03/26/2018 - Zio Patch:  Predominant rhythm was normal sinus.  Two runs of ventricular tachycardia occurred, they were monomorphic,  the longest run lasted 3 seconds, this episode occurred on April 08, 2018 at 9:41 PM.  One episode of AIVR also appeared to be present, it lasted approximately 4.6 seconds  Brief and rare episodes of SVT that probably represent atrial fibrillation, the longest lasting 6.3 seconds.  No evidence of atrial flutter and no evidence of high degree AV block.  05/12/2018 - LAAA:  Persistent Atrial Fibrillation status post successful pulmonary vein antral isolation.  Cavotricuspid Isthmus Ablation.  Ablation of Fractionated Intracardiac Electrograms.  Ablation for an Additional Discrete Arrhythmia-LA AFL after AFIB/PVI Ablation  06/23/2018 - Cardioversion:  Successful direct current cardioversion from symptomatic atrial fibrillation to sinus rhythm.  03/16/2019 - DCCV:  Successful direct current cardioversion from atrial fibrillation to sinus rhythm.     ? Systolic murmur of aorta 02/04/2013   ? SOB (shortness of breath) 02/04/2013   ? Carotid bruit present 01/23/2012   ? Overweight (BMI 25.0-29.9) 01/30/2011   ? Essential hypertension 01/30/2009   ? Hyperlipidemia 01/30/2009     Hyperlipidemia, on statin therapy     ? Skin cancer 01/30/2009   ? Seasonal allergic reaction 01/30/2009         Review of Systems   Constitutional: Negative.   HENT: Negative.    Eyes: Negative.    Cardiovascular: Negative.    Respiratory: Negative.    Endocrine: Negative.    Hematologic/Lymphatic: Negative.    Skin: Negative.    Musculoskeletal: Negative.    Gastrointestinal: Negative.    Genitourinary: Negative.    Neurological: Negative.    Psychiatric/Behavioral: Negative.    All other systems reviewed and are negative.      Physical Exam  General Appearance: no acute distress  Skin: warm & intact  HEENT: unremarkable  Neck Veins: neck veins are flat & not distended  Carotid Arteries: no bruits  Chest Inspection: chest is normal in appearance  Auscultation/Percussion: lungs clear to auscultation, no rales, rhonchi, or wheezing  Cardiac Rhythm: regular rhythm & normal rate  Cardiac Auscultation: Normal S1 & S2, no S3 or S4, no rub  Murmurs: Soft holosystolic murmur  Extremities: ZOXWR-6+ lower extremity edema  Abdominal Exam: soft, non-tender, no masses, bowel sounds normal  Liver & Spleen: no organomegaly  Neurologic Exam: oriented to time, place and person; no focal neurologic deficits  Psychiatric: Normal mood and affect.  Behavior is normal. Judgment and thought content normal.         Cardiovascular Studies  Sinus bradycardia with PVCs, heart rate 57    Cardiovascular Health Factors  Vitals BP Readings from Last 3 Encounters:   10/31/20 128/60   10/25/20 117/65   10/22/20 (!) 140/62     Wt Readings from Last 3 Encounters:   10/31/20 70.8 kg (156 lb)   10/25/20 64 kg (141 lb)   10/22/20 68 kg (150 lb)     BMI Readings from Last 3 Encounters:   10/31/20 26.78 kg/m?   10/25/20 24.20 kg/m?   10/22/20 25.75 kg/m?      Smoking Social History     Tobacco Use   Smoking Status Never Smoker   Smokeless Tobacco Never Used      Lipid Profile Cholesterol   Date Value Ref Range Status   09/17/2020 145 <200 MG/DL Final     HDL   Date Value Ref Range Status   09/17/2020 64 >40 MG/DL Final     LDL   Date Value Ref Range Status   09/17/2020 72 <100 mg/dL Final  Triglycerides   Date Value Ref Range Status   09/17/2020 87 <150 MG/DL Final      Blood Sugar Hemoglobin A1C   Date Value Ref Range Status   10/22/2020 6.4 (H) 4.0 - 6.0 % Final     Comment:     The ADA recommends that most patients with type 1 and type 2 diabetes maintain   an A1c level <7%.       Glucose   Date Value Ref Range Status   10/25/2020 116 (H) 70 - 100 MG/DL Final   16/12/9602 540 (H) 70 - 100 MG/DL Final   98/01/9146 829 (H) 70 - 100 MG/DL Final     Glucose, POC   Date Value Ref Range Status   10/11/2020 148 (H) 70 - 100 MG/DL Final   56/21/3086 578 (H) 70 - 100 MG/DL Final   46/96/2952 841 (H) 70 - 100 MG/DL Final   32/44/0102 725 (H) 70 - 100 MG/DL Final          Problems Addressed Today  Encounter Diagnoses   Name Primary?   ? Nonrheumatic aortic valve stenosis Yes   ? S/P TAVR (transcatheter aortic valve replacement)    ? Chronic heart failure with preserved ejection fraction (HCC)    ? Hyperlipidemia, unspecified hyperlipidemia type    ? Essential hypertension    ? Atrial fibrillation with RVR (HCC)        Assessment and Plan     Low-flow low gradient severe aortic stenosis  He underwent successful TAVR with a 26 mm SAPIEN S3 bioprosthetic valve.  He notes symptomatic improvement.  I have asked him to gradually increase his activity as tolerated.  He plans to participate in cardiac rehab in [place].  He otherwise will return on 9/6 for echo Doppler and office visit as part of the TVT registry.    Chronic diastolic heart failure  He appears well compensated today on exam.  He describes NYHA class II heart failure symptoms.  He reports his weight has slowly been increasing and is up approximately 4 pounds.  I have asked him to reach out to the heart failure team regarding adjustment of his diuretics.  He does have a CardioMEMS device in place.    CKD stage IV    Paroxysmal atrial fibrillation  He is on chronic anticoagulation with Xarelto.    Wild-type ATTR amyloidosis  He is on treatment with tafamidis.    I appreciate the opportunity to participate in his care.    Dorena Cookey, APRN  Pager [phone]         Current Medications (including today's revisions)  ? acetaminophen (TYLENOL) 325 mg tablet Take two tablets by mouth every 6 hours as needed.   ? allopurinoL (ZYLOPRIM) 300 mg tablet Take 300 mg by mouth daily. Take with food.   ? amiodarone (CORDARONE) 200 mg tablet Take one tablet by mouth daily.   ? aspirin 81 mg chewable tablet Chew one tablet by mouth daily. Take with food.   ? bumetanide (BUMEX) 2 mg tablet Take three tablets by mouth twice daily.   ? cholecalciferol (vitamin D3) (VITAMIN D3) 1,000 units tablet Take 1,000 Units by mouth daily.   ? docusate (COLACE) 100 mg capsule Take 100 mg by mouth twice daily.   ? empagliflozin (JARDIANCE) 10 mg tablet Take one tablet by mouth daily.   ? esomeprazole DR(+) (NEXIUM) 20 mg capsule Take 20 mg by mouth daily. Take on an empty stomach at least 1 hour  before or 2 hours after food.   ? ferrous sulfate (FEOSOL) 325 mg (65 mg iron) tablet Take 325 mg by mouth twice daily. Take on an empty stomach at least 1 hour before or 2 hours after food.    ? fluticasone/salmeterol (ADVAIR) 100/50 mcg inhalation disk Inhale 1 puff by mouth into the lungs daily.   ? lactobacillus rhamnosus (GG) (CULTURELLE) 10 billion cell cap Take 1 capsule by mouth daily with breakfast.   ? magnesium oxide (MAG-OX) 400 mg (241.3 mg magnesium) tablet Take one tablet by mouth daily.   ? metOLazone (ZAROXOLYN) 2.5 mg tablet Take one-half tablet by mouth three times weekly. Take metolazone 1.25 mg on Monday-Wednesday and Friday   ? polyethylene glycol 3350 (MIRALAX) 17 g packet Take one packet by mouth daily. (Patient taking differently: Take 17 g by mouth as Needed.)   ? potassium chloride SR (KLOR-CON M20) 20 mEq tablet Take three tablets by mouth twice daily AND two tablets at bedtime daily. Take 60 meq every am: take 60 meq every noon: take 40 meq every evening   ? rivaroxaban (XARELTO) 15 mg tablet Take one tablet by mouth daily with breakfast. Take with food. Hold the day before and the morning of your CardioMEMS procedure.   ? rosuvastatin (CRESTOR) 5 mg tablet Take one tablet by mouth three times weekly.   ? senna (SENOKOT) 8.6 mg tablet Take 2 tablets by mouth at bedtime as needed for Constipation.   ? senna/docusate (SENOKOT-S) 8.6/50 mg tablet Take two tablets by mouth twice daily as needed.   ? sildenafiL (VIAGRA) 100 mg tablet TAKE 1 TABLET BY MOUTH ONCE DAILY AS NEEDED FOR SEXUAL ACTIVITY   ? tafamidis (VYNDAMAX) 61 mg capsule Take one capsule by mouth daily.   ? VENTOLIN HFA 90 mcg/actuation inhaler Inhale 1 puff by mouth into the lungs as Needed.

## 2020-11-02 ENCOUNTER — Encounter: Admit: 2020-11-02 | Discharge: 2020-11-02 | Payer: MEDICARE

## 2020-11-02 NOTE — Telephone Encounter
Patient called about receiving the certified letter for the delayed treatment on his skin cancer. Patient expressed that he recently has been undergoing a TAVR work-up and procedure. He explained that has been his main priority and has put off the treatment of his skin cancers until he has recovered from the procedure. Patient and wife said they will call the office when ready to receive treatment for the skin cancer.

## 2020-11-06 ENCOUNTER — Encounter: Admit: 2020-11-06 | Discharge: 2020-11-06 | Payer: MEDICARE

## 2020-11-06 DIAGNOSIS — I5030 Unspecified diastolic (congestive) heart failure: Secondary | ICD-10-CM

## 2020-11-06 LAB — BASIC METABOLIC PANEL
ANION GAP: 19 — ABNORMAL HIGH (ref 3–13)
BLD UREA NITROGEN: 83 — ABNORMAL HIGH (ref 7–25)
CALCIUM: 9.3 (ref 8.6–10.3)
CHLORIDE: 93 — ABNORMAL LOW (ref 98–110)
CO2: 25 (ref 21–31)
CREATININE: 2.7 — ABNORMAL HIGH (ref 0.4–1.25)
GFR ESTIMATED: 24 — ABNORMAL LOW (ref >60)
GLUCOSE,PANEL: 132 — ABNORMAL HIGH (ref 65–99)
POTASSIUM: 3.6 (ref 3.5–5.1)
SODIUM: 133 — ABNORMAL LOW (ref 135–145)

## 2020-11-06 MED ORDER — METOLAZONE 2.5 MG PO TAB
2.5 mg | ORAL_TABLET | ORAL | 1 refills | 84.00000 days | Status: AC
Start: 2020-11-06 — End: ?

## 2020-11-12 ENCOUNTER — Encounter: Admit: 2020-11-12 | Discharge: 2020-11-12 | Payer: MEDICARE

## 2020-11-13 ENCOUNTER — Encounter: Admit: 2020-11-13 | Discharge: 2020-11-13 | Payer: MEDICARE

## 2020-11-13 DIAGNOSIS — I5030 Unspecified diastolic (congestive) heart failure: Secondary | ICD-10-CM

## 2020-11-13 DIAGNOSIS — I5032 Chronic diastolic (congestive) heart failure: Secondary | ICD-10-CM

## 2020-11-13 LAB — BASIC METABOLIC PANEL
ANION GAP: 21 — ABNORMAL HIGH (ref 0–14)
BLD UREA NITROGEN: 94 — ABNORMAL HIGH (ref 8.4–25.7)
CALCIUM: 9.4 (ref 8.8–10.0)
CHLORIDE: 91 — ABNORMAL LOW (ref 98–107)
CO2: 23 (ref 23–31)
CREATININE: 3 — ABNORMAL HIGH (ref 0.72–1.25)
GFR ESTIMATED: 20 (ref >59)
GLUCOSE,PANEL: 169 — ABNORMAL HIGH (ref 70–105)
POTASSIUM: 3.7 (ref 3.5–5.1)
SODIUM: 131 — ABNORMAL LOW (ref 136–145)

## 2020-11-13 MED ORDER — BUMETANIDE 2 MG PO TAB
5 mg | ORAL_TABLET | Freq: Two times a day (BID) | ORAL | 1 refills | Status: AC
Start: 2020-11-13 — End: ?

## 2020-11-13 MED FILL — VYNDAMAX 61 MG PO CAP: 61 mg | ORAL | 30 days supply | Qty: 30 | Fill #4 | Status: AC

## 2020-11-14 ENCOUNTER — Encounter: Admit: 2020-11-14 | Discharge: 2020-11-14 | Payer: MEDICARE

## 2020-11-14 DIAGNOSIS — I5032 Chronic diastolic (congestive) heart failure: Secondary | ICD-10-CM

## 2020-11-15 NOTE — Progress Notes
ThanDate of Service: 11/20/2020    Ray [name] is a 85 y.o. male.       HPI      I had the pleasure of seeing Ray [name], who is accompanied by his wife Ray [name] today in [place] cardiovascular medicine clinic for wild-type amyloidosis follow-up.     He has medical history that is significant for wild-type amyloidosis, Cardiomems in situ, atrial fibrillation status post ablation, severe aortic stenosis ,basal cell skin cancer and CKD stage III.    He was hospitalized at [facility] from 7/27-7/28 for severe aortic stenosis, he underwent elective TAVR with Dr. Helen Hashimoto and Dr. Paris Lore.  He tolerated procedure well,his xarelto was resumed on POD#1.  His CardioMEMS PAD on POD 1 was 23, threshold is 20-24.  The post operative echo showed a #23 mm S3 bioprosthetic valve without stenosis or regurgitation.  He saw Janelle McClymont on 8/3 for follow up and had been feeling well, he had slight increase in swelling in lower legs and had felt his dry weight was 145-147 at home.  He was anticipating starting cardiac rehab in [place].     He had labs dawn on 8/16, that showed significant increase in BUN and creatinine, I had recommended that he hold his bumex and metolazone until 8/19 am and then restart bumex 5 mg po BID.    Today Ray [name] reports that he feels fatigued and increasingly short of breath when he is walking.He reports that his home weight has gone up to 149 pounds, he did take a full tablet of metolazone yesterday and is scheduled to have 1/2 tablet tomorrow.  He reports that his pulse rate has ran between 55 to 60s, home blood pressure today was 120/60.  He denies any chest pain.  He wears a Fitbit and the Fitbit reports that he is only sleeping about 3 hours at night.  He has tried melatonin as well as Tylenol PM.  He is to be able to sleep about 5 to 6 hours.  He is taking his cardiovascular medications as prescribed.  He has not been able to go to cardiac rehab due to fatigue.           Vitals:    11/20/20 1254   BP: 118/50   BP Source: Arm, Right Upper   Pulse: 57   SpO2: 96%   O2 Device: None (Room air)   PainSc: Zero   Weight: 69.9 kg (154 lb)   Height: 162.6 cm (5' 4)     Body mass index is 26.43 kg/m?[name]     Past Medical History  Patient Active Problem List    Diagnosis Date Noted   ? 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  ?  01/26/2019 Echo EF:?60%  IVS 1.50 cm (Range: 0.6 - 1.0)         LV PW 1.40 cm (Range: 0.6 - 1.0)         ?  1. Moderate concentric left ventricular hypertrophy  2. Unable to assess diastolic function due to atrial fibrillation  3. Moderate biatrial dilatation  4. Normal right ventricular size and systolic function  5. Normal central venous pressure  6. Mild calcific aortic valve stenosis. ?Mean gradient is 16 mmHg with a peak velocity of 2.8 m/s. ?Trace aortic valve regurgitation is seen  7. Mitral valve is structurally okay. ?There is moderate central regurgitation. ?No stenosis  8. Moderate tricuspid valve regurgitation  9. Pulmonary artery static pressure measures at 33 mmHg   06/28/2019 TC PYP Multiview planar views and SPECT imaging confirms the presence of severe marked diffuse global left ventricular and right ventricular myocardial uptake of tracer. ?There is normal rib and bone uptake.  ?  Two hour heart/contralateral lung (H/CL) ratio: ?1.977?this metric is abnormal and highly suggestive of ATTR cardiac amyloidosis.?  ?  Visual Semi-Quantitative Grading Scale Analysis:?  The study is grade 3 indicating myocardial uptake greater than rib and bone uptake. ?The pattern is highly and strongly suggestive of ATTR cardiac amyloidosis.  ?  SUMMARY/OPINION:   This study is abnormal and strongly suggestive of ATTR cardiac amyloidosis. ?Left ventricular myocardial uptake is diffuse, intense, and global. ?There is also prominent right ventricular free wall uptake of tracer. ?The pattern is typical for ATTR cardiac amyloidosis   ? Cardiac MRI ?   07/25/2019 Kappa/Lambda FLC ?  ? Ref. Range 07/25/2019 15:48   Kappa, FLC Latest Ref Range: 0.33 - 1.94 MG/DL 1.61 (H)   Lambda, FLC Latest Ref Range: 0.57 - 2.63 MG/DL 0.96 (H)   Kappa/Lambda FLC Latest Ref Range: 0.26 - 1.65  1.83 (H)      07/25/2019 Serum Immunofixation ?NO PARAPROTEIN SEEN   cancelled Urine Immunofixation ?Patient unable to provide urine sample 4/26    07/25/2019 Genetic Testing ?Negative   ? Biopsy (Fat Pad, Cardiac, Bone Marrow) ?   ? GI Symptoms ?   ?+? 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   ?  ? Ref. Range 07/25/2019 15:48   B Type Natriuretic Peptide Latest Ref Range: 0 - 100 PG/ML 966.0 (H)   Troponin-I Latest Ref Range: 0.0 - 0.05 NG/ML 0.05   ?  Completed:  -Tafamidis start 08/2019. PA approved, pt approved for [employer] patient assistance from 09/01/2019 to 03/30/2020  -Baseline KCCQ12 completed 5/18/202, Summary Score 61.46  -Baseline 6 min walk completed 08/16/2019, distance walked 950 feet, 289.75 meters, duration of test 6 minutes  -Baseline CPET completed 10/17/2019       ? S/P TAVR (transcatheter aortic valve replacement) 10/25/2020   ? Hypokalemia 10/25/2020   ? Nonrheumatic mitral valve regurgitation 09/06/2020   ? Nonrheumatic tricuspid valve regurgitation 09/06/2020   ? Nonrheumatic aortic valve stenosis 09/06/2020   ? Asymptomatic microscopic hematuria 12/25/2019   ? S/P left pulmonary artery pressure sensor implant placement 12/15/2019     *Patient has CardioMEMS (Pulmonary Artery Pressure Sensor)* Please call [name], CardioMEMs Program Coordinator [phone] or page Heart Failure Rounding Team if patient is admitted or presents to ED*          ? Chronic heart failure with preserved ejection fraction (HCC) 12/15/2019   ? Acute on chronic diastolic CHF (congestive heart failure), NYHA class 2 (HCC) 11/14/2019   ? S/P ablation of atrial fibrillation 05/18/2018   ? Lightheadedness 03/16/2018   ? Hospitalization within last 30 days 12/24/2017   ? Atrial fibrillation with RVR (HCC) 12/14/2017   ? Chronic kidney disease, stage 4 (severe) (HCC) 12/13/2017   ? Bradycardia 12/12/2017   ? Bilateral leg edema 12/11/2017   ? On amiodarone therapy 12/11/2017   ? Heart failure with preserved ejection fraction (HCC) 01/01/2016   ? NSVT (nonsustained ventricular tachycardia) (HCC) 06/12/2015   ? Medication side effects 11/09/2014   ? Heart palpitations 09/19/2014   ?  Longstanding persistent atrial fibrillation (HCC) 03/17/2013     03/26/2018 - ECHO:  Moderate concentric LVH seen. The EF is about 55%.  The atria are slightly dilated.  The RV function is mildly decreased.  Mild to moderate MR and TR seen.  Mild AI is present.  Based on the gradients and appearance, the AS appears to be mild. The mean gradient is 16 mm Hg with a peak velocity of 2.6 m/s.  03/26/2018 - Zio Patch:  Predominant rhythm was normal sinus.  Two runs of ventricular tachycardia occurred, they were monomorphic, the longest run lasted 3 seconds, this episode occurred on April 08, 2018 at 9:41 PM.  One episode of AIVR also appeared to be present, it lasted approximately 4.6 seconds  Brief and rare episodes of SVT that probably represent atrial fibrillation, the longest lasting 6.3 seconds.  No evidence of atrial flutter and no evidence of high degree AV block.  05/12/2018 - LAAA:  Persistent Atrial Fibrillation status post successful pulmonary vein antral isolation.  Cavotricuspid Isthmus Ablation.  Ablation of Fractionated Intracardiac Electrograms.  Ablation for an Additional Discrete Arrhythmia-LA AFL after AFIB/PVI Ablation  06/23/2018 - Cardioversion:  Successful direct current cardioversion from symptomatic atrial fibrillation to sinus rhythm.  03/16/2019 - DCCV: Successful direct current cardioversion from atrial fibrillation to sinus rhythm.     ? Systolic murmur of aorta 02/04/2013   ? SOB (shortness of breath) 02/04/2013   ? Carotid bruit present 01/23/2012   ? Overweight (BMI 25.0-29.9) 01/30/2011   ? Essential hypertension 01/30/2009   ? Hyperlipidemia 01/30/2009     Hyperlipidemia, on statin therapy     ? Skin cancer 01/30/2009   ? Seasonal allergic reaction 01/30/2009         Review of Systems   Constitutional: Negative.   HENT: Negative.    Eyes: Negative.    Cardiovascular: Negative.    Respiratory: Negative.    Endocrine: Negative.    Hematologic/Lymphatic: Negative.    Skin: Negative.    Musculoskeletal: Negative.    Gastrointestinal: Positive for constipation.   Genitourinary: Negative.    Neurological: Negative.    Psychiatric/Behavioral: Negative.    Allergic/Immunologic: Negative.        Physical Exam  General Appearance: In NAD.   Neck Veins: 7 cm JVP, neck veins are distended, + HJR   Chest Inspection: chest is normal in appearance   Respiratory Effort: breathing comfortably, no respiratory distress   Auscultation/Percussion: lungs clear to auscultation, no rales, rhonchi or wheezing   Cardiac Rhythm: irregularly irregular rhythm and normal rate   Cardiac Auscultation: S1, S2 normal, no rub, no definite S3  or S4   Murmurs: grade iii/vi systolic ejection murmur at left mid axillary line  Peripheral Circulation: normal peripheral circulation   Pedal Pulses: normal symmetric pedal pulses   Lower Extremity Edema: +1 mm bilateral peripheral edema at the ankles.  Venous discoloration. .  Abdominal Exam: soft non-tender, no obvious masses, bowel sounds normal   Gait & Station: walks without assistance   Orientation: oriented to person, place and time   Affect & Mood: appropriate and sustained affect   Language and Memory: patient responsive and seems to comprehend information   Neurologic Exam: neurological assessment grossly intact   Vital signs were reviewed      Cardiovascular Studies: echo 10/11/2020   ? ?The left ventricular size is normal. Moderate concentric hypertrophy. The left ventricular systolic function is low normal. The ejection fraction by Simpson's biplane method is 50%. There are  no segmental wall motion abnormalities.  ? The right ventricle is moderately dilated. The right ventricular systolic function is mildly reduced.  ? Moderate to severe tricuspid and mitral valve regurgitation.  ? The estimated PASP 72 mmHg with elevated central pressures.  ? Paradoxical low flow low gradient likely severe aortic valve stenosis with mean gradient of 20 mmHg, SVI - 18.   There is no significant change compared to prior study (08/07/2020 ).   ?      Echo 10/25/2020:Interpretation Summary   ? The left ventricle appears small. Moderate concentric hypertrophy. The visually estimated ejection fraction is 60%. The ejection fraction by Simpson's biplane method is 57%. There are no segmental wall motion abnormalities. Cannot determine left ventricular diastolic function.  ? Right Ventricle: The right ventricle is mildly dilated. The right ventricular systolic function is at the lower limit of normal. PASP is estimated at 32 mmHg.  ? Left Atrium: Severely dilated. Right Atrium: Severely dilated.  ? Mitral Valve: Normal valve structure. No stenosis. Severe regurgitation. The jet is centrally directed.  ? Tricuspid Valve: Normal valve structure. No stenosis. Moderate to severe regurgitation.  ? Aortic Valve: There is a 35 mm S3 bioprosthetic valve present. The prosthetic valve is normal. No stenosis. No regurgitation.  ? The aortic root and ascending aorta are normal in size.  ? No pericardial effusion.    CHEST: 09/28/2020  1. Thickening and calcification of the aortic valve with a normal caliber   ascending thoracic aorta.   2. Development of several sub-5 mm nodules scattered throughout both   lungs. These are nonspecific though are most likely infectious or inflammatory. In a low-risk patient no follow-up needed. In a high-risk   patient follow-up chest CT at 12 months is suggested. ?   3. Cardiomegaly and coronary artery disease   4. Pulmonary hypertension     ABDOMEN AND PELVIS:   1. Normal caliber abdominal aorta and common iliac arteries with moderate   diffuse atherosclerosis.   2. Moderate to severe focal stenosis at the origin of the SMA.   3. Moderate distal colonic diverticulosis. ?     6 Minute Walk Last Results 06/26/2020 08/16/2019   SpO2:  Pre-Activity 99 99   Pulse:  POST-Activity 94 122   BP:  POST-Activity 126/76 151/76   SpO2:  POST-Activity 99 96   Distance Walked in Meters 152.5 289.75       Problems Addressed Today  Encounter Diagnoses   Name Primary?   ? Aortic stenosis, mild Yes       Assessment and Plan   Wild-type ATTR amyloidosis:   -He has had a previous technetium pyrophosphate scan on 06/28/2019 that showed a 2-hour heart/contralateral lung ratio of 1.97 with visual semiquantitative grading grade 3, abnormal and highly suggestive of ATTR cardiac amyloidosis.   - He has had kappa 7.45, lambda 4.07 and free light chain ratio 1.83 on 07/25/2019  -He had serum electrophoresis that showed no paraprotein.   -See 6-minute walk noted above  -He has completed a KCC Q score that was 72.92 indicative of fair to good quality of life.    -He is on tafamidis 61 mg daily.    -He had a baseline CPET on 7/19 2021, will repeat this after he is recovered from his TAVR.  -He had repeat TC PYP on 06/11/2020, it showed 2-hour heart/contralateral lung ratio reduced to 1.7, visual semiquantitative grading scale remains at 3.  -Follow-up appointment with Dr. [name] on 9/14.  Of note  Dr. [name] had talked to him about potentially starting Onpattro for open label extension of the Apollo B trial.       Heart failure with preserved ejection fraction:  - He appears euvolemic on physical exam.  He is currently taking Bumex 5 mg twice daily and metolazone as instructed (currently 2.5 mg on Mondays and Friday and 1.25 on Wednesday.  His CardioMEMS reading today was 22, threshold is 20-24, I called and spoke to Sallyanne Kuster.  CardioMEMS in situ: -12/15/19 IMPLANT RHC:?  BP:?126/76 (99)  RA:?27  RV:?60/32  PA:?57/31 (42)  PCWP:?30  TPG:?12  PVR:?3.8  SVR:?1840.  CO (Fick):?3.1  CI (Fick):?1.7?  AVO2 Diff 7.18?  Daily CardioMEMS Readings  ?Goal PA Diastolic:  22  PA Diastolic Thresholds:  20-24    .  Severe aortic stenosis status post TAVR with Dr. Paris Lore in [place] on 7/27.: - He had CT chest and abdomen with contrast on 7/1 that showed thickening and calcification of the aortic valve with a normal caliber ascending thoracic aorta, several sub-5 mm nodules scattered throughout both lungs, cardiomegaly, coronary artery disease and pulmonary hypertension.    -He had postprocedure echo on 7/28 that showed prosthetic valve was normal, no stenosis or regurgitation.  He is scheduled for follow-up with Janelle on 9/9.  Of note he has worsening fatigue the last 5 to 6 days, I got an EKG today that showed sinus rhythm 60 bpm, is unable to calculate the PR interval, there is a single PVC noted.  The previous EKG on 7/28 showed sinus rhythm 77 bpm with first-degree AV block.  He is on no beta-blockers, however continues to take amiodarone 200 mg daily.  I ordered a Zio patch for 2 weeks to assess for further bradycardia he has had previous atrial fibrillation ablations in the past..  I will copy the structural heart team on this.  Severe mitral regurgitation that is noted on most recent echo, in the future he may need a MitraClip..    CKD stage III: Last chemistry on 8/19 showed creatinine 2.75 improved from 3.09 on 8/16.  Of note he is hyponatremic with sodium 131.  His potassium is a bit low at 3.4 and he is having muscle cramps, will increase the potassium to 60 M EQ 3 times daily and have him take an extra 20 M EQ on metolazone days, he is scheduled to get a repeat BMP in 1 week. Atrial fibrillation: He continues to take amiodarone 200 mg p.o.daily, he is on oral anticoagulation with Xarelto 15 mg daily, he denies any upper or lower GI bleeding symptoms.  He has been in atrial fibrillation since at least 2020, has had previous ablations.       I have personally documented the HPI, exam and medical decision making.  Patient education: I reviewed recent lab results and current medications, medication instructions, discussed heart failure signs & symptoms,  low  sodium diet, fluid restriction and daily weights.I have instructed the patient on the plan of care and they verbalize understanding of the plan. Please see AVS for full patient teaching. Patient advised to call our office if s/he has any problems, questions, worsening symptoms, or concerns prior to the next appointment.   Thanks for allowing me to see this nice patient. If I can be of additional assistance, please don't hesitate to contact me.     [name] AGPCNP-BC, [facility]  Pager [phone]  Collaborating physician: Dr. Vanetta Shawl    Total Time Today was  45 minutes in the following activities: Preparing to see the patient, Obtaining and/or reviewing separately obtained history, Performing a medically appropriate examination and/or evaluation, Counseling and educating the patient/family/caregiver, Ordering medications, tests, or procedures, Referring and communication with other health care professionals (when not separately reported), Documenting clinical information in the electronic or other health record, Independently interpreting results (not separately reported) and communicating results to the patient/family/caregiver and Care coordination (not separately reported)           Encounter Diagnoses   Name Primary?   ? Essential (primary) hypertension Yes   ? Hyperlipidemia, unspecified hyperlipidemia type    ? Non-sustained ventricular tachycardia (HCC)    ? Acute on chronic diastolic (congestive) heart failure (HCC)    ? Longstanding persistent atrial fibrillation (HCC)                          Current Medications (including today's revisions)  ? acetaminophen (TYLENOL) 325 mg tablet Take two tablets by mouth every 6 hours as needed.   ? allopurinoL (ZYLOPRIM) 300 mg tablet Take 300 mg by mouth daily. Take with food.   ? amiodarone (CORDARONE) 200 mg tablet Take one tablet by mouth daily.   ? aspirin 81 mg chewable tablet Chew one tablet by mouth daily. Take with food.   ? bumetanide (BUMEX) 2 mg tablet Take 2.5 tablets by mouth twice daily. (Hold Bumex on 11/13/20, 11/14/20, and 11/15/20)   ? cholecalciferol (vitamin D3) (VITAMIN D3) 1,000 units tablet Take 1,000 Units by mouth daily.   ? docusate (COLACE) 100 mg capsule Take 100 mg by mouth twice daily.   ? empagliflozin (JARDIANCE) 10 mg tablet Take one tablet by mouth daily.   ? esomeprazole DR(+) (NEXIUM) 20 mg capsule Take 20 mg by mouth daily. Take on an empty stomach at least 1 hour before or 2 hours after food.   ? ferrous sulfate (FEOSOL) 325 mg (65 mg iron) tablet Take 325 mg by mouth twice daily. Take on an empty stomach at least 1 hour before or 2 hours after food.    ? fluticasone/salmeterol (ADVAIR) 100/50 mcg inhalation disk Inhale 1 puff by mouth into the lungs daily.   ? lactobacillus rhamnosus (GG) (CULTURELLE) 10 billion cell cap Take 1 capsule by mouth daily with breakfast.   ? magnesium oxide (MAG-OX) 400 mg (241.3 mg magnesium) tablet Take one tablet by mouth daily.   ? polyethylene glycol 3350 (MIRALAX) 17 g packet Take one packet by mouth daily. (Patient taking differently: Take 17 g by mouth as Needed.)   ? potassium chloride SR (KLOR-CON M20) 20 mEq tablet Take three tablets by mouth twice daily AND two tablets at bedtime daily. Take 60 meq every am: take 60 meq every noon: take 40 meq every evening   ? rivaroxaban (XARELTO) 15 mg tablet Take one tablet by mouth daily with breakfast. Take with food.   ? rosuvastatin (CRESTOR) 5 mg tablet Take one tablet by mouth three times weekly.   ? senna (SENOKOT) 8.6 mg tablet Take 2 tablets by mouth at bedtime as needed for Constipation.   ? senna/docusate (SENOKOT-S) 8.6/50 mg tablet Take two tablets by mouth twice daily as needed.   ? sildenafiL (VIAGRA) 100 mg tablet TAKE 1 TABLET BY MOUTH ONCE DAILY AS NEEDED FOR SEXUAL ACTIVITY   ? tafamidis (VYNDAMAX) 61 mg capsule Take one capsule by mouth daily.   ? VENTOLIN HFA 90 mcg/actuation inhaler Inhale 1 puff by mouth into  the lungs as Needed.

## 2020-11-16 ENCOUNTER — Encounter: Admit: 2020-11-16 | Discharge: 2020-11-16 | Payer: MEDICARE

## 2020-11-16 NOTE — Telephone Encounter
Pt held Bumex 8/16, 8/17 and 8/18 per provider instructions. COPY OF PROVIDER NOTE FROM 11/13/20: He is to take one 20 mEq daily on 11/14/20 and 11/15/20. He is to repeat labs on Friday morning, 11/16/20 as well. Then on Friday, 11/16/20 he will resume Bumex at a lower dose of 5 mg (two and one-half tabs) twice daily. And potassium will be resumed as previously ordered. Three 20 mEq tabs every morning and at noon, and two 20 mEq tabs in the evening     PAD elevated above thresholds. Pt having labs drawn today at [facility].  Dr. [name] notified.      DAILY CardioMEMS READINGS     Goal PA Diastolic: 22 mmHg  PA Diastolic Thresholds: 18-24 mmHg          CARDIAC MEDS:    Home Medications    Medication Sig   amiodarone (CORDARONE) 200 mg tablet Take one tablet by mouth daily.   aspirin 81 mg chewable tablet Chew one tablet by mouth daily. Take with food.   bumetanide (BUMEX) 2 mg tablet Take 2.5 tablets by mouth twice daily. (Hold Bumex on 11/13/20, 11/14/20, and 11/15/20)   empagliflozin (JARDIANCE) 10 mg tablet Take one tablet by mouth daily.   ferrous sulfate (FEOSOL) 325 mg (65 mg iron) tablet Take 325 mg by mouth twice daily. Take on an empty stomach at least 1 hour before or 2 hours after food.    magnesium oxide (MAG-OX) 400 mg (241.3 mg magnesium) tablet Take one tablet by mouth daily.   potassium chloride SR (KLOR-CON M20) 20 mEq tablet Take three tablets by mouth twice daily AND two tablets at bedtime daily. Take 60 meq every am: take 60 meq every noon: take 40 meq every evening   rivaroxaban (XARELTO) 15 mg tablet Take one tablet by mouth daily with breakfast. Take with food. Hold the day before and the morning of your CardioMEMS procedure.   rosuvastatin (CRESTOR) 5 mg tablet Take one tablet by mouth three times weekly.   sildenafiL (VIAGRA) 100 mg tablet TAKE 1 TABLET BY MOUTH ONCE DAILY AS NEEDED FOR SEXUAL ACTIVITY   tafamidis (VYNDAMAX) 61 mg capsule Take one capsule by mouth daily.       RECENT LABS:    Basic Metabolic Profile    Lab Results   Component Value Date/Time    NA 131 (L) 11/13/2020 09:00 AM    K 3.7 11/13/2020 09:00 AM    CA 9.4 11/13/2020 09:00 AM    CL 91 (L) 11/13/2020 09:00 AM    CO2 23 11/13/2020 09:00 AM    GAP 21 11/13/2020 09:00 AM    Lab Results   Component Value Date/Time    BUN 94 (H) 11/13/2020 09:00 AM    CR 3.09 (H) 11/13/2020 09:00 AM    GLU 169 (H) 11/13/2020 09:00 AM          UPCOMING APPOINTMENTS:    Future Appointments   Date Time Provider Department Center   11/20/2020  1:30 PM Fredricka Bonine, APRN-NP [facility] Exam   12/07/2020  2:45 PM [facility] Procedur   12/07/2020  4:00 PM McClymont, Harriet Masson, APRN-NP [facility] Exam   12/12/2020  3:15 PM Vanetta Shawl I, MD [facility] Exam   12/13/2020  1:00 PM [name], MD [facility]

## 2020-11-19 ENCOUNTER — Encounter: Admit: 2020-11-19 | Discharge: 2020-11-19 | Payer: MEDICARE

## 2020-11-19 DIAGNOSIS — T887XXA Unspecified adverse effect of drug or medicament, initial encounter: Secondary | ICD-10-CM

## 2020-11-19 DIAGNOSIS — I5032 Chronic diastolic (congestive) heart failure: Secondary | ICD-10-CM

## 2020-11-19 DIAGNOSIS — Z79899 Other long term (current) drug therapy: Secondary | ICD-10-CM

## 2020-11-19 LAB — BASIC METABOLIC PANEL
ANION GAP: 14 (ref 0–14)
BLD UREA NITROGEN: 92 — ABNORMAL HIGH (ref 8.4–25.7)
CALCIUM: 9.4 (ref 8.8–10)
CHLORIDE: 92 — ABNORMAL LOW (ref 98–107)
CO2: 28 (ref 23–31)
CREATININE: 2.7 — ABNORMAL HIGH (ref 0.72–1.25)
GFR ESTIMATED: 23 (ref >59)
GLUCOSE,PANEL: 142 — ABNORMAL HIGH (ref 70–105)
POTASSIUM: 3.4 — ABNORMAL LOW (ref 3.5–5.1)
SODIUM: 131 — ABNORMAL LOW (ref 136–145)

## 2020-11-19 MED ORDER — RIVAROXABAN 15 MG PO TAB
15 mg | ORAL_TABLET | Freq: Every day | ORAL | 11 refills | 30.00000 days | Status: AC
Start: 2020-11-19 — End: ?

## 2020-11-19 MED ORDER — ROSUVASTATIN 5 MG PO TAB
5 mg | ORAL_TABLET | ORAL | 3 refills | 90.00000 days | Status: AC
Start: 2020-11-19 — End: ?

## 2020-11-19 MED ORDER — AMIODARONE 200 MG PO TAB
200 mg | ORAL_TABLET | Freq: Every day | ORAL | 1 refills | 42.00000 days | Status: AC
Start: 2020-11-19 — End: ?

## 2020-11-19 NOTE — Telephone Encounter
Received fax requesting prescription clarification for Xarelto. Spoke with pharmacist who said they just needed refill authorizations for rosuvastatin, xarelto, and amiodarone. Medications verified with OV notes and labs, refills sent to pharmacy.     Amiodarone Monitoring status as of 11/19/20:     Amiodarone monitoring complete.  Next amiodarone review is due in 30 days.    Most recent lab results  Lab Results   Component Value Date/Time    AST 21 10/22/2020 01:48 PM    ALT 14 10/22/2020 01:48 PM    TSH 2.53 09/21/2019 12:04 PM    FREET4R 1.08 05/28/2015 12:00 AM         Procedures  Last chest X-Ray: 10/25/2020  Last PFT: 09/11/2020  Last eye exam: unk      TSH with Free T4 lab needed. Order placed and [facility] message sent to patient.     Labs and/or CXR for amiodarone surveillance due per Amiodarone protocol.  Lab requisitions/orders and letter explaining need for testing mailed to patient or given to patient in clinic.

## 2020-11-19 NOTE — Progress Notes
URGENT! URGENT! URGENT!    To: [facility]  Fax: [phone]      RE: Ray [name]  DOB: 1935/10/26  MRN: [id number]    Medical Records request for continuity of care.  Ray [name] is scheduled for an appointment on 11/20/20 with [name], APRN in the Advanced Heart Failure clinic at [address] [facility].    Please fax records to [phone]   [facility]  [address] [place], [place] [address]  Ph. [phone] Fax. [phone]    Requested Records:   o Recent Labs in month of August, 2022 (BMP)          CONFIDENTIALITY NOTICE  This facsimile transmission contains confidential information, some or all of which may be protected health information as defined by the [place] (HIPAA) Privacy Rule.  The information contained in this facsimile is intended only for the exclusive and confidential use of the designated recipient named above.  If you are not the designated recipient (or an employee or agent responsible for delivering this facsimile transmission to the designated recipient), you are hereby notified that any disclosure, dissemination, distribution or copying of this information is strictly prohibited and may be subject to legal restriction or sanction. If you have received this transmission in error, immediately notify the Privacy Officer at [phone] to arrange for the return or destruction of the information and all copies.    [place], [place] - Phone [phone] - Fax [phone] - [email or web address]

## 2020-11-20 ENCOUNTER — Ambulatory Visit: Admit: 2020-11-20 | Discharge: 2020-11-20 | Payer: MEDICARE

## 2020-11-20 ENCOUNTER — Encounter: Admit: 2020-11-20 | Discharge: 2020-11-20 | Payer: MEDICARE

## 2020-11-20 DIAGNOSIS — I350 Nonrheumatic aortic (valve) stenosis: Secondary | ICD-10-CM

## 2020-11-20 DIAGNOSIS — E785 Hyperlipidemia, unspecified: Secondary | ICD-10-CM

## 2020-11-20 DIAGNOSIS — J302 Other seasonal allergic rhinitis: Secondary | ICD-10-CM

## 2020-11-20 DIAGNOSIS — J45909 Unspecified asthma, uncomplicated: Secondary | ICD-10-CM

## 2020-11-20 DIAGNOSIS — N189 Chronic kidney disease, unspecified: Secondary | ICD-10-CM

## 2020-11-20 DIAGNOSIS — I5030 Unspecified diastolic (congestive) heart failure: Secondary | ICD-10-CM

## 2020-11-20 DIAGNOSIS — I358 Other nonrheumatic aortic valve disorders: Secondary | ICD-10-CM

## 2020-11-20 DIAGNOSIS — I10 Essential (primary) hypertension: Secondary | ICD-10-CM

## 2020-11-20 DIAGNOSIS — I472 Ventricular tachycardia: Secondary | ICD-10-CM

## 2020-11-20 DIAGNOSIS — C4490 Unspecified malignant neoplasm of skin, unspecified: Secondary | ICD-10-CM

## 2020-11-20 DIAGNOSIS — I361 Nonrheumatic tricuspid (valve) insufficiency: Secondary | ICD-10-CM

## 2020-11-20 DIAGNOSIS — E859 Amyloidosis, unspecified: Secondary | ICD-10-CM

## 2020-11-20 DIAGNOSIS — I5033 Acute on chronic diastolic (congestive) heart failure: Secondary | ICD-10-CM

## 2020-11-20 DIAGNOSIS — I340 Nonrheumatic mitral (valve) insufficiency: Secondary | ICD-10-CM

## 2020-11-20 DIAGNOSIS — I4811 Longstanding persistent atrial fibrillation: Secondary | ICD-10-CM

## 2020-11-20 DIAGNOSIS — Z8679 Personal history of other diseases of the circulatory system: Secondary | ICD-10-CM

## 2020-11-20 MED ORDER — METOLAZONE 2.5 MG PO TAB
ORAL_TABLET | ORAL | 3 refills | 84.00000 days | Status: AC
Start: 2020-11-20 — End: ?

## 2020-11-20 MED ORDER — POTASSIUM CHLORIDE 20 MEQ PO TBTQ
ORAL_TABLET | Freq: Three times a day (TID) | ORAL | 3 refills | 30.00000 days | Status: AC
Start: 2020-11-20 — End: ?

## 2020-11-20 NOTE — Progress Notes
Daily CardioMEMS Readings    Goal PA Diastolic: 22 mmHg   PA Diastolic Thresholds: 123456 mmHg

## 2020-11-20 NOTE — Progress Notes
Ambulatory (External) Cardiac Monitor Placement Record    Patient was seen today for placement of an ambulatory cardiac monitor.        BrandMaximino [name])  Serial Number: [id number]  Location where monitor was placed:  Clinic  Start Time and Date: 11/20/20 3:01 PM  Will Holter be returned by mail? Yes   Patient instructed to contact company phone number on the monitor box with questions regarding billing, placement, troubleshooting.      [name]

## 2020-11-21 ENCOUNTER — Encounter: Admit: 2020-11-21 | Discharge: 2020-11-21 | Payer: MEDICARE

## 2020-11-21 DIAGNOSIS — I420 Dilated cardiomyopathy: Secondary | ICD-10-CM

## 2020-11-21 DIAGNOSIS — I5030 Unspecified diastolic (congestive) heart failure: Secondary | ICD-10-CM

## 2020-11-21 MED ORDER — POTASSIUM CHLORIDE 20 MEQ PO TBTQ
60 meq | ORAL_TABLET | Freq: Three times a day (TID) | ORAL | 3 refills | 30.00000 days | Status: AC
Start: 2020-11-21 — End: ?

## 2020-11-21 MED ORDER — ASPIRIN 325 MG PO TAB
325 mg | Freq: Once | ORAL | 0 refills
Start: 2020-11-21 — End: ?

## 2020-11-21 NOTE — Progress Notes
Medicare is listed as patient's primary insurance coverage.  Pre-certification is not required for hospitalizations.

## 2020-11-21 NOTE — Telephone Encounter
Prior authorization request received from [place] for potasium. Call placed to pharmacy and confirmed prior auth is needed. PA initiated through CoverMyMeds for correct quantity 282 for 30 days. Call placed to pharmacy and confirmed claim will be paid with $0 copay. Too soon to refill at this time.    BIN:  [id number]  PCN:  [id number]  ID: [id number]  Grp: [id number]

## 2020-11-22 ENCOUNTER — Encounter: Admit: 2020-11-22 | Discharge: 2020-11-22 | Payer: MEDICARE

## 2020-11-22 DIAGNOSIS — I340 Nonrheumatic mitral (valve) insufficiency: Secondary | ICD-10-CM

## 2020-11-22 DIAGNOSIS — Z9889 Other specified postprocedural states: Secondary | ICD-10-CM

## 2020-11-22 DIAGNOSIS — I5030 Unspecified diastolic (congestive) heart failure: Secondary | ICD-10-CM

## 2020-11-22 NOTE — Telephone Encounter
Call to pt to review [facility] innstructions pt not avaiqablke review instructions with wife, and sent per MyChart wife verb understanding states pt will contact HF if has any questions. Will obtain CBC at [facility] well , cbc order faxed to [facility] lab at [address] [place], [place] [address] Ph) [phone] Fax) [phone]

## 2020-11-22 NOTE — Patient Instructions
CARDIAC CATHETERIZATION   PRE-ADMISSION INSTRUCTIONS    Patient Name: Ray [name]  MRN#: [id number]  Date of Birth: Jun 26, 1935 (85 y.o.)  Today's Date: 11/22/2020    PROCEDURE:  You are scheduled for a Right Heart Pressures Evaluation with Dr. Salley Scarlet Hajj.    PROCEDURE DATE AND ARRIVAL TIME:  Your procedure date is Monday 8/29.  You will receive a call from the Cath lab staff between 8:00 a.m. and noon on the business day prior to your procedure to let you know at what time to arrive on the day of your procedure.    Please check in at the Admitting Desk in the [facility] for your procedure. [facility] Entrance and and take a right. Continue down the hallway past the Cardiovascular Medicine office. That hall will take you into the [facility]. Check in at the desk on the left side.)     (If you have further questions regarding your arrival time for the CV lab, please call [phone] by 3:00pm the day before your procedure. Please leave a message with your name and number, your call will be returned in a timely manner.)    PRE-PROCEDURE APPOINTMENTS:  completed   Office visit to update history and physical (requirement within 30 days of procedure)  with  [name] ARNP  at Cardiovascular [facility]     8/25   Pre-Admission lab work required within 14 days of procedure: CBC at the lab of your choice.       Not required     COVID Testing must be completed at least 72 hours prior to procedure     FOOD AND DRINK INSTRUCTIONS  Nothing to eat after midnight before your procedure. No caffeine for 24 hours prior to your procedure. You will be under moderate sedation for your procedure.  You may drink clear liquids up to an hour before hospital arrival. This will be confirmed by the Cath lab staff the day before your procedure.     SPECIAL MEDICATION INSTRUCTIONS  Any new prescriptions will be sent to your pharmacy listed on file with [place].        rivaroxaban (Xarelto): hold 1 days and AM of procedure. Last dose on 11/24/20.     Hold the morning of your procedure Bumex, Metolazone, Potassium, Jardiance, Sildenafil, and Amiodarone       TAKE AM OF PROCEDURE:  All other meds if they do not bother your stomach to take on empty stomach    HOLD ALL erectile dysfunction medications for 3 days, unless prescribed for pulmonary hypertension.  HOLD ALL over the counter vitamins or supplements on the morning of your procedure.      Additional Instructions  If you wear CPAP, please bring your mask and machine with you to the hospital.    Take a bath or shower with anti-bacterial soap the evening before, or the morning of the procedure.     Bring photo ID and your health insurance card(s).    You will be admitted to  after your procedure, Dr [name] will see you as an in patient.    Bring an accurate list of your current medications with you to the hospital (all medications and supplements taken daily).  Please use the medication list below and write in the date and time when you took your last dose before your procedure. Update this list of medications as needed.      Wear comfortable clothes and don't bring valuables, other than photo  identification card, with you to the hospital.    Please pack a bag for an overnight stay.     Please review your pre-procedure instructions and bring them with you on the day of your procedure.  Call the office at [phone] with any questions. You may ask to speak with Dr. Vivia Budge nurse.        ALLERGIES  Allergies   Allergen Reactions    Beta Blockers [Beta-Blockers (Beta-Adrenergic Blocking Agts)] SHORTNESS OF BREATH and SEE COMMENTS     Pt had beta blocker toxicity from one dose of carvedilol       CURRENT MEDICATIONS  Outpatient Encounter Medications as of 11/22/2020   Medication Sig Dispense Refill    acetaminophen (TYLENOL) 325 mg tablet Take two tablets by mouth every 6 hours as needed.  0    allopurinoL (ZYLOPRIM) 300 mg tablet Take 300 mg by mouth daily. Take with food.      amiodarone (CORDARONE) 200 mg tablet Take one tablet by mouth daily. 90 tablet 1    aspirin 81 mg chewable tablet Chew one tablet by mouth daily. Take with food. 90 tablet 0    bumetanide (BUMEX) 2 mg tablet Take 2.5 tablets by mouth twice daily. (Hold Bumex on 11/13/20, 11/14/20, and 11/15/20) 450 tablet 1    cholecalciferol (vitamin D3) (VITAMIN D3) 1,000 units tablet Take 1,000 Units by mouth daily.      docusate (COLACE) 100 mg capsule Take 100 mg by mouth twice daily.      empagliflozin (JARDIANCE) 10 mg tablet Take one tablet by mouth daily. 90 tablet 3    esomeprazole DR(+) (NEXIUM) 20 mg capsule Take 20 mg by mouth daily. Take on an empty stomach at least 1 hour before or 2 hours after food.      ferrous sulfate (FEOSOL) 325 mg (65 mg iron) tablet Take 325 mg by mouth twice daily. Take on an empty stomach at least 1 hour before or 2 hours after food.       fluticasone/salmeterol (ADVAIR) 100/50 mcg inhalation disk Inhale 1 puff by mouth into the lungs daily.      lactobacillus rhamnosus (GG) (CULTURELLE) 10 billion cell cap Take 1 capsule by mouth daily with breakfast.      magnesium oxide (MAG-OX) 400 mg (241.3 mg magnesium) tablet Take one tablet by mouth daily. 180 tablet 1    metOLazone (ZAROXOLYN) 2.5 mg tablet Take Metolazone 2.5mg  (1 tablet) on Mondays and Fridays; take Metolazone 1.25mg  (1/2 of a tablet) on Wednesdays. 90 tablet 3    polyethylene glycol 3350 (MIRALAX) 17 g packet Take one packet by mouth daily. (Patient taking differently: Take 17 g by mouth as Needed.) 12 each 1    potassium chloride SR (KLOR-CON M20) 20 mEq tablet Take three tablets by mouth three times daily. Take additional 20 mEq on Metolazone days Mon-Wed-Fri. 282 tablet 3    rivaroxaban (XARELTO) 15 mg tablet Take one tablet by mouth daily with breakfast. Take with food. 30 tablet 11    rosuvastatin (CRESTOR) 5 mg tablet Take one tablet by mouth three times weekly. 45 tablet 3    senna (SENOKOT) 8.6 mg tablet Take 2 tablets by mouth at bedtime as needed for Constipation.      senna/docusate (SENOKOT-S) 8.6/50 mg tablet Take two tablets by mouth twice daily as needed. 90 tablet     sildenafiL (VIAGRA) 100 mg tablet TAKE 1 TABLET BY MOUTH ONCE DAILY AS NEEDED FOR SEXUAL ACTIVITY  tafamidis (VYNDAMAX) 61 mg capsule Take one capsule by mouth daily. 30 capsule 11    VENTOLIN HFA 90 mcg/actuation inhaler Inhale 1 puff by mouth into the lungs as Needed.       No facility-administered encounter medications on file as of 11/22/2020.       _________________________________________  Form completed by: Magdalen Spatz, BSN  Date completed: 11/22/20  Method: Via telephone and mailed to the patient.and MyChart

## 2020-11-23 ENCOUNTER — Encounter: Admit: 2020-11-23 | Discharge: 2020-11-23 | Payer: MEDICARE

## 2020-11-23 NOTE — Progress Notes
I spoke to Dr. Manuella Ghazi about Ray [name] reduction in hemoglobin to 8.7 from previous 12.2.  His home BP is stable at 127/55, HR 60.  He has had nose bleeds and bleeding from his skin cancer sites (ears).  He had taken his xarelto this morning.  He was instructed to not take any xarelto, he is scheduled for right heart cath on Monday.  He verbalizes understanding.

## 2020-11-26 ENCOUNTER — Encounter: Admit: 2020-11-26 | Discharge: 2020-11-26 | Payer: MEDICARE

## 2020-11-26 ENCOUNTER — Inpatient Hospital Stay: Admit: 2020-11-26 | Discharge: 2020-11-26 | Payer: MEDICARE

## 2020-11-26 DIAGNOSIS — I358 Other nonrheumatic aortic valve disorders: Secondary | ICD-10-CM

## 2020-11-26 DIAGNOSIS — J302 Other seasonal allergic rhinitis: Secondary | ICD-10-CM

## 2020-11-26 DIAGNOSIS — I340 Nonrheumatic mitral (valve) insufficiency: Secondary | ICD-10-CM

## 2020-11-26 DIAGNOSIS — C4490 Unspecified malignant neoplasm of skin, unspecified: Secondary | ICD-10-CM

## 2020-11-26 DIAGNOSIS — J45909 Unspecified asthma, uncomplicated: Secondary | ICD-10-CM

## 2020-11-26 DIAGNOSIS — N189 Chronic kidney disease, unspecified: Secondary | ICD-10-CM

## 2020-11-26 DIAGNOSIS — I361 Nonrheumatic tricuspid (valve) insufficiency: Secondary | ICD-10-CM

## 2020-11-26 DIAGNOSIS — I350 Nonrheumatic aortic (valve) stenosis: Secondary | ICD-10-CM

## 2020-11-26 DIAGNOSIS — I10 Essential (primary) hypertension: Secondary | ICD-10-CM

## 2020-11-26 DIAGNOSIS — E859 Amyloidosis, unspecified: Secondary | ICD-10-CM

## 2020-11-26 DIAGNOSIS — Z8679 Personal history of other diseases of the circulatory system: Secondary | ICD-10-CM

## 2020-11-26 DIAGNOSIS — E785 Hyperlipidemia, unspecified: Secondary | ICD-10-CM

## 2020-11-26 MED ADMIN — BUMETANIDE 0.25 MG/ML IJ SOLN [9308]: 2 mg | INTRAVENOUS | @ 23:00:00 | NDC 70860040541

## 2020-11-26 NOTE — Telephone Encounter
Erroneous encounter-disregard

## 2020-11-27 ENCOUNTER — Inpatient Hospital Stay: Admit: 2020-11-27 | Discharge: 2020-11-27 | Payer: MEDICARE

## 2020-11-27 MED ADMIN — FERROUS SULFATE 325 MG (65 MG IRON) PO TAB [3074]: 325 mg | ORAL | @ 13:00:00 | NDC 00904759161

## 2020-11-27 MED ADMIN — BUMETANIDE 0.25 MG/ML IJ SOLN [9308]: 2 mg | INTRAVENOUS | @ 13:00:00 | NDC 70860040541

## 2020-11-27 MED ADMIN — BUMETANIDE 0.25 MG/ML IJ SOLN [9308]: 2 mg | INTRAVENOUS | @ 21:00:00 | NDC 70860040541

## 2020-11-27 MED ADMIN — POTASSIUM CHLORIDE 20 MEQ PO TBTQ [35943]: 30 meq | ORAL | @ 10:00:00 | Stop: 2020-11-27 | NDC 00245531989

## 2020-11-27 MED ADMIN — MELATONIN 5 MG PO TAB [168576]: 5 mg | ORAL | @ 03:00:00 | NDC 77333052025

## 2020-11-27 MED ADMIN — PANTOPRAZOLE 40 MG PO TBEC [80436]: 40 mg | ORAL | @ 02:00:00 | NDC 00904647461

## 2020-11-27 MED ADMIN — ASCORBIC ACID (VITAMIN C) 500 MG PO TAB [664]: 500 mg | ORAL | @ 13:00:00 | NDC 00904052361

## 2020-11-27 MED ADMIN — DICLOFENAC SODIUM 1 % TP GEL [168032]: 2 g | TOPICAL | @ 05:00:00 | NDC 69097052444

## 2020-11-27 MED ADMIN — POTASSIUM CHLORIDE 10 MEQ PO TBTQ [35942]: 30 meq | ORAL | @ 10:00:00 | Stop: 2020-11-27 | NDC 68084063211

## 2020-11-27 MED ADMIN — PANTOPRAZOLE 40 MG IV SOLR [78621]: 40 mg | INTRAVENOUS | @ 17:00:00 | NDC 55150020200

## 2020-11-27 MED ADMIN — PEG-ELECTROLYTE SOLN 420 GRAM PO SOLR [79142]: 4 L | ORAL | @ 21:00:00 | NDC 52268040001

## 2020-11-28 ENCOUNTER — Encounter: Admit: 2020-11-28 | Discharge: 2020-11-28 | Payer: MEDICARE

## 2020-11-28 ENCOUNTER — Inpatient Hospital Stay: Admit: 2020-11-28 | Discharge: 2020-11-28 | Payer: MEDICARE

## 2020-11-28 MED ORDER — PROPOFOL 10 MG/ML IV EMUL 100 ML (INFUSION)(AM)(OR)
INTRAVENOUS | 0 refills | Status: DC
Start: 2020-11-28 — End: 2020-11-28
  Administered 2020-11-28: 18:00:00 30 mg via INTRAVENOUS

## 2020-11-28 MED ADMIN — POTASSIUM CHLORIDE IN WATER 10 MEQ/50 ML IV PGBK [11075]: 10 meq | INTRAVENOUS | @ 14:00:00 | Stop: 2020-11-28 | NDC 00338070541

## 2020-11-28 MED ADMIN — POTASSIUM CHLORIDE IN WATER 10 MEQ/50 ML IV PGBK [11075]: 10 meq | INTRAVENOUS | @ 13:00:00 | Stop: 2020-11-28 | NDC 00338070541

## 2020-11-28 MED ADMIN — FERROUS SULFATE 325 MG (65 MG IRON) PO TAB [3074]: 325 mg | ORAL | @ 14:00:00 | NDC 00904759161

## 2020-11-28 MED ADMIN — PANTOPRAZOLE 40 MG IV SOLR [78621]: 40 mg | INTRAVENOUS | @ 14:00:00 | NDC 55150020200

## 2020-11-28 MED ADMIN — POTASSIUM CHLORIDE IN WATER 10 MEQ/50 ML IV PGBK [11075]: 10 meq | INTRAVENOUS | @ 12:00:00 | Stop: 2020-11-28 | NDC 00338070541

## 2020-11-28 MED ADMIN — ASCORBIC ACID (VITAMIN C) 500 MG PO TAB [664]: 500 mg | ORAL | @ 14:00:00 | NDC 00904052361

## 2020-11-28 MED ADMIN — BUMETANIDE 0.25 MG/ML IJ SOLN [9308]: 2 mg | INTRAVENOUS | @ 14:00:00 | Stop: 2020-11-28 | NDC 70860040541

## 2020-11-28 MED ADMIN — ROSUVASTATIN 10 MG PO TAB [88503]: 5 mg | ORAL | @ 14:00:00 | NDC 00904677961

## 2020-11-28 MED ADMIN — BUMETANIDE 0.25 MG/ML IJ SOLN [9308]: 1 mg/h | INTRAVENOUS | @ 19:00:00 | NDC 00641600701

## 2020-11-28 MED ADMIN — POTASSIUM CHLORIDE 20 MEQ PO TBTQ [35943]: 60 meq | ORAL | @ 12:00:00 | Stop: 2020-11-28 | NDC 00245531989

## 2020-11-28 NOTE — Anesthesia Pre-Procedure Evaluation
Anesthesia Pre-Procedure Evaluation    Name: Ray [name]      MRN: [id number]     DOB: 1935-08-23     Age: 85 y.o.     Sex: male   _________________________________________________________________________     Procedure Info:   Procedure Information     Date/Time: 11/28/20 1300    Procedures:       ESOPHAGOGASTRODUODENOSCOPY WITH BIOPSY - FLEXIBLE (N/A )      COLONOSCOPY WITH BIOPSY - FLEXIBLE (N/A )      GASTROINTESTINAL TRACT IMAGING ESOPHAGUS THROUGH ILEUM - INTRALUMINAL (N/A )    Location: GI VIRTUAL ROOM / ENDO/GI    Surgeons: Samuel Jester, MD          Physical Assessment  Vital Signs (last filed in past 24 hours):  BP: 110/58 (08/31 1100)  ABP: 107/47 (08/31 0400)  Temp: 36.6 ?C (97.8 ?F) (08/31 1200)  Pulse: 69 (08/31 1100)  Respirations: 19 PER MINUTE (08/30 2000)  SpO2: 100 % (08/31 1100)  O2 Device: None (Room air) (08/31 1100)  Weight: 67.2 kg (148 lb 3.2 oz) (08/31 0600)      Patient History   Allergies   Allergen Reactions   ? Beta Blockers [Beta-Blockers (Beta-Adrenergic Blocking Agts)] SHORTNESS OF BREATH and SEE COMMENTS     Pt had beta blocker toxicity from one dose of carvedilol        Current Medications    Medication Directions   acetaminophen (TYLENOL) 325 mg tablet Take two tablets by mouth every 6 hours as needed.   allopurinoL (ZYLOPRIM) 300 mg tablet Take 300 mg by mouth daily. Take with food.   amiodarone (CORDARONE) 200 mg tablet Take one tablet by mouth daily.   aspirin 81 mg chewable tablet Chew one tablet by mouth daily. Take with food.   bumetanide (BUMEX) 2 mg tablet Take 2.5 tablets by mouth twice daily. (Hold Bumex on 11/13/20, 11/14/20, and 11/15/20)   cholecalciferol (vitamin D3) (VITAMIN D3) 1,000 units tablet Take 1,000 Units by mouth daily.   docusate (COLACE) 100 mg capsule Take 100 mg by mouth twice daily.   empagliflozin (JARDIANCE) 10 mg tablet Take one tablet by mouth daily.   esomeprazole DR(+) (NEXIUM) 20 mg capsule Take 20 mg by mouth daily. Take on an empty stomach at least 1 hour before or 2 hours after food.   ferrous sulfate (FEOSOL) 325 mg (65 mg iron) tablet Take 325 mg by mouth twice daily. Take on an empty stomach at least 1 hour before or 2 hours after food.    fluticasone/salmeterol (ADVAIR) 100/50 mcg inhalation disk Inhale 1 puff by mouth into the lungs daily.   lactobacillus rhamnosus (GG) (CULTURELLE) 10 billion cell cap Take 1 capsule by mouth daily with breakfast.   magnesium oxide (MAG-OX) 400 mg (241.3 mg magnesium) tablet Take one tablet by mouth daily.   metOLazone (ZAROXOLYN) 2.5 mg tablet Take Metolazone 2.5mg  (1 tablet) on Mondays and Fridays; take Metolazone 1.25mg  (1/2 of a tablet) on Wednesdays.   polyethylene glycol 3350 (MIRALAX) 17 g packet Take one packet by mouth daily.  Patient taking differently: Take 17 g by mouth as Needed.   potassium chloride SR (KLOR-CON M20) 20 mEq tablet Take three tablets by mouth three times daily. Take additional 20 mEq on Metolazone days Mon-Wed-Fri.   rivaroxaban (XARELTO) 15 mg tablet Take one tablet by mouth daily with breakfast. Take with food.   rosuvastatin (CRESTOR) 5 mg tablet Take one tablet by mouth three times weekly.  senna (SENOKOT) 8.6 mg tablet Take 2 tablets by mouth at bedtime as needed for Constipation.   senna/docusate (SENOKOT-S) 8.6/50 mg tablet Take two tablets by mouth twice daily as needed.   sildenafiL (VIAGRA) 100 mg tablet TAKE 1 TABLET BY MOUTH ONCE DAILY AS NEEDED FOR SEXUAL ACTIVITY   tafamidis (VYNDAMAX) 61 mg capsule Take one capsule by mouth daily.   VENTOLIN HFA 90 mcg/actuation inhaler Inhale 1 puff by mouth into the lungs as Needed.         Review of Systems/Medical History      Patient summary reviewed    PONV Screening: Non-smoker  No history of anesthetic complications  No family history of anesthetic complications      Airway - negative        Pulmonary           Asthma (Advair daily; albuterol PRN)      No recent URI      No sleep apnea      Cardiovascular       Recent diagnostic studies:          ECG and echocardiogram      Exercise tolerance: >4 METS      Beta Blocker therapy: No        Hypertension, well controlled        Valvular problems/murmurs (moderate to severe aortic stenosis, mild aortic regurgitation; moderate to severe mitral regurgitation): AS and MR      No past MI,         Dysrhythmias (Afib s/p ablation, 2 runs of NSVT on Zio patch (2019)); atrial fibrillation      No angina      No DVT      CHF (Acute on chronic heart failure with preserved ejection fraction; EF 50%); NYHA Classification: II      Hyperlipidemia      No orthopnea      Dyspnea on exertion      No syncope      Wild-type transthyretin-related (ATTR) amyloidosis     Cardiomems in place      Pulmonary hypertension (estimated PASP 72 mmHg with elevated central pressures (per echo 10/11/20))      GI/Hepatic/Renal           GERD, well controlled      No hx of liver disease     Renal disease (CKD stage 3)      Neuro/Psych       No seizures      No hx TIA      No CVA      Neuropathy (feet)      No indications/hx of sensory deficit      Musculoskeletal         No neck pain      No back pain      Endocrine/Other       No diabetes      Anemia      No blood dyscrasia      No history of blood transfusion      Malignancy (multiple BCC skin cancer s/p excision)    Constitution - negative   Physical Exam    Airway Findings      Mallampati: III      TM distance: >3 FB      Mouth opening: good    Cardiovascular Findings:       Rhythm: regular      Rate: normal      Other findings: murmur (3/6),  carotid bruit (left carotid bruit)    Pulmonary Findings:       Breath sounds clear to auscultation.    Neurological Findings:       Alert and oriented x 3    Constitutional findings:       No acute distress       Diagnostic Tests  Hematology:   Lab Results   Component Value Date    HGB 8.1 11/28/2020    HCT 24.6 11/28/2020    PLTCT 151 11/28/2020    WBC 6.1 11/28/2020    NEUT 80 11/28/2020    ANC 4.80 11/28/2020    ALC 0.49 11/28/2020    MONA 10 11/28/2020    AMC 0.63 11/28/2020    EOSA 1 11/28/2020    ABC 0.06 11/28/2020    MCV 99.8 11/28/2020    MCH 32.8 11/28/2020    MCHC 32.9 11/28/2020    MPV 8.6 11/28/2020    RDW 18.4 11/28/2020         General Chemistry:   Lab Results   Component Value Date    NA 138 11/28/2020    K 4.2 11/28/2020    CL 100 11/28/2020    CO2 26 11/28/2020    GAP 12 11/28/2020    BUN 63 11/28/2020    CR 2.21 11/28/2020    GLU 84 11/28/2020    CA 9.0 11/28/2020    ALBUMIN 3.2 11/28/2020    LACTIC 1.2 12/13/2017    MG 2.3 11/28/2020    TOTBILI 1.1 11/28/2020      Coagulation:   Lab Results   Component Value Date    PT 12.8 10/24/2020    PTT 34.8 10/24/2020    INR 1.1 10/24/2020     10/11/20 -  2D + DOPPLER ECHO    Left Ventricle The left ventricular size is normal. Moderate concentric hypertrophy. The left ventricular systolic function is low normal. The ejection fraction by Simpson's biplane method is 50%. There are no segmental wall motion abnormalities. Grade III (severe) left ventricular diastolic dysfunction. Elevated left atrial pressure.   Right Ventricle The right ventricle is moderately dilated. The right ventricular systolic function is mildly reduced.   Left Atrium Moderately dilated.   Right Atrium Moderately dilated.   IVC/SVC Elevated central venous pressure (5-10 mm Hg).   Mitral Valve Non-specific thickening. No stenosis. Moderate to severe regurgitation. There is mitral annular calcification.   Tricuspid Valve Normal valve structure. No stenosis. Moderate to severe regurgitation.   Aortic Valve The valve is calcified. Moderate to severe stenosis. Mild regurgitation.   Pulmonary Normal valve structure. No stenosis. Trace regurgitation.   Aorta The ascending aorta is mildly dilated.   Pericardium No pericardial effusion.       ? The left ventricular size is normal. Moderate concentric hypertrophy. The left ventricular systolic function is low normal. The ejection fraction by Simpson's biplane method is 50%. There are no segmental wall motion abnormalities.  ? The right ventricle is moderately dilated. The right ventricular systolic function is mildly reduced.  ? Moderate to severe tricuspid and mitral valve regurgitation.  ? The estimated PASP 72 mmHg with elevated central pressures.  ? Paradoxical low flow low gradient likely severe aortic valve stenosis with mean gradient of 20 mmHg, SVI - 18.     There is no significant change compared to prior study (08/07/2020 ).     CTA chest 71/22:  IMPRESSION     CHEST:   1. Thickening and calcification of the  aortic valve with a normal caliber   ascending thoracic aorta.   2. Development of several sub-5 mm nodules scattered throughout both   lungs. These are nonspecific though are most likely infectious or   inflammatory. In a low-risk patient no follow-up needed. In a high-risk   patient follow-up chest CT at 12 months is suggested. ?   3. Cardiomegaly and coronary artery disease   4. Pulmonary hypertension     ABDOMEN AND PELVIS:   1. Normal caliber abdominal aorta and common iliac arteries with moderate   diffuse atherosclerosis.   2. Moderate to severe focal stenosis at the origin of the SMA.   3. Moderate distal colonic diverticulosis. ?   ?    Cardiac cath 09/17/20:  ?  FINAL IMPRESSION:  Mild nonobstructive coronary artery disease.  ?  RECOMMENDATION:  Workup for TAVR as indicated    09/17/20 -  PV CAROTID ARTERY DUPLEX SCAN  1. There is mild atheromatous plaque visualized in bilateral common and internal carotid arteries  2. No hemodynamically significant (>50%) stenosis measured in?the common and internal carotid arteries bilaterally  3. There is normal antegrade flow in bilateral vertebral arteries  4. No evidence of proximal subclavian stenosis?bilaterally  ?  Compared to ultrasound from 06/08/2017 there is no significant interval change.     06/11/20 - Procedure:    SPECT CARDIAC AMYLOIDOSIS   SUMMARY/OPINION:   The study is abnormal and strongly suggestive of ATTR cardiac amyloidosis.  The heart contralateral lung ratio of 1.706 on the current exam is somewhat less than on the previous study dated 06/28/2019 when it measured 1.977.      Anesthesia Plan    ASA score: 4   Plan: [name]  Induction method: intravenous  NPO status: acceptable      Informed Consent  Anesthetic plan and risks discussed with patient.  Use of blood products discussed with patient  Blood Consent: consented      Plan discussed with: anesthesiologist and CRNA.       Lab: CBC, CMP, BNP, A1c, T&C, UA per Dr. Helen Hashimoto  Hemoglobin = 12.6  Platelets = 224  Potassium =3.9  Creatinine = 2.48  Consult: none

## 2020-11-28 NOTE — Anesthesia Post-Procedure Evaluation
Post-Anesthesia Evaluation    Name: Ray [name]      MRN: [id number]     DOB: May 13, 1935     Age: 85 y.o.     Sex: male   __________________________________________________________________________     Procedure Information     Anesthesia Start Date/Time: 11/28/20 1306    Procedures:       ESOPHAGOGASTRODUODENOSCOPY WITH BIOPSY - FLEXIBLE (N/A )      GASTROINTESTINAL TRACT IMAGING ESOPHAGUS THROUGH ILEUM - INTRALUMINAL (N/A )      COLONOSCOPY WITH BIOPSY - FLEXIBLE (N/A )    Location: GI VIRTUAL ROOM / ENDO/GI    Surgeons: Reggy Eye, MD          Post-Anesthesia Vitals  BP: 102/52 (08/31 1351)  Pulse: 51 (08/31 1354)  SpO2: 100 % (08/31 1354)  SpO2 Pulse: 53 (08/31 1354)   Vitals Value Taken Time   BP 102/52 11/28/20 1351   Temp     Pulse 51 11/28/20 1354   Respirations     SpO2 100 % 11/28/20 1354   O2 Device     ABP     ART BP           Post Anesthesia Evaluation Note    Evaluation location: ICU  Patient participation: recovered; patient participated in evaluation  Level of consciousness: sleepy but conscious    Pain score: 0  Pain management: adequate    Hydration: normovolemia  Temperature: 36.0C - 38.4C  Airway patency: adequate    Perioperative Events      Postoperative Status  Cardiovascular status: hemodynamically stable  Respiratory status: supplemental oxygen  Follow-up needed: none        Perioperative Events  There were no known complications for this encounter.

## 2020-11-29 ENCOUNTER — Encounter: Admit: 2020-11-29 | Discharge: 2020-11-29 | Payer: MEDICARE

## 2020-11-29 ENCOUNTER — Inpatient Hospital Stay: Admit: 2020-11-29 | Discharge: 2020-11-29 | Payer: MEDICARE

## 2020-11-29 DIAGNOSIS — Z8679 Personal history of other diseases of the circulatory system: Secondary | ICD-10-CM

## 2020-11-29 DIAGNOSIS — J302 Other seasonal allergic rhinitis: Secondary | ICD-10-CM

## 2020-11-29 DIAGNOSIS — E859 Amyloidosis, unspecified: Secondary | ICD-10-CM

## 2020-11-29 DIAGNOSIS — I340 Nonrheumatic mitral (valve) insufficiency: Secondary | ICD-10-CM

## 2020-11-29 DIAGNOSIS — I10 Essential (primary) hypertension: Secondary | ICD-10-CM

## 2020-11-29 DIAGNOSIS — J45909 Unspecified asthma, uncomplicated: Secondary | ICD-10-CM

## 2020-11-29 DIAGNOSIS — I350 Nonrheumatic aortic (valve) stenosis: Secondary | ICD-10-CM

## 2020-11-29 DIAGNOSIS — C4490 Unspecified malignant neoplasm of skin, unspecified: Secondary | ICD-10-CM

## 2020-11-29 DIAGNOSIS — I358 Other nonrheumatic aortic valve disorders: Secondary | ICD-10-CM

## 2020-11-29 DIAGNOSIS — I361 Nonrheumatic tricuspid (valve) insufficiency: Secondary | ICD-10-CM

## 2020-11-29 DIAGNOSIS — N189 Chronic kidney disease, unspecified: Secondary | ICD-10-CM

## 2020-11-29 DIAGNOSIS — E785 Hyperlipidemia, unspecified: Secondary | ICD-10-CM

## 2020-11-29 MED ADMIN — MIDAZOLAM 1 MG/ML IJ SOLN [10607]: 0.5 mg | INTRAVENOUS | @ 21:00:00 | Stop: 2020-11-29 | NDC 00409230516

## 2020-11-29 MED ADMIN — PANTOPRAZOLE 40 MG IV SOLR [78621]: 40 mg | INTRAVENOUS | @ 13:00:00 | NDC 55150020200

## 2020-11-29 MED ADMIN — FENTANYL CITRATE (PF) 50 MCG/ML IJ SOLN [3037]: 25 ug | INTRAVENOUS | @ 21:00:00 | Stop: 2020-11-29 | NDC 00409909425

## 2020-11-29 MED ADMIN — ASCORBIC ACID (VITAMIN C) 500 MG PO TAB [664]: 500 mg | ORAL | @ 13:00:00 | NDC 00904052361

## 2020-11-29 MED ADMIN — LIDOCAINE (PF) 10 MG/ML (1 %) IJ SOLN [95838]: 10 mL | SUBCUTANEOUS | @ 21:00:00 | Stop: 2020-11-29 | NDC 55150016205

## 2020-11-29 MED ADMIN — CEFAZOLIN INJ 1GM IVP [210319]: 2 g | INTRAVENOUS | @ 21:00:00 | Stop: 2020-11-29 | NDC 00143992490

## 2020-11-29 MED ADMIN — BUPIVACAINE (PF) 0.25 % (2.5 MG/ML) IJ SOLN [87866]: 10 mL | SUBCUTANEOUS | @ 21:00:00 | Stop: 2020-11-29 | NDC 00409155918

## 2020-11-29 MED ADMIN — SODIUM CHLORIDE 0.9 % IV SOLP [27838]: 500 mL | @ 22:00:00 | Stop: 2020-11-29 | NDC 00338004904

## 2020-11-29 MED ADMIN — POTASSIUM CHLORIDE 20 MEQ PO TBTQ [35943]: 60 meq | ORAL | @ 11:00:00 | Stop: 2020-11-29 | NDC 00245531989

## 2020-11-29 MED ADMIN — MIDAZOLAM 1 MG/ML IJ SOLN [10607]: 1 mg | INTRAVENOUS | @ 22:00:00 | Stop: 2020-11-29 | NDC 00409230516

## 2020-11-29 MED ADMIN — BUMETANIDE 0.25 MG/ML IJ SOLN [9308]: 1 mg/h | INTRAVENOUS | @ 01:00:00 | NDC 00641600701

## 2020-11-29 MED ADMIN — CHLOROTHIAZIDE SODIUM 500 MG IV SOLR [81318]: 500 mg | INTRAVENOUS | @ 08:00:00 | Stop: 2020-11-29 | NDC 63323065827

## 2020-11-29 MED ADMIN — FENTANYL CITRATE (PF) 50 MCG/ML IJ SOLN [3037]: 50 ug | INTRAVENOUS | @ 21:00:00 | Stop: 2020-11-29 | NDC 00409909425

## 2020-11-29 MED ADMIN — BUMETANIDE 0.25 MG/ML IJ SOLN [9308]: 2 mg | INTRAVENOUS | @ 06:00:00 | Stop: 2020-11-29 | NDC 70860040541

## 2020-11-29 MED ADMIN — MIDAZOLAM 1 MG/ML IJ SOLN [10607]: 1 mg | INTRAVENOUS | @ 21:00:00 | Stop: 2020-11-29 | NDC 00409230516

## 2020-11-29 MED ADMIN — FERROUS SULFATE 325 MG (65 MG IRON) PO TAB [3074]: 325 mg | ORAL | @ 13:00:00 | NDC 00904759161

## 2020-11-29 MED ADMIN — CEFAZOLIN 1 GRAM IJ SOLR [1445]: 500 mL | @ 22:00:00 | Stop: 2020-11-29 | NDC 00143992490

## 2020-11-29 MED ADMIN — BUMETANIDE 0.25 MG/ML IJ SOLN [9308]: 1 mg/h | INTRAVENOUS | @ 11:00:00 | Stop: 2020-11-29 | NDC 00641600701

## 2020-11-30 ENCOUNTER — Encounter: Admit: 2020-11-30 | Discharge: 2020-11-30 | Payer: MEDICARE

## 2020-11-30 ENCOUNTER — Inpatient Hospital Stay: Admit: 2020-11-30 | Discharge: 2020-11-30 | Payer: MEDICARE

## 2020-11-30 MED ADMIN — ROSUVASTATIN 5 MG PO TAB [88502]: 5 mg | ORAL | @ 13:00:00 | Stop: 2020-11-30 | NDC 68462026190

## 2020-11-30 MED ADMIN — ACETAMINOPHEN 325 MG PO TAB [101]: 650 mg | ORAL | @ 12:00:00 | Stop: 2020-11-30 | NDC 00904677361

## 2020-11-30 MED ADMIN — ASCORBIC ACID (VITAMIN C) 500 MG PO TAB [664]: 500 mg | ORAL | @ 13:00:00 | Stop: 2020-11-30 | NDC 00904052361

## 2020-11-30 MED ADMIN — FERROUS SULFATE 325 MG (65 MG IRON) PO TAB [3074]: 325 mg | ORAL | @ 13:00:00 | Stop: 2020-11-30 | NDC 00904759161

## 2020-11-30 MED ADMIN — PANTOPRAZOLE 40 MG IV SOLR [78621]: 40 mg | INTRAVENOUS | @ 13:00:00 | Stop: 2020-11-30 | NDC 55150020200

## 2020-11-30 MED ADMIN — BUMETANIDE 1 MG PO TAB [9310]: 5 mg | ORAL | @ 13:00:00 | Stop: 2020-11-30 | NDC 00904701606

## 2020-11-30 MED ADMIN — ACETAMINOPHEN 325 MG PO TAB [101]: 650 mg | ORAL | @ 18:00:00 | Stop: 2020-11-30 | NDC 00904677361

## 2020-11-30 MED ADMIN — POTASSIUM CHLORIDE 10 MEQ PO TBTQ [35942]: 10 meq | ORAL | @ 18:00:00 | Stop: 2020-11-30 | NDC 00245531789

## 2020-11-30 MED FILL — OMEPRAZOLE 20 MG PO CPDR: 20 mg | ORAL | 30 days supply | Qty: 30 | Fill #1 | Status: CP

## 2020-12-03 ENCOUNTER — Encounter: Admit: 2020-12-03 | Discharge: 2020-12-03 | Payer: MEDICARE

## 2020-12-03 NOTE — Telephone Encounter
New patient was enrolled into [employer] remote monitoring. New patient welcome letter and agreement were sent. Remote monitoring schedule was entered into patient chart for future device checks. Next remote device check is scheduled for 03/01/21.BC

## 2020-12-04 ENCOUNTER — Encounter: Admit: 2020-12-04 | Discharge: 2020-12-04 | Payer: MEDICARE

## 2020-12-04 NOTE — Telephone Encounter
PC to pt for symptom check as PAD's above thresholds. Pt reports his weight is up, SOA, DOE and his legs are more swollen then they have been in 3 months.  He states that when he was an inpatient he got really swollen and has gained 1 lb since returning home. He reports that he took metolazone 2.5mg  yesterday but did not have much increase in his urine output, He is wondering if he should take metolazone 2.5mg  tomorrow instead of the 1.25mg  he is scheduled to take. Pt taking all other meds as listed on MAR. He was told to have lab work done this week wondering if she should have it done at [facility] or Friday at [facility]?  Informed pt I would discuss with [name], APRN and return his call.       SOA with activity: Yes, more today then yesterday  SOA at rest: No  Edema: Yes My legs are more swollen then they have been in 3 months   Cough:No  Chest pain: No  Palpitations/heart racing: No  Orthopnea: No  PND: No  Weight gain: Yes  Dizziness/Ligheadedness: No  Other: Yes  Recent Fevers: No       Blood Pressure Weight  (Lbs)   12/04/2020 125/62 158   12/03/2020 129/67 158   12/02/2020 128/56 158   12/01/2020 142/60 157   11/30/2020  157       DAILY CardioMEMS READINGS    Goal PA Diastolic: 22 mmHg  PA Diastolic Thresholds: 20-24 mmHg        CARDIAC MEDS:    Home Medications    Medication Sig   amiodarone (CORDARONE) 200 mg tablet Take one tablet by mouth daily.   aspirin 81 mg chewable tablet Chew one tablet by mouth daily. Take with food.   bumetanide (BUMEX) 2 mg tablet Take 2.5 tablets by mouth twice daily. (Hold Bumex on 11/13/20, 11/14/20, and 11/15/20)   empagliflozin (JARDIANCE) 10 mg tablet Take one tablet by mouth daily.   magnesium oxide (MAG-OX) 400 mg (241.3 mg magnesium) tablet Take one tablet by mouth daily.   metOLazone (ZAROXOLYN) 2.5 mg tablet Take Metolazone 2.5mg  (1 tablet) on Mondays and Fridays; take Metolazone 1.25mg  (1/2 of a tablet) on Wednesdays.   potassium chloride SR (KLOR-CON M20) 20 mEq tablet Take three tablets by mouth three times daily. Take additional 20 mEq on Metolazone days Mon-Wed-Fri.   rivaroxaban (XARELTO) 15 mg tablet Take one tablet by mouth daily with breakfast. Take with food.   rosuvastatin (CRESTOR) 5 mg tablet Take one tablet by mouth three times weekly.   tafamidis (VYNDAMAX) 61 mg capsule Take one capsule by mouth daily.       RECENT LABS:    Basic Metabolic Profile    Lab Results   Component Value Date/Time    NA 138 11/30/2020 07:43 AM    K 3.8 11/30/2020 07:43 AM    CA 8.7 11/30/2020 07:43 AM    CL 98 11/30/2020 07:43 AM    CO2 31 (H) 11/30/2020 07:43 AM    GAP 9 11/30/2020 07:43 AM    Lab Results   Component Value Date/Time    BUN 51 (H) 11/30/2020 07:43 AM    CR 2.16 (H) 11/30/2020 07:43 AM    GLU 114 (H) 11/30/2020 07:43 AM          UPCOMING APPOINTMENTS:    Future Appointments   Date Time Provider Department Center   12/07/2020  2:45 PM [place] [facility] PROC/RESEARCH [id number] CVM  Procedur   12/07/2020  4:00 PM McClymont, Harriet Masson, APRN-NP [facility] CVM Exam   12/12/2020  3:15 PM Vanetta Shawl I, MD CV[facility]CL CVM Exam   12/13/2020  1:00 PM [name], MD [facility] IM

## 2020-12-04 NOTE — Telephone Encounter
Ray Running, APRN-NP  [name]; P Cvm Nurse Hf Team [place]  Caller: Unspecified (Today, 2:33 PM)  Sorry, just got done with clinic., I had asked for a post hospital follow up, but wasn't scheduled. Please schedule him with me on 9/8.      If he is on bumex 3 mg BID, please increase to 4 mg BID. Yes, he can get labs drawn at [facility] tomorrow.

## 2020-12-04 NOTE — Telephone Encounter
Called and spoke with [name]. Since we haven't heard from [place] with instructions yet, asked him to wait to take his diuretics. Manuela Schwartz will contact him in the morning with follow up instructions. He verbalized understanding and will wait.

## 2020-12-04 NOTE — Progress Notes
Today he reports that on discharge his weight was 155 pounds, and today it was 158 at home, the highest it has ever been.  I told him to increase the bumex to 4 mg BID and take 2.5 of metolazone.  He will need labs tomorrow at [facility], and I offered him an appointment with me on Thursday, he will see how he feels tomorrow after his metolazone.  He is taking kdur 60 meq TID.  DM

## 2020-12-05 ENCOUNTER — Encounter: Admit: 2020-12-05 | Discharge: 2020-12-05 | Payer: MEDICARE

## 2020-12-05 DIAGNOSIS — J45909 Unspecified asthma, uncomplicated: Secondary | ICD-10-CM

## 2020-12-05 DIAGNOSIS — Z8679 Personal history of other diseases of the circulatory system: Secondary | ICD-10-CM

## 2020-12-05 DIAGNOSIS — I358 Other nonrheumatic aortic valve disorders: Secondary | ICD-10-CM

## 2020-12-05 DIAGNOSIS — I361 Nonrheumatic tricuspid (valve) insufficiency: Secondary | ICD-10-CM

## 2020-12-05 DIAGNOSIS — J302 Other seasonal allergic rhinitis: Secondary | ICD-10-CM

## 2020-12-05 DIAGNOSIS — I10 Essential (primary) hypertension: Secondary | ICD-10-CM

## 2020-12-05 DIAGNOSIS — C4490 Unspecified malignant neoplasm of skin, unspecified: Secondary | ICD-10-CM

## 2020-12-05 DIAGNOSIS — N189 Chronic kidney disease, unspecified: Secondary | ICD-10-CM

## 2020-12-05 DIAGNOSIS — E859 Amyloidosis, unspecified: Secondary | ICD-10-CM

## 2020-12-05 DIAGNOSIS — I350 Nonrheumatic aortic (valve) stenosis: Secondary | ICD-10-CM

## 2020-12-05 DIAGNOSIS — I340 Nonrheumatic mitral (valve) insufficiency: Secondary | ICD-10-CM

## 2020-12-05 DIAGNOSIS — E785 Hyperlipidemia, unspecified: Secondary | ICD-10-CM

## 2020-12-06 ENCOUNTER — Encounter: Admit: 2020-12-06 | Discharge: 2020-12-06 | Payer: MEDICARE

## 2020-12-06 DIAGNOSIS — K921 Melena: Secondary | ICD-10-CM

## 2020-12-06 LAB — CBC
HEMATOCRIT: 31 — ABNORMAL LOW (ref 42–52)
HEMOGLOBIN: 9.2 — ABNORMAL LOW (ref 14–18)
MCH: 31 — ABNORMAL HIGH (ref 27–31)
MCHC: 29 — ABNORMAL LOW (ref 30–34)
MCV: 107 — ABNORMAL HIGH (ref 80–99)
PLATELET COUNT: 184 (ref 130–400)
RBC COUNT: 2.9 — ABNORMAL LOW (ref 4.7–6.1)
RDW: 16 — ABNORMAL HIGH (ref 11.5–14.5)
WBC COUNT: 6.4 (ref 4.8–10.8)

## 2020-12-06 LAB — COMPREHENSIVE METABOLIC PANEL
ALBUMIN: 3.2 — ABNORMAL HIGH (ref 3.4–4.8)
ALK PHOSPHATASE: 106 (ref 40–150)
CALCIUM: 9.5 (ref 8.8–10)
SODIUM: 136 (ref 136–145)
TOTAL BILIRUBIN: 0.7 (ref 0.2–1.2)
TOTAL PROTEIN: 6.9 (ref 6.2–8.1)

## 2020-12-07 ENCOUNTER — Ambulatory Visit: Admit: 2020-12-07 | Discharge: 2020-12-07 | Payer: MEDICARE

## 2020-12-07 ENCOUNTER — Encounter: Admit: 2020-12-07 | Discharge: 2020-12-07 | Payer: MEDICARE

## 2020-12-07 DIAGNOSIS — I350 Nonrheumatic aortic (valve) stenosis: Secondary | ICD-10-CM

## 2020-12-07 DIAGNOSIS — I10 Essential (primary) hypertension: Secondary | ICD-10-CM

## 2020-12-07 DIAGNOSIS — J302 Other seasonal allergic rhinitis: Secondary | ICD-10-CM

## 2020-12-07 DIAGNOSIS — D509 Iron deficiency anemia, unspecified: Secondary | ICD-10-CM

## 2020-12-07 DIAGNOSIS — E859 Amyloidosis, unspecified: Secondary | ICD-10-CM

## 2020-12-07 DIAGNOSIS — I5033 Acute on chronic diastolic (congestive) heart failure: Secondary | ICD-10-CM

## 2020-12-07 DIAGNOSIS — E785 Hyperlipidemia, unspecified: Secondary | ICD-10-CM

## 2020-12-07 DIAGNOSIS — I361 Nonrheumatic tricuspid (valve) insufficiency: Secondary | ICD-10-CM

## 2020-12-07 DIAGNOSIS — Z952 Presence of prosthetic heart valve: Secondary | ICD-10-CM

## 2020-12-07 DIAGNOSIS — C4490 Unspecified malignant neoplasm of skin, unspecified: Secondary | ICD-10-CM

## 2020-12-07 DIAGNOSIS — R0989 Other specified symptoms and signs involving the circulatory and respiratory systems: Secondary | ICD-10-CM

## 2020-12-07 DIAGNOSIS — N189 Chronic kidney disease, unspecified: Secondary | ICD-10-CM

## 2020-12-07 DIAGNOSIS — R001 Bradycardia, unspecified: Secondary | ICD-10-CM

## 2020-12-07 DIAGNOSIS — I358 Other nonrheumatic aortic valve disorders: Secondary | ICD-10-CM

## 2020-12-07 DIAGNOSIS — J45909 Unspecified asthma, uncomplicated: Secondary | ICD-10-CM

## 2020-12-07 DIAGNOSIS — N184 Chronic kidney disease, stage 4 (severe): Secondary | ICD-10-CM

## 2020-12-07 DIAGNOSIS — I340 Nonrheumatic mitral (valve) insufficiency: Secondary | ICD-10-CM

## 2020-12-07 DIAGNOSIS — Z8679 Personal history of other diseases of the circulatory system: Secondary | ICD-10-CM

## 2020-12-07 NOTE — Progress Notes
Daily CardioMEMS Readings    Goal PA Diastolic: 22 mmHg   PA Diastolic Thresholds: 123456 mmHg

## 2020-12-07 NOTE — Progress Notes
ThanDate of Service: 12/07/2020    Ray [name] is a 85 y.o. male.       HPI      I had the pleasure of seeing Ray [name], who is accompanied by his wife Ray [name] today in [place] cardiovascular medicine clinic for posthospital follow-up.       He was recently hospitalized at [facility] from August 29 through September 2 for junctional bradycardia.  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     He presented with decompensated HFpEF, acute blood loss anemia, high degree heart block and AKI/CKD4.He initially had complete heart block and EP was consulted.  He underwent PPM placement with EP on 9/1.  While in the ICU he was diuresed with IV Bumex 2 mg twice daily with going to floor we resumed his PTA Bumex.He also had significant anemia on admission but was much lower than his baseline.  GI was consulted and he underwent EGD and colonoscopy.  Colonoscopy showed one 4 mm polyp which was not resected.  This is likely the cause of his anemia.  Nonbleeding external hemorrhoids were seen.EGD showed small hiatal hernia and erythematous polyps in the gastric body.  There was some gastritis in the posterior wall of the stomach which was biopsied.  Video capsule endoscopy was placed.      Today he reports that he has had swelling in his legs, home weight was 158 pounds on Wednesday, dry weight has been previously 145 to 148 pounds.  He continues to feel fatigued however his breathing is good.  He denies any chest pain or pressure, denies awareness of palpitations.  He has had a good appetite.  He does notice some increased bruising on his knees and right upper shoulder.             Vitals:    12/07/20 1411   BP: 126/54   BP Source: Arm, Left Upper   Pulse: 77   SpO2: 99%   O2 Device: None (Room air)   PainSc: Zero   Weight: 70.8 kg (156 lb)   Height: 167.6 cm (5' 6)     Body mass index is 25.18 kg/m?[name]     Past Medical History  Patient Active Problem List    Diagnosis Date Noted   ? 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  ?  01/26/2019 Echo EF:?60%  IVS 1.50 cm (Range: 0.6 - 1.0)         LV PW 1.40 cm (Range: 0.6 - 1.0)         ?  1. Moderate concentric left ventricular hypertrophy  2. Unable to assess diastolic function due to atrial fibrillation  3. Moderate biatrial dilatation  4. Normal right ventricular size and systolic function  5. Normal central venous pressure  6. Mild calcific aortic valve stenosis. ?Mean gradient is 16 mmHg with a peak velocity of 2.8 m/s. ?Trace aortic valve regurgitation is seen  7. Mitral valve is structurally okay. ?There is moderate central regurgitation. ?No stenosis  8. Moderate tricuspid valve regurgitation  9. Pulmonary artery static pressure measures at 33 mmHg   06/28/2019 TC PYP Multiview planar views and SPECT imaging confirms the presence of severe marked diffuse global left ventricular  and right ventricular myocardial uptake of tracer. ?There is normal rib and bone uptake.  ?  Two hour heart/contralateral lung (H/CL) ratio: ?1.977?this metric is abnormal and highly suggestive of ATTR cardiac amyloidosis.?  ?  Visual Semi-Quantitative Grading Scale Analysis:?  The study is grade 3 indicating myocardial uptake greater than rib and bone uptake. ?The pattern is highly and strongly suggestive of ATTR cardiac amyloidosis.  ?  SUMMARY/OPINION:   This study is abnormal and strongly suggestive of ATTR cardiac amyloidosis. ?Left ventricular myocardial uptake is diffuse, intense, and global. ?There is also prominent right ventricular free wall uptake of tracer. ?The pattern is typical for ATTR cardiac amyloidosis   ? Cardiac MRI ?   07/25/2019 Kappa/Lambda FLC ?  ? Ref. Range 07/25/2019 15:48   Kappa, FLC Latest Ref Range: 0.33 - 1.94 MG/DL 1.61 (H)   Lambda, FLC Latest Ref Range: 0.57 - 2.63 MG/DL 0.96 (H)   Kappa/Lambda FLC Latest Ref Range: 0.26 - 1.65  1.83 (H)      07/25/2019 Serum Immunofixation ?NO PARAPROTEIN SEEN   cancelled Urine Immunofixation ?Patient unable to provide urine sample 4/26    07/25/2019 Genetic Testing ?Negative   ? Biopsy (Fat Pad, Cardiac, Bone Marrow) ?   ? GI Symptoms ?   ?+? 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   ?  ? Ref. Range 07/25/2019 15:48   B Type Natriuretic Peptide Latest Ref Range: 0 - 100 PG/ML 966.0 (H)   Troponin-I Latest Ref Range: 0.0 - 0.05 NG/ML 0.05   ?  Completed:  -Tafamidis start 08/2019. PA approved, pt approved for [employer] patient assistance from 09/01/2019 to 03/30/2020  -Baseline KCCQ12 completed 5/18/202, Summary Score 61.46  -Baseline 6 min walk completed 08/16/2019, distance walked 950 feet, 289.75 meters, duration of test 6 minutes  -Baseline CPET completed 10/17/2019       ? Iron deficiency anemia 11/27/2020   ? Heart failure (HCC) 11/26/2020   ? AKI (acute kidney injury) (HCC) 11/26/2020   ? S/P TAVR (transcatheter aortic valve replacement) 10/25/2020   ? Hypokalemia 10/25/2020   ? Mitral regurgitation 09/06/2020   ? Nonrheumatic tricuspid valve regurgitation 09/06/2020   ? Nonrheumatic aortic valve stenosis 09/06/2020   ? Asymptomatic microscopic hematuria 12/25/2019   ? S/P left pulmonary artery pressure sensor implant placement 12/15/2019     *Patient has CardioMEMS (Pulmonary Artery Pressure Sensor)* Please call [name], CardioMEMs Program Coordinator [phone] or page Heart Failure Rounding Team if patient is admitted or presents to ED*          ? Chronic heart failure with preserved ejection fraction (HCC) 12/15/2019   ? Acute on chronic diastolic CHF (congestive heart failure), NYHA class 2 (HCC) 11/14/2019   ? S/P ablation of atrial fibrillation 05/18/2018   ? Lightheadedness 03/16/2018   ? Hospitalization within last 30 days 12/24/2017   ? Atrial fibrillation with RVR (HCC) 12/14/2017   ? Chronic kidney disease, stage 4 (severe) (HCC) 12/13/2017   ? Junctional bradycardia 12/12/2017   ? Bilateral leg edema 12/11/2017   ? On amiodarone therapy 12/11/2017   ? Heart failure with preserved ejection fraction (HCC) 01/01/2016   ? NSVT (nonsustained ventricular tachycardia) (HCC) 06/12/2015   ? Medication side effects 11/09/2014   ? Heart palpitations 09/19/2014   ? Longstanding persistent atrial fibrillation (HCC) 03/17/2013  03/26/2018 - ECHO:  Moderate concentric LVH seen. The EF is about 55%.  The atria are slightly dilated.  The RV function is mildly decreased.  Mild to moderate MR and TR seen.  Mild AI is present.  Based on the gradients and appearance, the AS appears to be mild. The mean gradient is 16 mm Hg with a peak velocity of 2.6 m/s.  03/26/2018 - Zio Patch:  Predominant rhythm was normal sinus.  Two runs of ventricular tachycardia occurred, they were monomorphic, the longest run lasted 3 seconds, this episode occurred on April 08, 2018 at 9:41 PM.  One episode of AIVR also appeared to be present, it lasted approximately 4.6 seconds  Brief and rare episodes of SVT that probably represent atrial fibrillation, the longest lasting 6.3 seconds.  No evidence of atrial flutter and no evidence of high degree AV block.  05/12/2018 - LAAA:  Persistent Atrial Fibrillation status post successful pulmonary vein antral isolation.  Cavotricuspid Isthmus Ablation.  Ablation of Fractionated Intracardiac Electrograms.  Ablation for an Additional Discrete Arrhythmia-LA AFL after AFIB/PVI Ablation  06/23/2018 - Cardioversion:  Successful direct current cardioversion from symptomatic atrial fibrillation to sinus rhythm.  03/16/2019 - DCCV:  Successful direct current cardioversion from atrial fibrillation to sinus rhythm.     ? Systolic murmur of aorta 02/04/2013   ? SOB (shortness of breath) 02/04/2013   ? Carotid bruit present 01/23/2012   ? Overweight (BMI 25.0-29.9) 01/30/2011   ? Essential hypertension 01/30/2009   ? Hyperlipidemia 01/30/2009     Hyperlipidemia, on statin therapy     ? Skin cancer 01/30/2009   ? Seasonal allergic reaction 01/30/2009         Review of Systems   Constitutional: Negative.   HENT: Negative.    Eyes: Negative.    Cardiovascular: Negative.    Respiratory: Negative.    Endocrine: Negative.    Hematologic/Lymphatic: Negative.    Skin: Negative.    Musculoskeletal: Negative.    Gastrointestinal: Positive for constipation.   Genitourinary: Negative.    Neurological: Negative.    Psychiatric/Behavioral: Negative.    Allergic/Immunologic: Negative.        Physical Exam  General Appearance: In NAD.   Neck Veins: 7 cm JVP, neck veins are distended, + HJR   Chest Inspection: Left subclavian incision clean, dry and intact  Respiratory Effort: breathing comfortably, no respiratory distress   Auscultation/Percussion: lungs clear to auscultation, no rales, rhonchi or wheezing   Cardiac Rhythm: irregularly irregular rhythm and normal rate   Cardiac Auscultation: S1, S2 normal, no rub, no definite S3  or S4   Murmurs: grade iii/vi systolic ejection murmur at left mid axillary line  Peripheral Circulation: normal peripheral circulation   Pedal Pulses: normal symmetric pedal pulses   Lower Extremity Edema: +1 mm bilateral peripheral edema at the shins.  Venous discoloration. .  Abdominal Exam: soft non-tender, no obvious masses, bowel sounds normal   Gait & Station: walks without assistance   Orientation: oriented to person, place and time   Affect & Mood: appropriate and sustained affect   Language and Memory: patient responsive and seems to comprehend information   Neurologic Exam: neurological assessment grossly intact   Vital signs were reviewed      Cardiovascular Studies:    Echo 10/25/2020:Interpretation Summary   ? The left ventricle appears small. Moderate concentric hypertrophy. The visually estimated ejection fraction is 60%. The ejection fraction by Simpson's biplane method is 57%. There are no segmental wall motion abnormalities. Cannot  determine left ventricular diastolic function.  ? Right Ventricle: The right ventricle is mildly dilated. The right ventricular systolic function is at the lower limit of normal. PASP is estimated at 32 mmHg.  ? Left Atrium: Severely dilated. Right Atrium: Severely dilated.  ? Mitral Valve: Normal valve structure. No stenosis. Severe regurgitation. The jet is centrally directed.  ? Tricuspid Valve: Normal valve structure. No stenosis. Moderate to severe regurgitation.  ? Aortic Valve: There is a 35 mm S3 bioprosthetic valve present. The prosthetic valve is normal. No stenosis. No regurgitation.  ? The aortic root and ascending aorta are normal in size.  ? No pericardial effusion.    CHEST: 09/28/2020  1. Thickening and calcification of the aortic valve with a normal caliber   ascending thoracic aorta.   2. Development of several sub-5 mm nodules scattered throughout both   lungs. These are nonspecific though are most likely infectious or   inflammatory. In a low-risk patient no follow-up needed. In a high-risk   patient follow-up chest CT at 12 months is suggested. ?   3. Cardiomegaly and coronary artery disease   4. Pulmonary hypertension     ABDOMEN AND PELVIS:   1. Normal caliber abdominal aorta and common iliac arteries with moderate   diffuse atherosclerosis.   2. Moderate to severe focal stenosis at the origin of the SMA.   3. Moderate distal colonic diverticulosis. ?     6 Minute Walk Last Results 06/26/2020 08/16/2019   SpO2:  Pre-Activity 99 99   Pulse:  POST-Activity 94 122   BP:  POST-Activity 126/76 151/76   SpO2:  POST-Activity 99 96   Distance Walked in Meters 152.5 289.75       Problems Addressed Today  Encounter Diagnoses   Name Primary?   ? Aortic stenosis, mild Yes       Assessment and Plan   Wild-type ATTR amyloidosis:   -He has had a previous technetium pyrophosphate scan on 06/28/2019 that showed a 2-hour heart/contralateral lung ratio of 1.97 with visual semiquantitative grading grade 3, abnormal and highly suggestive of ATTR cardiac amyloidosis.   - He has had kappa 7.45, lambda 4.07 and free light chain ratio 1.83 on 07/25/2019  -He had serum electrophoresis that showed no paraprotein.   -See 6-minute walk noted above  -He has completed a KCC Q score that was 72.92 indicative of fair to good quality of life.    -He is on tafamidis 61 mg daily.    -He had a baseline CPET on 7/19 2021, will repeat this after he is recovered from his TAVR.  -He had repeat TC PYP on 06/11/2020, it showed 2-hour heart/contralateral lung ratio reduced to 1.7, visual semiquantitative grading scale remains at 3.  -Follow-up appointment with Dr. [name] on 9/15.  Of note Dr. [name] had talked to him about potentially starting Onpattro for open label extension of the Apollo B trial.       Heart failure with preserved ejection fraction:  - He appears hypervolemic on physical exam.  He is currently taking Bumex 6 mg twice daily and metolazone as instructed (currently 2.5 mg on Mondays and Friday and 1.25 on Wednesday.  He is also on Jardiance 10 mg daily, unable to use spironolactone due to low EGFR.  His CardioMEMS reading today was 2 7, threshold is 20-24,  CardioMEMS in situ: -12/15/19 IMPLANT RHC:?  BP:?126/76 (99)  RA:?27  RV:?60/32  PA:?57/31 (42)  PCWP:?30  TPG:?12  PVR:?3.8  SVR:?1840.  CO (Fick):?3.1  CI (Fick):?1.7?  AVO2 Diff 7.18?  Daily CardioMEMS Readings  ?Goal PA Diastolic:  22  PA Diastolic Thresholds:  20-24    If his mems is still elevated on Tuesday I will have him take metolazone 2.5 mg next Wednesday.  His albumin level has dropped, it is 3.2, he states that when he was in the hospital he only ate about 4 meals.  I have encouraged him to increase protein in his diet.    Hypokalemia: Potassium on 9/7 was 3.1, he currently takes 60 mill equivalents of potassium 3 times daily with additional 20 M EQ on Mondays, Wednesdays and Fridays.  We will have him take an extra 24 M EQ today.    Severe aortic stenosis status post TAVR with Dr. Paris Lore in [place] on 7/27:   - He had CT chest and abdomen with contrast on 7/1 that showed thickening and calcification of the aortic valve with a normal caliber ascending thoracic aorta, several sub-5 mm nodules scattered throughout both lungs, cardiomegaly, coronary artery disease and pulmonary hypertension.    -He had postprocedure echo on 7/28 that showed prosthetic valve was normal, no stenosis or regurgitation.  -  He is scheduled for follow-up with Janelle McClymont this afternoon with repeat echo.     Severe mitral regurgitation that is noted on most recent echo, in the future he may need a MitraClip.    CKD stage III: Last chemistry on 9/7 showed creatinine 2.71 with BUN of 68, he had taken 2.5 mg of metolazone that day.  Of note that his lowest creatinine was 2.16 on day of discharge.       Atrial fibrillation: He continues to take amiodarone 200 mg p.o.daily, he is on oral anticoagulation with Xarelto 15 mg daily, he denies any upper or lower GI bleeding symptoms.  He has been in atrial fibrillation since at least 2020, has had previous ablations.  In addition he takes aspirin 81 mg daily per TAVR protocol.     Permanent pacemaker in situ: He underwent permanent pacemaker insertion with Dr. Arsenio Loader on 9/1, final device evaluation on 9/2 showed normal function.  He had postoperative chest x-ray on 9/2 that showed small pleural effusions and improved edema, interval placement of dual-chamber pacemaker with leads terminating in the right atrium and right ventricle.        I have personally documented the HPI, exam and medical decision making.  Patient education: I reviewed recent lab results and current medications, medication instructions, discussed heart failure signs & symptoms,  low  sodium diet, fluid restriction and daily weights.I have instructed the patient on the plan of care and they verbalize understanding of the plan. Please see AVS for full patient teaching. Patient advised to call our office if s/he has any problems, questions, worsening symptoms, or concerns prior to the next appointment.   Thanks for allowing me to see this nice patient. If I can be of additional assistance, please don't hesitate to contact me.     [name] AGPCNP-BC, [facility]  Pager [phone]  Collaborating physician: Dr. Vanetta Shawl    Total Time Today was 40 minutes in the following activities: Preparing to see the patient, Obtaining and/or reviewing separately obtained history, Performing a medically appropriate examination and/or evaluation, Counseling and educating the patient/family/caregiver, Ordering medications, tests, or procedures, Referring and communication with other health care professionals (when not separately reported), Documenting clinical information in the electronic or other health record, Independently interpreting results (not separately reported) and communicating results to  the patient/family/caregiver and Care coordination (not separately reported)           No diagnosis found.                      Current Medications (including today's revisions)  ? acetaminophen (TYLENOL) 325 mg tablet Take two tablets by mouth every 6 hours as needed.   ? allopurinoL (ZYLOPRIM) 300 mg tablet Take 300 mg by mouth daily. Take with food.   ? amiodarone (CORDARONE) 200 mg tablet Take one tablet by mouth daily.   ? aspirin 81 mg chewable tablet Chew one tablet by mouth daily. Take with food.   ? bumetanide (BUMEX) 2 mg tablet Take three tablets by mouth twice daily.   ? cholecalciferol (vitamin D3) (VITAMIN D3) 1,000 units tablet Take 1,000 Units by mouth daily.   ? docusate (COLACE) 100 mg capsule Take 100 mg by mouth twice daily.   ? empagliflozin (JARDIANCE) 10 mg tablet Take one tablet by mouth daily.   ? ferrous sulfate (FEOSOL) 325 mg (65 mg iron) tablet Take 325 mg by mouth twice daily. Take on an empty stomach at least 1 hour before or 2 hours after food.    ? fluticasone/salmeterol (ADVAIR) 100/50 mcg inhalation disk Inhale 1 puff by mouth into the lungs daily.   ? lactobacillus rhamnosus (GG) (CULTURELLE) 10 billion cell cap Take 1 capsule by mouth daily with breakfast.   ? magnesium oxide (MAG-OX) 400 mg (241.3 mg magnesium) tablet Take one tablet by mouth daily.   ? metOLazone (ZAROXOLYN) 2.5 mg tablet Take Metolazone 2.5mg  (1 tablet) on Mondays and Fridays; take Metolazone 1.25mg  (1/2 of a tablet) on Wednesdays.   ? omeprazole DR (PRILOSEC) 20 mg capsule Take one capsule by mouth daily before breakfast.   ? polyethylene glycol 3350 (MIRALAX) 17 g packet Take one packet by mouth as Needed.   ? potassium chloride SR (KLOR-CON M20) 20 mEq tablet Take three tablets by mouth three times daily. Take additional 20 mEq on Metolazone days Mon-Wed-Fri.   ? rivaroxaban (XARELTO) 15 mg tablet Take one tablet by mouth daily with breakfast. Take with food.   ? rosuvastatin (CRESTOR) 5 mg tablet Take one tablet by mouth three times weekly.   ? senna (SENOKOT) 8.6 mg tablet Take 2 tablets by mouth twice daily as needed for Constipation.   ? sildenafiL (VIAGRA) 100 mg tablet TAKE 1 TABLET BY MOUTH ONCE DAILY AS NEEDED FOR SEXUAL ACTIVITY   ? tafamidis (VYNDAMAX) 61 mg capsule Take one capsule by mouth daily.   ? VENTOLIN HFA 90 mcg/actuation inhaler Inhale 1 puff by mouth into the lungs as Needed.

## 2020-12-07 NOTE — Progress Notes
Date of Service: 12/07/2020    Ray [name] is a 85 y.o. male.       HPI     I had the pleasure of seeing Ray [name] for 1 month follow up after recent TAVR.  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 fib ablation. ?He developed progressive aortic stenosis and was referred to valve clinic for TAVR.  Cardiac cath prior to TAVR revealed mild nonobstructive coronary artery disease.  ?  He was admitted on 10/24/2020 and underwent successful TAVR with a 26 mm S3 bioprosthetic valve with Dr. Helen Hashimoto and Dr. Paris Lore.  He tolerated the procedure well without complications.  He did have intermittent runs of atrial fibrillation and PTA Xarelto was resumed on postop day #1.  A postoperative echocardiogram demonstrated a 26 mm S3 bioprosthetic valve without stenosis or regurgitation.  He was discharged to home on postoperative day #1.    I saw him on 8/3 and initially he had significant symptomatic improvement however he was admitted to the hospital from 8 29-9 2 for junctional bradycardia as well as decompensated heart failure and acute blood loss anemia.  He was found to have high-grade heart block and AKI/CKD stage IV.  EP was consulted and he underwent pacemaker implant on 9/1.  He required IV diuresis in the ICU and was noted to have significant anemia on admission much lower than his baseline.  GI was consulted and he underwent EGD and colonoscopy.  Colonoscopy showed one 4 mm polyp which was not resected. ?This is likely the cause of his anemia. ?Nonbleeding external hemorrhoids were seen.EGD showed small hiatal hernia and erythematous polyps in the gastric body. ?There was some gastritis in the posterior wall of the stomach which was biopsied. ?Video capsule endoscopy was placed.  He did receive 1 unit of blood during his admission.  He follows closely with the heart failure team and was seen by [name] earlier today.  Recently he has had an 8 to 10 pound weight gain and increasing lower extremity edema and they have been titrating his diuretics as well as metolazone.    Today he reports shortness of breath noted with exertion however feels it has been fairly stable.  He notes increasing lower extremity edema and about an 8 pound weight gain.  He notes mild improvement today over yesterday and his swelling.  He denies orthopnea or PND.  He denies chest pain, palpitations, dizziness, lightheadedness, or near syncope.  He denies problems with his pacemaker insertion site.         Vitals:    12/07/20 1549   BP: 126/54   BP Source: Arm, Right Upper   Pulse: 70   PainSc: Zero   Weight: 70.8 kg (156 lb)   Height: 167.6 cm (5' 6)     Body mass index is 25.18 kg/m?[name]     Past Medical History  Patient Active Problem List    Diagnosis Date Noted   ? Iron deficiency anemia 11/27/2020   ? Heart failure (HCC) 11/26/2020   ? AKI (acute kidney injury) (HCC) 11/26/2020   ? S/P TAVR (transcatheter aortic valve replacement) 10/25/2020   ? Hypokalemia 10/25/2020   ? Mitral regurgitation 09/06/2020   ? Nonrheumatic tricuspid valve regurgitation 09/06/2020   ? Nonrheumatic aortic valve stenosis 09/06/2020   ?  Asymptomatic microscopic hematuria 12/25/2019   ? S/P left pulmonary artery pressure sensor implant placement 12/15/2019     *Patient has CardioMEMS (Pulmonary Artery Pressure Sensor)* Please call [name], CardioMEMs Program Coordinator [phone] or page Heart Failure Rounding Team if patient is admitted or presents to ED*          ? Chronic heart failure with preserved ejection fraction (HCC) 12/15/2019   ? Acute on chronic diastolic CHF (congestive heart failure), NYHA class 2 (HCC) 11/14/2019   ? 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  ?  01/26/2019 Echo EF:?60%  IVS 1.50 cm (Range: 0.6 - 1.0)         LV PW 1.40 cm (Range: 0.6 - 1.0)         ?  1. Moderate concentric left ventricular hypertrophy  2. Unable to assess diastolic function due to atrial fibrillation  3. Moderate biatrial dilatation  4. Normal right ventricular size and systolic function  5. Normal central venous pressure  6. Mild calcific aortic valve stenosis. ?Mean gradient is 16 mmHg with a peak velocity of 2.8 m/s. ?Trace aortic valve regurgitation is seen  7. Mitral valve is structurally okay. ?There is moderate central regurgitation. ?No stenosis  8. Moderate tricuspid valve regurgitation  9. Pulmonary artery static pressure measures at 33 mmHg   06/28/2019 TC PYP Multiview planar views and SPECT imaging confirms the presence of severe marked diffuse global left ventricular and right ventricular myocardial uptake of tracer. ?There is normal rib and bone uptake.  ?  Two hour heart/contralateral lung (H/CL) ratio: ?1.977?this metric is abnormal and highly suggestive of ATTR cardiac amyloidosis.?  ?  Visual Semi-Quantitative Grading Scale Analysis:?  The study is grade 3 indicating myocardial uptake greater than rib and bone uptake. ?The pattern is highly and strongly suggestive of ATTR cardiac amyloidosis.  ?  SUMMARY/OPINION:   This study is abnormal and strongly suggestive of ATTR cardiac amyloidosis. ?Left ventricular myocardial uptake is diffuse, intense, and global. ?There is also prominent right ventricular free wall uptake of tracer. ?The pattern is typical for ATTR cardiac amyloidosis   ? Cardiac MRI ?   07/25/2019 Kappa/Lambda FLC ?  ? Ref. Range 07/25/2019 15:48   Kappa, FLC Latest Ref Range: 0.33 - 1.94 MG/DL 1.47 (H)   Lambda, FLC Latest Ref Range: 0.57 - 2.63 MG/DL 8.29 (H)   Kappa/Lambda FLC Latest Ref Range: 0.26 - 1.65  1.83 (H)      07/25/2019 Serum Immunofixation ?NO PARAPROTEIN SEEN   cancelled Urine Immunofixation ?Patient unable to provide urine sample 4/26    07/25/2019 Genetic Testing ?Negative   ? Biopsy (Fat Pad, Cardiac, Bone Marrow) ?   ? GI Symptoms ?   ?+? 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   ?  ? Ref. Range 07/25/2019 15:48   B Type Natriuretic Peptide Latest Ref Range: 0 - 100 PG/ML 966.0 (H)   Troponin-I Latest Ref Range: 0.0 - 0.05 NG/ML 0.05   ?  Completed:  -Tafamidis start 08/2019. PA approved, pt approved for [facility] patient assistance from 09/01/2019 to 03/30/2020  -Baseline KCCQ12 completed 5/18/202, Summary Score 61.46  -Baseline 6 min walk completed 08/16/2019, distance walked 950 feet, 289.75 meters, duration of test 6 minutes  -Baseline CPET completed 10/17/2019       ?  S/P ablation of atrial fibrillation 05/18/2018   ? Lightheadedness 03/16/2018   ? Hospitalization within last 30 days 12/24/2017   ? Atrial fibrillation with RVR (HCC) 12/14/2017   ? Chronic kidney disease, stage 4 (severe) (HCC) 12/13/2017   ? Junctional bradycardia 12/12/2017   ? Bilateral leg edema 12/11/2017   ? On amiodarone therapy 12/11/2017   ? Heart failure with preserved ejection fraction (HCC) 01/01/2016   ? NSVT (nonsustained ventricular tachycardia) (HCC) 06/12/2015   ? Medication side effects 11/09/2014   ? Heart palpitations 09/19/2014   ? Longstanding persistent atrial fibrillation (HCC) 03/17/2013     03/26/2018 - ECHO:  Moderate concentric LVH seen. The EF is about 55%.  The atria are slightly dilated.  The RV function is mildly decreased.  Mild to moderate MR and TR seen.  Mild AI is present.  Based on the gradients and appearance, the AS appears to be mild. The mean gradient is 16 mm Hg with a peak velocity of 2.6 m/s.  03/26/2018 - Zio Patch:  Predominant rhythm was normal sinus.  Two runs of ventricular tachycardia occurred, they were monomorphic, the longest run lasted 3 seconds, this episode occurred on April 08, 2018 at 9:41 PM.  One episode of AIVR also appeared to be present, it lasted approximately 4.6 seconds  Brief and rare episodes of SVT that probably represent atrial fibrillation, the longest lasting 6.3 seconds.  No evidence of atrial flutter and no evidence of high degree AV block.  05/12/2018 - LAAA:  Persistent Atrial Fibrillation status post successful pulmonary vein antral isolation.  Cavotricuspid Isthmus Ablation.  Ablation of Fractionated Intracardiac Electrograms.  Ablation for an Additional Discrete Arrhythmia-LA AFL after AFIB/PVI Ablation  06/23/2018 - Cardioversion:  Successful direct current cardioversion from symptomatic atrial fibrillation to sinus rhythm.  03/16/2019 - DCCV:  Successful direct current cardioversion from atrial fibrillation to sinus rhythm.     ? Systolic murmur of aorta 02/04/2013   ? SOB (shortness of breath) 02/04/2013   ? Carotid bruit present 01/23/2012   ? Overweight (BMI 25.0-29.9) 01/30/2011   ? Essential hypertension 01/30/2009   ? Hyperlipidemia 01/30/2009     Hyperlipidemia, on statin therapy     ? Skin cancer 01/30/2009   ? Seasonal allergic reaction 01/30/2009         Review of Systems   Constitutional: Negative.   HENT: Negative.    Eyes: Negative.    Cardiovascular: Negative.    Respiratory: Negative.    Endocrine: Negative.    Hematologic/Lymphatic: Negative.    Skin: Negative.    Musculoskeletal: Negative.    Gastrointestinal: Negative.    Genitourinary: Negative.    Neurological: Negative.    Psychiatric/Behavioral: Negative.    All other systems reviewed and are negative.      Physical Exam  General Appearance: no acute distress  Skin: warm & intact  HEENT: unremarkable  Neck Veins: neck veins are flat & not distended  Chest Inspection: chest is normal in appearance, left PPM site well approximated without hematoma  Auscultation/Percussion: lungs clear to auscultation, no rales, rhonchi, or wheezing  Cardiac Rhythm: regular rhythm & normal rate  Cardiac Auscultation: Normal S1 & S2, no S3 or S4, no rub  Murmurs: 3/6 holosystolic murmur  Extremities: 2+ bilateral lower extremity edema, multiple bruises to bilateral upper and lower extremities  Abdominal Exam: soft, non-tender, no masses, bowel sounds normal  Liver & Spleen: no organomegaly  Neurologic Exam: oriented to time, place and person; no focal neurologic deficits  Psychiatric: Normal mood and affect.  Behavior is normal. Judgment and thought content normal.         Cardiovascular Studies  Preliminary EKG: A paced, heart rate 70    Cardiovascular Health Factors  Vitals BP Readings from Last 3 Encounters:   12/07/20 126/54   12/07/20 126/54   12/07/20 126/65     Wt Readings from Last 3 Encounters:   12/07/20 70.8 kg (156 lb)   12/07/20 70.8 kg (156 lb)   12/07/20 71.2 kg (157 lb)     BMI Readings from Last 3 Encounters:   12/07/20 25.18 kg/m?   12/07/20 25.18 kg/m?   12/07/20 25.34 kg/m?      Smoking Social History     Tobacco Use   Smoking Status Never Smoker   Smokeless Tobacco Never Used      Lipid Profile Cholesterol   Date Value Ref Range Status   09/17/2020 145 <200 MG/DL Final     HDL   Date Value Ref Range Status   09/17/2020 64 >40 MG/DL Final     LDL   Date Value Ref Range Status   09/17/2020 72 <100 mg/dL Final     Triglycerides   Date Value Ref Range Status   09/17/2020 87 <150 MG/DL Final      Blood Sugar Hemoglobin A1C   Date Value Ref Range Status   10/22/2020 6.4 (H) 4.0 - 6.0 % Final     Comment:     The ADA recommends that most patients with type 1 and type 2 diabetes maintain   an A1c level <7%.       Glucose   Date Value Ref Range Status   12/05/2020 156 (H) 70 - 105 Final   11/30/2020 114 (H) 70 - 100 MG/DL Final   16/12/9602 540 (H) 70 - 100 MG/DL Final     Glucose, POC   Date Value Ref Range Status   10/11/2020 148 (H) 70 - 100 MG/DL Final   98/01/9146 829 (H) 70 - 100 MG/DL Final   56/21/3086 578 (H) 70 - 100 MG/DL Final   46/96/2952 841 (H) 70 - 100 MG/DL Final          Problems Addressed Today  Encounter Diagnoses   Name Primary?   ? S/P TAVR (transcatheter aortic valve replacement) Yes   ? Cardiovascular symptoms    ? Nonrheumatic aortic valve stenosis    ? Nonrheumatic mitral valve regurgitation    ? Nonrheumatic tricuspid valve regurgitation    ? Essential hypertension    ? Acute on chronic diastolic CHF (congestive heart failure), NYHA class 2 (HCC)    ? Hyperlipidemia, unspecified hyperlipidemia type    ? Junctional bradycardia    ? Chronic kidney disease, stage 4 (severe) (HCC)    ? Iron deficiency anemia, unspecified iron deficiency anemia type        Assessment and Plan     Low-flow low gradient severe aortic stenosis  Severe mitral regurgitation  Tricuspid regurgitation  He underwent successful TAVR with a 26 mm SAPIEN S3 bioprosthetic valve.    He has evidence of moderate volume overload today on exam.  I suspect his mitral valve may be contributing to this.  We will need to follow him closely and consider MitraClip procedure in the future.  He has follow-up next week with Dr. [name].    Chronic diastolic heart failure  He has evidence of moderate volume overload today on exam.  He describes NYHA class III heart  failure symptoms.  [name] saw him previously today and has been titrating his diuretics.  ?  Junctional bradycardia  He was noted to have high-grade AV block and had permanent pacemaker implanted on 9/1.  His incision site today looks well approximated without signs of infection and no hematoma.    CKD stage IV  ?  Paroxysmal atrial fibrillation  He is on chronic anticoagulation with Xarelto.  ?  Wild-type ATTR amyloidosis  He is on treatment with tafamidis.  ?  I appreciate the opportunity to participate in his care.  ?  Dorena Cookey, APRN  Pager [phone]         Current Medications (including today's revisions)  ? acetaminophen (TYLENOL) 325 mg tablet Take two tablets by mouth every 6 hours as needed.   ? allopurinoL (ZYLOPRIM) 300 mg tablet Take 300 mg by mouth daily. Take with food.   ? amiodarone (CORDARONE) 200 mg tablet Take one tablet by mouth daily.   ? aspirin 81 mg chewable tablet Chew one tablet by mouth daily. Take with food.   ? bumetanide (BUMEX) 2 mg tablet Take three tablets by mouth twice daily.   ? cholecalciferol (vitamin D3) (VITAMIN D3) 1,000 units tablet Take 1,000 Units by mouth daily.   ? docusate (COLACE) 100 mg capsule Take 100 mg by mouth twice daily.   ? empagliflozin (JARDIANCE) 10 mg tablet Take one tablet by mouth daily.   ? ferrous sulfate (FEOSOL) 325 mg (65 mg iron) tablet Take 325 mg by mouth twice daily. Take on an empty stomach at least 1 hour before or 2 hours after food.    ? fluticasone/salmeterol (ADVAIR) 100/50 mcg inhalation disk Inhale 1 puff by mouth into the lungs daily.   ? lactobacillus rhamnosus (GG) (CULTURELLE) 10 billion cell cap Take 1 capsule by mouth daily with breakfast.   ? magnesium oxide (MAG-OX) 400 mg (241.3 mg magnesium) tablet Take one tablet by mouth daily.   ? metOLazone (ZAROXOLYN) 2.5 mg tablet Take Metolazone 2.5mg  (1 tablet) on Mondays and Fridays; take Metolazone 1.25mg  (1/2 of a tablet) on Wednesdays.   ? omeprazole DR (PRILOSEC) 20 mg capsule Take one capsule by mouth daily before breakfast.   ? polyethylene glycol 3350 (MIRALAX) 17 g packet Take one packet by mouth as Needed.   ? potassium chloride SR (KLOR-CON M20) 20 mEq tablet Take three tablets by mouth three times daily. Take additional 20 mEq on Metolazone days Mon-Wed-Fri.   ? rivaroxaban (XARELTO) 15 mg tablet Take one tablet by mouth daily with breakfast. Take with food.   ? rosuvastatin (CRESTOR) 5 mg tablet Take one tablet by mouth three times weekly.   ? senna (SENOKOT) 8.6 mg tablet Take 2 tablets by mouth twice daily as needed for Constipation.   ? sildenafiL (VIAGRA) 100 mg tablet TAKE 1 TABLET BY MOUTH ONCE DAILY AS NEEDED FOR SEXUAL ACTIVITY   ? tafamidis (VYNDAMAX) 61 mg capsule Take one capsule by mouth daily.   ? VENTOLIN HFA 90 mcg/actuation inhaler Inhale 1 puff by mouth into the lungs as Needed.

## 2020-12-08 ENCOUNTER — Encounter: Admit: 2020-12-08 | Discharge: 2020-12-08 | Payer: MEDICARE

## 2020-12-10 ENCOUNTER — Encounter: Admit: 2020-12-10 | Discharge: 2020-12-10 | Payer: MEDICARE

## 2020-12-10 MED ORDER — BUMETANIDE 2 MG PO TAB
6 mg | ORAL_TABLET | Freq: Two times a day (BID) | ORAL | 1 refills | Status: AC
Start: 2020-12-10 — End: ?

## 2020-12-11 ENCOUNTER — Encounter: Admit: 2020-12-11 | Discharge: 2020-12-11 | Payer: MEDICARE

## 2020-12-12 ENCOUNTER — Encounter: Admit: 2020-12-12 | Discharge: 2020-12-12 | Payer: MEDICARE

## 2020-12-13 ENCOUNTER — Encounter: Admit: 2020-12-13 | Discharge: 2020-12-13 | Payer: MEDICARE

## 2020-12-13 ENCOUNTER — Ambulatory Visit: Admit: 2020-12-13 | Discharge: 2020-12-13 | Payer: MEDICARE

## 2020-12-13 DIAGNOSIS — E785 Hyperlipidemia, unspecified: Secondary | ICD-10-CM

## 2020-12-13 DIAGNOSIS — C4490 Unspecified malignant neoplasm of skin, unspecified: Secondary | ICD-10-CM

## 2020-12-13 DIAGNOSIS — I340 Nonrheumatic mitral (valve) insufficiency: Secondary | ICD-10-CM

## 2020-12-13 DIAGNOSIS — I350 Nonrheumatic aortic (valve) stenosis: Secondary | ICD-10-CM

## 2020-12-13 DIAGNOSIS — I10 Essential (primary) hypertension: Secondary | ICD-10-CM

## 2020-12-13 DIAGNOSIS — I4811 Longstanding persistent atrial fibrillation: Secondary | ICD-10-CM

## 2020-12-13 DIAGNOSIS — Z959 Presence of cardiac and vascular implant and graft, unspecified: Secondary | ICD-10-CM

## 2020-12-13 DIAGNOSIS — E8582 Wild-type transthyretin-related (ATTR) amyloidosis: Secondary | ICD-10-CM

## 2020-12-13 DIAGNOSIS — I361 Nonrheumatic tricuspid (valve) insufficiency: Secondary | ICD-10-CM

## 2020-12-13 DIAGNOSIS — I358 Other nonrheumatic aortic valve disorders: Secondary | ICD-10-CM

## 2020-12-13 DIAGNOSIS — N189 Chronic kidney disease, unspecified: Secondary | ICD-10-CM

## 2020-12-13 DIAGNOSIS — J45909 Unspecified asthma, uncomplicated: Secondary | ICD-10-CM

## 2020-12-13 DIAGNOSIS — Z8679 Personal history of other diseases of the circulatory system: Secondary | ICD-10-CM

## 2020-12-13 DIAGNOSIS — E859 Amyloidosis, unspecified: Secondary | ICD-10-CM

## 2020-12-13 DIAGNOSIS — I5032 Chronic diastolic (congestive) heart failure: Secondary | ICD-10-CM

## 2020-12-13 DIAGNOSIS — J302 Other seasonal allergic rhinitis: Secondary | ICD-10-CM

## 2020-12-13 NOTE — Telephone Encounter
Geronimo Running, APRN-NP  Angelyna Henderson, [place], BSN; Raynelle Jan, BSN; Bartholomew Boards, MD; Denita Lung, MD  Caller: Unspecified (Today, 9:33 AM)  Yes, please have [name] take a 2.5 mg metolazone (if he took 1.25 yesterday have him take 1.25 mg today). He is scheduled to see Dr. Fara Olden today at 1 pm, I will copy Dr. Darreld Mclean.      Heis supposed to have telehealth with [facility] on 9/16. Thanks. DM            Previous Messages      ----- Message -----   From: Marisa Severin, BSN   Sent: 12/13/2020  9:41 AM CDT   To: Geronimo Running, APRN-NP, [name]     ----- Message from Marisa Severin, BSN sent at 12/13/2020 9:41 AM CDT -----   [place]! [name] was supposed to see [facility] today via telehealth but it looks like the OV was canceled. Your last OV 9/9: If your mems reading is still up on Tuesday, I'll recommend taking a whole metolazone on Wednesday. His PAD on Tues was 25, but trending up to today's 29. Do you want to make any changes today?     Thanks, [employer]

## 2020-12-13 NOTE — Telephone Encounter
Telehealth OV with [facility] canceled today. PAD increasing to 29 today. Last OV with Annabelle Harman 9/9:If your mems reading is still up on Tuesday, I'll recommend taking a whole metolazone on Wednesday. Will review numbers with Annabelle Harman.          DAILY CardioMEMS READINGS    Goal PA Diastolic: 22 mmHg  PA Diastolic Thresholds: 20-24 mmHg          CARDIAC MEDS:    Home Medications    Medication Sig   acetaminophen (TYLENOL) 325 mg tablet Take two tablets by mouth every 6 hours as needed.   allopurinoL (ZYLOPRIM) 300 mg tablet Take 300 mg by mouth daily. Take with food.   amiodarone (CORDARONE) 200 mg tablet Take one tablet by mouth daily.   aspirin 81 mg chewable tablet Chew one tablet by mouth daily. Take with food.   bumetanide (BUMEX) 2 mg tablet Take three tablets by mouth twice daily.   cholecalciferol (vitamin D3) (VITAMIN D3) 1,000 units tablet Take 1,000 Units by mouth daily.   docusate (COLACE) 100 mg capsule Take 100 mg by mouth twice daily.   empagliflozin (JARDIANCE) 10 mg tablet Take one tablet by mouth daily.   ferrous sulfate (FEOSOL) 325 mg (65 mg iron) tablet Take 325 mg by mouth twice daily. Take on an empty stomach at least 1 hour before or 2 hours after food.    fluticasone/salmeterol (ADVAIR) 100/50 mcg inhalation disk Inhale 1 puff by mouth into the lungs daily.   lactobacillus rhamnosus (GG) (CULTURELLE) 10 billion cell cap Take 1 capsule by mouth daily with breakfast.   magnesium oxide (MAG-OX) 400 mg (241.3 mg magnesium) tablet Take one tablet by mouth daily.   metOLazone (ZAROXOLYN) 2.5 mg tablet Take Metolazone 2.5mg  (1 tablet) on Mondays and Fridays; take Metolazone 1.25mg  (1/2 of a tablet) on Wednesdays.   omeprazole DR (PRILOSEC) 20 mg capsule Take one capsule by mouth daily before breakfast.   polyethylene glycol 3350 (MIRALAX) 17 g packet Take one packet by mouth as Needed.   potassium chloride SR (KLOR-CON M20) 20 mEq tablet Take three tablets by mouth three times daily. Take additional 20 mEq on Metolazone days Mon-Wed-Fri.   rivaroxaban (XARELTO) 15 mg tablet Take one tablet by mouth daily with breakfast. Take with food.   rosuvastatin (CRESTOR) 5 mg tablet Take one tablet by mouth three times weekly.   senna (SENOKOT) 8.6 mg tablet Take 2 tablets by mouth twice daily as needed for Constipation.   sildenafiL (VIAGRA) 100 mg tablet TAKE 1 TABLET BY MOUTH ONCE DAILY AS NEEDED FOR SEXUAL ACTIVITY   tafamidis (VYNDAMAX) 61 mg capsule Take one capsule by mouth daily.   VENTOLIN HFA 90 mcg/actuation inhaler Inhale 1 puff by mouth into the lungs as Needed.       RECENT LABS:    Basic Metabolic Profile    Lab Results   Component Value Date/Time    NA 136 12/05/2020 12:00 AM    K 3.1 (L) 12/05/2020 12:00 AM    CA 9.5 12/05/2020 12:00 AM    CL 97 (L) 12/05/2020 12:00 AM    CO2 27 12/05/2020 12:00 AM    GAP 15 (H) 12/05/2020 12:00 AM    Lab Results   Component Value Date/Time    BUN 68 (H) 12/05/2020 12:00 AM    CR 2.71 (H) 12/05/2020 12:00 AM    GLU 156 (H) 12/05/2020 12:00 AM          UPCOMING APPOINTMENTS:    Future Appointments  Date Time Provider Department Center   12/13/2020  1:00 PM [name], MD [facility] IM   12/14/2020 12:00 PM Vanetta Shawl I, MD [facility] CVM Exam   12/27/2020 12:00 AM [name] Procedur   01/28/2021 12:00 AM [name] Procedur

## 2020-12-13 NOTE — Progress Notes
History     CC: ckd    HPI: Ray [name] is a 85 y.o. male presents to clinic for follow up of his CKD.  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 He has a tele visit scheduled with Dr. [name] tomorrow         Medication    HOME MEDS  ? acetaminophen (TYLENOL) 325 mg tablet Take two tablets by mouth every 6 hours as needed.   ? allopurinoL (ZYLOPRIM) 300 mg tablet Take 300 mg by mouth daily. Take with food.   ? amiodarone (CORDARONE) 200 mg tablet Take one tablet by mouth daily.   ? aspirin 81 mg chewable tablet Chew one tablet by mouth daily. Take with food.   ? bumetanide (BUMEX) 2 mg tablet Take three tablets by mouth twice daily.   ? cholecalciferol (vitamin D3) (VITAMIN D3) 1,000 units tablet Take 1,000 Units by mouth daily.   ? docusate (COLACE) 100 mg capsule Take 100 mg by mouth twice daily.   ? empagliflozin (JARDIANCE) 10 mg tablet Take one tablet by mouth daily.   ? ferrous sulfate (FEOSOL) 325 mg (65 mg iron) tablet Take 325 mg by mouth twice daily. Take on an empty stomach at least 1 hour before or 2 hours after food.    ? fluticasone/salmeterol (ADVAIR) 100/50 mcg inhalation disk Inhale 1 puff by mouth into the lungs daily.   ? lactobacillus rhamnosus (GG) (CULTURELLE) 10 billion cell cap Take 1 capsule by mouth daily with breakfast.   ? magnesium oxide (MAG-OX) 400 mg (241.3 mg magnesium) tablet Take one tablet by mouth daily.   ? metOLazone (ZAROXOLYN) 2.5 mg tablet Take Metolazone 2.5mg  (1 tablet) on Mondays and Fridays; take Metolazone 1.25mg  (1/2 of a tablet) on Wednesdays.   ? omeprazole DR (PRILOSEC) 20 mg capsule Take one capsule by mouth daily before breakfast.   ? polyethylene glycol 3350 (MIRALAX) 17 g packet Take one packet by mouth as Needed.   ? potassium chloride SR (KLOR-CON M20) 20 mEq tablet Take three tablets by mouth three times daily. Take additional 20 mEq on Metolazone days Mon-Wed-Fri.   ? rivaroxaban (XARELTO) 15 mg tablet Take one tablet by mouth daily with breakfast. Take with food.   ? rosuvastatin (CRESTOR) 5 mg tablet Take one tablet by mouth three times weekly.   ? senna (SENOKOT) 8.6 mg tablet Take 2 tablets by mouth twice daily as needed for Constipation.   ? sildenafiL (VIAGRA) 100 mg tablet TAKE 1 TABLET BY MOUTH ONCE DAILY AS NEEDED FOR SEXUAL ACTIVITY   ? tafamidis (VYNDAMAX) 61 mg capsule Take one capsule by mouth daily.   ? VENTOLIN HFA 90 mcg/actuation inhaler Inhale 1 puff by mouth into the lungs as Needed.          Review of Systems  Constitutional: negative  Eyes: negative  Ears, nose, mouth, throat, and face: negative  Respiratory: negative  Cardiovascular: negative  Gastrointestinal: negative  Genitourinary:negative  Integument/breast: negative  Hematologic/lymphatic: negative  Musculoskeletal:leg swelling  Neurological: negative  Endocrine: negative      Physical Exam   Vitals:    12/13/20 1256 12/13/20 1304   BP: 125/44 127/40   Pulse: 75    Temp: 36.7 ?C (98.1 ?F)    Resp: 16    SpO2: 100%    PainSc: Zero    Weight: 73.9 kg (163 lb)  Height: 167.6 cm (5' 6)      Body mass index is 26.31 kg/m?.    Gen: Alert and oriented   HEENT: Sclera normal   CV: no JVD, S1 and S2 normal, no rubs, murmurs or gallops   Pulm: Clear to auscultation bilateral   GI : non-distended, non-tender to palpation  Neuro: Grossly normal, moving all extremities, speech intact  Ext: 2+ edema  Skin: no rashes       Assessment and Plan      Ray [name] is a 85 y.o. male who follows in clinic for CKD         Chronic kidney disease  -serum creatinine has ranged from 2.3mg /dl to ?2.23mg /dl last 2 years.  -has 2+ proteinuria with 2grams protein  -renal ultrasound from 11/2017 with no structural abnormalities  -CKD is likely from HTN but?did?paraproteinemia work up?as he has cardiac dysfunction and it was normal.  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  -we discussed the natural course of CKD  -discussed the need to avoid NSAIDS, contrast studies  -no dietary restrictions from a kidney standpoint; he was asked to follow a low salt diet  ?-no proteinuria on recent urinalysis  -most recent BMP from 12/05/20 with BUN of 68 and creatinine of 2.71mg /dl   -he feels well and has no symptoms of uremia  -discussed with him that he may need dialysis for fluid overload if develops diuretic resistance  ?  ?  ?  ?  Heart failure with preserved ejection fraction  -s/p cardiomems 12/15/19  -on bumex and metolazone  -have asked him to discuss uptitration of diuretics for his LE edema  -may need daily metolazone (notified Cardiology)  -considering watchman procedure  ?  Wild type ATTR amyloidosis  ?  HTN  -controlled    Hypokalemia  -on supplementation  ?  Atrial Fibrillation  Hyperlipidemia  ?  ?Hematuria  -was noted in 2019 as well  -he is on xarelto  -no hematuria on repeat urinalysis in 11/2019  -seen by urology  ?  ?  Malachy Chamber, MD   Pager [phone]    Patient Instructions   Please do not hesitate to call with any questions.  My nurse Dahlia Client can be reached [phone].   Please have Cardiology team adjust your diuretics when you see them tomorrow  You have chronic kidney disease stage 4    Return to clinic in 6 months

## 2020-12-13 NOTE — Telephone Encounter
Called to speak with Octavia Bruckner and Helene Kelp. Updated them on the instructions from [place]. He has re-scheduled telehealth OV with [facility] tomorrow. He took metolazone 1.25 mg on Weds and will take an additional 1.25 today. Helene Kelp stated his legs are still swollen but the swelling doesn't really go away. They had no questions.

## 2020-12-14 ENCOUNTER — Encounter: Admit: 2020-12-14 | Discharge: 2020-12-14 | Payer: MEDICARE

## 2020-12-14 ENCOUNTER — Ambulatory Visit: Admit: 2020-12-14 | Discharge: 2020-12-15 | Payer: MEDICARE

## 2020-12-14 DIAGNOSIS — I5033 Acute on chronic diastolic (congestive) heart failure: Secondary | ICD-10-CM

## 2020-12-14 DIAGNOSIS — I52 Other heart disorders in diseases classified elsewhere: Secondary | ICD-10-CM

## 2020-12-14 MED ORDER — METOLAZONE 2.5 MG PO TAB
5 mg | ORAL_TABLET | ORAL | 3 refills | 84.00000 days | Status: AC
Start: 2020-12-14 — End: ?

## 2020-12-14 NOTE — Progress Notes
Daily CardioMEMS Readings    Goal PA Diastolic: 22 mmHg   PA Diastolic Thresholds: 123456 mmHg

## 2020-12-18 ENCOUNTER — Encounter: Admit: 2020-12-18 | Discharge: 2020-12-18 | Payer: MEDICARE

## 2020-12-18 DIAGNOSIS — I5033 Acute on chronic diastolic (congestive) heart failure: Secondary | ICD-10-CM

## 2020-12-18 LAB — CBC
HEMATOCRIT: 31 — ABNORMAL LOW (ref 42–52)
HEMOGLOBIN: 9.2 — ABNORMAL LOW
MCH: 31 — ABNORMAL HIGH (ref 27–31)
MCHC: 28 — ABNORMAL LOW (ref 30–34)
MCV: 110 — ABNORMAL HIGH (ref 80–99)
PLATELET COUNT: 221 (ref 130–400)
RBC COUNT: 2.8 — ABNORMAL LOW (ref 4.7–6.1)
RDW: 16 — ABNORMAL HIGH (ref 11.5–14.5)
WBC COUNT: 7.5 (ref 4.8–10.8)

## 2020-12-19 ENCOUNTER — Ambulatory Visit: Admit: 2020-12-19 | Discharge: 2020-12-19 | Payer: MEDICARE

## 2020-12-19 ENCOUNTER — Encounter: Admit: 2020-12-19 | Discharge: 2020-12-19 | Payer: MEDICARE

## 2020-12-19 DIAGNOSIS — I472 Ventricular tachycardia: Secondary | ICD-10-CM

## 2020-12-19 DIAGNOSIS — I482 Chronic atrial fibrillation, unspecified: Secondary | ICD-10-CM

## 2020-12-19 DIAGNOSIS — J45909 Unspecified asthma, uncomplicated: Secondary | ICD-10-CM

## 2020-12-19 DIAGNOSIS — I340 Nonrheumatic mitral (valve) insufficiency: Secondary | ICD-10-CM

## 2020-12-19 DIAGNOSIS — N189 Chronic kidney disease, unspecified: Secondary | ICD-10-CM

## 2020-12-19 DIAGNOSIS — I10 Essential (primary) hypertension: Secondary | ICD-10-CM

## 2020-12-19 DIAGNOSIS — C4490 Unspecified malignant neoplasm of skin, unspecified: Secondary | ICD-10-CM

## 2020-12-19 DIAGNOSIS — J302 Other seasonal allergic rhinitis: Secondary | ICD-10-CM

## 2020-12-19 DIAGNOSIS — I5030 Unspecified diastolic (congestive) heart failure: Secondary | ICD-10-CM

## 2020-12-19 DIAGNOSIS — I358 Other nonrheumatic aortic valve disorders: Secondary | ICD-10-CM

## 2020-12-19 DIAGNOSIS — I5032 Chronic diastolic (congestive) heart failure: Secondary | ICD-10-CM

## 2020-12-19 DIAGNOSIS — E785 Hyperlipidemia, unspecified: Secondary | ICD-10-CM

## 2020-12-19 DIAGNOSIS — I5033 Acute on chronic diastolic (congestive) heart failure: Secondary | ICD-10-CM

## 2020-12-19 DIAGNOSIS — E859 Amyloidosis, unspecified: Secondary | ICD-10-CM

## 2020-12-19 DIAGNOSIS — I350 Nonrheumatic aortic (valve) stenosis: Secondary | ICD-10-CM

## 2020-12-19 DIAGNOSIS — E8582 Wild-type transthyretin-related (ATTR) amyloidosis: Secondary | ICD-10-CM

## 2020-12-19 DIAGNOSIS — I5020 Unspecified systolic (congestive) heart failure: Secondary | ICD-10-CM

## 2020-12-19 DIAGNOSIS — I361 Nonrheumatic tricuspid (valve) insufficiency: Secondary | ICD-10-CM

## 2020-12-19 DIAGNOSIS — Z8679 Personal history of other diseases of the circulatory system: Secondary | ICD-10-CM

## 2020-12-19 MED ORDER — BUMETANIDE 0.25 MG/ML IJ SOLN
2-3 mg | INTRAVENOUS | 0 refills | Status: CP
Start: 2020-12-19 — End: ?
  Administered 2020-12-19 (×3): 2 mg via INTRAVENOUS

## 2020-12-19 MED ORDER — MAGNESIUM OXIDE 400 MG (241.3 MG MAGNESIUM) PO TAB
400 mg | Freq: Once | ORAL | 0 refills | Status: CN
Start: 2020-12-19 — End: ?

## 2020-12-19 MED ORDER — FUROSEMIDE 10 MG/ML IJ SOLN
40-80 mg | INTRAVENOUS | 0 refills | Status: CN
Start: 2020-12-19 — End: ?

## 2020-12-19 MED ORDER — POTASSIUM CHLORIDE 10 MEQ PO TBTQ
10-60 meq | ORAL | 0 refills | Status: DC | PRN
Start: 2020-12-19 — End: 2020-12-19
  Administered 2020-12-19: 17:00:00 60 meq via ORAL

## 2020-12-19 MED ORDER — POTASSIUM CHLORIDE 10 MEQ PO TBTQ
10-60 meq | ORAL | 0 refills | Status: CN | PRN
Start: 2020-12-19 — End: ?

## 2020-12-19 MED ORDER — MAGNESIUM OXIDE 400 MG (241.3 MG MAGNESIUM) PO TAB
400 mg | Freq: Once | ORAL | 0 refills
Start: 2020-12-19 — End: ?

## 2020-12-19 MED ORDER — MAGNESIUM OXIDE 400 MG (241.3 MG MAGNESIUM) PO TAB
400 mg | Freq: Once | ORAL | 0 refills | Status: CP
Start: 2020-12-19 — End: ?
  Administered 2020-12-19: 17:00:00 400 mg via ORAL

## 2020-12-19 MED ORDER — BUMETANIDE 0.25 MG/ML IJ SOLN
2-3 mg | INTRAVENOUS | 0 refills
Start: 2020-12-19 — End: ?

## 2020-12-19 MED ORDER — BUMETANIDE 0.25MG/ML IV DRIP (STD CONC)
2-3 mg/h | INTRAVENOUS | 0 refills | Status: DC
Start: 2020-12-19 — End: 2020-12-19
  Administered 2020-12-19: 17:00:00 2 mg/h via INTRAVENOUS

## 2020-12-19 MED ORDER — BUMETANIDE 0.25MG/ML IV DRIP (STD CONC)
2-3 mg/h | INTRAVENOUS | 0 refills
Start: 2020-12-19 — End: ?

## 2020-12-19 MED ORDER — POTASSIUM CHLORIDE 10 MEQ PO TBTQ
10-60 meq | ORAL | 0 refills | PRN
Start: 2020-12-19 — End: ?

## 2020-12-19 MED ORDER — FUROSEMIDE IV DRIP SYR (MAX CONC)
40-80 mg/h | INTRAVENOUS | 0 refills | Status: CN
Start: 2020-12-19 — End: ?

## 2020-12-19 MED ORDER — BUMETANIDE 0.25MG/ML IV DRIP (STD CONC)
2-3 mg/h | INTRAVENOUS | 0 refills | Status: CN
Start: 2020-12-19 — End: ?

## 2020-12-19 MED ORDER — BUMETANIDE 0.25 MG/ML IJ SOLN
2-3 mg | INTRAVENOUS | 0 refills | Status: CN
Start: 2020-12-19 — End: ?

## 2020-12-19 NOTE — Patient Instructions
Instructions for Heart Failure Infusion Clinic Located on [facility] 9    You are scheduled for outpatient IV infusion therapy on: 12/20/20    Please check-in at the [facility] Admitting Desk at: 8:45 am      Please arrive 15 minutes early so that you can check-in at admitting. If your appointment is prior to 2:30 pm you will need to check in at the [facility] Admitting (next to the [facility]). If your appointment is after 2:30pm you will need to check in at [facility] Admitting (next to the gift shop, main lobby).     Heart Failure Infusion Clinic late policy: You will not be seen if you are more than 15 minutes late to check in for your appointment. If you are unable to make your appointment time please call us in advance for rescheduling.    You should expect to be here for infusion therapy for approximately 4 hours.     If you are diabetic please make sure to bring any important medications with you including insulin.     You may want to bring a snack or two with you as well.     Please bring your blood pressure and weight logs.        If you need to cancel your infusion appointment please contact the Heart Failure Infusion Clinic at [phone].    Additional Heart Failure discharge instructions    1.  Weigh self each morning and record.  Please bring your blood pressure and weight logs.    2.  Follow a 2 gram sodium diet restriction    3. Limit fluid intake 64 ounces or less.  Do not push fluids.     4. Please call the Heart Failure Infusion Nurse Line at [phone] for any questions you may have about infusion therapy.    5.  Call [facility] at [phone] for any worsening symptoms of shortness of breath, lightheadedness, increasing swelling or weight gain >3 pounds overnight or 5 pounds in one week.  Please inform triage nurse that you are being treated in the Heart Failure Infusion Clinic.       Summary for Heart Failure Infusion Clinic   Today's Date: 12/19/2020               Today?s Goal: 2 L      Today you diuresed a total of 1350 ml     Total dose of IV Push: 6mg  of Bumex   400 mg of Magnesium   60 mEq of Potassium      IV Infusion dose was: 2mg /hr x 3 hrs     Pre-infusion weight was: 160.8 lbs     Post-Infusion weight was: 159.2 lbs     Total Net Output: -       Post Infusion Vitals for today:                Blood Pressure:138/55              Heart Rate:76              Oxygen Saturation:100%

## 2020-12-19 NOTE — Progress Notes
ThanDate of Service: 12/19/2020    Ray [name] is a 85 y.o. male.       HPI      I had the pleasure of seeing Ray [name], who is accompanied by his wife Ray [name] today in [place] [facility] clinic for amyloidosis follow-up.       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    He last spoke to Ray [name].  Ray [name] on 9/16, at that time he had increase the metolazone to 5 mg on Monday Wednesday Friday and ordered repeat labs including CBC, CMP and BN P.    Today Ray [name] reports that the swelling in his legs has continued, he has abdominal distention, early satiety, he did increase the metolazone as instructed.  He states he is not sleeping well, he does not urinate very much during the daytime and is urinating more in the evening.  He thinks he sleeping only 2 to 3 hours at night.  He has had bleeding of his left knee, his wife also notes that he has fine petechial bleeding on his back so much so that they need to apply Hx to the bedding.  He has restarted cardiac rehab and was able to do the NuStep, treadmill and recumbent bike for 5 minutes each.  He did go to [place] and had his labs drawn yesterday as instructed.  He is taking his cardiovascular medications as prescribed.  Home blood pressure was 104/54.         Vitals:    12/19/20 0909   BP: 129/68   BP Source: Arm, Left Upper   Pulse: 75   SpO2: 96%   PainSc: Zero   Weight: 74.1 kg (163 lb 6.4 oz)   Height: 167.6 cm (5' 6)     Body mass index is 26.37 kg/m?[name]     Past Medical History  Patient Active Problem List    Diagnosis Date Noted   ? 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  ?  01/26/2019 Echo EF:?60%  IVS 1.50 cm (Range: 0.6 - 1.0)         LV PW 1.40 cm (Range: 0.6 - 1.0)         ?  1. Moderate concentric left ventricular hypertrophy  2. Unable to assess diastolic function due to atrial fibrillation  3. Moderate biatrial dilatation  4. Normal right ventricular size and systolic function  5. Normal central venous pressure  6. Mild calcific aortic valve stenosis. ?Mean gradient is 16 mmHg with a peak velocity of 2.8 m/s. ?Trace aortic valve regurgitation is seen  7. Mitral valve is structurally okay. ?There is moderate central regurgitation. ?No stenosis  8. Moderate tricuspid valve regurgitation  9. Pulmonary artery static pressure measures at 33 mmHg   06/28/2019 TC PYP Multiview planar views and SPECT imaging confirms the presence of severe marked diffuse global left ventricular and right ventricular myocardial uptake of tracer. ?There is normal rib and bone uptake.  ?  Two hour heart/contralateral lung (H/CL) ratio: ?1.977?this metric is abnormal and highly suggestive of ATTR cardiac amyloidosis.?  ?  Visual Semi-Quantitative Grading Scale Analysis:?  The study is grade 3 indicating myocardial uptake greater than rib and bone uptake. ?The  pattern is highly and strongly suggestive of ATTR cardiac amyloidosis.  ?  SUMMARY/OPINION:   This study is abnormal and strongly suggestive of ATTR cardiac amyloidosis. ?Left ventricular myocardial uptake is diffuse, intense, and global. ?There is also prominent right ventricular free wall uptake of tracer. ?The pattern is typical for ATTR cardiac amyloidosis   ? Cardiac MRI ?   07/25/2019 Kappa/Lambda FLC ?  ? Ref. Range 07/25/2019 15:48   Kappa, FLC Latest Ref Range: 0.33 - 1.94 MG/DL 4.78 (H)   Lambda, FLC Latest Ref Range: 0.57 - 2.63 MG/DL 2.95 (H)   Kappa/Lambda FLC Latest Ref Range: 0.26 - 1.65  1.83 (H)      07/25/2019 Serum Immunofixation ?NO PARAPROTEIN SEEN   cancelled Urine Immunofixation ?Patient unable to provide urine sample 4/26    07/25/2019 Genetic Testing ?Negative   ? Biopsy (Fat Pad, Cardiac, Bone Marrow) ?   ? GI Symptoms ?   ?+? 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   ?  ? Ref. Range 07/25/2019 15:48   B Type Natriuretic Peptide Latest Ref Range: 0 - 100 PG/ML 966.0 (H)   Troponin-I Latest Ref Range: 0.0 - 0.05 NG/ML 0.05   ?  Completed:  -Tafamidis start 08/2019. PA approved, pt approved for [facility] patient assistance from 09/01/2019 to 03/30/2020  -Baseline KCCQ12 completed 5/18/202, Summary Score 61.46  -Baseline 6 min walk completed 08/16/2019, distance walked 950 feet, 289.75 meters, duration of test 6 minutes  -Baseline CPET completed 10/17/2019       ? Iron deficiency anemia 11/27/2020   ? Heart failure (HCC) 11/26/2020   ? AKI (acute kidney injury) (HCC) 11/26/2020   ? S/P TAVR (transcatheter aortic valve replacement) 10/25/2020   ? Hypokalemia 10/25/2020   ? Mitral regurgitation 09/06/2020   ? Nonrheumatic tricuspid valve regurgitation 09/06/2020   ? Nonrheumatic aortic valve stenosis 09/06/2020   ? Asymptomatic microscopic hematuria 12/25/2019   ? S/P left pulmonary artery pressure sensor implant placement 12/15/2019     *Patient has CardioMEMS (Pulmonary Artery Pressure Sensor)* Please call Ray [name], CardioMEMs Program Coordinator [phone] or page Heart Failure Rounding Team if patient is admitted or presents to ED*          ? Chronic heart failure with preserved ejection fraction (HCC) 12/15/2019   ? Acute on chronic diastolic CHF (congestive heart failure), NYHA class 2 (HCC) 11/14/2019   ? S/P ablation of atrial fibrillation 05/18/2018   ? Lightheadedness 03/16/2018   ? Hospitalization within last 30 days 12/24/2017   ? Atrial fibrillation with RVR (HCC) 12/14/2017   ? Chronic kidney disease, stage 4 (severe) (HCC) 12/13/2017   ? Junctional bradycardia 12/12/2017   ? Bilateral leg edema 12/11/2017   ? On amiodarone therapy 12/11/2017   ? Heart failure with preserved ejection fraction (HCC) 01/01/2016   ? NSVT (nonsustained ventricular tachycardia) (HCC) 06/12/2015   ? Medication side effects 11/09/2014   ? Heart palpitations 09/19/2014   ? Longstanding persistent atrial fibrillation (HCC) 03/17/2013     03/26/2018 - ECHO:  Moderate concentric LVH seen. The EF is about 55%.  The atria are slightly dilated.  The RV function is mildly decreased.  Mild to moderate MR and TR seen.  Mild AI is present.  Based on the gradients and appearance, the AS appears to be mild. The mean  gradient is 16 mm Hg with a peak velocity of 2.6 m/s.  03/26/2018 - Zio Patch:  Predominant rhythm was normal sinus.  Two runs of ventricular tachycardia occurred, they were monomorphic, the longest run lasted 3 seconds, this episode occurred on April 08, 2018 at 9:41 PM.  One episode of AIVR also appeared to be present, it lasted approximately 4.6 seconds  Brief and rare episodes of SVT that probably represent atrial fibrillation, the longest lasting 6.3 seconds.  No evidence of atrial flutter and no evidence of high degree AV block.  05/12/2018 - LAAA:  Persistent Atrial Fibrillation status post successful pulmonary vein antral isolation.  Cavotricuspid Isthmus Ablation.  Ablation of Fractionated Intracardiac Electrograms.  Ablation for an Additional Discrete Arrhythmia-LA AFL after AFIB/PVI Ablation  06/23/2018 - Cardioversion:  Successful direct current cardioversion from symptomatic atrial fibrillation to sinus rhythm.  03/16/2019 - DCCV:  Successful direct current cardioversion from atrial fibrillation to sinus rhythm.     ? Systolic murmur of aorta 02/04/2013   ? SOB (shortness of breath) 02/04/2013   ? Carotid bruit present 01/23/2012   ? Overweight (BMI 25.0-29.9) 01/30/2011   ? Essential hypertension 01/30/2009   ? Hyperlipidemia 01/30/2009     Hyperlipidemia, on statin therapy     ? Skin cancer 01/30/2009   ? Seasonal allergic reaction 01/30/2009         Review of Systems   Constitutional: Negative.   HENT: Negative.    Eyes: Negative.    Cardiovascular: Negative.    Respiratory: Negative.    Endocrine: Negative.    Hematologic/Lymphatic: Negative.    Skin: Negative.    Musculoskeletal: Negative.    Gastrointestinal: Positive for constipation.   Genitourinary: Negative.    Neurological: Negative.    Psychiatric/Behavioral: Negative.    Allergic/Immunologic: Negative.        Physical Exam  General Appearance: In NAD.   Neck Veins: 7 cm JVP, neck veins are distended, + HJR   Chest Inspection: Left subclavian incision clean, dry and intact  Respiratory Effort: breathing comfortably, no respiratory distress   Auscultation/Percussion: lungs clear to auscultation, no rales, rhonchi or wheezing   Cardiac Rhythm: irregularly irregular rhythm and normal rate   Cardiac Auscultation: S1, S2 normal, no rub, no definite S3  or S4   Murmurs: grade iii/vi systolic ejection murmur at left mid axillary line  Peripheral Circulation: normal peripheral circulation   Pedal Pulses: normal symmetric pedal pulses   Lower Extremity Edema: + 2 mm bilateral peripheral edema to the knees.  Venous discoloration, small oozing of blood at his knees.. .  Abdominal Exam: soft non-tender, no obvious masses, bowel sounds normal   Gait & Station: walks without assistance   Orientation: oriented to person, place and time   Affect & Mood: appropriate and sustained affect   Language and Memory: patient responsive and seems to comprehend information   Neurologic Exam: neurological assessment grossly intact   Vital signs were reviewed      Cardiovascular Studies:    Echo 10/25/2020:Interpretation Summary   ? The left ventricle appears small. Moderate concentric hypertrophy. The visually estimated ejection fraction is 60%. The ejection fraction by Simpson's biplane method is 57%. There are no segmental wall motion abnormalities. Cannot determine left ventricular diastolic function.  ? Right Ventricle: The right ventricle is mildly dilated. The right ventricular systolic function is at the lower limit of normal. PASP is estimated at 32 mmHg.  ? Left Atrium: Severely dilated. Right Atrium: Severely dilated.  ? Mitral  Valve: Normal valve structure. No stenosis. Severe regurgitation. The jet is centrally directed.  ? Tricuspid Valve: Normal valve structure. No stenosis. Moderate to severe regurgitation.  ? Aortic Valve: There is a 35 mm S3 bioprosthetic valve present. The prosthetic valve is normal. No stenosis. No regurgitation.  ? The aortic root and ascending aorta are normal in size.  ? No pericardial effusion.    CHEST: 09/28/2020  1. Thickening and calcification of the aortic valve with a normal caliber   ascending thoracic aorta.   2. Development of several sub-5 mm nodules scattered throughout both   lungs. These are nonspecific though are most likely infectious or   inflammatory. In a low-risk patient no follow-up needed. In a high-risk   patient follow-up chest CT at 12 months is suggested. ?   3. Cardiomegaly and coronary artery disease   4. Pulmonary hypertension     ABDOMEN AND PELVIS:   1. Normal caliber abdominal aorta and common iliac arteries with moderate   diffuse atherosclerosis.   2. Moderate to severe focal stenosis at the origin of the SMA.   3. Moderate distal colonic diverticulosis. ?     6 Minute Walk Last Results 06/26/2020 08/16/2019   SpO2:  Pre-Activity 99 99   Pulse:  POST-Activity 94 122   BP:  POST-Activity 126/76 151/76   SpO2:  POST-Activity 99 96   Distance Walked in Meters 152.5 289.75       Problems Addressed Today  Encounter Diagnoses   Name Primary?   ? Aortic stenosis, mild Yes       Assessment and Plan   Wild-type ATTR amyloidosis:   -He has had a previous technetium pyrophosphate scan on 06/28/2019 that showed a 2-hour heart/contralateral lung ratio of 1.97 with visual semiquantitative grading grade 3, abnormal and highly suggestive of ATTR cardiac amyloidosis.   - He has had kappa 7.45, lambda 4.07 and free light chain ratio 1.83 on 07/25/2019  -He had serum electrophoresis that showed no paraprotein.   -See 6-minute walk noted above  -He has completed a KCC Q score that was 72.92 indicative of fair to good quality of life.    -He is on tafamidis 61 mg daily.    -He had a baseline CPET on 7/19 2021, will repeat this after he is recovered from his TAVR.  -He had repeat TC PYP on 06/11/2020, it showed 2-hour heart/contralateral lung ratio reduced to 1.7, visual semiquantitative grading scale remains at 3.  -Follow-up appointment with Dr. Sherryll [name] on 9/15.  Of note Dr. Sherryll [name] had talked to him about potentially starting Onpattro for open label extension of the Apollo B trial.       Heart failure with preserved ejection fraction:  - He appears hypervolemic on physical exam.  He is currently taking Bumex 6 mg twice daily and metolazone as instructed (currently 2.5 mg on Mondays and Friday and 1.25 on Wednesday.  He is also on Jardiance 10 mg daily, unable to use spironolactone due to low EGFR.  His CardioMEMS reading today was 2 7, threshold is 20-24,  CardioMEMS in situ: -12/15/19 IMPLANT RHC:?  BP:?126/76 (99)  RA:?27  RV:?60/32  PA:?57/31 (42)  PCWP:?30  TPG:?12  PVR:?3.8  SVR:?1840.  CO (Fick):?3.1  CI (Fick):?1.7?  AVO2 Diff 7.18?  Daily CardioMEMS Readings  ?Goal PA Diastolic:  22  PA Diastolic Thresholds:  20-24    His CardioMEMS reading today was 38, his goal is 22 with thresholds 20-24.  He is currently on Bumex 6  mg twice daily and metolazone 5 mg Mondays, Wednesdays and Fridays.  He had labs drawn yesterday and unfortunately they did not draw a chemistry.  We will get a point-of-care BMP today.  I think he needs to go to infusion clinic for IV diuresis at least today and tomorrow, he has cardiac rehab on Friday, potentially could have IV infusion after he goes to cardiac rehab in the morning.  Of note he recently saw Ray [name] with nephrology on 9/15, she thought that he would likely need metolazone every day.    Hypokalemia: Potassium on POC today shows potassium 3.0, creatinine 3.0, will send him to infusion clinic today, once he is diuresed he will likely need to be on metolazone every day as Ray [name] had recommended.  His correction factor for potassium will be 60 M EQ p.o. on arrival to infusion clinic.  His point-of-care BNP today is elevated at 2760, last BNP was 3361.  Of note when he was hospitalized in August for his TAVR he did have a right heart cath on 8/29 that showed RA 20, RV 67/20, PA 67/25 with a mean of 39, wedge was 20, cardiac output by thermodilution was 4.4 with index 1.9.    Severe aortic stenosis status post TAVR with Ray [name] in [place] on 7/27:   - He had CT chest and abdomen with contrast on 7/1 that showed thickening and calcification of the aortic valve with a normal caliber ascending thoracic aorta, several sub-5 mm nodules scattered throughout both lungs, cardiomegaly, coronary artery disease and pulmonary hypertension.    -He had postprocedure echo on 7/28 that showed prosthetic valve was normal, no stenosis or regurgitation.  -  He is scheduled for follow-up with Ray [name] this afternoon with repeat echo.     Severe mitral regurgitation that is noted on most recent echo, in the future he may need a MitraClip.    CKD stage III: See above, follows with Ray [name]..       Atrial fibrillation: He continues to take amiodarone 200 mg p.o.daily, he is on oral anticoagulation with Xarelto 15 mg daily, he denies any upper or lower GI bleeding symptoms, however he does continue to have increased bruising since the addition of aspirin 81 mg daily per TAVR protocol..  He has been in atrial fibrillation since at least 2020.     Permanent pacemaker in situ: He underwent permanent pacemaker insertion with Dr. Milas Kocher on 9/1, final device evaluation on 9/2 showed normal function.  He had postoperative chest x-ray on 9/2 that showed small pleural effusions and improved edema, interval placement of dual-chamber pacemaker with leads terminating in the right atrium and right ventricle.        I have personally documented the HPI, exam and medical decision making.  Patient education: I reviewed recent lab results and current medications, medication instructions, discussed heart failure signs & symptoms,  low  sodium diet, fluid restriction and daily weights.I have instructed the patient on the plan of care and they verbalize understanding of the plan. Please see AVS for full patient teaching. Patient advised to call our office if s/he has any problems, questions, worsening symptoms, or concerns prior to the next appointment.   Thanks for allowing me to see this nice patient. If I can be of additional assistance, please don't hesitate to contact me.     [name] AGPCNP-BC, [facility]  Pager [phone]  Collaborating physician: Dr. Vanetta Shawl    Total Time Today was 40 minutes  in the following activities: Preparing to see the patient, Obtaining and/or reviewing separately obtained history, Performing a medically appropriate examination and/or evaluation, Counseling and educating the patient/family/caregiver, Ordering medications, tests, or procedures, Referring and communication with other health care professionals (when not separately reported), Documenting clinical information in the electronic or other health record, Independently interpreting results (not separately reported) and communicating results to the patient/family/caregiver and Care coordination (not separately reported)    Follow up: will see him after he has completed infusion therapy.                        Current Medications (including today's revisions)  ? acetaminophen (TYLENOL) 325 mg tablet Take two tablets by mouth every 6 hours as needed.   ? allopurinoL (ZYLOPRIM) 300 mg tablet Take 300 mg by mouth daily. Take with food.   ? amiodarone (CORDARONE) 200 mg tablet Take one tablet by mouth daily.   ? aspirin 81 mg chewable tablet Chew one tablet by mouth daily. Take with food.   ? bumetanide (BUMEX) 2 mg tablet Take three tablets by mouth twice daily.   ? cholecalciferol (vitamin D3) (VITAMIN D3) 1,000 units tablet Take 1,000 Units by mouth daily.   ? docusate (COLACE) 100 mg capsule Take 100 mg by mouth twice daily.   ? empagliflozin (JARDIANCE) 10 mg tablet Take one tablet by mouth daily.   ? ferrous sulfate (FEOSOL) 325 mg (65 mg iron) tablet Take 325 mg by mouth twice daily. Take on an empty stomach at least 1 hour before or 2 hours after food.    ? fluticasone/salmeterol (ADVAIR) 100/50 mcg inhalation disk Inhale 1 puff by mouth into the lungs daily.   ? lactobacillus rhamnosus (GG) (CULTURELLE) 10 billion cell cap Take 1 capsule by mouth daily with breakfast.   ? magnesium oxide (MAG-OX) 400 mg (241.3 mg magnesium) tablet Take one tablet by mouth daily.   ? metOLazone (ZAROXOLYN) 2.5 mg tablet Take two tablets by mouth three times weekly. Take 5 mg on Monday, Wed, and Friday   ? omeprazole DR (PRILOSEC) 20 mg capsule Take one capsule by mouth daily before breakfast.   ? polyethylene glycol 3350 (MIRALAX) 17 g packet Take one packet by mouth as Needed.   ? potassium chloride SR (KLOR-CON M20) 20 mEq tablet Take three tablets by mouth three times daily. Take additional 20 mEq on Metolazone days Mon-Wed-Fri.   ? rivaroxaban (XARELTO) 15 mg tablet Take one tablet by mouth daily with breakfast. Take with food.   ? rosuvastatin (CRESTOR) 5 mg tablet Take one tablet by mouth three times weekly.   ? senna (SENOKOT) 8.6 mg tablet Take 2 tablets by mouth twice daily as needed for Constipation.   ? sildenafiL (VIAGRA) 100 mg tablet TAKE 1 TABLET BY MOUTH ONCE DAILY AS NEEDED FOR SEXUAL ACTIVITY   ? tafamidis (VYNDAMAX) 61 mg capsule Take one capsule by mouth daily.   ? VENTOLIN HFA 90 mcg/actuation inhaler Inhale 1 puff by mouth into the lungs as Needed.

## 2020-12-19 NOTE — Progress Notes
Daily CardioMEMS Readings    Goal PA Diastolic: 22 mmHg   PA Diastolic Thresholds: 68-12 mmHg  Notified Roma Kayser

## 2020-12-20 ENCOUNTER — Ambulatory Visit: Admit: 2020-12-20 | Discharge: 2020-12-20 | Payer: MEDICARE

## 2020-12-20 ENCOUNTER — Encounter: Admit: 2020-12-20 | Discharge: 2020-12-20 | Payer: MEDICARE

## 2020-12-20 DIAGNOSIS — I358 Other nonrheumatic aortic valve disorders: Secondary | ICD-10-CM

## 2020-12-20 DIAGNOSIS — I340 Nonrheumatic mitral (valve) insufficiency: Secondary | ICD-10-CM

## 2020-12-20 DIAGNOSIS — E785 Hyperlipidemia, unspecified: Secondary | ICD-10-CM

## 2020-12-20 DIAGNOSIS — I350 Nonrheumatic aortic (valve) stenosis: Secondary | ICD-10-CM

## 2020-12-20 DIAGNOSIS — E859 Amyloidosis, unspecified: Secondary | ICD-10-CM

## 2020-12-20 DIAGNOSIS — I361 Nonrheumatic tricuspid (valve) insufficiency: Secondary | ICD-10-CM

## 2020-12-20 DIAGNOSIS — C4490 Unspecified malignant neoplasm of skin, unspecified: Secondary | ICD-10-CM

## 2020-12-20 DIAGNOSIS — I10 Essential (primary) hypertension: Principal | ICD-10-CM

## 2020-12-20 DIAGNOSIS — Z8679 Personal history of other diseases of the circulatory system: Secondary | ICD-10-CM

## 2020-12-20 DIAGNOSIS — J45909 Unspecified asthma, uncomplicated: Secondary | ICD-10-CM

## 2020-12-20 DIAGNOSIS — N189 Chronic kidney disease, unspecified: Secondary | ICD-10-CM

## 2020-12-20 DIAGNOSIS — I5033 Acute on chronic diastolic (congestive) heart failure: Secondary | ICD-10-CM

## 2020-12-20 DIAGNOSIS — J302 Other seasonal allergic rhinitis: Secondary | ICD-10-CM

## 2020-12-20 MED ORDER — MAGNESIUM OXIDE 400 MG (241.3 MG MAGNESIUM) PO TAB
400 mg | Freq: Once | ORAL | 0 refills
Start: 2020-12-20 — End: ?

## 2020-12-20 MED ORDER — BUMETANIDE 0.25 MG/ML IJ SOLN
2-3 mg | INTRAVENOUS | 0 refills
Start: 2020-12-20 — End: ?

## 2020-12-20 MED ORDER — POTASSIUM CHLORIDE 10 MEQ PO TBTQ
10-60 meq | ORAL | 0 refills | Status: DC | PRN
Start: 2020-12-20 — End: 2020-12-20
  Administered 2020-12-20: 14:00:00 50 meq via ORAL

## 2020-12-20 MED ORDER — BUMETANIDE 0.25MG/ML IV DRIP (STD CONC)
2-3 mg/h | INTRAVENOUS | 0 refills
Start: 2020-12-20 — End: ?

## 2020-12-20 MED ORDER — MAGNESIUM OXIDE 400 MG (241.3 MG MAGNESIUM) PO TAB
400 mg | Freq: Once | ORAL | 0 refills | Status: CP
Start: 2020-12-20 — End: ?
  Administered 2020-12-20: 14:00:00 400 mg via ORAL

## 2020-12-20 MED ORDER — BUMETANIDE 0.25 MG/ML IJ SOLN
2-3 mg | INTRAVENOUS | 0 refills | Status: CP
Start: 2020-12-20 — End: ?
  Administered 2020-12-20 (×3): 2 mg via INTRAVENOUS

## 2020-12-20 MED ORDER — POTASSIUM CHLORIDE 10 MEQ PO TBTQ
10-60 meq | ORAL | 0 refills | PRN
Start: 2020-12-20 — End: ?

## 2020-12-20 MED ORDER — BUMETANIDE 0.25MG/ML IV DRIP (STD CONC)
2-3 mg/h | INTRAVENOUS | 0 refills | Status: DC
Start: 2020-12-20 — End: 2020-12-20
  Administered 2020-12-20: 14:00:00 2 mg/h via INTRAVENOUS

## 2020-12-20 MED FILL — VYNDAMAX 61 MG PO CAP: 61 mg | ORAL | 30 days supply | Qty: 30 | Fill #5 | Status: AC

## 2020-12-20 NOTE — Progress Notes
Diuretic Dosing Changes for HF Infusion Clinic      Instructions from Aura Dials, APRN to change patient's medication to the following:     Take 5mg  Metolazone on Friday 9/23  Take 2.5mg  Metolazone on Saturday and Sunday  Take an additional 35meq Potassium on Friday/Saturday/Sunday    Continue on above dose until Monday 9/26.

## 2020-12-20 NOTE — Progress Notes
Cardiology Heart Failure Infusion Clinic Subsequent Evaluation/Progress Note    Today's Date:  12/20/2020  Name: Ray [name]     MRN: [id number]     HPI:     Ray [name] is a 85 y.o. male. He was referred to HF Infusion Clinic by [name], APRN on 12/19/20 due to poor sleep, lower extremity edema, abdominal bloating, and weight gain.  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  ?    Ray [name] presents today to the Heart Failure IV Diuretic Infusion Clinic for second infusion of IV diuretics for symptomatic treatment of decompensated heart failure symptoms. He has had good   response to infusion therapy thus far.      Weight at initial infusion session on 12/19/20 160.8 lbs   Today's weight Wt Readings from Last 1 Encounters:   12/20/20 72.3 kg (159 lb 6.4 oz)      Initial Suspected goal dry weight 145-150 lbs   Adjusted goal dry weight 145-150 lbs       I was asked to see the patient at today's infusion session due to plan needing to be set for the upcoming days.    Currently he reports he is feeling better after just infusing yesterday. He reports  fatigue, paroxysmal nocturnal dyspnea, orthopnea, abdominal fullness, peripheral edema and weight change.  His abdominal bloating and lower extremity edema have improved from yesterday.  He denies lightheadedness, near syncope, palpitations and chest pain.      He reports taking medication as instructed. Current diuretic regimen includes bumex 6mg  twice a day with metolazone 5mg  po on Mondays, Wednesdays, and Fridays.      Assessment:     Wild-type ATTR amyloidosis:   -He has had a previous technetium pyrophosphate scan on 06/28/2019 that showed a 2-hour heart/contralateral lung ratio of 1.97 with visual semiquantitative grading grade 3, abnormal and highly suggestive of ATTR cardiac amyloidosis.   - He has had kappa 7.45, lambda 4.07 and free light chain ratio 1.83 on 07/25/2019  -He had serum electrophoresis that showed no paraprotein.   -See 6-minute walk noted above  -He has completed a KCC Q score that was 72.92 indicative of fair to good quality of life.    -He is on tafamidis 61 mg daily.    -He had a baseline CPET on 7/19 2021, will repeat this after he is recovered from his TAVR.  -He had repeat TC PYP on 06/11/2020, it showed 2-hour heart/contralateral lung ratio reduced to 1.7, visual semiquantitative grading scale remains at 3.    ?   Heart failure with preserved ejection fraction:  - He continues to appear hypervolemic on physical exam.  He is currently taking Bumex 6 mg twice daily and metolazone as instructed (recently increased to 5mg  on MWF).  He is also on Jardiance 10 mg daily, unable to use spironolactone due to low EGFR.    - His CardioMEMS reading today was 26, threshold is 20-24,  CardioMEMS in situ: -12/15/19 IMPLANT RHC:?  BP:?126/76 (99)  RA:?27  RV:?60/32  PA:?57/31 (42)  PCWP:?30  TPG:?12  PVR:?3.8  SVR:?1840.  CO (Fick):?3.1  CI (Fick):?1.7?  AVO2 Diff 7.18?  Daily CardioMEMS Readings  ?Goal PA Diastolic:? 22  PA Diastolic Thresholds:? 20-24  ?  ?  Severe aortic stenosis status post TAVR with Dr. Paris Lore in [place] on 7/27:   - He had CT chest and abdomen with contrast on 7/1 that showed thickening and calcification of the aortic valve with a normal caliber ascending thoracic aorta, several sub-5 mm nodules scattered throughout both lungs, cardiomegaly, coronary artery disease and pulmonary hypertension.    -He had postprocedure echo on 7/28 that showed prosthetic valve was normal, no stenosis or regurgitation.    ?  Severe mitral regurgitation that is noted on most recent echo, in the future he may need a MitraClip.  ?  CKD stage III:    - follows with Dr. Mercie Eon..  - creatinine has been labile recently, ranging from 2.0-3.3  - creatinine 9/21 at the start of infusion: 3.0  - 9/21: improved to 2.8  - last saw Dr. Mercie Eon on 12/13/20: she noted that he may require daily metolazone  ?  Atrial fibrillation: He continues to take amiodarone 200 mg p.o.daily, he is on oral anticoagulation with Xarelto 15 mg daily, he denies any upper or lower GI bleeding symptoms, however he does continue to have increased bruising since the addition of aspirin 81 mg daily per TAVR protocol..  He has been in atrial fibrillation since at least 2020.     Permanent pacemaker in situ: He underwent permanent pacemaker insertion with Dr. Milas Kocher on 9/1, final device evaluation on 9/2 showed normal function.  He had postoperative chest x-ray on 9/2 that showed small pleural effusions and improved edema, interval placement of dual-chamber pacemaker with leads terminating in the right atrium and right ventricle.          Plans  1. Continue Heart Failure Infusion Clinic Protocol and algorithm.   2. BMP  3. See above for estimated goal weight.    4. Potassium replacement per Potassium Replacement equation.    5. Magnesium oxide 400mg  PO       Discharge Recommendations:    1. He is not returning to infusion clinic tomorrow due to prior engagement.  2. Resume home bumex 6mg  po BID  3. For metolazone doses: Friday: 5mg  x 1, Saturday and Sunday: 2.5mg  x 1 each day  4. Extra of K+ each day, Friday-Sunday.  5. Daily home weight monitoring.  Bring weight log to all follow-up appointments.  6. 2000mg  sodium dietary restriction with 2L fluid restriction.   7. Based off of cardiomems pressures, weights, and symptoms, will decide Monday morning if he needs to come to infusion clinic.    I had a discussion with Ray [name] today about the goals of HF Infusion Clinic therapy and his concerns and questionswere answered in detail to the extent that Ray [name] understands and agrees with the medical plan. Total visit of 50 minutes with Ray [name] of which 40 minutes were dedicated to advanced HF counseling and care coordination.     Objective:      Visit Diagnosis:  Encounter Diagnoses   Name Primary?   ? Acute on chronic diastolic CHF (congestive heart failure), NYHA class 2 (HCC) Yes         Medications    Current Outpatient Medications on File Prior to Visit   Medication Sig Dispense Refill   ? acetaminophen (TYLENOL) 325 mg tablet Take two tablets by mouth every 6 hours as needed.  0   ? allopurinoL (ZYLOPRIM) 300 mg tablet Take 300 mg by mouth daily. Take with food.     ? amiodarone (CORDARONE) 200 mg  tablet Take one tablet by mouth daily. 90 tablet 1   ? aspirin 81 mg chewable tablet Chew one tablet by mouth daily. Take with food. 90 tablet 0   ? bumetanide (BUMEX) 2 mg tablet Take three tablets by mouth twice daily. 540 tablet 1   ? cholecalciferol (vitamin D3) (VITAMIN D3) 1,000 units tablet Take 1,000 Units by mouth daily.     ? docusate (COLACE) 100 mg capsule Take 100 mg by mouth twice daily.     ? empagliflozin (JARDIANCE) 10 mg tablet Take one tablet by mouth daily. 90 tablet 3   ? ferrous sulfate (FEOSOL) 325 mg (65 mg iron) tablet Take 325 mg by mouth twice daily. Take on an empty stomach at least 1 hour before or 2 hours after food.      ? fluticasone/salmeterol (ADVAIR) 100/50 mcg inhalation disk Inhale 1 puff by mouth into the lungs daily.     ? lactobacillus rhamnosus (GG) (CULTURELLE) 10 billion cell cap Take 1 capsule by mouth daily with breakfast.     ? magnesium oxide (MAG-OX) 400 mg (241.3 mg magnesium) tablet Take one tablet by mouth daily. 180 tablet 1   ? metOLazone (ZAROXOLYN) 2.5 mg tablet Take two tablets by mouth three times weekly. Take 5 mg on Monday, Wed, and Friday 75 tablet 3   ? omeprazole DR (PRILOSEC) 20 mg capsule Take one capsule by mouth daily before breakfast. 90 capsule 1   ? polyethylene glycol 3350 (MIRALAX) 17 g packet Take one packet by mouth as Needed. 100 packet 1 ? potassium chloride SR (KLOR-CON M20) 20 mEq tablet Take three tablets by mouth three times daily. Take additional 20 mEq on Metolazone days Mon-Wed-Fri. 282 tablet 3   ? rivaroxaban (XARELTO) 15 mg tablet Take one tablet by mouth daily with breakfast. Take with food. 30 tablet 11   ? rosuvastatin (CRESTOR) 5 mg tablet Take one tablet by mouth three times weekly. 45 tablet 3   ? senna (SENOKOT) 8.6 mg tablet Take 2 tablets by mouth twice daily as needed for Constipation.     ? sildenafiL (VIAGRA) 100 mg tablet TAKE 1 TABLET BY MOUTH ONCE DAILY AS NEEDED FOR SEXUAL ACTIVITY     ? tafamidis (VYNDAMAX) 61 mg capsule Take one capsule by mouth daily. 30 capsule 11   ? VENTOLIN HFA 90 mcg/actuation inhaler Inhale 1 puff by mouth into the lungs as Needed.       No current facility-administered medications on file prior to visit.           Vitals    Vitals:    12/20/20 0900   BP: (!) 159/48   Pulse: 73   Temp: 36.6 ?C (97.8 ?F)   SpO2: 99%     Wt Readings from Last 10 Encounters:   12/20/20 72.3 kg (159 lb 6.4 oz)   12/19/20 72.2 kg (159 lb 3.2 oz)   12/19/20 74.1 kg (163 lb 6.4 oz)   12/13/20 73.9 kg (163 lb)   12/07/20 70.8 kg (156 lb)   12/07/20 71.2 kg (157 lb)   12/07/20 70.8 kg (156 lb)   11/29/20 71.1 kg (156 lb 12.8 oz)   11/20/20 69.9 kg (154 lb)   10/31/20 70.8 kg (156 lb)             Last Echo Result:    Echo Results  (Last 3 results in the past 3 years)    Echo EF LVIDD LA Size IVS LVPW Rest PAP    (  12/07/20)   55    (12/07/20)   4.33    (12/07/20)   4.36    (12/07/20)   1.20    (12/07/20)   1.41    (12/07/20)   46       (10/25/20)   60    (10/25/20)   3.85    (10/25/20)   4.59    (10/25/20)   1.30    (10/25/20)   1.47    (10/25/20)   32       (08/07/20)   55    (10/11/20)   4.30    (10/11/20)   5.36    (10/11/20)   1.30    (10/11/20)   1.41    (10/11/20)   72               Medical History:   Diagnosis Date   ? Amyloidosis (HCC)     wild type   ? 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 Asthma    ? Chronic kidney disease    ? Essential hypertension 01/30/2009   ? History of atrial fibrillation    ? Hyperlipidemia 01/30/2009   ? Hypertension 01/30/2009   ? Nonrheumatic aortic valve stenosis 09/06/2020   ? Nonrheumatic mitral valve regurgitation 09/06/2020   ? Nonrheumatic tricuspid valve regurgitation 09/06/2020   ? Seasonal allergic reaction 01/30/2009   ? Skin cancer 01/30/2009     Surgical History:   Procedure Laterality Date   ? INTRACARDIAC CATHETER ABLATION WITH COMPREHENSIVE ELECTROPHYSIOLOGIC EVALUATION - ATRIAL FIBRILLATION N/A 05/12/2018    Performed by Jen Mow, MD at [facility] EP LAB   ? ATRIAL ABLATION SURGERY  2021   ? CATHETERIZATION RIGHT HEART WITH INSERTION PULMONARY ARTERY SENSOR N/A 12/15/2019    Performed by Harley Alto, MD at [facility] CATH LAB   ? ANGIOGRAPHY CORONARY ARTERY WITH LEFT HEART CATHETERIZATION N/A 09/17/2020    Performed by Harley Alto, MD at [facility] CATH LAB   ? POSSIBLE PERCUTANEOUS CORONARY STENT PLACEMENT WITH ANGIOPLASTY N/A 09/17/2020    Performed by Harley Alto, MD at [facility] CATH LAB   ? Transcatheter Aortic Valve Replacement - Femoral Artery N/A 10/24/2020    Performed by Arby Barrette, MD at [facility] CVOR   ? CATHETERIZATION RIGHT HEART N/A 11/26/2020    Performed by Hajj, Alfredo Martinez, MD at [facility] CATH LAB   ? ESOPHAGOGASTRODUODENOSCOPY WITH BIOPSY - FLEXIBLE N/A 11/28/2020    Performed by Samuel Jester, MD at [facility] ENDO   ? GASTROINTESTINAL TRACT IMAGING ESOPHAGUS THROUGH ILEUM - INTRALUMINAL N/A 11/28/2020    Performed by Samuel Jester, MD at [facility] ENDO   ? COLONOSCOPY WITH BIOPSY - FLEXIBLE N/A 11/28/2020    Performed by Samuel Jester, MD at [facility] ENDO   ? INSERTION/ REPLACEMENT PERMANENT PACEMAKER WITH ATRIAL AND VENTRICULAR LEAD Left 11/29/2020    Performed by Donnelly Stager, MD at [name] EP LAB   ? ACHILLES TENDON SURGERY Right    ? CARDIAC CATHERIZATION  6/202022   ? CARPAL TUNNEL RELEASE Bilateral    ? HX APPENDECTOMY     ? LUMBAR SPINE SURGERY     ? MOHS SURGERY     ? ROTATOR CUFF REPAIR Right Physical Exam:    General Appearance: In NAD.   Neck Veins: 8 cm JVP, neck veins are distended, + HJR   Chest Inspection: Left subclavian incision clean, dry and intact  Respiratory Effort: breathing comfortably, no respiratory distress   Auscultation/Percussion: lungs clear to  auscultation, no rales, rhonchi or wheezing   Cardiac Rhythm: irregularly irregular rhythm and normal rate   Cardiac Auscultation: S1, S2 normal, no rub, no definite S3  or S4   Murmurs: grade iii/vi systolic ejection murmur at left mid axillary line  Peripheral Circulation: normal peripheral circulation   Pedal Pulses: normal symmetric pedal pulses   Lower Extremity Edema: + 2 mm bilateral peripheral edema to the knees.  Venous discoloration, small oozing of blood at his knees.. .  Abdominal Exam: soft non-tender, no obvious masses, bowel sounds normal   Gait & Station: walks without assistance   Orientation: oriented to person, place and time   Affect & Mood: appropriate and sustained affect   Language and Memory: patient responsive and seems to comprehend information   Neurologic Exam: neurological assessment grossly intact   Vital signs were reviewed           Laboratory Review:     Most Recent Point of Care Results:  Sodium-POC   Date Value Ref Range Status   12/20/2020 135 (L) 137 - 147 MMOL/L Final     Potassium-POC   Date Value Ref Range Status   12/20/2020 3.5 3.5 - 5.1 MMOL/L Final     Chloride, POC   Date Value Ref Range Status   12/20/2020 93 (L) 98 - 110 MMOL/L Final     CO2, POC   Date Value Ref Range Status   12/20/2020 30 21 - 30 MMOL/L Final     Anion Gap, POC   Date Value Ref Range Status   12/20/2020 17 (H) 3 - 12 Final     Bun, POC   Date Value Ref Range Status   12/20/2020 89 (H) 7 - 25 MG/DL Final     Ionized Calcium-POC   Date Value Ref Range Status   12/20/2020 1.04 1.0 - 1.3 MMOL/L Final     Glucose, POC   Date Value Ref Range Status   12/20/2020 211 (H) 70 - 100 MG/DL Final   30/86/5784 696 (H) 70 - 100 MG/DL Final BNP POC   Date Value Ref Range Status   12/19/2020 2,760.0 (H) 0 - 100 PG/ML Final           Leonides Sake, APRN-NP  Pager: [phone]

## 2020-12-20 NOTE — Progress Notes
Daily CardioMEMS Readings    Goal PA Diastolic: 22 mmHg   PA Diastolic Thresholds: 123456 mmHg

## 2020-12-20 NOTE — Progress Notes
Summary for Heart Failure Infusion Clinic   Today's Date: 12/20/2020               Todays Goal: 2 L      Today you diuresed a total of 1250 ml     Total dose of IV Push: 6 mg of Bumex   400 mg of Magnesium   50 mEq of Potassium      IV Infusion dose was: 2 mg/hr x 3 hrs     Pre-infusion weight was: 159.4 lbs     Post-Infusion weight was: 156.4 lbs     Total Net Output:-890 ml       Post Infusion Vitals for today:                Blood Pressure:144/46              Heart Rate:68              Oxygen Saturation:100%       Patient Started on category: A    Patient ended on category: A

## 2020-12-20 NOTE — Progress Notes
Patient here for 2nd OP diuretic infusion. PAD 26 today, goal PAD 20-24. Labs and VSS. Per patient, he has some commitments tomorrow morning so would like to avoid coming to HF infusion clinic if at all possible. Aura Dials, APRN notified of above.     Marcene Brawn to come see patient.     Per Marcene Brawn, patient does not need to come back to infusion clinic tomorrow. Plan for patient to take 5mg  metolazone on Friday, and 2.5mg  metolazone on Saturday and Sunday with an additional 61meq potassium each day. Patient will submit cardiomems reading first thing Monday morning, and after review will decide if patient needs to return to infusion clinic. Patient updated on POC, v/u.

## 2020-12-21 ENCOUNTER — Encounter: Admit: 2020-12-21 | Discharge: 2020-12-21 | Payer: MEDICARE

## 2020-12-21 NOTE — Telephone Encounter
Readings sent to provider for review.     DAILY CardioMEMS READINGS    Goal PA Diastolic: 22 mmHg  PA Diastolic Thresholds: 20-24 mmHg          CARDIAC MEDS:    Home Medications    Medication Sig   acetaminophen (TYLENOL) 325 mg tablet Take two tablets by mouth every 6 hours as needed.   allopurinoL (ZYLOPRIM) 300 mg tablet Take 300 mg by mouth daily. Take with food.   amiodarone (CORDARONE) 200 mg tablet Take one tablet by mouth daily.   aspirin 81 mg chewable tablet Chew one tablet by mouth daily. Take with food.   bumetanide (BUMEX) 2 mg tablet Take three tablets by mouth twice daily.   cholecalciferol (vitamin D3) (VITAMIN D3) 1,000 units tablet Take 1,000 Units by mouth daily.   docusate (COLACE) 100 mg capsule Take 100 mg by mouth twice daily.   empagliflozin (JARDIANCE) 10 mg tablet Take one tablet by mouth daily.   ferrous sulfate (FEOSOL) 325 mg (65 mg iron) tablet Take 325 mg by mouth twice daily. Take on an empty stomach at least 1 hour before or 2 hours after food.    fluticasone/salmeterol (ADVAIR) 100/50 mcg inhalation disk Inhale 1 puff by mouth into the lungs daily.   lactobacillus rhamnosus (GG) (CULTURELLE) 10 billion cell cap Take 1 capsule by mouth daily with breakfast.   magnesium oxide (MAG-OX) 400 mg (241.3 mg magnesium) tablet Take one tablet by mouth daily.   metOLazone (ZAROXOLYN) 2.5 mg tablet Take two tablets by mouth three times weekly. Take 5 mg on Monday, Wed, and Friday   omeprazole DR (PRILOSEC) 20 mg capsule Take one capsule by mouth daily before breakfast.   polyethylene glycol 3350 (MIRALAX) 17 g packet Take one packet by mouth as Needed.   potassium chloride SR (KLOR-CON M20) 20 mEq tablet Take three tablets by mouth three times daily. Take additional 20 mEq on Metolazone days Mon-Wed-Fri.   rivaroxaban (XARELTO) 15 mg tablet Take one tablet by mouth daily with breakfast. Take with food.   rosuvastatin (CRESTOR) 5 mg tablet Take one tablet by mouth three times weekly.   senna (SENOKOT) 8.6 mg tablet Take 2 tablets by mouth twice daily as needed for Constipation.   sildenafiL (VIAGRA) 100 mg tablet TAKE 1 TABLET BY MOUTH ONCE DAILY AS NEEDED FOR SEXUAL ACTIVITY   tafamidis (VYNDAMAX) 61 mg capsule Take one capsule by mouth daily.   VENTOLIN HFA 90 mcg/actuation inhaler Inhale 1 puff by mouth into the lungs as Needed.       RECENT LABS:    Basic Metabolic Profile    Lab Results   Component Value Date/Time    NA 136 12/05/2020 12:00 AM    K 3.1 (L) 12/05/2020 12:00 AM    CA 9.5 12/05/2020 12:00 AM    CL 97 (L) 12/05/2020 12:00 AM    CO2 27 12/05/2020 12:00 AM    GAP 15 (H) 12/05/2020 12:00 AM    Lab Results   Component Value Date/Time    BUN 68 (H) 12/05/2020 12:00 AM    CR 2.8 (H) 12/20/2020 09:05 AM    CR 2.71 (H) 12/05/2020 12:00 AM    GLU 156 (H) 12/05/2020 12:00 AM          UPCOMING APPOINTMENTS:    Future Appointments   Date Time Provider Department Center   12/25/2020  3:30 PM Fredricka Bonine, APRN-NP [facility] C[facility] Exam   06/26/2021  3:00 PM [name],  MD [facility] IM

## 2020-12-24 ENCOUNTER — Encounter: Admit: 2020-12-24 | Discharge: 2020-12-24 | Payer: MEDICARE

## 2020-12-24 DIAGNOSIS — D649 Anemia, unspecified: Secondary | ICD-10-CM

## 2020-12-24 NOTE — Telephone Encounter
Reading sent to provider to review and advise.      DAILY CardioMEMS READINGS    Goal PA Diastolic: 22 mmHg  PA Diastolic Thresholds: 20-24 mmHg          CARDIAC MEDS:    Home Medications    Medication Sig   acetaminophen (TYLENOL) 325 mg tablet Take two tablets by mouth every 6 hours as needed.   allopurinoL (ZYLOPRIM) 300 mg tablet Take 300 mg by mouth daily. Take with food.   amiodarone (CORDARONE) 200 mg tablet Take one tablet by mouth daily.   aspirin 81 mg chewable tablet Chew one tablet by mouth daily. Take with food.   bumetanide (BUMEX) 2 mg tablet Take three tablets by mouth twice daily.   cholecalciferol (vitamin D3) (VITAMIN D3) 1,000 units tablet Take 1,000 Units by mouth daily.   docusate (COLACE) 100 mg capsule Take 100 mg by mouth twice daily.   empagliflozin (JARDIANCE) 10 mg tablet Take one tablet by mouth daily.   ferrous sulfate (FEOSOL) 325 mg (65 mg iron) tablet Take 325 mg by mouth twice daily. Take on an empty stomach at least 1 hour before or 2 hours after food.    fluticasone/salmeterol (ADVAIR) 100/50 mcg inhalation disk Inhale 1 puff by mouth into the lungs daily.   lactobacillus rhamnosus (GG) (CULTURELLE) 10 billion cell cap Take 1 capsule by mouth daily with breakfast.   magnesium oxide (MAG-OX) 400 mg (241.3 mg magnesium) tablet Take one tablet by mouth daily.   metOLazone (ZAROXOLYN) 2.5 mg tablet Take two tablets by mouth three times weekly. Take 5 mg on Monday, Wed, and Friday   omeprazole DR (PRILOSEC) 20 mg capsule Take one capsule by mouth daily before breakfast.   polyethylene glycol 3350 (MIRALAX) 17 g packet Take one packet by mouth as Needed.   potassium chloride SR (KLOR-CON M20) 20 mEq tablet Take three tablets by mouth three times daily. Take additional 20 mEq on Metolazone days Mon-Wed-Fri.   rivaroxaban (XARELTO) 15 mg tablet Take one tablet by mouth daily with breakfast. Take with food.   rosuvastatin (CRESTOR) 5 mg tablet Take one tablet by mouth three times weekly. senna (SENOKOT) 8.6 mg tablet Take 2 tablets by mouth twice daily as needed for Constipation.   sildenafiL (VIAGRA) 100 mg tablet TAKE 1 TABLET BY MOUTH ONCE DAILY AS NEEDED FOR SEXUAL ACTIVITY   tafamidis (VYNDAMAX) 61 mg capsule Take one capsule by mouth daily.   VENTOLIN HFA 90 mcg/actuation inhaler Inhale 1 puff by mouth into the lungs as Needed.       RECENT LABS:    Basic Metabolic Profile    Lab Results   Component Value Date/Time    NA 136 12/05/2020 12:00 AM    K 3.1 (L) 12/05/2020 12:00 AM    CA 9.5 12/05/2020 12:00 AM    CL 97 (L) 12/05/2020 12:00 AM    CO2 27 12/05/2020 12:00 AM    GAP 15 (H) 12/05/2020 12:00 AM    Lab Results   Component Value Date/Time    BUN 68 (H) 12/05/2020 12:00 AM    CR 2.8 (H) 12/20/2020 09:05 AM    CR 2.71 (H) 12/05/2020 12:00 AM    GLU 156 (H) 12/05/2020 12:00 AM          UPCOMING APPOINTMENTS:    Future Appointments   Date Time Provider Department Center   12/25/2020  3:30 PM Fredricka Bonine, APRN-NP [facility] CVM Exam   12/27/2020 12:00 AM [name]OTE [facility]ITORING  [facility] CVM Procedur   01/28/2021 12:00 AM [name] REMOTE MONITORING [facility] CVM Procedur   06/26/2021  3:00 PM [name], MD [facility] IM

## 2020-12-24 NOTE — Telephone Encounter
Spoke to pt and spouse who reports pt had significant bleed yesterday from the skin cancer on his nose that lasted 8.5 hrs requiring ER visit to [facility] and cauterization to stop the bleed. Pt on dual-antiplatelet therapy for Afib and recent TAVR. Pt reports the hospital did not draw any lab work but wife is afraid his HbG is low because he bled so long yesterday. Pt states he "feels really Ray [name] "except for my nose". Pt has bandage/packing on nose that was placed by ER physician and was told not to remove it until tomorrow. Pt denies any weight gain, increase in his SOA, DOE or Edema. Pt took medications as outline over the weekend with Metolazone 2.5mg  Saturday and Sunday and 5 mg this AM.  This Probation officer spoke with Ray Roys, APRN and Infusion Clinic RN. Per Provider patient is scheduled for Infusion Clinic tomorrow morning at 9am. Provider will see pt in infusion clinic so previously scheduled afternoon appt canceled as ordered. Pt and wife know to proceed to ER if heart failure symptoms exacerbate prior to infusion clinic appt tomorrow. Pt instructed to not go to Cardiac Rehab today or tomorrow. They are agreeable to plan.

## 2020-12-25 ENCOUNTER — Encounter: Admit: 2020-12-25 | Discharge: 2020-12-25 | Payer: MEDICARE

## 2020-12-25 ENCOUNTER — Ambulatory Visit: Admit: 2020-12-25 | Discharge: 2020-12-25 | Payer: MEDICARE

## 2020-12-25 DIAGNOSIS — I5032 Chronic diastolic (congestive) heart failure: Secondary | ICD-10-CM

## 2020-12-25 DIAGNOSIS — I5030 Unspecified diastolic (congestive) heart failure: Secondary | ICD-10-CM

## 2020-12-25 DIAGNOSIS — D649 Anemia, unspecified: Secondary | ICD-10-CM

## 2020-12-25 DIAGNOSIS — I5033 Acute on chronic diastolic (congestive) heart failure: Principal | ICD-10-CM

## 2020-12-25 MED ORDER — MAGNESIUM OXIDE 400 MG (241.3 MG MAGNESIUM) PO TAB
400 mg | Freq: Once | ORAL | 0 refills | Status: CN
Start: 2020-12-25 — End: ?

## 2020-12-25 MED ORDER — POTASSIUM CHLORIDE 10 MEQ PO TBTQ
10-60 meq | ORAL | 0 refills | Status: CN | PRN
Start: 2020-12-25 — End: ?

## 2020-12-25 MED ORDER — FUROSEMIDE 10 MG/ML IJ SOLN
80 mg | Freq: Once | INTRAVENOUS | 0 refills | Status: CP
Start: 2020-12-25 — End: ?
  Administered 2020-12-25: 15:00:00 80 mg via INTRAVENOUS

## 2020-12-25 MED ORDER — MAGNESIUM OXIDE 400 MG (241.3 MG MAGNESIUM) PO TAB
400 mg | Freq: Once | ORAL | 0 refills | Status: DC
Start: 2020-12-25 — End: 2020-12-25

## 2020-12-25 MED ORDER — POTASSIUM CHLORIDE 20 MEQ PO TBTQ
ORAL_TABLET | 3 refills | 30.00000 days | Status: AC
Start: 2020-12-25 — End: ?

## 2020-12-25 MED ORDER — METOLAZONE 2.5 MG PO TAB
ORAL_TABLET | Freq: Every day | ORAL | 3 refills | 84.00000 days | Status: AC
Start: 2020-12-25 — End: ?

## 2020-12-25 MED ORDER — BUMETANIDE 0.25 MG/ML IJ SOLN
2-3 mg | INTRAVENOUS | 0 refills | Status: AC
Start: 2020-12-25 — End: ?

## 2020-12-25 MED ORDER — BUMETANIDE 0.25MG/ML IV DRIP (STD CONC)
2-3 mg/h | INTRAVENOUS | 0 refills | Status: CN
Start: 2020-12-25 — End: ?

## 2020-12-25 MED ORDER — POTASSIUM CHLORIDE 10 MEQ PO TBTQ
10-60 meq | ORAL | 0 refills | Status: DC | PRN
Start: 2020-12-25 — End: 2020-12-25
  Administered 2020-12-25: 15:00:00 40 meq via ORAL

## 2020-12-25 MED ORDER — BUMETANIDE 0.25 MG/ML IJ SOLN
2-3 mg | INTRAVENOUS | 0 refills | Status: CN
Start: 2020-12-25 — End: ?

## 2020-12-25 MED ORDER — BUMETANIDE 0.25MG/ML IV DRIP (STD CONC)
2-3 mg/h | INTRAVENOUS | 0 refills | Status: DC
Start: 2020-12-25 — End: 2020-12-25

## 2020-12-25 NOTE — Progress Notes
Daily CardioMEMS Readings    Goal PA Diastolic: 22 mmHg   PA Diastolic Thresholds: 76-81 mmHg  Notified Infusion Nurses and [name], APRN

## 2020-12-25 NOTE — Progress Notes
Patient presents for 3rd bumex infusion.  Potassium level today is 3.3 and creatinine is 3.2 (it was 3.0 on first infusion and 2.8 on 9/22). PAD today is 75 and goal is 20-24. [name], APRN, called to come assess patient.     Orders as follows per Hinton Dyer:  1. No infusion today, done with infusions.   2. Give lasix 80mg  IV once now with potassium 42meq PO.    3. Labs on 9/30 - cardiomems team to send standing order to [facility] lab.    4. Metolazone: Take 2.5 mg every day.  Take 5 mg total  on Monday, Wed, and Friday.  5. Potassium: Take 80 meq am, 60 meq noon and 60 meq evening    Reviewed instructions and AVS with patient who verbalized understanding.

## 2020-12-25 NOTE — Progress Notes
Cardiology Heart Failure Infusion Clinic Subsequent Evaluation/Progress Note    Today's Date:  12/25/2020  Name: Ray [name]     MRN: [id number]     HPI:     Ray [name] is a 85 y.o. male. He was referred to HF Infusion Clinic by [name], APRN on 12/19/20 due to poor sleep, lower extremity edema, abdominal bloating, and weight gain.  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  ?    Ray [name] presents today to the Heart Failure IV Diuretic Infusion Clinic for second infusion of IV diuretics for symptomatic treatment of decompensated heart failure symptoms. He has had fair  response to infusion therapy thus far.      Weight at initial infusion session on 12/19/20 160.8 lbs   Today's weight Wt Readings from Last 1 Encounters:   12/25/20 72.3 kg (159 lb 6.4 oz)      Initial Suspected goal dry weight 145-150 lbs, based on Cardiomems readings.     Currently he reports he is feeling better after just infusing yesterday. He reports  fatigue, paroxysmal nocturnal dyspnea, orthopnea, abdominal fullness, peripheral edema and weight change.  His abdominal bloating and lower extremity edema have improved from yesterday.  He denies lightheadedness, near syncope, palpitations and chest pain.      He reports taking medication as instructed. Current diuretic regimen includes bumex 6mg  twice a day with metolazone 5mg  po on Mondays, Wednesdays, and Fridays and 2.5 mg on Saturday and Sunday. He went to ER in [place] this weekend due to a bloody nose that wasn't controlled with compression for 6 hours.      Assessment:   Wild-type ATTR amyloidosis:   -He has had a previous technetium pyrophosphate scan on 06/28/2019 that showed a 2-hour heart/contralateral lung ratio of 1.97 with visual semiquantitative grading grade 3, abnormal and highly suggestive of ATTR cardiac amyloidosis.   - He has had kappa 7.45, lambda 4.07 and free light chain ratio 1.83 on 07/25/2019  -He had serum electrophoresis that showed no paraprotein.   -He has completed a KCC Q score that was 72.92 indicative of fair to good quality of life.    -He is on tafamidis 61 mg daily.    -He had a baseline CPET on 7/19 2021, will repeat this after he is recovered from his TAVR.  -He had repeat TC PYP on 06/11/2020, it showed 2-hour heart/contralateral lung ratio reduced to 1.7, visual semiquantitative grading scale remains at 3.    ?   Heart failure with preserved ejection fraction:  - He continues to appear hypervolemic on physical exam.  He is currently taking Bumex 6 mg twice daily and metolazone as instructed (recently increased to 5mg  on MWF).  He is also on Jardiance 10 mg daily, unable to use spironolactone due to low EGFR.    - His CardioMEMS reading today was 27, threshold is 20-24,  CardioMEMS in situ: -12/15/19 IMPLANT RHC:?  BP:?126/76 (99)  RA:?27  RV:?60/32  PA:?57/31 (42)  PCWP:?30  TPG:?12  PVR:?3.8  SVR:?1840.  CO (Fick):?3.1  CI (Fick):?1.7?  AVO2 Diff 7.18?  Daily CardioMEMS Readings  ?Goal PA Diastolic:? 22  PA Diastolic Thresholds:? 20-24    Given his creatinine was 3.2 this morning, above infusion center protocol for  infusion.  I discussed with Mr. Herston, he doesn't want to be admitted.  I discussed with Dr. Herma Carson. [name] , his attending, and will plan to give him metolazone 2.5 mg daily with 5 mg on MWF.  Will increase his potassium to 80 meq am, 60 meq noon and 60 meq pm.  He was given 80 mg IV lasix in infusion clinic along with correction factor potassium of 40 meq.  He will get repeat chemistry on Friday at [place].  I discussed with Rozanna Boer, Cardiomems coordinator.  Will ask for 6 week follow up with Dr. [name].  If his mems readings are still above target next week may need admission for continuous IV diuretic infusion.  ?CKD stage III:    - follows with Dr. Mercie Eon..  - creatinine has been labile recently, ranging from 2.0-3.3  - creatinine 9/21 at the start of infusion: 3.0  - 9/27: creat 3.2 today.  - last saw Dr. Mercie Eon on 12/13/20: she noted that he may require daily metolazone  Severe aortic stenosis status post TAVR with Dr. Paris Lore in [place] on 10/24/2020:   - He had CT chest and abdomen with contrast on 7/1 that showed thickening and calcification of the aortic valve with a normal caliber ascending thoracic aorta, several sub-5 mm nodules scattered throughout both lungs, cardiomegaly, coronary artery disease and pulmonary hypertension.    -He had postprocedure echo on 7/28 that showed prosthetic valve was normal, no stenosis or regurgitation  -Given that he has had 2 significant bleeds (GI and nose bleed) that has sent him to the ER, I had sent a message to [facility] APRN, she is OK with discontinuing his PTA asa.  .    Severe mitral regurgitation that is noted on most recent echo, in the future he may need a MitraClip.  ??  Atrial fibrillation: He continues to take amiodarone 200 mg p.o.daily, he is on oral anticoagulation with Xarelto 15 mg daily.  He is interested in having a Watchman procedure, will defer discussion to Dr. [name] appointment.  Permanent pacemaker in situ: He underwent permanent pacemaker insertion with Dr. Milas Kocher on 9/1, final device evaluation on 9/2 showed normal function.  He had postoperative chest x-ray on 9/2 that showed small pleural effusions and improved edema, interval placement of dual-chamber pacemaker with leads terminating in the right atrium and right ventricle.          Plans  1. Lasix 80 mg IV once  2. Kdur 40 meq once  3. Increase home metolazone to 2.5 mg daily with total 5 mg on MWF  4. Increase home kdur to 80 meq am, 60 meq noon and 60 meq pm,  5. Chemistry on Friday  6. Will continue to monitor Cardiomems readings.  7. Follow up appointment with Dr. [name] in 6 weeks, if cardiomems readings remain elevated next week, may need admission.      Discharge Recommendations:    1. He is not returning to infusion clinic tomorrow due to prior engagement.  2. Resume home bumex 6mg  po BID  3. For metolazone doses: Friday: 5mg  x 1, Saturday and Sunday: 2.5mg  x 1 each day  4. Extra of K+ each day, Friday-Sunday.  5. Daily home weight monitoring.  Bring weight log to all follow-up appointments.  6. 2000mg  sodium dietary restriction with 2L fluid restriction.   7. Based off of cardiomems pressures, weights, and symptoms, will decide Monday morning if he needs to come to infusion clinic.  I had a discussion with [name] today about the goals of HF Infusion Clinic therapy and his concerns and questionswere answered in detail to the extent that [name] understands and agrees with the medical plan. Total visit of 60 minutes with [name] of which 40 minutes were dedicated to advanced HF counseling and care coordination.     Objective:      Visit Diagnosis:  Encounter Diagnoses   Name Primary?   ? Acute on chronic diastolic CHF (congestive heart failure), NYHA class 2 (HCC) Yes   ? Anemia, unspecified    ? Acute on chronic heart failure with preserved ejection fraction (HCC)    ? Heart failure with preserved ejection fraction, unspecified HF chronicity (HCC)          Medications    Current Outpatient Medications on File Prior to Visit   Medication Sig Dispense Refill   ? acetaminophen (TYLENOL) 325 mg tablet Take two tablets by mouth every 6 hours as needed.  0   ? allopurinoL (ZYLOPRIM) 300 mg tablet Take 300 mg by mouth daily. Take with food.     ? amiodarone (CORDARONE) 200 mg tablet Take one tablet by mouth daily. 90 tablet 1   ? bumetanide (BUMEX) 2 mg tablet Take three tablets by mouth twice daily. 540 tablet 1   ? cholecalciferol (vitamin D3) (VITAMIN D3) 1,000 units tablet Take 1,000 Units by mouth daily.     ? docusate (COLACE) 100 mg capsule Take 100 mg by mouth twice daily.     ? empagliflozin (JARDIANCE) 10 mg tablet Take one tablet by mouth daily. 90 tablet 3   ? ferrous sulfate (FEOSOL) 325 mg (65 mg iron) tablet Take 325 mg by mouth twice daily. Take on an empty stomach at least 1 hour before or 2 hours after food.      ? fluticasone/salmeterol (ADVAIR) 100/50 mcg inhalation disk Inhale 1 puff by mouth into the lungs daily.     ? lactobacillus rhamnosus (GG) (CULTURELLE) 10 billion cell cap Take 1 capsule by mouth daily with breakfast.     ? magnesium oxide (MAG-OX) 400 mg (241.3 mg magnesium) tablet Take one tablet by mouth daily. 180 tablet 1   ? omeprazole DR (PRILOSEC) 20 mg capsule Take one capsule by mouth daily before breakfast. 90 capsule 1   ? polyethylene glycol 3350 (MIRALAX) 17 g packet Take one packet by mouth as Needed. 100 packet 1   ? rivaroxaban (XARELTO) 15 mg tablet Take one tablet by mouth daily with breakfast. Take with food. 30 tablet 11   ? rosuvastatin (CRESTOR) 5 mg tablet Take one tablet by mouth three times weekly. 45 tablet 3   ? senna (SENOKOT) 8.6 mg tablet Take 2 tablets by mouth twice daily as needed for Constipation.     ? sildenafiL (VIAGRA) 100 mg tablet TAKE 1 TABLET BY MOUTH ONCE DAILY AS NEEDED FOR SEXUAL ACTIVITY     ? tafamidis (VYNDAMAX) 61 mg capsule Take one capsule by mouth daily. 30 capsule 11   ? VENTOLIN HFA 90 mcg/actuation inhaler Inhale 1 puff by mouth into the lungs as Needed.       No current facility-administered medications on file prior to visit.           Vitals    Vitals:    12/25/20 0919   BP: 139/48   Pulse: 72   Temp: 36.6 ?C (97.9 ?F)   SpO2: 100%     Wt  Readings from Last 10 Encounters:   12/25/20 72.3 kg (159 lb 6.4 oz)   12/20/20 70.9 kg (156 lb 6.4 oz)   12/19/20 72.2 kg (159 lb 3.2 oz)   12/19/20 74.1 kg (163 lb 6.4 oz)   12/13/20 73.9 kg (163 lb)   12/07/20 70.8 kg (156 lb)   12/07/20 71.2 kg (157 lb)   12/07/20 70.8 kg (156 lb)   11/29/20 71.1 kg (156 lb 12.8 oz)   11/20/20 69.9 kg (154 lb)             Last Echo Result:    Echo Results  (Last 3 results in the past 3 years)    Echo EF LVIDD LA Size IVS LVPW Rest PAP    (12/07/20)   55    (12/07/20)   4.33    (12/07/20)   4.36    (12/07/20)   1.20    (12/07/20)   1.41    (12/07/20)   46       (10/25/20)   60    (10/25/20)   3.85    (10/25/20)   4.59    (10/25/20)   1.30    (10/25/20)   1.47    (10/25/20)   32       (08/07/20)   55    (10/11/20)   4.30    (10/11/20)   5.36    (10/11/20)   1.30    (10/11/20)   1.41    (10/11/20)   72               Medical History:   Diagnosis Date   ? Amyloidosis (HCC)     wild type   ? 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 Asthma    ? Chronic kidney disease    ? Essential hypertension 01/30/2009   ? History of atrial fibrillation    ? Hyperlipidemia 01/30/2009   ? Hypertension 01/30/2009   ? Nonrheumatic aortic valve stenosis 09/06/2020   ? Nonrheumatic mitral valve regurgitation 09/06/2020   ? Nonrheumatic tricuspid valve regurgitation 09/06/2020   ? Seasonal allergic reaction 01/30/2009   ? Skin cancer 01/30/2009     Surgical History:   Procedure Laterality Date   ? INTRACARDIAC CATHETER ABLATION WITH COMPREHENSIVE ELECTROPHYSIOLOGIC EVALUATION - ATRIAL FIBRILLATION N/A 05/12/2018    Performed by Jen Mow, MD at [facility] EP LAB   ? ATRIAL ABLATION SURGERY  2021   ? CATHETERIZATION RIGHT HEART WITH INSERTION PULMONARY ARTERY SENSOR N/A 12/15/2019    Performed by Harley Alto, MD at [facility] CATH LAB   ? ANGIOGRAPHY CORONARY ARTERY WITH LEFT HEART CATHETERIZATION N/A 09/17/2020    Performed by Harley Alto, MD at [facility] CATH LAB   ? POSSIBLE PERCUTANEOUS CORONARY STENT PLACEMENT WITH ANGIOPLASTY N/A 09/17/2020    Performed by Harley Alto, MD at [facility] CATH LAB   ? Transcatheter Aortic Valve Replacement - Femoral Artery N/A 10/24/2020    [facility] by [facility] Barrette[facility] CVOR   ? CATHETERIZATION RIGHT HEART N/A 11/26/2020    Performed by Hajj, Alfredo Martinez, MD at [facility] CATH LAB   ? ESOPHAGOGASTRODUODENOSCOPY WITH BIOPSY - FLEXIBLE N/A 11/28/2020    Performed by Samuel Jester, MD at [facility] ENDO   ? GASTROINTESTINAL TRACT IMAGING ESOPHAGUS THROUGH ILEUM - INTRALUMINAL N/A 11/28/2020    Performed by Samuel Jester, MD at [facility] ENDO   ? COLONOSCOPY WITH BIOPSY - FLEXIBLE N/A 11/28/2020    Performed by Samuel Jester, MD  at [facility] ENDO   ? INSERTION/ REPLACEMENT PERMANENT PACEMAKER WITH ATRIAL AND VENTRICULAR LEAD Left 11/29/2020    Performed by Donnelly Stager, MD at [facility] EP LAB   ? ACHILLES TENDON SURGERY Right    ? CARDIAC CATHERIZATION  6/202022   ? CARPAL TUNNEL RELEASE Bilateral    ? HX APPENDECTOMY     ? LUMBAR SPINE SURGERY     ? MOHS SURGERY     ? ROTATOR CUFF REPAIR Right          Physical Exam:    General Appearance: In NAD.   Neck Veins: 8 cm JVP, neck veins are distended, + HJR   Chest Inspection: Left subclavian incision clean, dry and intact  Respiratory Effort: breathing comfortably, no respiratory distress   Auscultation/Percussion: lungs clear to auscultation, no rales, rhonchi or wheezing   Cardiac Rhythm: irregularly irregular rhythm and normal rate   Cardiac Auscultation: S1, S2 normal, no rub, no definite S3  or S4   Murmurs: grade iii/vi systolic ejection murmur at left mid axillary line  Peripheral Circulation: normal peripheral circulation   Pedal Pulses: normal symmetric pedal pulses   Lower Extremity Edema: + 2 mm bilateral peripheral edema to the mid shins  Venous discoloration, small oozing of blood at his knees.  Abdominal Exam: soft non-tender, no obvious masses, bowel sounds normal   Gait & Station: walks without assistance   Orientation: oriented to person, place and time   Affect & Mood: appropriate and sustained affect   Language and Memory: patient responsive and seems to comprehend information   Neurologic Exam: neurological assessment grossly intact   Vital signs were reviewed  NOSE: band-aid applied, dry         Laboratory Review:     Most Recent Point of Care Results:  Sodium-POC   Date Value Ref Range Status   12/25/2020 133 (L) 137 - 147 MMOL/L Final     Potassium-POC   Date Value Ref Range Status   12/25/2020 3.3 (L) 3.5 - 5.1 MMOL/L Final     Chloride, POC   Date Value Ref Range Status   12/25/2020 91 (L) 98 - 110 MMOL/L Final     CO2, POC   Date Value Ref Range Status   12/25/2020 32 (H) 21 - 30 MMOL/L Final     Anion Gap, POC   Date Value Ref Range Status   12/25/2020 14 (H) 3 - 12 Final     Bun, POC   Date Value Ref Range Status   12/25/2020 109 (H) 7 - 25 MG/DL Final     Ionized Calcium-POC   Date Value Ref Range Status   12/25/2020 1.08 1.0 - 1.3 MMOL/L Final     Glucose, POC   Date Value Ref Range Status   12/25/2020 219 (H) 70 - 100 MG/DL Final   40/34/7425 956 (H) 70 - 100 MG/DL Final     BNP POC   Date Value Ref Range Status   12/19/2020 2,760.0 (H) 0 - 100 PG/ML Final           Fredricka Bonine, APRN-NP  Pager: [phone]

## 2020-12-25 NOTE — Telephone Encounter
Call to pt to advise DM asking for 6wk appt for pt w [facility], wife verb understanding appt made 11/6.22@ 1100 with [facility] @ [place].  Wife states pt unable to receive bumex infusion today @ HF infusion center,  creat too high.  Wife states DM aware she saw pt tin infusion center

## 2020-12-26 ENCOUNTER — Encounter: Admit: 2020-12-26 | Discharge: 2020-12-26 | Payer: MEDICARE

## 2020-12-27 ENCOUNTER — Ambulatory Visit: Admit: 2020-12-27 | Discharge: 2020-12-27 | Payer: MEDICARE

## 2020-12-28 ENCOUNTER — Encounter: Admit: 2020-12-28 | Discharge: 2020-12-28 | Payer: MEDICARE

## 2020-12-28 DIAGNOSIS — I5033 Acute on chronic diastolic (congestive) heart failure: Secondary | ICD-10-CM

## 2020-12-28 DIAGNOSIS — I5032 Chronic diastolic (congestive) heart failure: Secondary | ICD-10-CM

## 2020-12-28 LAB — BASIC METABOLIC PANEL
BLD UREA NITROGEN: 107 (ref 8.4–25.7)
CHLORIDE: 89 — ABNORMAL LOW (ref 98–107)
CO2: 29 (ref 23–31)
GFR ESTIMATED: 20 (ref >59)
POTASSIUM: 3.3 — ABNORMAL LOW (ref 3.5–5.1)
SODIUM: 133 — ABNORMAL LOW (ref 136–145)

## 2020-12-28 MED ORDER — METOLAZONE 2.5 MG PO TAB
2.5 mg | ORAL_TABLET | Freq: Every day | ORAL | 3 refills | 84.00000 days | Status: AC
Start: 2020-12-28 — End: ?

## 2020-12-31 ENCOUNTER — Encounter: Admit: 2020-12-31 | Discharge: 2020-12-31 | Payer: MEDICARE

## 2020-12-31 DIAGNOSIS — I5032 Chronic diastolic (congestive) heart failure: Secondary | ICD-10-CM

## 2020-12-31 DIAGNOSIS — I5030 Unspecified diastolic (congestive) heart failure: Secondary | ICD-10-CM

## 2020-12-31 DIAGNOSIS — I5033 Acute on chronic diastolic (congestive) heart failure: Secondary | ICD-10-CM

## 2020-12-31 LAB — BASIC METABOLIC PANEL
ANION GAP: 15 (ref 8–16)
BLD UREA NITROGEN: 109 — ABNORMAL HIGH (ref 8–16)
CALCIUM: 9.7 (ref 8.8–10)
CHLORIDE: 94 — ABNORMAL LOW (ref 98–107)
CO2: 27 (ref 23–31)
CREATININE: 2.5 — ABNORMAL HIGH (ref 0.72–1.25)
GFR ESTIMATED: 17 (ref >59)
GLUCOSE,PANEL: 234 — ABNORMAL HIGH (ref 70–105)
POTASSIUM: 4 (ref 3.5–5.1)
SODIUM: 136 (ref 136–145)

## 2020-12-31 MED ORDER — POTASSIUM CHLORIDE 20 MEQ PO TBTQ
60 meq | ORAL_TABLET | Freq: Three times a day (TID) | ORAL | 3 refills | 30.00000 days | Status: AC
Start: 2020-12-31 — End: ?

## 2020-12-31 MED ORDER — METOLAZONE 2.5 MG PO TAB
ORAL_TABLET | Freq: Every day | ORAL | 3 refills | 84.00000 days | Status: AC
Start: 2020-12-31 — End: ?

## 2020-12-31 NOTE — Telephone Encounter
Labs and PADs reviewed with [name], APRN: Patient to take metolazone 2.5 mg Monday through Friday, 1.25 mg on Saturday and Sunday, potassium 60 meq TID, have labs checked in one week. Patient's wife notified, states understanding.

## 2021-01-08 ENCOUNTER — Encounter: Admit: 2021-01-08 | Discharge: 2021-01-08 | Payer: MEDICARE

## 2021-01-08 DIAGNOSIS — I5032 Chronic diastolic (congestive) heart failure: Secondary | ICD-10-CM

## 2021-01-08 DIAGNOSIS — I5033 Acute on chronic diastolic (congestive) heart failure: Secondary | ICD-10-CM

## 2021-01-08 LAB — BASIC METABOLIC PANEL
ANION GAP: 19 — ABNORMAL HIGH (ref 8–16)
BLD UREA NITROGEN: 112 — ABNORMAL HIGH (ref 8.4–25.7)
CALCIUM: 9.5 (ref 8.8–10.0)
CHLORIDE: 92 — ABNORMAL LOW (ref 98–107)
CO2: 25 (ref 23–31)
CREATININE: 2.7 — ABNORMAL HIGH (ref 0.72–1.25)
GFR ESTIMATED: 23 (ref >59)
GLUCOSE,PANEL: 90 (ref 70–105)
POTASSIUM: 3.5 (ref 3.5–5.1)
SODIUM: 136 (ref 136–145)

## 2021-01-08 MED ORDER — METOLAZONE 2.5 MG PO TAB
ORAL_TABLET | Freq: Every day | ORAL | 3 refills | 84.00000 days | Status: AC
Start: 2021-01-08 — End: ?

## 2021-01-09 ENCOUNTER — Encounter: Admit: 2021-01-09 | Discharge: 2021-01-09 | Payer: MEDICARE

## 2021-01-14 ENCOUNTER — Encounter: Admit: 2021-01-14 | Discharge: 2021-01-14 | Payer: MEDICARE

## 2021-01-14 NOTE — Progress Notes
URGENT!  URGENT!  URGENT!      To: [facility]  Fax: [phone]     RE: Ray [name]  DOB: Aug 16, 1935  MRN: [id number]    Medical Records request for continuity of care.  Ray [name] is scheduled for an appointment on 02/06/21 with Dr. Aida Puffer in the [place] Failure clinic at [facility] [address] [facility].    Please fax records to [phone]   [facility]  [address] [place], [place] [address]  Ph. [phone] Fax. [phone]    Requested Records:   o Recent Labs within the last 2 weeks          CONFIDENTIALITY NOTICE  This facsimile transmission contains confidential information, some or all of which may be protected health information as defined by the [place] (HIPAA) Privacy Rule.  The information contained in this facsimile is intended only for the exclusive and confidential use of the designated recipient named above.  If you are not the designated recipient (or an employee or agent responsible for delivering this facsimile transmission to the designated recipient), you are hereby notified that any disclosure, dissemination, distribution or copying of this information is strictly prohibited and may be subject to legal restriction or sanction. If you have received this transmission in error, immediately notify the Privacy Officer at [phone] to arrange for the return or destruction of the information and all copies.    [place], [place] - Phone [phone] - Fax [phone] - [email or web address]

## 2021-01-15 ENCOUNTER — Encounter: Admit: 2021-01-15 | Discharge: 2021-01-15 | Payer: MEDICARE

## 2021-01-15 DIAGNOSIS — I5030 Unspecified diastolic (congestive) heart failure: Secondary | ICD-10-CM

## 2021-01-15 DIAGNOSIS — I5032 Chronic diastolic (congestive) heart failure: Secondary | ICD-10-CM

## 2021-01-15 LAB — BASIC METABOLIC PANEL
ANION GAP: 13 (ref 8–16)
BLD UREA NITROGEN: 106 — ABNORMAL HIGH (ref 8.4–25.7)
BUN/CREATININE RATIO: 35 — ABNORMAL HIGH (ref 10–20)
CALCIUM: 9.6 (ref 8.8–10.0)
CHLORIDE: 91 — ABNORMAL LOW (ref 98–107)
CO2: 28 (ref 23–31)
CREATININE: 3 — ABNORMAL HIGH (ref 0.72–1.25)
GFR ESTIMATED: 21 (ref >59)
GLUCOSE,PANEL: 170 — ABNORMAL HIGH (ref 70–105)
POTASSIUM: 5.2 — ABNORMAL HIGH (ref 3.5–5.1)
SODIUM: 132 — ABNORMAL LOW (ref 136–145)

## 2021-01-15 MED ORDER — POTASSIUM CHLORIDE 20 MEQ PO TBTQ
60 meq | ORAL_TABLET | Freq: Three times a day (TID) | ORAL | 3 refills | 30.00000 days | Status: AC
Start: 2021-01-15 — End: ?

## 2021-01-15 NOTE — Telephone Encounter
PADs and labs results reviewed with Aura Dials, APRN: Patient to reduce potassium to 60 meq TID, have labs rechecked in one week. Patient's wife notified, states understanding.

## 2021-01-17 ENCOUNTER — Encounter: Admit: 2021-01-17 | Discharge: 2021-01-17 | Payer: MEDICARE

## 2021-01-18 ENCOUNTER — Encounter: Admit: 2021-01-18 | Discharge: 2021-01-18 | Payer: MEDICARE

## 2021-01-18 MED FILL — VYNDAMAX 61 MG PO CAP: 61 mg | ORAL | 30 days supply | Qty: 30 | Fill #6 | Status: AC

## 2021-01-22 ENCOUNTER — Encounter: Admit: 2021-01-22 | Discharge: 2021-01-22 | Payer: MEDICARE

## 2021-01-22 NOTE — Progress Notes
Amiodarone Monitoring status as of 01/22/21:     Amiodarone monitoring complete.  Next amiodarone review is due in 150 days.    Most recent lab results  Lab Results   Component Value Date/Time    AST 24 12/05/2020 12:00 AM    ALT 13 12/05/2020 12:00 AM    TSH 2.53 09/21/2019 12:04 PM    FREET4R 1.08 05/28/2015 12:00 AM         Procedures  Last chest X-Ray: 11/30/20  Last PFT: 09/11/20  Last eye exam: unknown

## 2021-01-24 ENCOUNTER — Encounter: Admit: 2021-01-24 | Discharge: 2021-01-24 | Payer: MEDICARE

## 2021-01-24 NOTE — Telephone Encounter
Received notification that  [employer] Carelink has not been connected since 01/08/21. Patient was instructed to look at his/her transmitter to make sure that it is plugged into power and send a manual transmission to reconnect the transmitter. If he/she has any questions about how to send a transmission or if the transmitter does not appear to be working properly, they need to contact the device company directly. Patient was provided with that contact number. Requested the patient send [place] a MyChart message or contact our device nurses at [phone] to let us know after they have sent their transmission. LVM for pt to reconnect to app. BC     Note: Patient needs to send a manual remote interrogation to reestablish communication to his/her remote transmitter.

## 2021-01-28 ENCOUNTER — Ambulatory Visit: Admit: 2021-01-28 | Discharge: 2021-01-28 | Payer: MEDICARE

## 2021-01-28 ENCOUNTER — Encounter: Admit: 2021-01-28 | Discharge: 2021-01-28 | Payer: MEDICARE

## 2021-01-28 DIAGNOSIS — I5033 Acute on chronic diastolic (congestive) heart failure: Secondary | ICD-10-CM

## 2021-01-28 DIAGNOSIS — I5032 Chronic diastolic (congestive) heart failure: Secondary | ICD-10-CM

## 2021-01-28 MED ORDER — METOLAZONE 2.5 MG PO TAB
2.5 mg | ORAL_TABLET | ORAL | 1 refills | 84.00000 days | Status: AC
Start: 2021-01-28 — End: ?

## 2021-01-28 NOTE — Telephone Encounter
PC to pt for symptom check. He reports he just returned from [facility] last night. He slept like he usually does, denies PND, LE edema. No increase in SOA or DOE. Weight is up today 6 lbs from when he left a week ago to go on vacation. He is having blood drawn today at [place]. Informed pt I would discuss with provider and return call. Pt agreeable to plan.       DAILY CardioMEMS READINGS    Goal PA Diastolic: 22 mmHg  PA Diastolic Thresholds: 20-24 mmHg          CARDIAC MEDS:    Home Medications    Medication Sig   bumetanide (BUMEX) 2 mg tablet Take three tablets by mouth twice daily.   empagliflozin (JARDIANCE) 10 mg tablet Take one tablet by mouth daily.   fluticasone/salmeterol (ADVAIR) 100/50 mcg inhalation disk Inhale 1 puff by mouth into the lungs daily.   magnesium oxide (MAG-OX) 400 mg (241.3 mg magnesium) tablet Take one tablet by mouth daily.   metOLazone (ZAROXOLYN) 2.5 mg tablet Take Metolazone 1.25 mg (half tablet) daily.   potassium chloride SR (KLOR-CON M20) 20 mEq tablet Take three tablets by mouth three times daily.   rivaroxaban (XARELTO) 15 mg tablet Take one tablet by mouth daily with breakfast. Take with food.   rosuvastatin (CRESTOR) 5 mg tablet Take one tablet by mouth three times weekly.   tafamidis (VYNDAMAX) 61 mg capsule Take one capsule by mouth daily.       RECENT LABS:    Basic Metabolic Profile    Lab Results   Component Value Date/Time    NA 132 (L) 01/11/2021 12:00 AM    K 5.2 (H) 01/11/2021 12:00 AM    CA 9.6 01/11/2021 12:00 AM    CL 91 (L) 01/11/2021 12:00 AM    CO2 28.0 01/11/2021 12:00 AM    GAP 13 01/11/2021 12:00 AM    Lab Results   Component Value Date/Time    BUN 106.0 (H) 01/11/2021 12:00 AM    CR 3.01 (H) 01/11/2021 12:00 AM    GLU 170 (H) 01/11/2021 12:00 AM          UPCOMING APPOINTMENTS:    Future Appointments   Date Time Provider Department Center   02/06/2021 11:00 AM Vanetta Shawl I, MD [facility] CVM Exam   02/07/2021 11:00 AM Robyne Askew, MD [facility] IM 06/26/2021  3:00 PM [name], MD [facility] IM

## 2021-01-28 NOTE — Telephone Encounter
Called to discuss medication changes with Octavia Bruckner and spouse. Will increase metolazone per Dana's recommendation and is planning on seeing Dr. Manuella Ghazi 11/9. He does have outpatient MOHS procedure they are inquiring about but they will send question to HF team for follow up with Dr. Manuella Ghazi. No further questions at this time.

## 2021-01-28 NOTE — Telephone Encounter
Geronimo Running, APRN-NP  [name]  Caller: Unspecified (Today, 9:15 AM)  Please have Mr. Gargis increase his Metolazone to 2.5 mg on MWF, 1.25 mg on all other days. He already is scheduled to see. Dr. Manuella Ghazi on 11/9. Thanks. DM            Previous Messages      ----- Message -----   From: Lequita Halt, RN   Sent: 01/28/2021  9:33 AM CDT   To: Geronimo Running, APRN-NP     ----- Message from Lequita Halt, RN sent at 01/28/2021 9:33 AM CDT -----   [place] Shore, Mr. Lurie's weight is up as well as his PADs. Labs will be done today at [facility]. If you want to make any changes prior to lab results please let us know.   Thank you!   Manuela Schwartz

## 2021-01-29 ENCOUNTER — Encounter: Admit: 2021-01-29 | Discharge: 2021-01-29 | Payer: MEDICARE

## 2021-01-29 DIAGNOSIS — I5033 Acute on chronic diastolic (congestive) heart failure: Secondary | ICD-10-CM

## 2021-01-29 LAB — BASIC METABOLIC PANEL
ANION GAP: 14 (ref 8–16)
BLD UREA NITROGEN: 110 — ABNORMAL HIGH (ref 8.4–25.7)
CALCIUM: 9.9 (ref 8.8–10)
CHLORIDE: 91 — ABNORMAL LOW (ref 98–107)
CO2: 30 (ref 23–31)
CREATININE: 2.8 — ABNORMAL HIGH (ref 0.72–1.25)
GFR ESTIMATED: 22 — ABNORMAL LOW (ref >59)
GLUCOSE,PANEL: 178 — ABNORMAL HIGH (ref 70–105)
POTASSIUM: 3.7 (ref 3.5–5.1)
SODIUM: 135 — ABNORMAL LOW (ref 136–145)

## 2021-01-29 NOTE — Progress Notes
The Prior Authorization for Vyndamax has been submitted for [employer] via Cover My Meds.  Will continue to follow.        The Prior Authorization for Vyndamax was approved for BH[employer]YA [employer]AND from 03/31/20 to 03/30/22.  The copay is $0.  The PA authorization number is [id number].      The medication will be delivered to patient's prescription address per the patient's request.            [place] Patient Advocate  [phone]

## 2021-01-30 ENCOUNTER — Encounter: Admit: 2021-01-30 | Discharge: 2021-01-30 | Payer: MEDICARE

## 2021-01-30 DIAGNOSIS — E8582 Wild-type transthyretin-related (ATTR) amyloidosis: Secondary | ICD-10-CM

## 2021-01-30 NOTE — Telephone Encounter
Call to pt to FU for appt with [facility] MV regurg and approval for MOHS, pt also needs CPET for amyloid management.      Cpet    WHEN: Nov 9  TIME: 1:00 pm    Return call to pt with appt info , pt verb understanding.

## 2021-01-31 ENCOUNTER — Encounter: Admit: 2021-01-31 | Discharge: 2021-01-31 | Payer: MEDICARE

## 2021-02-04 ENCOUNTER — Ambulatory Visit: Admit: 2021-02-04 | Discharge: 2021-02-04 | Payer: MEDICARE

## 2021-02-04 ENCOUNTER — Encounter: Admit: 2021-02-04 | Discharge: 2021-02-04 | Payer: MEDICARE

## 2021-02-04 DIAGNOSIS — I350 Nonrheumatic aortic (valve) stenosis: Secondary | ICD-10-CM

## 2021-02-04 DIAGNOSIS — C4490 Unspecified malignant neoplasm of skin, unspecified: Secondary | ICD-10-CM

## 2021-02-04 DIAGNOSIS — I4729 NSVT (nonsustained ventricular tachycardia): Secondary | ICD-10-CM

## 2021-02-04 DIAGNOSIS — R001 Bradycardia, unspecified: Secondary | ICD-10-CM

## 2021-02-04 DIAGNOSIS — J45909 Unspecified asthma, uncomplicated: Secondary | ICD-10-CM

## 2021-02-04 DIAGNOSIS — Z8679 Personal history of other diseases of the circulatory system: Secondary | ICD-10-CM

## 2021-02-04 DIAGNOSIS — I361 Nonrheumatic tricuspid (valve) insufficiency: Secondary | ICD-10-CM

## 2021-02-04 DIAGNOSIS — I10 Essential (primary) hypertension: Secondary | ICD-10-CM

## 2021-02-04 DIAGNOSIS — I340 Nonrheumatic mitral (valve) insufficiency: Secondary | ICD-10-CM

## 2021-02-04 DIAGNOSIS — I5033 Acute on chronic diastolic (congestive) heart failure: Principal | ICD-10-CM

## 2021-02-04 DIAGNOSIS — I420 Dilated cardiomyopathy: Secondary | ICD-10-CM

## 2021-02-04 DIAGNOSIS — E859 Amyloidosis, unspecified: Secondary | ICD-10-CM

## 2021-02-04 DIAGNOSIS — I5020 Unspecified systolic (congestive) heart failure: Secondary | ICD-10-CM

## 2021-02-04 DIAGNOSIS — I5032 Chronic diastolic (congestive) heart failure: Secondary | ICD-10-CM

## 2021-02-04 DIAGNOSIS — E785 Hyperlipidemia, unspecified: Secondary | ICD-10-CM

## 2021-02-04 DIAGNOSIS — N189 Chronic kidney disease, unspecified: Secondary | ICD-10-CM

## 2021-02-04 DIAGNOSIS — I4891 Unspecified atrial fibrillation: Secondary | ICD-10-CM

## 2021-02-04 DIAGNOSIS — I358 Other nonrheumatic aortic valve disorders: Secondary | ICD-10-CM

## 2021-02-04 DIAGNOSIS — J302 Other seasonal allergic rhinitis: Secondary | ICD-10-CM

## 2021-02-04 DIAGNOSIS — E8582 Wild-type transthyretin-related (ATTR) amyloidosis: Secondary | ICD-10-CM

## 2021-02-04 MED ORDER — METOLAZONE 5 MG PO TAB
5 mg | ORAL_TABLET | ORAL | 3 refills | 84.00000 days | Status: AC
Start: 2021-02-04 — End: ?

## 2021-02-04 MED ORDER — METOLAZONE 2.5 MG PO TAB
5 mg | ORAL_TABLET | ORAL | 1 refills | 84.00000 days | Status: DC
Start: 2021-02-04 — End: 2021-02-04

## 2021-02-04 NOTE — Telephone Encounter
Pharmacy called asking fro clarification on metolazone prescription, prescription currently reads:    Take two tablets by mouth three times weekly. Take Metolazone 2.5 mg on Monday, Wednesday, Friday. Take 1.25 mg on Sunday, Tuesday, Thursday and Saturday.

## 2021-02-04 NOTE — Progress Notes
Date of Service: 02/04/2021    Ray [name] is a 85 y.o. male.       HPI     I had the pleasure of seeing Ray [name], who is accompanied by his wife Ray [name] today in [place] [facility] clinic for amyloidosis follow-up.     ?  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  ?  I had increase the metolazone to 5 mg on Monday Wednesday Friday and ordered repeat labs including CBC, CMP and BNP.    PAD today  ?  Today Ray [name] reports that the swelling in his legs has continued, he has abdominal distention, early satiety, he did increase the metolazone as instructed.  He states he is not sleeping well, he does not urinate very much during the daytime and is urinating more in the evening.  He thinks he sleeping only 2 to 3 hours at night.  He has had bleeding of his left knee, his wife also notes that he has fine petechial bleeding on his back so much so that they need to apply Hx to the bedding.  He has restarted cardiac rehab and was able to do the NuStep, treadmill and recumbent bike for 5 minutes each.  He did go to [place] and had his labs drawn yesterday as instructed.  He is taking his cardiovascular medications as prescribed.  Home blood pressure was 104/54.    Vitals:    02/04/21 0830   BP: 132/52   BP Source: Arm, Left Upper   Pulse: 69   SpO2: 98%   O2 Device: None (Room air)   PainSc: Zero   Weight: 74.6 kg (164 lb 6.4 oz)   Height: 167.6 cm (5' 6)     Body mass index is 26.53 kg/m?[name]     Past Medical History  Patient Active Problem List    Diagnosis Date Noted   ? Iron deficiency anemia 11/27/2020   ? Heart failure (HCC) 11/26/2020   ? AKI (acute kidney injury) (HCC) 11/26/2020   ? S/P TAVR (transcatheter aortic valve replacement) 10/25/2020 ? Hypokalemia 10/25/2020   ? Mitral regurgitation 09/06/2020   ? Nonrheumatic tricuspid valve regurgitation 09/06/2020   ? Nonrheumatic aortic valve stenosis 09/06/2020   ? Asymptomatic microscopic hematuria 12/25/2019   ? S/P left pulmonary artery pressure sensor implant placement 12/15/2019     *Patient has CardioMEMS (Pulmonary Artery Pressure Sensor)* Please call [name], CardioMEMs Program Coordinator [phone] or page Heart Failure Rounding Team if patient is admitted or presents to ED*          ? Chronic heart failure with preserved ejection fraction (HCC) 12/15/2019   ? Acute on chronic diastolic CHF (congestive heart failure), NYHA class 2 (HCC) 11/14/2019   ? 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  ?  01/26/2019 Echo EF:?60%  IVS 1.50 cm (Range: 0.6 - 1.0)         LV PW 1.40 cm (Range: 0.6 - 1.0)         ?  1. Moderate concentric left ventricular hypertrophy  2. Unable to assess diastolic function due to atrial fibrillation  3. Moderate biatrial dilatation  4. Normal right ventricular size and systolic function  5. Normal central venous pressure  6.  Mild calcific aortic valve stenosis. ?Mean gradient is 16 mmHg with a peak velocity of 2.8 m/s. ?Trace aortic valve regurgitation is seen  7. Mitral valve is structurally okay. ?There is moderate central regurgitation. ?No stenosis  8. Moderate tricuspid valve regurgitation  9. Pulmonary artery static pressure measures at 33 mmHg   06/28/2019 TC PYP Multiview planar views and SPECT imaging confirms the presence of severe marked diffuse global left ventricular and right ventricular myocardial uptake of tracer. ?There is normal rib and bone uptake.  ?  Two hour heart/contralateral lung (H/CL) ratio: ?1.977?this metric is abnormal and highly suggestive of ATTR cardiac amyloidosis.?  ?  Visual Semi-Quantitative Grading Scale Analysis:?  The study is grade 3 indicating myocardial uptake greater than rib and bone uptake. ?The pattern is highly and strongly suggestive of ATTR cardiac amyloidosis.  ?  SUMMARY/OPINION:   This study is abnormal and strongly suggestive of ATTR cardiac amyloidosis. ?Left ventricular myocardial uptake is diffuse, intense, and global. ?There is also prominent right ventricular free wall uptake of tracer. ?The pattern is typical for ATTR cardiac amyloidosis   ? Cardiac MRI ?   07/25/2019 Kappa/Lambda FLC ?  ? Ref. Range 07/25/2019 15:48   Kappa, FLC Latest Ref Range: 0.33 - 1.94 MG/DL 1.61 (H)   Lambda, FLC Latest Ref Range: 0.57 - 2.63 MG/DL 0.96 (H)   Kappa/Lambda FLC Latest Ref Range: 0.26 - 1.65  1.83 (H)      07/25/2019 Serum Immunofixation ?NO PARAPROTEIN SEEN   cancelled Urine Immunofixation ?Patient unable to provide urine sample 4/26    07/25/2019 Genetic Testing ?Negative   ? Biopsy (Fat Pad, Cardiac, Bone Marrow) ?   ? GI Symptoms ?   ?+? 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   ?  ? Ref. Range 07/25/2019 15:48   B Type Natriuretic Peptide Latest Ref Range: 0 - 100 PG/ML 966.0 (H)   Troponin-I Latest Ref Range: 0.0 - 0.05 NG/ML 0.05   ?  Completed:  -Tafamidis start 08/2019. PA approved, pt approved for [facility] patient assistance from 09/01/2019 to 03/30/2020  -Baseline KCCQ12 completed 5/18/202, Summary Score 61.46  -Baseline 6 min walk completed 08/16/2019, distance walked 950 feet, 289.75 meters, duration of test 6 minutes  -Baseline CPET completed 10/17/2019       ? S/P ablation of atrial fibrillation 05/18/2018   ? Lightheadedness 03/16/2018   ? Hospitalization within last 30 days 12/24/2017   ? Atrial fibrillation with RVR (HCC) 12/14/2017   ? Chronic kidney disease, stage 4 (severe) (HCC) 12/13/2017   ? Junctional bradycardia 12/12/2017   ? Bilateral leg edema 12/11/2017   ? On amiodarone therapy 12/11/2017   ? Heart failure with preserved ejection fraction (HCC) 01/01/2016   ? NSVT (nonsustained ventricular tachycardia) 06/12/2015   ? Medication side effects 11/09/2014   ? Heart palpitations 09/19/2014   ? Longstanding persistent atrial fibrillation (HCC) 03/17/2013     03/26/2018 - ECHO:  Moderate concentric LVH seen. The EF is about 55%.  The atria are slightly dilated.  The RV function is mildly decreased.  Mild to moderate MR and TR seen.  Mild AI is present.  Based on the gradients and appearance, the AS appears to be mild. The mean gradient is 16 mm Hg with a peak velocity of 2.6 m/s.  03/26/2018 - Zio  Patch:  Predominant rhythm was normal sinus.  Two runs of ventricular tachycardia occurred, they were monomorphic, the longest run lasted 3 seconds, this episode occurred on April 08, 2018 at 9:41 PM.  One episode of AIVR also appeared to be present, it lasted approximately 4.6 seconds  Brief and rare episodes of SVT that probably represent atrial fibrillation, the longest lasting 6.3 seconds.  No evidence of atrial flutter and no evidence of high degree AV block.  05/12/2018 - LAAA:  Persistent Atrial Fibrillation status post successful pulmonary vein antral isolation.  Cavotricuspid Isthmus Ablation.  Ablation of Fractionated Intracardiac Electrograms.  Ablation for an Additional Discrete Arrhythmia-LA AFL after AFIB/PVI Ablation  06/23/2018 - Cardioversion:  Successful direct current cardioversion from symptomatic atrial fibrillation to sinus rhythm.  03/16/2019 - DCCV:  Successful direct current cardioversion from atrial fibrillation to sinus rhythm.     ? Systolic murmur of aorta 02/04/2013   ? SOB (shortness of breath) 02/04/2013   ? Carotid bruit present 01/23/2012   ? Overweight (BMI 25.0-29.9) 01/30/2011   ? Essential hypertension 01/30/2009   ? Hyperlipidemia 01/30/2009     Hyperlipidemia, on statin therapy     ? Skin cancer 01/30/2009   ? Seasonal allergic reaction 01/30/2009         Review of Systems   Constitutional: Negative.   HENT: Negative.    Eyes: Negative.    Cardiovascular: Positive for dyspnea on exertion.   Respiratory: Negative.    Endocrine: Negative.    Hematologic/Lymphatic: Negative.    Skin: Negative.    Musculoskeletal: Negative.    Gastrointestinal: Negative.    Genitourinary: Negative.    Neurological: Negative.    Psychiatric/Behavioral: Negative.    Allergic/Immunologic: Negative.        Physical Exam    General Appearance: In NAD.   Neck Veins: 7 cm JVP, neck veins are distended, + HJR   Chest Inspection: Left subclavian incision clean, dry and intact  Respiratory Effort: breathing comfortably, no respiratory distress   Auscultation/Percussion: lungs clear to auscultation, no rales, rhonchi or wheezing   Cardiac Rhythm: irregularly irregular rhythm and normal rate   Cardiac Auscultation: S1, S2 normal, no rub, no definite S3  or S4   Murmurs: grade iii/vi systolic ejection murmur at left mid axillary line  Peripheral Circulation: normal peripheral circulation   Pedal Pulses: normal symmetric pedal pulses   Lower Extremity Edema: + 2 mm bilateral peripheral edema to the knees.  Venous discoloration, small oozing of blood at his knees.. .  Abdominal Exam: soft non-tender, no obvious masses, bowel sounds normal   Gait & Station: walks without assistance   Orientation: oriented to person, place and time   Affect & Mood: appropriate and sustained affect   Language and Memory: patient responsive and seems to comprehend information   Neurologic Exam: neurological assessment grossly intact   Vital signs were reviewed  ?  ?  Cardiovascular Studies:    Echo 10/25/2020:Interpretation Summary   ? The left ventricle appears small. Moderate concentric hypertrophy. The visually estimated ejection fraction is 60%. The ejection fraction by Simpson's biplane method is 57%. There are no segmental wall motion abnormalities. Cannot determine left ventricular diastolic function.  ? Right Ventricle: The right ventricle is mildly dilated. The right ventricular systolic function is at the lower limit of normal. PASP is estimated at 32 mmHg.  ? Left Atrium: Severely dilated. Right Atrium: Severely dilated.  ? Mitral Valve: Normal valve structure. No stenosis. Severe regurgitation. The jet is centrally  directed.  ? Tricuspid Valve: Normal valve structure. No stenosis. Moderate to severe regurgitation.  ? Aortic Valve: There is a 35 mm S3 bioprosthetic valve present. The prosthetic valve is normal. No stenosis. No regurgitation.  ? The aortic root and ascending aorta are normal in size.  ? No pericardial effusion.    CHEST: 09/28/2020  1. Thickening and calcification of the aortic valve with a normal caliber   ascending thoracic aorta.   2. Development of several sub-5 mm nodules scattered throughout both   lungs. These are nonspecific though are most likely infectious or   inflammatory. In a low-risk patient no follow-up needed. In a high-risk   patient follow-up chest CT at 12 months is suggested. ?   3. Cardiomegaly and coronary artery disease   4. Pulmonary hypertension     ABDOMEN AND PELVIS:   1. Normal caliber abdominal aorta and common iliac arteries with moderate   diffuse atherosclerosis.   2. Moderate to severe focal stenosis at the origin of the SMA.   3. Moderate distal colonic diverticulosis. ?   ?  6 Minute Walk Last Results 06/26/2020 08/16/2019   SpO2:  Pre-Activity 99 99   Pulse:  POST-Activity 94 122   BP:  POST-Activity 126/76 151/76   SpO2:  POST-Activity 99 96   Distance Walked in Meters 152.5 289.75   ?            Cardiovascular Health Factors  Vitals BP Readings from Last 3 Encounters:   02/04/21 132/52   12/25/20 139/48   12/20/20 (!) 144/46     Wt Readings from Last 3 Encounters:   02/04/21 74.6 kg (164 lb 6.4 oz)   12/25/20 72.3 kg (159 lb 6.4 oz)   12/20/20 70.9 kg (156 lb 6.4 oz)     BMI Readings from Last 3 Encounters:   02/04/21 26.53 kg/m?   12/25/20 25.73 kg/m?   12/20/20 25.24 kg/m?      Smoking Social History     Tobacco Use   Smoking Status Never Smoker   Smokeless Tobacco Never Used      Lipid Profile Cholesterol   Date Value Ref Range Status   09/17/2020 145 <200 MG/DL Final     HDL   Date Value Ref Range Status   09/17/2020 64 >40 MG/DL Final     LDL   Date Value Ref Range Status   09/17/2020 72 <100 mg/dL Final     Triglycerides   Date Value Ref Range Status   09/17/2020 87 <150 MG/DL Final      Blood Sugar Hemoglobin A1C   Date Value Ref Range Status   10/22/2020 6.4 (H) 4.0 - 6.0 % Final     Comment:     The ADA recommends that most patients with type 1 and type 2 diabetes maintain   an A1c level <7%.       Glucose   Date Value Ref Range Status   01/28/2021 178 (H) 70 - 105 Final   01/11/2021 170 (H) 70 - 105 Final   01/07/2021 90 70 - 105 Final     Glucose, POC   Date Value Ref Range Status   12/25/2020 219 (H) 70 - 100 MG/DL Final   45/40/9811 914 (H) 70 - 100 MG/DL Final   78/29/5621 308 (H) 70 - 100 MG/DL Final   65/78/4696 295 (H) 70 - 100 MG/DL Final          Problems Addressed Today  No diagnosis found.  Assessment and Plan     Wild-type ATTR amyloidosis:   -He has had a previous technetium pyrophosphate scan on 06/28/2019 that showed a 2-hour heart/contralateral lung ratio of 1.97 with visual semiquantitative grading grade 3, abnormal and highly suggestive of ATTR cardiac amyloidosis.   - He has had kappa 7.45, lambda 4.07 and free light chain ratio 1.83 on 07/25/2019  -He had serum electrophoresis that showed no paraprotein.    ?   Heart failure with preserved ejection fraction:  - He appears hypervolemic on physical exam.  He is currently taking Bumex 6 mg twice daily and metolazone as instructed (currently 5 mg on Mondays and Friday and 2.5 on Wednesday.  He is also on Jardiance 10 mg daily, unable to use spironolactone due to low EGFR.  His CardioMEMS reading today was 24, threshold is 20-24.  We will go ahead and increase metolazone to 5 mg daily this week.  We will get labs on Monday and see him afterwards    ?  Severe aortic stenosis status post TAVR with Dr. Paris Lore in [place] on 7/27:   - He had CT chest and abdomen with contrast on 7/1 that showed thickening and calcification of the aortic valve with a normal caliber ascending thoracic aorta, several sub-5 mm nodules scattered throughout both lungs, cardiomegaly, coronary artery disease and pulmonary hypertension.    -He had postprocedure echo on 7/28 that showed prosthetic valve was normal, no stenosis or regurgitation.o.  ?   Severe mitral regurgitation that is noted on most recent echo, in the future he may need a MitraClip.  ?  CKD stage III: See above, follows with Dr. Mercie Eon..    ?   Atrial fibrillation: He continues to take amiodarone 200 mg p.o.daily, he is on oral anticoagulation with Xarelto 15 mg daily, he denies any upper or lower GI bleeding symptoms, however he does continue to have increased bruising since the addition of aspirin 81 mg daily per TAVR protocol..  He has been in atrial fibrillation since at least 2020.     Permanent pacemaker in situ: He underwent permanent pacemaker insertion with Dr. Milas Kocher on 9/1, final device evaluation on 9/2 showed normal function.  He had postoperative chest x-ray on 9/2 that showed small pleural effusions and improved edema, interval placement of dual-chamber pacemaker with leads terminating in the right atrium and right ventricle.        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  This is a low risk surgery.  His cardiovascular risk is very low (less than 1%).  Would recommend to stop taking Xarelto starting tomorrow morning and day of surgery.  Restart it per recommendations from surgical team.      60 minutes of time spent with patient and family.  Greater than 30 minutes of this time spent counseling regarding heart failure with reduced ejection fraction, medications and volume control.  Vanetta Shawl M.D  [facility] Cardiologist      Current Medications (including today's revisions)  ? acetaminophen (TYLENOL) 325 mg tablet Take two tablets by mouth every 6 hours as needed.   ? allopurinoL (ZYLOPRIM) 300 mg tablet Take 300 mg by mouth daily. Take with food.   ? amiodarone (CORDARONE) 200 mg tablet Take one tablet by mouth daily.   ? bumetanide (BUMEX) 2 mg tablet Take three tablets by mouth twice daily.   ? cholecalciferol (vitamin D3) (VITAMIN D3) 1,000 units tablet  Take 1,000 Units by mouth daily.   ? docusate (COLACE) 100 mg capsule Take 100 mg by mouth twice daily.   ? empagliflozin (JARDIANCE) 10 mg tablet Take one tablet by mouth daily.   ? ferrous sulfate (FEOSOL) 325 mg (65 mg iron) tablet Take 325 mg by mouth twice daily. Take on an empty stomach at least 1 hour before or 2 hours after food.    ? fluticasone/salmeterol (ADVAIR) 100/50 mcg inhalation disk Inhale 1 puff by mouth into the lungs daily.   ? lactobacillus rhamnosus (GG) (CULTURELLE) 10 billion cell cap Take 1 capsule by mouth daily with breakfast.   ? magnesium oxide (MAG-OX) 400 mg (241.3 mg magnesium) tablet Take one tablet by mouth daily.   ? metOLazone (ZAROXOLYN) 2.5 mg tablet Take one tablet by mouth three times weekly. Take Metolazone 2.5 mg on Monday, Wednesday, Friday. Take 1.25 mg on Sunday, Tuesday, Thursday and Saturday.   ? omeprazole DR (PRILOSEC) 20 mg capsule Take one capsule by mouth daily before breakfast.   ? polyethylene glycol 3350 (MIRALAX) 17 g packet Take one packet by mouth as Needed.   ? potassium chloride SR (KLOR-CON M20) 20 mEq tablet Take three tablets by mouth three times daily.   ? rivaroxaban (XARELTO) 15 mg tablet Take one tablet by mouth daily with breakfast. Take with food.   ? rosuvastatin (CRESTOR) 5 mg tablet Take one tablet by mouth three times weekly.   ? senna (SENOKOT) 8.6 mg tablet Take 2 tablets by mouth twice daily as needed for Constipation.   ? sildenafiL (VIAGRA) 100 mg tablet TAKE 1 TABLET BY MOUTH ONCE DAILY AS NEEDED FOR SEXUAL ACTIVITY   ? tafamidis (VYNDAMAX) 61 mg capsule Take one capsule by mouth daily.   ? VENTOLIN HFA 90 mcg/actuation inhaler Inhale 1 puff by mouth into the lungs as Needed.

## 2021-02-06 ENCOUNTER — Ambulatory Visit: Admit: 2021-02-06 | Discharge: 2021-02-06 | Payer: MEDICARE

## 2021-02-06 ENCOUNTER — Encounter: Admit: 2021-02-06 | Discharge: 2021-02-06 | Payer: MEDICARE

## 2021-02-06 DIAGNOSIS — E8582 Wild-type transthyretin-related (ATTR) amyloidosis: Secondary | ICD-10-CM

## 2021-02-06 DIAGNOSIS — K921 Melena: Secondary | ICD-10-CM

## 2021-02-07 ENCOUNTER — Encounter: Admit: 2021-02-07 | Discharge: 2021-02-07 | Payer: MEDICARE

## 2021-02-07 ENCOUNTER — Ambulatory Visit: Admit: 2021-02-07 | Discharge: 2021-02-07 | Payer: MEDICARE

## 2021-02-07 DIAGNOSIS — C4490 Unspecified malignant neoplasm of skin, unspecified: Secondary | ICD-10-CM

## 2021-02-07 DIAGNOSIS — J45909 Unspecified asthma, uncomplicated: Secondary | ICD-10-CM

## 2021-02-07 DIAGNOSIS — I10 Essential (primary) hypertension: Secondary | ICD-10-CM

## 2021-02-07 DIAGNOSIS — C44311 Basal cell carcinoma of skin of nose: Secondary | ICD-10-CM

## 2021-02-07 DIAGNOSIS — I358 Other nonrheumatic aortic valve disorders: Secondary | ICD-10-CM

## 2021-02-07 DIAGNOSIS — I340 Nonrheumatic mitral (valve) insufficiency: Secondary | ICD-10-CM

## 2021-02-07 DIAGNOSIS — E859 Amyloidosis, unspecified: Secondary | ICD-10-CM

## 2021-02-07 DIAGNOSIS — J302 Other seasonal allergic rhinitis: Secondary | ICD-10-CM

## 2021-02-07 DIAGNOSIS — C44319 Basal cell carcinoma of skin of other parts of face: Secondary | ICD-10-CM

## 2021-02-07 DIAGNOSIS — I350 Nonrheumatic aortic (valve) stenosis: Secondary | ICD-10-CM

## 2021-02-07 DIAGNOSIS — I361 Nonrheumatic tricuspid (valve) insufficiency: Secondary | ICD-10-CM

## 2021-02-07 DIAGNOSIS — Z8679 Personal history of other diseases of the circulatory system: Secondary | ICD-10-CM

## 2021-02-07 DIAGNOSIS — E785 Hyperlipidemia, unspecified: Secondary | ICD-10-CM

## 2021-02-07 DIAGNOSIS — N189 Chronic kidney disease, unspecified: Secondary | ICD-10-CM

## 2021-02-07 MED ORDER — DOXYCYCLINE HYCLATE 100 MG PO CAP
100 mg | ORAL_CAPSULE | Freq: Two times a day (BID) | ORAL | 0 refills | 8.00000 days | Status: AC
Start: 2021-02-07 — End: ?

## 2021-02-11 ENCOUNTER — Encounter: Admit: 2021-02-11 | Discharge: 2021-02-11 | Payer: MEDICARE

## 2021-02-11 DIAGNOSIS — I4729 NSVT (nonsustained ventricular tachycardia): Secondary | ICD-10-CM

## 2021-02-11 DIAGNOSIS — I5020 Unspecified systolic (congestive) heart failure: Secondary | ICD-10-CM

## 2021-02-11 DIAGNOSIS — R001 Bradycardia, unspecified: Secondary | ICD-10-CM

## 2021-02-11 DIAGNOSIS — E8582 Wild-type transthyretin-related (ATTR) amyloidosis: Secondary | ICD-10-CM

## 2021-02-11 DIAGNOSIS — I361 Nonrheumatic tricuspid (valve) insufficiency: Secondary | ICD-10-CM

## 2021-02-11 DIAGNOSIS — I350 Nonrheumatic aortic (valve) stenosis: Secondary | ICD-10-CM

## 2021-02-11 DIAGNOSIS — I5032 Chronic diastolic (congestive) heart failure: Secondary | ICD-10-CM

## 2021-02-11 DIAGNOSIS — I4891 Unspecified atrial fibrillation: Secondary | ICD-10-CM

## 2021-02-11 DIAGNOSIS — I420 Dilated cardiomyopathy: Secondary | ICD-10-CM

## 2021-02-11 DIAGNOSIS — I340 Nonrheumatic mitral (valve) insufficiency: Secondary | ICD-10-CM

## 2021-02-11 DIAGNOSIS — I5033 Acute on chronic diastolic (congestive) heart failure: Secondary | ICD-10-CM

## 2021-02-11 LAB — BNP (B-TYPE NATRIURETIC PEPTI): BNP: 172 — ABNORMAL HIGH (ref 0–100)

## 2021-02-11 LAB — BASIC METABOLIC PANEL
ANION GAP: 16
BLD UREA NITROGEN: 119 (ref 8.4–25.7)
CALCIUM: 10 — ABNORMAL HIGH (ref 8.8–10.0)
CHLORIDE: 90 — ABNORMAL LOW (ref 98–107)
CO2: 28
CREATININE: 3.1 — ABNORMAL HIGH (ref 0.72–1.25)
GFR ESTIMATED: 20
GLUCOSE,PANEL: 86
POTASSIUM: 3.9
SODIUM: 134 — ABNORMAL LOW (ref 136–145)

## 2021-02-11 LAB — TROPONIN-I: TROPONIN I: 0.2 — ABNORMAL HIGH (ref 0.000–0.033)

## 2021-02-11 NOTE — Telephone Encounter
PAD today up to 31, goal 22, thresholds 20-24. Takes metolazone 5 on Monday, Weds, Fri. Will review with provider.      DAILY CardioMEMS READINGS    Goal PA Diastolic: 22 mmHg  PA Diastolic Thresholds: 20-24 mmHg          CARDIAC MEDS:    Home Medications    Medication Sig   acetaminophen (TYLENOL) 325 mg tablet Take two tablets by mouth every 6 hours as needed.   allopurinoL (ZYLOPRIM) 300 mg tablet Take 300 mg by mouth daily. Take with food.   amiodarone (CORDARONE) 200 mg tablet Take one tablet by mouth daily.   bumetanide (BUMEX) 2 mg tablet Take three tablets by mouth twice daily.   cholecalciferol (vitamin D3) (VITAMIN D3) 1,000 units tablet Take 1,000 Units by mouth daily.   docusate (COLACE) 100 mg capsule Take 100 mg by mouth twice daily.   doxycycline hyclate (VIBRAMYCIN) 100 mg capsule Take one capsule by mouth twice daily for 7 days. Take with food and water. Do not take within 2 hours of calcium supplements or dairy products.   empagliflozin (JARDIANCE) 10 mg tablet Take one tablet by mouth daily.   ferrous sulfate (FEOSOL) 325 mg (65 mg iron) tablet Take 325 mg by mouth twice daily. Take on an empty stomach at least 1 hour before or 2 hours after food.    fluticasone/salmeterol (ADVAIR) 100/50 mcg inhalation disk Inhale 1 puff by mouth into the lungs daily.   lactobacillus rhamnosus (GG) (CULTURELLE) 10 billion cell cap Take 1 capsule by mouth daily with breakfast.   magnesium oxide (MAG-OX) 400 mg (241.3 mg magnesium) tablet Take one tablet by mouth daily.   metOLazone (ZAROXOLYN) 5 mg tablet Take one tablet by mouth three times weekly. Monday-Wednesday-Friday   omeprazole DR (PRILOSEC) 20 mg capsule Take one capsule by mouth daily before breakfast.   polyethylene glycol 3350 (MIRALAX) 17 g packet Take one packet by mouth as Needed.   potassium chloride SR (KLOR-CON M20) 20 mEq tablet Take three tablets by mouth three times daily.   rivaroxaban (XARELTO) 15 mg tablet Take one tablet by mouth daily with breakfast. Take with food.   rosuvastatin (CRESTOR) 5 mg tablet Take one tablet by mouth three times weekly.   senna (SENOKOT) 8.6 mg tablet Take 2 tablets by mouth twice daily as needed for Constipation.   sildenafiL (VIAGRA) 100 mg tablet TAKE 1 TABLET BY MOUTH ONCE DAILY AS NEEDED FOR SEXUAL ACTIVITY   tafamidis (VYNDAMAX) 61 mg capsule Take one capsule by mouth daily.   VENTOLIN HFA 90 mcg/actuation inhaler Inhale 1 puff by mouth into the lungs as Needed.       RECENT LABS:    Basic Metabolic Profile    Lab Results   Component Value Date/Time    NA 135 (L) 01/28/2021 12:00 AM    K 3.7 01/28/2021 12:00 AM    CA 9.9 01/28/2021 12:00 AM    CL 91 (L) 01/28/2021 12:00 AM    CO2 30 01/28/2021 12:00 AM    GAP 14 01/28/2021 12:00 AM    Lab Results   Component Value Date/Time    BUN 110 (H) 01/28/2021 12:00 AM    CR 2.84 (H) 01/28/2021 12:00 AM    GLU 178 (H) 01/28/2021 12:00 AM          UPCOMING APPOINTMENTS:    Future Appointments   Date Time Provider Department Center   02/13/2021 11:00 AM IM DERM NURSE [facility]   02/18/2021  3:45 PM Arneta Cliche, MD [facility] C[facility] Exam   03/28/2021  8:00 AM Robyne Askew, MD [facility] IM   06/26/2021  3:00 PM [name], MD [facility] IM

## 2021-02-12 ENCOUNTER — Encounter: Admit: 2021-02-12 | Discharge: 2021-02-12 | Payer: MEDICARE

## 2021-02-12 NOTE — Telephone Encounter
Spoke with Helene Kelp, patient's spouse about scheduling appt tomorrow. Instructed her that the lab work is more elevated, his CardioMEMs were elevated yesterday and Dr. Manuella Ghazi and Hinton Dyer were both in agreement Ray [name] should be seen sooner than next week. Scheduled appt for 11:00. I worked with a Marine scientist in the dermatology clinic, Emmit Alexanders, who helped move the derm appt to 10:00 and Helene Kelp confirmed they can come for that appointment and then see Dr. Manuella Ghazi. She stated Ray [name] did take his metolazone yesterday and was down a couple pounds today already. They were about to send in his MEMs readings for today. No questions at this time.

## 2021-02-13 ENCOUNTER — Ambulatory Visit: Admit: 2021-02-13 | Discharge: 2021-02-13 | Payer: MEDICARE

## 2021-02-13 ENCOUNTER — Encounter: Admit: 2021-02-13 | Discharge: 2021-02-13 | Payer: MEDICARE

## 2021-02-13 DIAGNOSIS — I10 Essential (primary) hypertension: Secondary | ICD-10-CM

## 2021-02-13 DIAGNOSIS — J45909 Unspecified asthma, uncomplicated: Secondary | ICD-10-CM

## 2021-02-13 DIAGNOSIS — E8582 Wild-type transthyretin-related (ATTR) amyloidosis: Secondary | ICD-10-CM

## 2021-02-13 DIAGNOSIS — I340 Nonrheumatic mitral (valve) insufficiency: Secondary | ICD-10-CM

## 2021-02-13 DIAGNOSIS — I361 Nonrheumatic tricuspid (valve) insufficiency: Secondary | ICD-10-CM

## 2021-02-13 DIAGNOSIS — E785 Hyperlipidemia, unspecified: Secondary | ICD-10-CM

## 2021-02-13 DIAGNOSIS — C4490 Unspecified malignant neoplasm of skin, unspecified: Secondary | ICD-10-CM

## 2021-02-13 DIAGNOSIS — E859 Amyloidosis, unspecified: Secondary | ICD-10-CM

## 2021-02-13 DIAGNOSIS — N189 Chronic kidney disease, unspecified: Secondary | ICD-10-CM

## 2021-02-13 DIAGNOSIS — J302 Other seasonal allergic rhinitis: Secondary | ICD-10-CM

## 2021-02-13 DIAGNOSIS — I358 Other nonrheumatic aortic valve disorders: Secondary | ICD-10-CM

## 2021-02-13 DIAGNOSIS — I350 Nonrheumatic aortic (valve) stenosis: Secondary | ICD-10-CM

## 2021-02-13 DIAGNOSIS — Z8679 Personal history of other diseases of the circulatory system: Secondary | ICD-10-CM

## 2021-02-13 MED ORDER — METOLAZONE 5 MG PO TAB
ORAL_TABLET | ORAL | 1 refills | 84.00000 days | Status: AC
Start: 2021-02-13 — End: ?

## 2021-02-13 NOTE — Progress Notes
Daily CardioMEMS Readings    Goal PA Diastolic: 22 mmHg   PA Diastolic Thresholds: 31-54 mmHg  Notified Dr. Aida Puffer

## 2021-02-13 NOTE — Progress Notes
POST-OP VISIT NOTE    CC: Bolster removal and wound check     HPI: Ray [name] is a 85 y.o. male  s/p Mohs surgery with full thickness skin graft repair on the nasal tip and primary repair of left neck graft site   Patient concerns: small bleeding/oozing area from left neck graft site     ROS: Patient denies any other skin complaints    EXAM:  General: NAD, well appearing, well nourished, Mood/affect are pleasant and appropriate  Skin: Examination of the nasal tip reveals well healing surgical site with healthy graft appearance. Examination of graft donor site reveals well healing surgical site.  ?  A/P:   - Continue gentle wound care, vaseline BID x 2 more weeks  - Pressure bandage applied for 2 more days   - Natural course of wound healing and scar management reviewed. Informed patient it will take 2-3 months for scar to flatten.  - Patient informed to call the office if any issues were to arise in the future. Sunscreen/sun avoidance reinforced.   - Patient instructed to continue to follow-up with primary dermatologist for routine skin screenings.    RTC 2-3 month f/u

## 2021-02-13 NOTE — Progress Notes
Date of Service: 02/13/2021    Jadon Mitter is a 85 y.o. male.       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  I had increase the metolazone to 5 mg on Monday Wednesday Friday and ordered repeat labs including CBC, CMP and BNP. He is having high PAD numbers lately. On Monday PAS was and PAD was 30.  ?  PAD today  ?  Today [name] reports that the swelling in his legs has continued, he has abdominal distention, early satiety, he did increase the metolazone as instructed. ?He states he is not sleeping well, he does not urinate very much during the daytime and is urinating more in the evening. ?He thinks he sleeping only 2 to 3 hours at night. ?He has had bleeding of his left knee, his wife also notes that he has fine petechial bleeding on his back so much so that they need to apply Hx to the bedding. ?He has restarted cardiac rehab and was able to do the NuStep, treadmill and recumbent bike for 5 minutes each. ?He did go to [facility] and had his labs drawn yesterday as instructed. ?He is taking his cardiovascular medications as prescribed. ?Home blood pressure was 142/62.  ?    Vitals:    02/13/21 1057   BP: (!) 142/62   BP Source: Arm, Right Upper   Pulse: 83   SpO2: 96%   O2 Device: None (Room air)   PainSc: Zero   Weight: 72.8 kg (160 lb 9.6 oz)   Height: 170.2 cm (5' 7)     Body mass index is 25.15 kg/m?[name]     Past Medical History  Patient Active Problem List    Diagnosis Date Noted   ? Iron deficiency anemia 11/27/2020   ? Heart failure (HCC) 11/26/2020   ? AKI (acute kidney injury) (HCC) 11/26/2020   ? S/P TAVR (transcatheter aortic valve replacement) 10/25/2020   ? Hypokalemia 10/25/2020   ? Mitral regurgitation 09/06/2020   ? Nonrheumatic tricuspid valve regurgitation 09/06/2020   ? Nonrheumatic aortic valve stenosis 09/06/2020   ? Asymptomatic microscopic hematuria 12/25/2019   ? S/P left pulmonary artery pressure sensor implant placement 12/15/2019     *Patient has CardioMEMS (Pulmonary Artery Pressure Sensor)* Please call [name], CardioMEMs Program Coordinator [phone] or page Heart Failure Rounding Team if patient is admitted or presents to ED*          ? Chronic heart failure with preserved ejection fraction (HCC) 12/15/2019   ? Acute on chronic diastolic CHF (congestive heart failure), NYHA class 2 (HCC) 11/14/2019   ? 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  ?  01/26/2019 Echo EF:?60%  IVS 1.50 cm (Range: 0.6 - 1.0)         LV PW 1.40 cm (Range: 0.6 - 1.0)         ?  1. Moderate concentric left ventricular hypertrophy  2. Unable to assess diastolic function due to atrial fibrillation  3. Moderate biatrial dilatation  4. Normal right ventricular size and systolic function  5. Normal central venous pressure  6. Mild calcific aortic valve stenosis. ?Mean gradient is 16 mmHg with a peak velocity of 2.8 m/s. ?Trace aortic valve regurgitation is seen  7. Mitral valve is structurally okay. ?There is moderate central regurgitation. ?No stenosis  8. Moderate tricuspid valve regurgitation  9. Pulmonary artery static pressure measures at 33 mmHg   06/28/2019 TC PYP Multiview planar views and SPECT imaging confirms the presence of severe marked diffuse global left ventricular and right ventricular myocardial uptake of tracer. ?There is normal rib and bone uptake.  ?  Two hour heart/contralateral lung (H/CL) ratio: ?1.977?this metric is abnormal and highly suggestive of ATTR cardiac amyloidosis.?  ?  Visual Semi-Quantitative Grading Scale Analysis:?  The study is grade 3 indicating myocardial uptake greater than rib and bone uptake. ?The pattern is highly and strongly suggestive of ATTR cardiac amyloidosis.  ?  SUMMARY/OPINION:   This study is abnormal and strongly suggestive of ATTR cardiac amyloidosis. ?Left ventricular myocardial uptake is diffuse, intense, and global. ?There is also prominent right ventricular free wall uptake of tracer. ?The pattern is typical for ATTR cardiac amyloidosis   ? Cardiac MRI ?   07/25/2019 Kappa/Lambda FLC ?  ? Ref. Range 07/25/2019 15:48   Kappa, FLC Latest Ref Range: 0.33 - 1.94 MG/DL 1.61 (H)   Lambda, FLC Latest Ref Range: 0.57 - 2.63 MG/DL 0.96 (H)   Kappa/Lambda FLC Latest Ref Range: 0.26 - 1.65  1.83 (H)      07/25/2019 Serum Immunofixation ?NO PARAPROTEIN SEEN   cancelled Urine Immunofixation ?Patient unable to provide urine sample 4/26    07/25/2019 Genetic Testing ?Negative   ? Biopsy (Fat Pad, Cardiac, Bone Marrow) ?   ? GI Symptoms ?   ?+? 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   ?  ? Ref. Range 07/25/2019 15:48   B Type Natriuretic Peptide Latest Ref Range: 0 - 100 PG/ML 966.0 (H)   Troponin-I Latest Ref Range: 0.0 - 0.05 NG/ML 0.05   ?  Completed:  -Tafamidis start 08/2019. PA approved, pt approved for [facility] patient assistance from 09/01/2019 to 03/30/2020  -Baseline KCCQ12 completed 5/18/202, Summary Score 61.46  -Baseline 6 min walk completed 08/16/2019, distance walked 950 feet, 289.75 meters, duration of test 6 minutes  -Baseline CPET completed 10/17/2019       ? S/P ablation of atrial fibrillation 05/18/2018   ? Lightheadedness 03/16/2018   ? Hospitalization within last 30 days 12/24/2017   ? Atrial fibrillation with RVR (HCC) 12/14/2017   ? Chronic kidney disease, stage 4 (severe) (HCC) 12/13/2017   ? Junctional bradycardia 12/12/2017   ? Bilateral leg edema 12/11/2017   ? On amiodarone therapy 12/11/2017   ? Heart failure with preserved ejection fraction (HCC) 01/01/2016   ? NSVT (nonsustained ventricular tachycardia) 06/12/2015   ? Medication side effects 11/09/2014   ? Heart palpitations 09/19/2014   ? Longstanding persistent atrial fibrillation (HCC) 03/17/2013     03/26/2018 - ECHO:  Moderate concentric LVH seen. The EF is about 55%.  The atria are slightly dilated.  The RV function is mildly decreased.  Mild to moderate MR and TR seen.  Mild AI is present.  Based on the gradients and appearance, the AS appears to be mild. The mean gradient is 16 mm Hg with a peak velocity of 2.6 m/s.  03/26/2018 -  Zio Patch:  Predominant rhythm was normal sinus.  Two runs of ventricular tachycardia occurred, they were monomorphic, the longest run lasted 3 seconds, this episode occurred on April 08, 2018 at 9:41 PM.  One episode of AIVR also appeared to be present, it lasted approximately 4.6 seconds  Brief and rare episodes of SVT that probably represent atrial fibrillation, the longest lasting 6.3 seconds.  No evidence of atrial flutter and no evidence of high degree AV block.  05/12/2018 - LAAA:  Persistent Atrial Fibrillation status post successful pulmonary vein antral isolation.  Cavotricuspid Isthmus Ablation.  Ablation of Fractionated Intracardiac Electrograms.  Ablation for an Additional Discrete Arrhythmia-LA AFL after AFIB/PVI Ablation  06/23/2018 - Cardioversion:  Successful direct current cardioversion from symptomatic atrial fibrillation to sinus rhythm.  03/16/2019 - DCCV:  Successful direct current cardioversion from atrial fibrillation to sinus rhythm.     ? Systolic murmur of aorta 02/04/2013   ? SOB (shortness of breath) 02/04/2013   ? Carotid bruit present 01/23/2012   ? Overweight (BMI 25.0-29.9) 01/30/2011   ? Essential hypertension 01/30/2009   ? Hyperlipidemia 01/30/2009 Hyperlipidemia, on statin therapy     ? Skin cancer 01/30/2009   ? Seasonal allergic reaction 01/30/2009         Review of Systems   All other systems reviewed and are negative.      Physical Exam      Cardiovascular Studies    General Appearance: In NAD.   Neck Veins: 7 cm JVP, neck veins are distended, + HJR   Chest Inspection: Left subclavian incision clean, dry and intact  Respiratory Effort: breathing comfortably, no respiratory distress   Auscultation/Percussion: lungs clear to auscultation, no rales, rhonchi or wheezing   Cardiac Rhythm: irregularly irregular rhythm and normal rate   Cardiac Auscultation: S1, S2 normal, no rub, no definite S3 ?or S4   Murmurs: grade iii/vi systolic ejection murmur at left mid axillary line  Peripheral Circulation: normal peripheral circulation   Pedal Pulses: normal symmetric pedal pulses   Lower Extremity Edema: +?2?mm bilateral peripheral edema?to the knees. ?Venous discoloration, small oozing of blood at his knees.. .  Abdominal Exam: soft non-tender, no obvious masses, bowel sounds normal   Gait &?Station: walks without assistance   Orientation: oriented to person, place and time   Affect &?Mood: appropriate and sustained affect   Language and Memory: patient responsive and seems to comprehend information   Neurologic Exam: neurological assessment grossly intact   Vital signs were reviewed  ?    Cardiovascular Health Factors  Vitals BP Readings from Last 3 Encounters:   02/13/21 (!) 142/62   02/07/21 (!) 141/66   02/04/21 132/52     Wt Readings from Last 3 Encounters:   02/13/21 72.8 kg (160 lb 9.6 oz)   02/07/21 74.4 kg (164 lb)   02/04/21 74.6 kg (164 lb 6.4 oz)     BMI Readings from Last 3 Encounters:   02/13/21 25.15 kg/m?   02/07/21 26.47 kg/m?   02/04/21 26.53 kg/m?      Smoking Social History     Tobacco Use   Smoking Status Never   Smokeless Tobacco Never      Lipid Profile Cholesterol   Date Value Ref Range Status   09/17/2020 145 <200 MG/DL Final     HDL Date Value Ref Range Status   09/17/2020 64 >40 MG/DL Final     LDL   Date Value Ref Range Status   09/17/2020 72 <100 mg/dL Final  Triglycerides   Date Value Ref Range Status   09/17/2020 87 <150 MG/DL Final      Blood Sugar Hemoglobin A1C   Date Value Ref Range Status   10/22/2020 6.4 (H) 4.0 - 6.0 % Final     Comment:     The ADA recommends that most patients with type 1 and type 2 diabetes maintain   an A1c level <7%.       Glucose   Date Value Ref Range Status   02/11/2021 86  Final   01/28/2021 178 (H) 70 - 105 Final   01/11/2021 170 (H) 70 - 105 Final     Glucose, POC   Date Value Ref Range Status   12/25/2020 219 (H) 70 - 100 MG/DL Final   45/40/9811 914 (H) 70 - 100 MG/DL Final   78/29/5621 308 (H) 70 - 100 MG/DL Final   65/78/4696 295 (H) 70 - 100 MG/DL Final          Problems Addressed Today  No diagnosis found.    Assessment and Plan    ?Wild-type ATTR amyloidosis:   -He has had a previous technetium pyrophosphate scan on 06/28/2019 that showed a 2-hour heart/contralateral lung ratio of 1.97 with visual semiquantitative grading grade 3, abnormal and highly suggestive of ATTR cardiac amyloidosis.   - He has had kappa 7.45, lambda 4.07 and free light chain ratio 1.83 on 07/25/2019  -He had serum electrophoresis that showed no paraprotein.  ?  ?  ?Heart failure with preserved ejection fraction:  - He appears hypervolemiv on physical exam. ?He is currently taking Bumex 6 mg twice daily and metolazone as instructed (currently 5 mg on Mondays and Friday and 2.5 on Wednesday. ?He is also on Jardiance 10 mg daily, unable to use spironolactone due to low EGFR. ?His CardioMEMS reading today was 27, threshold is 20-24.    We are going ahead and changing   Metalazone 5mg  on Monday, Wednesday, Friday and Sunday.  Rest of the days 2.5mg ?  ?  Severe aortic stenosis status post TAVR with Dr. Paris Lore in [place] on 7/27:?  - He had CT chest and abdomen with contrast on 7/1 that showed thickening and calcification of the aortic valve with a normal caliber ascending thoracic aorta, several sub-5 mm nodules scattered throughout both lungs, cardiomegaly, coronary artery disease and pulmonary hypertension. ?  -He had postprocedure echo on 7/28 that showed prosthetic valve was normal, no stenosis or regurgitation.o.  ?  ?Severe mitral regurgitation?that is noted on most recent echo, in the future he may need a MitraClip.  ?  CKD stage III: See above,?follows with Dr. Mercie Eon.. ?  ?  ?Atrial fibrillation: He continues to take amiodarone 200 mg p.o.daily, he is on oral anticoagulation with Xarelto 15 mg daily, he denies any upper or lower GI bleeding symptoms, however he does continue to have increased bruising since the addition of aspirin 81 mg daily per TAVR protocol.[name] ?He has been in atrial fibrillation since at least 2020.  ?  Permanent pacemaker in situ:?He underwent permanent pacemaker insertion with Dr. Leodis Liverpool 9/1, final device evaluation on 9/2 showed normal function. ?He had postoperative chest x-ray on 9/2 that showed small pleural effusions and improved edema, interval placement of dual-chamber pacemaker with leads terminating in the right atrium and right ventricle.  ??  6 Minute Walk Last Results 02/13/2021 06/26/2020 08/16/2019   SpO2:  Pre-Activity 96 99 99   Pulse:  POST-Activity 89 94 122   BP:  POST-Activity 150/62  126/76 151/76   SpO2:  POST-Activity 97 99 96   Distance Walked in Meters 305 152.5 289.75     6 Minute walk distance historical values 02/13/2021 06/26/2020 08/16/2019   Distance Walked in Meters 305 152.5 289.75     ?  60 minutes of time spent with patient and family.  Greater than 30 minutes of this time spent counseling regarding heart failure with reduced ejection fraction, medications and volume control.  Vanetta Shawl M.D  [facility] Transplant Cardiologist  ?  Current Medications (including today's revisions)  ? acetaminophen (TYLENOL) 325 mg tablet Take two tablets by mouth every 6 hours as needed.   ? allopurinoL (ZYLOPRIM) 300 mg tablet Take 300 mg by mouth daily. Take with food.   ? amiodarone (CORDARONE) 200 mg tablet Take one tablet by mouth daily.   ? bumetanide (BUMEX) 2 mg tablet Take three tablets by mouth twice daily.   ? cholecalciferol (vitamin D3) (VITAMIN D3) 1,000 units tablet Take 1,000 Units by mouth daily.   ? docusate (COLACE) 100 mg capsule Take 100 mg by mouth twice daily.   ? doxycycline hyclate (VIBRAMYCIN) 100 mg capsule Take one capsule by mouth twice daily for 7 days. Take with food and water. Do not take within 2 hours of calcium supplements or dairy products.   ? empagliflozin (JARDIANCE) 10 mg tablet Take one tablet by mouth daily.   ? ferrous sulfate (FEOSOL) 325 mg (65 mg iron) tablet Take 325 mg by mouth twice daily. Take on an empty stomach at least 1 hour before or 2 hours after food.    ? fluticasone/salmeterol (ADVAIR) 100/50 mcg inhalation disk Inhale 1 puff by mouth into the lungs daily.   ? lactobacillus rhamnosus (GG) (CULTURELLE) 10 billion cell cap Take 1 capsule by mouth daily with breakfast.   ? magnesium oxide (MAG-OX) 400 mg (241.3 mg magnesium) tablet Take one tablet by mouth daily.   ? metOLazone (ZAROXOLYN) 5 mg tablet Take one tablet by mouth three times weekly. Monday-Wednesday-Friday   ? omeprazole DR (PRILOSEC) 20 mg capsule Take one capsule by mouth daily before breakfast.   ? polyethylene glycol 3350 (MIRALAX) 17 g packet Take one packet by mouth as Needed.   ? potassium chloride SR (KLOR-CON M20) 20 mEq tablet Take three tablets by mouth three times daily.   ? rivaroxaban (XARELTO) 15 mg tablet Take one tablet by mouth daily with breakfast. Take with food.   ? rosuvastatin (CRESTOR) 5 mg tablet Take one tablet by mouth three times weekly.   ? senna (SENOKOT) 8.6 mg tablet Take 2 tablets by mouth twice daily as needed for Constipation.   ? sildenafiL (VIAGRA) 100 mg tablet TAKE 1 TABLET BY MOUTH ONCE DAILY AS NEEDED FOR SEXUAL ACTIVITY   ? tafamidis (VYNDAMAX) 61 mg capsule Take one capsule by mouth daily.   ? VENTOLIN HFA 90 mcg/actuation inhaler Inhale 1 puff by mouth into the lungs as Needed.

## 2021-02-16 MED FILL — VYNDAMAX 61 MG PO CAP: 61 mg | ORAL | 30 days supply | Qty: 30 | Fill #7 | Status: AC

## 2021-02-18 ENCOUNTER — Encounter: Admit: 2021-02-18 | Discharge: 2021-02-18 | Payer: MEDICARE

## 2021-02-18 NOTE — Telephone Encounter
Received notification that  [employer] has not been connected since 01/30/21. Patient was instructed to look at his/her transmitter to make sure that it is plugged into power and send a manual transmission to reconnect the transmitter. If he/she has any questions about how to send a transmission or if the transmitter does not appear to be working properly, they need to contact the device company directly. Patient was provided with that contact number. Requested the patient send [place] a MyChart message or contact our device nurses at [phone] to let us know after they have sent their transmission. Spoke with pt and he will reconnect to the app today. Patient verbalized understanding. BC     Note: Patient needs to send a manual remote interrogation to reestablish communication to his/her remote transmitter.

## 2021-02-26 ENCOUNTER — Ambulatory Visit: Admit: 2021-02-26 | Discharge: 2021-02-26 | Payer: MEDICARE

## 2021-02-26 ENCOUNTER — Encounter: Admit: 2021-02-26 | Discharge: 2021-02-26 | Payer: MEDICARE

## 2021-02-26 DIAGNOSIS — N189 Chronic kidney disease, unspecified: Secondary | ICD-10-CM

## 2021-02-26 DIAGNOSIS — E859 Amyloidosis, unspecified: Secondary | ICD-10-CM

## 2021-02-26 DIAGNOSIS — I350 Nonrheumatic aortic (valve) stenosis: Secondary | ICD-10-CM

## 2021-02-26 DIAGNOSIS — I340 Nonrheumatic mitral (valve) insufficiency: Secondary | ICD-10-CM

## 2021-02-26 DIAGNOSIS — I361 Nonrheumatic tricuspid (valve) insufficiency: Secondary | ICD-10-CM

## 2021-02-26 DIAGNOSIS — I10 Essential (primary) hypertension: Secondary | ICD-10-CM

## 2021-02-26 DIAGNOSIS — J302 Other seasonal allergic rhinitis: Secondary | ICD-10-CM

## 2021-02-26 DIAGNOSIS — Z8679 Personal history of other diseases of the circulatory system: Secondary | ICD-10-CM

## 2021-02-26 DIAGNOSIS — E785 Hyperlipidemia, unspecified: Secondary | ICD-10-CM

## 2021-02-26 DIAGNOSIS — J45909 Unspecified asthma, uncomplicated: Secondary | ICD-10-CM

## 2021-02-26 DIAGNOSIS — I5033 Acute on chronic diastolic (congestive) heart failure: Secondary | ICD-10-CM

## 2021-02-26 DIAGNOSIS — I358 Other nonrheumatic aortic valve disorders: Secondary | ICD-10-CM

## 2021-02-26 DIAGNOSIS — C4490 Unspecified malignant neoplasm of skin, unspecified: Secondary | ICD-10-CM

## 2021-02-26 NOTE — Progress Notes
ThanDate of Service: 02/26/2021    Ray [name] is a 85 y.o. male.       HPI      I had the pleasure of seeing Ray [name], who is accompanied by his wife Ray [name] today in [place] [facility] for amyloidosis follow-up.     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    He was last seen in clinic on 11/16 by Dr. Herma Carson.  [name], at that office visit his Cardiomems PA diastolic was 27.  He had continued to have peripheral edema, abdominal distention and early satiety. Dr. [name] had recommended to him take metolazone 5 mg on Monday, Wednesday, Friday and Sunday, all other days 2.5 mg and have follow-up appointment in 2 weeks.    Today he reports that overall he feels well, he has had some right leg pain, he is not sure if this is due to an increase in activity as he put up the Christmas lights this weekend.  He has tried Aspercreme and it has not helped him, he is taking Tylenol as well.  Of note he had had a recent basals skin cancer graft to his nose and he was unable to do any of his usual exercises until released by dermatology.  He denies any chest pain, palpitations, orthopnea, cough or PND.  He is taking his diuretics as prescribed.  He does have slight swelling in his legs however it is unchanged from previous office visits.  His Cardiomems reading today is 26.            Vitals:    02/26/21 1259   BP: 138/52   BP Source: Arm, Right Upper   Pulse: 81   SpO2: 98%   O2 Device: None (Room air)   PainSc: Zero   Weight: 72.1 kg (159 lb)   Height: 170.2 cm (5' 7)     Body mass index is 24.9 kg/m?[name]     Past Medical History  Patient Active Problem List    Diagnosis Date Noted   ? Wild-type transthyretin-related (ATTR) amyloidosis (HCC) 08/16/2019 Priority: High     Cardiac Amyloid Evaluation:??  ?  01/26/2019 Echo EF:?60%  IVS 1.50 cm (Range: 0.6 - 1.0)         LV PW 1.40 cm (Range: 0.6 - 1.0)         ?  1. Moderate concentric left ventricular hypertrophy  2. Unable to assess diastolic function due to atrial fibrillation  3. Moderate biatrial dilatation  4. Normal right ventricular size and systolic function  5. Normal central venous pressure  6. Mild calcific aortic valve stenosis. ?Mean gradient is 16 mmHg with a peak velocity of 2.8 m/s. ?Trace aortic valve regurgitation is seen  7. Mitral valve is structurally okay. ?There is moderate central regurgitation. ?No stenosis  8. Moderate tricuspid valve regurgitation  9. Pulmonary artery static pressure measures at 33 mmHg   06/28/2019 TC PYP Multiview planar views and SPECT imaging confirms the presence of severe marked diffuse global left ventricular and right ventricular myocardial uptake of tracer. ?There is normal rib and bone uptake.  ?  Two hour heart/contralateral lung (H/CL) ratio: ?1.977?this metric is abnormal and highly suggestive of ATTR cardiac amyloidosis.?  ?  Visual Semi-Quantitative Grading Scale Analysis:?  The study is grade 3 indicating myocardial uptake greater than rib and bone uptake. ?The pattern is highly and strongly suggestive of ATTR cardiac amyloidosis.  ?  SUMMARY/OPINION:   This study is abnormal and strongly suggestive of ATTR cardiac amyloidosis. ?Left ventricular myocardial uptake is diffuse, intense, and global. ?There is also prominent right ventricular free wall uptake of tracer. ?The pattern is typical for ATTR cardiac amyloidosis   ? Cardiac MRI ?   07/25/2019 Kappa/Lambda FLC ?  ? Ref. Range 07/25/2019 15:48   Kappa, FLC Latest Ref Range: 0.33 - 1.94 MG/DL 4.54 (H)   Lambda, FLC Latest Ref Range: 0.57 - 2.63 MG/DL 0.98 (H)   Kappa/Lambda FLC Latest Ref Range: 0.26 - 1.65  1.83 (H)      07/25/2019 Serum Immunofixation ?NO PARAPROTEIN SEEN   cancelled Urine Immunofixation ?Patient unable to provide urine sample 4/26    07/25/2019 Genetic Testing ?Negative   ? Biopsy (Fat Pad, Cardiac, Bone Marrow) ?   ? GI Symptoms ?   ?+? 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   ?  ? Ref. Range 07/25/2019 15:48   B Type Natriuretic Peptide Latest Ref Range: 0 - 100 PG/ML 966.0 (H)   Troponin-I Latest Ref Range: 0.0 - 0.05 NG/ML 0.05   ?  Completed:  -Tafamidis start 08/2019. PA approved, pt approved for [employer] patient assistance from 09/01/2019 to 03/30/2020  -Baseline KCCQ12 completed 5/18/202, Summary Score 61.46  -Baseline 6 min walk completed 08/16/2019, distance walked 950 feet, 289.75 meters, duration of test 6 minutes  -Baseline CPET completed 10/17/2019       ? Iron deficiency anemia 11/27/2020   ? Heart failure (HCC) 11/26/2020   ? AKI (acute kidney injury) (HCC) 11/26/2020   ? S/P TAVR (transcatheter aortic valve replacement) 10/25/2020   ? Hypokalemia 10/25/2020   ? Mitral regurgitation 09/06/2020   ? Nonrheumatic tricuspid valve regurgitation 09/06/2020   ? Nonrheumatic aortic valve stenosis 09/06/2020   ? Asymptomatic microscopic hematuria 12/25/2019   ? S/P left pulmonary artery pressure sensor implant placement 12/15/2019     *Patient has CardioMEMS (Pulmonary Artery Pressure Sensor)* Please call [name], CardioMEMs Program Coordinator [phone] or page Heart Failure Rounding Team if patient is admitted or presents to ED*          ? Chronic heart failure with preserved ejection fraction (HCC) 12/15/2019   ? Acute on chronic diastolic CHF (congestive heart failure), NYHA class 2 (HCC) 11/14/2019   ? S/P ablation of atrial fibrillation 05/18/2018   ? Lightheadedness 03/16/2018   ? Hospitalization within last 30 days 12/24/2017   ? Atrial fibrillation with RVR (HCC) 12/14/2017   ? Chronic kidney disease, stage 4 (severe) (HCC) 12/13/2017   ? Junctional bradycardia 12/12/2017   ? Bilateral leg edema 12/11/2017   ? On amiodarone therapy 12/11/2017   ? Heart failure with preserved ejection fraction (HCC) 01/01/2016   ? NSVT (nonsustained ventricular tachycardia) 06/12/2015   ? Medication side effects 11/09/2014   ? Heart palpitations 09/19/2014   ? Longstanding persistent atrial fibrillation (HCC) 03/17/2013     03/26/2018 - ECHO:  Moderate concentric LVH seen. The EF is about 55%.  The atria are slightly dilated.  The RV function is mildly decreased.  Mild to moderate MR and TR seen.  Mild AI is present.  Based on the gradients and appearance, the AS appears to be mild. The mean gradient is 16 mm Hg with a peak velocity of 2.6 m/s.  03/26/2018 - Zio Patch:  Predominant rhythm was normal sinus.  Two runs of ventricular tachycardia occurred, they were monomorphic, the longest run lasted 3 seconds, this episode occurred on April 08, 2018 at 9:41 PM.  One episode of AIVR also appeared to be present, it lasted approximately 4.6 seconds  Brief and rare episodes of SVT that probably represent atrial fibrillation, the longest lasting 6.3 seconds.  No evidence of atrial flutter and no evidence of high degree AV block.  05/12/2018 - LAAA:  Persistent Atrial Fibrillation status post successful pulmonary vein antral isolation.  Cavotricuspid Isthmus Ablation.  Ablation of Fractionated Intracardiac Electrograms.  Ablation for an Additional Discrete Arrhythmia-LA AFL after AFIB/PVI Ablation  06/23/2018 - Cardioversion:  Successful direct current cardioversion from symptomatic atrial fibrillation to sinus rhythm.  03/16/2019 - DCCV:  Successful direct current cardioversion from atrial fibrillation to sinus rhythm.     ? Systolic murmur of aorta 02/04/2013   ? SOB (shortness of breath) 02/04/2013   ? Carotid bruit present 01/23/2012   ? Overweight (BMI 25.0-29.9) 01/30/2011   ? Essential hypertension 01/30/2009   ? Hyperlipidemia 01/30/2009     Hyperlipidemia, on statin therapy     ? Skin cancer 01/30/2009   ? Seasonal allergic reaction 01/30/2009         Review of Systems   Constitutional: Negative.   HENT: Negative.    Eyes: Negative.    Cardiovascular: Negative.    Respiratory: Negative.    Endocrine: Negative.    Hematologic/Lymphatic: Negative.    Skin: Negative.    Musculoskeletal: Negative.    Gastrointestinal: Positive for constipation.   Genitourinary: Negative.    Neurological: Negative.    Psychiatric/Behavioral: Negative.    Allergic/Immunologic: Negative.        Physical Exam  General Appearance: In NAD.   Neck Veins: 7 cm JVP, neck veins are slightly distended, + HJR   Chest Inspection: Left subclavian incision clean, dry and intact  Respiratory Effort: breathing comfortably, no respiratory distress   Auscultation/Percussion: lungs clear to auscultation, no rales, rhonchi or wheezing   Cardiac Rhythm: irregularly irregular rhythm and normal rate   Cardiac Auscultation: S1, S2 normal, no rub, no definite S3  or S4   Murmurs: grade iii/vi systolic ejection murmur at left mid axillary line  Peripheral Circulation: normal peripheral circulation   Pedal Pulses: normal symmetric pedal pulses   Lower Extremity Edema: Trace nonpitting lower extremity edema, venous discoloration..  Abdominal Exam: soft non-tender, no obvious masses, bowel sounds normal   Gait & Station: walks without assistance   Orientation: oriented to person, place and time   Affect & Mood: appropriate and sustained affect   Language and Memory: patient responsive and seems to comprehend information   Neurologic Exam: neurological assessment grossly intact   Vital signs were reviewed      Cardiovascular Studies:    ? Echo 12/07/2020 the left ventricular systolic function is normal. The visually estimated ejection fraction is 55%.  ? The RV is moderately dilated with reduced systolic function. Pacemaker lead present in the ventricle.  ? Moderate biatrial dilatation.  ? Mitral annular calcification, no stenosis, severe mitral valve regurgitation.  ? Severe tricuspid valve regurgitation.  ? There is a well seated, normal functioning TAVR #26 Sapien S3 Ultra. Normal MG~11 mmHg,no paravalvular/transvalvular regurgitation.  ? Pulmonary hypertension, estimated PAP 46  mmHg.     Compared to the previous study dated 10/25/2020: The tricuspid valve regurgitation has progressed to severe, the pulmonary artery pressure previously was 32 mmHg.  ?     CHEST: 09/28/2020  1. Thickening and calcification of the aortic valve with a normal caliber   ascending thoracic aorta.   2. Development of several sub-5 mm nodules scattered throughout both   lungs. These are nonspecific though are most likely infectious or   inflammatory. In a low-risk patient no follow-up needed. In a high-risk   patient follow-up chest CT at 12 months is suggested. ?   3. Cardiomegaly and coronary artery disease   4. Pulmonary hypertension     ABDOMEN AND PELVIS:   1. Normal caliber abdominal aorta and common iliac arteries with moderate   diffuse atherosclerosis.   2. Moderate to severe focal stenosis at the origin of the SMA.   3. Moderate distal colonic diverticulosis. ?     6 Minute Walk Last Results 02/13/2021 06/26/2020 08/16/2019   SpO2:  Pre-Activity 96 99 99   Pulse:  POST-Activity 89 94 122   BP:  POST-Activity 150/62 126/76 151/76   SpO2:  POST-Activity 97 99 96   Distance Walked in Meters 305 152.5 289.75       Problems Addressed Today  Encounter Diagnoses   Name Primary?   ? Aortic stenosis, mild Yes       Assessment and Plan   Wild-type ATTR amyloidosis:   -He has had a previous technetium pyrophosphate scan on 06/28/2019 that showed a 2-hour heart/contralateral lung ratio of 1.97 with visual semiquantitative grading grade 3, abnormal and highly suggestive of ATTR cardiac amyloidosis.   - He has had kappa 7.45, lambda 4.07 and free light chain ratio 1.83 on 07/25/2019  -He had serum electrophoresis that showed no paraprotein.   -See 6-minute walk noted above  -He has completed a KCC Q score that was 72.92 indicative of fair to good quality of life.    -He is on tafamidis 61 mg daily.    -He had a baseline CPET on 7/19 2021,, his repeat cardiopulmonary exercise test on 11/11 showed moderately reduced aerobic capacity 54% of predicted VO2 maximum association with abnormally early onset of enterobiasis with anaerobic threshold 41% of VO2 max.  Very similar to his previous CPET in 2021.  -He had repeat TC PYP on 06/11/2020, it showed 2-hour heart/contralateral lung ratio reduced to 1.7, visual semiquantitative grading scale remains at 3.     Heart failure with preserved ejection fraction:  -His weight today is similar to previous office visits.  He is currently taking Bumex 6 mg twice daily, and is currently on metolazone 5 mg 4 days a week and all other days 2.5 mg.  His is CardioMEMS reading today was 26, threshold is 20-24,  CardioMEMS in situ: -12/15/19 IMPLANT RHC:?  BP:?126/76 (99)  RA:?27  RV:?60/32  PA:?57/31 (42)  PCWP:?30  TPG:?12  PVR:?3.8  SVR:?1840.  CO (Fick):?3.1  CI (Fick):?1.7?  AVO2 Diff 7.18?  Daily CardioMEMS Readings  ?Goal PA Diastolic:  22  PA Diastolic Thresholds:  20-24        Hypokalemia: He has last chemistry on 11/16 showed a potassium of 3.5 and creatinine 3.1.  Of note when he was hospitalized in August for his TAVR he did have a right heart cath on 8/29 that showed RA 20, RV 67/20, PA 67/25 with a mean of 39, wedge was 20, cardiac output by thermodilution was 4.4 with  index 1.9.    Severe aortic stenosis status post TAVR with Dr. Paris Lore in [place] on 7/27:   - He had CT chest and abdomen with contrast on 7/1 that showed thickening and calcification of the aortic valve with a normal caliber ascending thoracic aorta, several sub-5 mm nodules scattered throughout both lungs, cardiomegaly, coronary artery disease and pulmonary hypertension.    -He had postprocedure echo on 7/28 that showed prosthetic valve was normal, no stenosis or regurgitation.     Severe mitral regurgitation that is noted on most recent echo, in the future he may need a MitraClip.    CKD stage III: See above, follows with Dr. Mercie Eon.  Last creatinine was 3.1..       Atrial fibrillation: He continues to take amiodarone 200 mg p.o.daily, he is on oral anticoagulation with Xarelto 15 mg daily, he denies any upper or lower GI bleeding symptoms.  He has been in atrial fibrillation since at least 2020.     Permanent pacemaker in situ: He underwent permanent pacemaker insertion with Dr. Milas Kocher, most recent device evaluation on 02/06/2021 showed 5 episodes of atrial tachycardia, longest lasted 39 minutes and 15 seconds.  Last episode was on 10/21.    Right leg pain: He denies missing any of his Xarelto during recent trip to [place].  He is previously had a Doppler study of left lower extremity in August 2021 that showed small Baker's cyst.  Discussed possibly getting a repeat Doppler, he will let us know if the pain is not improving.    I have personally documented the HPI, exam and medical decision making.  Patient education: I reviewed recent lab results and current medications, medication instructions, discussed heart failure signs & symptoms,  low  sodium diet, fluid restriction and daily weights.I have instructed the patient on the plan of care and they verbalize understanding of the plan. Please see AVS for full patient teaching. Patient advised to call our office if s/he has any problems, questions, worsening symptoms, or concerns prior to the next appointment.   Thanks for allowing me to see this nice patient. If I can be of additional assistance, please don't hesitate to contact me.     [name] AGPCNP-BC, [facility]  Pager [phone]  Collaborating physician: Dr. Vanetta Shawl      Follow up: 3 months with Dr. [name].                    Current Medications (including today's revisions)  ? acetaminophen (TYLENOL) 325 mg tablet Take two tablets by mouth every 6 hours as needed.   ? allopurinoL (ZYLOPRIM) 300 mg tablet Take 300 mg by mouth daily. Take with food.   ? amiodarone (CORDARONE) 200 mg tablet Take one tablet by mouth daily.   ? bumetanide (BUMEX) 2 mg tablet Take three tablets by mouth twice daily.   ? cholecalciferol (vitamin D3) (VITAMIN D3) 1,000 units tablet Take 1,000 Units by mouth daily.   ? docusate (COLACE) 100 mg capsule Take 100 mg by mouth twice daily.   ? empagliflozin (JARDIANCE) 10 mg tablet Take one tablet by mouth daily.   ? ferrous sulfate (FEOSOL) 325 mg (65 mg iron) tablet Take 325 mg by mouth twice daily. Take on an empty stomach at least 1 hour before or 2 hours after food.    ? fluticasone/salmeterol (ADVAIR) 100/50 mcg inhalation disk Inhale 1 puff by mouth into the lungs daily.   ? lactobacillus rhamnosus (GG) (CULTURELLE) 10 billion cell cap  Take 1 capsule by mouth daily with breakfast.   ? magnesium oxide (MAG-OX) 400 mg (241.3 mg magnesium) tablet Take one tablet by mouth daily.   ? metOLazone (ZAROXOLYN) 5 mg tablet Take one tablet by mouth four times weekly AND one-half tablet twice daily twice weekly. Do all this for 180 days. Monday-Wednesday-Friday   ? omeprazole DR (PRILOSEC) 20 mg capsule Take one capsule by mouth daily before breakfast.   ? polyethylene glycol 3350 (MIRALAX) 17 g packet Take one packet by mouth as Needed.   ? potassium chloride SR (KLOR-CON M20) 20 mEq tablet Take three tablets by mouth three times daily.   ? rivaroxaban (XARELTO) 15 mg tablet Take one tablet by mouth daily with breakfast. Take with food.   ? rosuvastatin (CRESTOR) 5 mg tablet Take one tablet by mouth three times weekly.   ? senna (SENOKOT) 8.6 mg tablet Take 2 tablets by mouth twice daily as needed for Constipation.   ? sildenafiL (VIAGRA) 100 mg tablet TAKE 1 TABLET BY MOUTH ONCE DAILY AS NEEDED FOR SEXUAL ACTIVITY   ? tafamidis (VYNDAMAX) 61 mg capsule Take one capsule by mouth daily. ? VENTOLIN HFA 90 mcg/actuation inhaler Inhale 1 puff by mouth into the lungs as Needed.

## 2021-02-26 NOTE — Progress Notes
Daily CardioMEMS Readings    Goal PA Diastolic: 22 mmHg   PA Diastolic Thresholds: 123456 mmHg

## 2021-02-28 ENCOUNTER — Encounter: Admit: 2021-02-28 | Discharge: 2021-02-28 | Payer: MEDICARE

## 2021-02-28 ENCOUNTER — Ambulatory Visit: Admit: 2021-02-28 | Discharge: 2021-02-28 | Payer: MEDICARE

## 2021-02-28 DIAGNOSIS — I5032 Chronic diastolic (congestive) heart failure: Secondary | ICD-10-CM

## 2021-03-01 ENCOUNTER — Encounter: Admit: 2021-03-01 | Discharge: 2021-03-01 | Payer: MEDICARE

## 2021-03-04 ENCOUNTER — Encounter: Admit: 2021-03-04 | Discharge: 2021-03-04 | Payer: MEDICARE

## 2021-03-05 ENCOUNTER — Encounter: Admit: 2021-03-05 | Discharge: 2021-03-05 | Payer: MEDICARE

## 2021-03-05 MED ORDER — AMIODARONE 400 MG PO TAB
400 mg | ORAL_TABLET | Freq: Every day | ORAL | 1 refills | 42.00000 days | Status: AC
Start: 2021-03-05 — End: ?

## 2021-03-05 NOTE — Telephone Encounter
Called patient to discuss starting beta blocker, patient reports reaction from previous beta blocker of unable to breathe, "body shutting down" and having to be life flighted to [facility]. Patient reluctant to start beta blocker again. Consulted with Dr. Manuella Ghazi, would like to try increasing amiodarone to 400mg . Patient agreeable to increase amiodarone and understands need to continue monitoring for amiodarone toxicity.       Geronimo Running, APRN-NP  Donald Prose, RN  Discussed with Dr. Manuella Ghazi, please have him try this, and we will need to make sure his Mems is closer to 24. DM          Previous Messages    ----- Message -----   From: Donald Prose, RN   Sent: 03/04/2021  5:30 PM CST   To: Geronimo Running, APRN-NP   Subject: RE: ZUS - Non sustained 10 beats V tach pace*     Hi Dana,     Patient is allergic to beta blockers. Causes shortness of breath. States he had toxicity from one dose of carvedilol. Would you like to try something different for him?     Thanks,   Latwan Luchsinger   ----- Message -----   From: Geronimo Running, APRN-NP   Sent: 03/04/2021 12:35 PM CST   To: Donald Prose, RN   Subject: RE: ZUS - Non sustained 10 beats V tach pace*     Let's have him try to take toprol xl 12.5 mg at HS.   Please let me know if you are dizzy, light headed or have BP<90s/50s.     DM   ----- Message -----   From: Donald Prose, RN   Sent: 03/01/2021  4:50 PM CST   To: Geronimo Running, APRN-NP   Subject: FW: ZUS - Non sustained 10 beats V tach pace*     Hello Hinton Dyer,     You saw this patient 11/29, the day after this VT episode. Do you have any changes or recommendations you would like for him?     Thank you,   Edman Lipsey   ----- Message -----   From: Humberto Seals   Sent: 03/01/2021  2:56 PM CST   To: [facility] Nurse Hf Team [place]   Subject: ZUS - Non sustained 10 beats V tach pacemake*     Hi Team,     Patient had 10 beats non sustained V Tach happened on 02/25/2021. See for report and follow up with the patient as needed.     Thank you,

## 2021-03-08 ENCOUNTER — Encounter: Admit: 2021-03-08 | Discharge: 2021-03-08 | Payer: MEDICARE

## 2021-03-08 NOTE — Telephone Encounter
Ray Running, APRN-NP  [name]  Caller: Unspecified (Today, 10:58 AM)  Thanks Manuela Schwartz, I am glad to hear that he is feeling well after starting the Toprol-XL. It looks like his mems readings have been very stable, no changes in the plan of care. DM

## 2021-03-08 NOTE — Telephone Encounter
PC to pt for symptom check. Per pt's PHI, spoke with Pt's spouse who is with pt. They report that the patient is feeling just fine. He denies any cardiac concerns or signs or symptoms of hypervolemia. Pt and spouse will continue to monitor his symptoms, continue with current meds and notify us if they have any questions or concerns. PAD's sent to provider as FYI. Pt's readings stable but slightly above his thresholds.       DAILY CardioMEMS READINGS    Goal PA Diastolic: 22-23 mmHg  PA Diastolic Thresholds: 20-24 mmHg        CARDIAC MEDS:    Home Medications    Medication Sig   amiodarone (PACERONE) 400 mg tablet Take one tablet by mouth daily.   bumetanide (BUMEX) 2 mg tablet Take three tablets by mouth twice daily.   empagliflozin (JARDIANCE) 10 mg tablet Take one tablet by mouth daily.   fluticasone/salmeterol (ADVAIR) 100/50 mcg inhalation disk Inhale 1 puff by mouth into the lungs daily.   magnesium oxide (MAG-OX) 400 mg (241.3 mg magnesium) tablet Take one tablet by mouth daily.   metOLazone (ZAROXOLYN) 5 mg tablet Take one tablet by mouth four times weekly AND one-half tablet twice daily twice weekly. Do all this for 180 days. Monday-Wednesday-Friday   potassium chloride SR (KLOR-CON M20) 20 mEq tablet Take three tablets by mouth three times daily.   rivaroxaban (XARELTO) 15 mg tablet Take one tablet by mouth daily with breakfast. Take with food.   rosuvastatin (CRESTOR) 5 mg tablet Take one tablet by mouth three times weekly.   sildenafiL (VIAGRA) 100 mg tablet TAKE 1 TABLET BY MOUTH ONCE DAILY AS NEEDED FOR SEXUAL ACTIVITY   tafamidis (VYNDAMAX) 61 mg capsule Take one capsule by mouth daily.       RECENT LABS:    Basic Metabolic Profile    Lab Results   Component Value Date/Time    NA 134 (L) 02/11/2021 12:00 AM    K 3.9 02/11/2021 12:00 AM    CA 10.1 (H) 02/11/2021 12:00 AM    CL 90 (L) 02/11/2021 12:00 AM    CO2 28.0 02/11/2021 12:00 AM    GAP 16 02/11/2021 12:00 AM    Lab Results   Component Value Date/Time    BUN 119.0 (>) 02/11/2021 12:00 AM    CR 3.1 (H) 02/13/2021 11:15 AM    CR 3.13 (H) 02/11/2021 12:00 AM    GLU 86 02/11/2021 12:00 AM          UPCOMING APPOINTMENTS:    Future Appointments   Date Time Provider Department Center   03/28/2021  8:00 AM Robyne Askew, MD [facility]   04/16/2021  9:00 AM Robyne Askew, MD [facility] IM   05/22/2021  2:30 PM Vanetta Shawl I, MD [facility] Exam   05/31/2021  9:30 AM [facility] Procedur   06/26/2021  3:00 PM [name], MD [facility]

## 2021-03-12 ENCOUNTER — Encounter: Admit: 2021-03-12 | Discharge: 2021-03-12 | Payer: MEDICARE

## 2021-03-12 DIAGNOSIS — I5030 Unspecified diastolic (congestive) heart failure: Secondary | ICD-10-CM

## 2021-03-12 DIAGNOSIS — I5032 Chronic diastolic (congestive) heart failure: Secondary | ICD-10-CM

## 2021-03-12 LAB — BASIC METABOLIC PANEL
CALCIUM: 9 K/UL
CHLORIDE: 89 % — ABNORMAL LOW (ref 98–107)
CO2: 27 % (ref 23–31)
CREATININE: 3.4 % — ABNORMAL HIGH (ref 0.72–1.25)
GLUCOSE,PANEL: 146 K/UL — ABNORMAL HIGH (ref 70–105)
SODIUM: 131 FL — ABNORMAL LOW (ref 136–145)

## 2021-03-12 MED ORDER — METOLAZONE 5 MG PO TAB
5 mg | ORAL_TABLET | Freq: Every day | ORAL | 1 refills | 84.00000 days | Status: AC
Start: 2021-03-12 — End: ?

## 2021-03-12 MED ORDER — POTASSIUM CHLORIDE 10 MEQ PO TBTQ
60 meq | ORAL_TABLET | Freq: Three times a day (TID) | ORAL | 3 refills | 30.00000 days | Status: AC
Start: 2021-03-12 — End: ?

## 2021-03-12 NOTE — Telephone Encounter
PC to pt. Pt states he would like to try to increase his Metolazone to 5mg  daily and repeat his BMP at [facility] for now. BMP standing order on file at [facility] for pt. Pt states he will go to have lab drawn now. If he does not have improvement in his symptoms he will call back and see [name], APRN on Friday December 16th.  Provider agreeable to plan. Potassium 10 mEq script sent to [facility] at pt request d/t size and difficulty swallowing.

## 2021-03-12 NOTE — Telephone Encounter
Ray Running, APRN-NP to [name]       If he needs to be seen I'm happy to see him (OK to overbook). If he would like to try to increase the metolazone to 5 mg daily and repeat a chem I'm ok with that. DM

## 2021-03-12 NOTE — Telephone Encounter
PC to pt for Sx check since PAD's above thresholds X 2 days. Pt reports taking Metolazone 5mg  past 2 days without much increase in his urine output. He states he is bloated and uncomfortable.  His energy level is down and he is not feeling the best.  He states he is still active but it takes him much longer to accomplish anything.  His weight has increased approx 3 lbs in the past 3 days. His PO intake is about the same as usual as he states he struggles to keep it low because he has to get all his potassium pills down.  He requests kdur 10 mEq be called to [facility] since the 20 mEq are hard to swallow when he breaks them in 1/2 the sharp edge on the tablet makes him chock. Pt has not had BMP since 02/13/21. He also is wondering when he should be seen by [facility] provider again.     Medications reviewed with patient. Pt reports Taking Amiodarone 400mg  daily since 03/05/21. He states he is unable to take beta blockers.     SOA with activity: Yes  SOA at rest: Yes  Edema: Yes None in his legs. Reports bloating in abdomen.  Cough:No  Chest pain: No  Palpitations/heart racing: No  Orthopnea: No  PND: Yes  Weight gain: Yes  Dizziness/Ligheadedness: No  Other: Yes- Pt states the wound on right lower leg appears to be healing and he is encouraged by this.   Recent Fevers: No       Blood Pressure Weight  (Lbs) Heart Rate   03/12/2021 138/64 158 84   03/11/2021 134/66 157 84   03/10/2021 127/66 156 73   03/09/2021 136/64 155 81   03/08/2021  155 81   03/07/2021  156 88   03/06/2021  154 84       DAILY CardioMEMS READINGS    Goal PA Diastolic: 22-23 mmHg  PA Diastolic Thresholds: 20-24 mmHg          CARDIAC MEDS:    Home Medications    Medication Sig   amiodarone (PACERONE) 400 mg tablet Take one tablet by mouth daily.   bumetanide (BUMEX) 2 mg tablet Take three tablets by mouth twice daily.   empagliflozin (JARDIANCE) 10 mg tablet Take one tablet by mouth daily.   metOLazone (ZAROXOLYN) 5 mg tablet Take one tablet by mouth four times weekly AND one-half tablet twice daily twice weekly. Do all this for 180 days. Monday-Wednesday-Friday   potassium chloride SR (KLOR-CON M20) 20 mEq tablet Take three tablets by mouth three times daily.   rivaroxaban (XARELTO) 15 mg tablet Take one tablet by mouth daily with breakfast. Take with food.   tafamidis (VYNDAMAX) 61 mg capsule Take one capsule by mouth daily.       RECENT LABS:    Basic Metabolic Profile    Lab Results   Component Value Date/Time    NA 134 (L) 02/11/2021 12:00 AM    K 3.9 02/11/2021 12:00 AM    CA 10.1 (H) 02/11/2021 12:00 AM    CL 90 (L) 02/11/2021 12:00 AM    CO2 28.0 02/11/2021 12:00 AM    GAP 16 02/11/2021 12:00 AM    Lab Results   Component Value Date/Time    BUN 119.0 (>) 02/11/2021 12:00 AM    CR 3.1 (H) 02/13/2021 11:15 AM    CR 3.13 (H) 02/11/2021 12:00 AM    GLU 86 02/11/2021 12:00 AM  UPCOMING APPOINTMENTS:    Future Appointments   Date Time Provider Department Center   03/28/2021  8:00 AM Robyne Askew, MD [facility]   04/16/2021  9:00 AM Robyne Askew, MD [facility] IM   05/22/2021  2:30 PM Arneta Cliche, MD [facility] Exam   05/31/2021  9:30 AM [place] [facility] C[facility] Procedur   06/26/2021  3:00 PM [name], MD [facility]

## 2021-03-13 ENCOUNTER — Encounter: Admit: 2021-03-13 | Discharge: 2021-03-13 | Payer: MEDICARE

## 2021-03-13 NOTE — Telephone Encounter
Ray Running, APRN-NP   03/12/2021 5:26 PM CST Back to Top      As we discussed Ray [name] his potassium is stable at 3.9, his creatinine is increasing at 3.43, I reviewed Ray [name] last note, she should see him again in March. Given that his CardioMEMS reading is still elevated above goal and he has abdominal bloating and distention and is uncomfortable lets continue with the plan of care with the metolazone 5 mg daily. Thanks, DM

## 2021-03-13 NOTE — Telephone Encounter
Called patient and discussed Ray Roys, APRN's recommendations and requested for patient to get a repeat BMP on Mon., 03/18/21. Patient verbalizes understanding and denies any questions, concerns or needs at this time.

## 2021-03-14 ENCOUNTER — Encounter: Admit: 2021-03-14 | Discharge: 2021-03-14 | Payer: MEDICARE

## 2021-03-14 NOTE — Telephone Encounter
Called and spoke with Helene Kelp to check on [name]. She confirmed he is down 2-3 pounds and feels good today. His cardioMEMs PADs are stable for past couple of days at 42. They do not feel it is necessary to see Hinton Dyer tomorrow and will call if anything changes.

## 2021-03-18 ENCOUNTER — Encounter: Admit: 2021-03-18 | Discharge: 2021-03-18 | Payer: MEDICARE

## 2021-03-18 DIAGNOSIS — I5032 Chronic diastolic (congestive) heart failure: Secondary | ICD-10-CM

## 2021-03-18 LAB — BASIC METABOLIC PANEL
ANION GAP: 15 (ref 8–16)
BLD UREA NITROGEN: 130 — ABNORMAL HIGH (ref 8.4–25.7)
CALCIUM: 9.3 (ref 8.8–10)
CHLORIDE: 91 — ABNORMAL LOW (ref 98–107)
CO2: 29 (ref 23–31)
CREATININE: 3.6 — ABNORMAL HIGH (ref 0.72–1.25)
GFR ESTIMATED: 17 — ABNORMAL LOW (ref >59)
GLUCOSE,PANEL: 129 — ABNORMAL HIGH (ref 70–105)
POTASSIUM: 3.6 (ref 3.5–5.1)
SODIUM: 135 — ABNORMAL LOW (ref 136–145)

## 2021-03-21 ENCOUNTER — Encounter: Admit: 2021-03-21 | Discharge: 2021-03-21 | Payer: MEDICARE

## 2021-03-21 DIAGNOSIS — I358 Other nonrheumatic aortic valve disorders: Secondary | ICD-10-CM

## 2021-03-21 DIAGNOSIS — J302 Other seasonal allergic rhinitis: Secondary | ICD-10-CM

## 2021-03-21 DIAGNOSIS — N189 Chronic kidney disease, unspecified: Secondary | ICD-10-CM

## 2021-03-21 DIAGNOSIS — E785 Hyperlipidemia, unspecified: Secondary | ICD-10-CM

## 2021-03-21 DIAGNOSIS — J45909 Unspecified asthma, uncomplicated: Secondary | ICD-10-CM

## 2021-03-21 DIAGNOSIS — Z8679 Personal history of other diseases of the circulatory system: Secondary | ICD-10-CM

## 2021-03-21 DIAGNOSIS — I10 Essential (primary) hypertension: Principal | ICD-10-CM

## 2021-03-21 DIAGNOSIS — I361 Nonrheumatic tricuspid (valve) insufficiency: Secondary | ICD-10-CM

## 2021-03-21 DIAGNOSIS — C4490 Unspecified malignant neoplasm of skin, unspecified: Secondary | ICD-10-CM

## 2021-03-21 DIAGNOSIS — I340 Nonrheumatic mitral (valve) insufficiency: Secondary | ICD-10-CM

## 2021-03-21 DIAGNOSIS — I350 Nonrheumatic aortic (valve) stenosis: Secondary | ICD-10-CM

## 2021-03-21 DIAGNOSIS — E859 Amyloidosis, unspecified: Secondary | ICD-10-CM

## 2021-03-26 ENCOUNTER — Encounter: Admit: 2021-03-26 | Discharge: 2021-03-26 | Payer: MEDICARE

## 2021-03-27 ENCOUNTER — Encounter: Admit: 2021-03-27 | Discharge: 2021-03-27 | Payer: MEDICARE

## 2021-03-28 ENCOUNTER — Ambulatory Visit: Admit: 2021-03-28 | Discharge: 2021-03-28 | Payer: MEDICARE

## 2021-03-28 ENCOUNTER — Encounter: Admit: 2021-03-28 | Discharge: 2021-03-28 | Payer: MEDICARE

## 2021-03-28 DIAGNOSIS — D485 Neoplasm of uncertain behavior of skin: Secondary | ICD-10-CM

## 2021-03-28 DIAGNOSIS — C44319 Basal cell carcinoma of skin of other parts of face: Secondary | ICD-10-CM

## 2021-03-28 DIAGNOSIS — C44212 Basal cell carcinoma of skin of right ear and external auricular canal: Secondary | ICD-10-CM

## 2021-03-28 MED ORDER — FLUOROURACIL 5 % TP CREA
Freq: Two times a day (BID) | TOPICAL | 1 refills | 24.00000 days | Status: AC
Start: 2021-03-28 — End: ?

## 2021-03-28 MED ORDER — CEPHALEXIN 500 MG PO CAP
2000 mg | Freq: Once | ORAL | 0 refills | Status: AC
Start: 2021-03-28 — End: ?

## 2021-03-28 MED ORDER — DOXYCYCLINE HYCLATE 100 MG PO CAP
100 mg | ORAL_CAPSULE | Freq: Two times a day (BID) | ORAL | 0 refills | 8.00000 days | Status: AC
Start: 2021-03-28 — End: ?

## 2021-03-28 MED FILL — VYNDAMAX 61 MG PO CAP: 61 mg | ORAL | 30 days supply | Qty: 30 | Fill #8 | Status: AC

## 2021-03-28 NOTE — Procedures
Mohs Micrographic Surgery Operative Note    Patient Name: Ray [name]  Date of Birth: 24-Mar-1936    Procedure: Mohs micrographic surgery  Surgeon and Pathologist: Kearney Hard, MD    Assistant Surgeon(s): Maryfrances Bunnell, MD  Referring MD: Robyne Askew  Date of Service: 03/28/2021    A detailed discussion of the diagnosis, prognosis, and treatment options including Mohs micrographic surgery, wide local excision, radiation, destruction, and chemotherapy was performed. Based on my medical judgment, Mohs surgery is medically necessary because it is the most appropriate treatment for this cancer compared to other treatments. The rationale for Mohs surgery was explained to the patient and patient opted for Mohs surgery. The risks, benefits, and alternatives to treatment were discussed in detail. Specifically, the risks of pain, infection, swelling, bleeding, bruising, scarring, altered appearance, prolonged wound healing, incomplete removal, allergy to anesthesia, nerve injury, functional impairment, and recurrence discussed. Patient also informed that the skin cancer removal will result in a defect in the skin and soft tissue that may require repair such as a primary closure, flap, graft, or closure in the operating room if needed. If the surgery reveals advanced disease, further resection in the operating room or adjuvant treatment with radiation and/or chemotherapy may be recommended.  All questions answered. Written and verbal informed consent obtained.    Prior to the procedure, a time-out was performed in which the patient's identity was verified and treatment site(s) were clearly identified and cleaned with alcohol prep pads, marked with surgical marker, and confirmed by the patient and surgeons.  All components of time-out protocol completed. Kearney Hard, MD operated in two distinct and integrated capacities as the surgeon and pathologist.      Lesion A  Mohs Case Number:  Site:  Pre Op Dx:  Post Op Dx:  Tumor Recurrence Status:  PreOp SIze:  PostOp Size:  Number of Stages:  Repair Type(s):  Final Wound Length (cm)/ Area of Flap or Graft (cm2):  Anesthesia: local infiltration  Skin Prep:   [id number]  Right preauricular cheek superior & inferior (lesions A & B treated as one  Basal cell carcinoma (infiltrative & nodular)  Basal cell carcinoma (infiltrative & nodular)  Primary  3.2 x 2.1 cm for lesion A; 2.6 x 1.4 for lesion B  3.5 x 6.3 cm  2  Full thickness skin graft & partial intermediate repair  24 cm2 FTSG; 2.0 cm intermediate repair  Lidocaine 1.0% soln w epinephrine    Chlorhexidine        Total Anesthesia per stage and/or repair (mL): 12/ 6 / 24 /  /  /  /  /  /  /  /     Estimated Blood loss:  Minimal  Complications:  None    Indications for Mohs Micrographic Surgery:    The patient has a biopsy- proven Primary Basal cell carcinoma (infiltrative & nodular)  located on the Right preauricular cheek superior & inferior     Removal of the patient's tumor is complicated by the following clinical features:    Close proximity to embryonic planes, Located in area of high recurrence, Ill defined margins, Involvement of sensitive cosmetic/functional structure, Suspected deep tissue invasion of tumor requiring margin evaluation, Aggressive histologic subtype, Immunosuppression and Large size    Stage 1:      The patient was reclined on the surgery table. The site was prepped with antiseptic, infiltrated with local anesthesia, and draped. The surgical field was again prepped with antiseptic. All visible tumor  was completely debulked using a curette and/or scalpel. An excision was made 2-54mm around the debulking defect following standard Mohs technique. Hemostasis was achieved with spot electrocautery. Pressure dressing was applied. Tissue was carefully divided into number specimen(s) indicated below, which was oriented, color coded using ink, and mapped. The specimen was then given to the technician for frozen sectioning, mounting, and staining using the Mohs protocol. Frozen section analysis showed residual tumor in number of specimens as indicated below.   Number of Specimen(s):  Number of positive specimen(s): 5  2 Presence of dense inflammation suspicious for residual tumor & infiltrative basal cell carcinoma in the dermis as indicated in red on the attached Mohs map.       Stage 2:    The patient was prepped and draped in same fashion as the first stage. Using the Mohs technique, a thin layer of tissue was removed around all areas where tumor was visible on the previous stage. Hemostasis was achieved with spot electrocautery. Pressure dressing was applied. Tissue was carefully divided into number of specimen(s) indicated below, which was oriented, color coded using ink, mapped, and processed as above using the Mohs protocol. Frozen section analysis showed residual tumor in number of specimens indicated below.  Number of Specimen(s):  Number of positive specimen(s): 2  0 Microscopic examination of tissue revealed margins clear of tumor.       Repair Note:  Full thickness skin graft (for superior portion of wound)    Repair Type:  full thickness skin graft (FTSG)  Site: right preauricular  Donor Site: right arm  Area of Graft:  24 cm2  Donor site suture:  3-0& 4-0 monocryl dermal and 5-0 fast absorbing gut epidermal  Graft securing and tacking suture:  5-0 fast absorbing gut  Estimated blood loss:  < 5mL  Complications:  None.  Patient was discharged in stable condition.    Indications:  This patient was left with a skin and soft tissue defect following Mohs surgery.  A variety of closure modalities were discussed with the patient and it was decided that a full thickness skin graft repair would best preserve normal anatomical and functional relationships given size and location of the defect and proximity to free margins.  Graft repair was performed because damaged skin surrounding the defect made closure difficult, because inelasticity of skin made closure difficult, because there is insufficient tissue mobility to close the wound with local tissue, to avoid anatomic distortion and/or functional deficit in adjacent fixed structures, to preserve normal anatomy, to protect underlying structures where insufficient local skin is available for primary direct repair, to reduce tension to reduce risk of skin necrosis, infection, and wound dehiscence, and to reduce tension to enhance both functional and cosmetic results.  After discussing the risks including pain, bleeding, bruising, swelling, infection, scarring, altered appearance, contour deformity, necrosis, wound dehiscence, nerve damage and need for revision, informed consent was obtained and the patient underwent the procedure as follows:    Procedure:  The patient was taken to the operative suite and placed on the surgery table.  A template of the primary defect was fashioned using non-adherent dressing.  Using a surgical marker, the primary defect shape was transferred to the donor site.  The areas were prepped with antiseptic, infiltrated with local anesthesia, re-cleansed with antiseptic, and then prepped with sterile drapes.  The outlined template was incised deep to adipose tissue with a #15 blade.  The donor site was undermined using iris scissors and a #15 blade.  Redundant tissue cones were excised.  Hemostasis achieved with electrocautery.  The donor site was closed with dermal buried vertical mattress sutures and epidermal simple running sutures.  The primary defect was debrided back to clean healthy bleeding tissue.  The harvested graft was then trimmed of fat and excess dermis, rinsed in saline and placed over the primary defect. It was then rotated until a precise fit was obtained. The graft was then sutured to the edges of the primary defect using 5-0 fast absorbing gut running and interrupted sutures. The central portion of the graft was secured to the recipient wound bed using simple interrupted basting 5-0 fast absorbing gut sutures. Bolster dressing was sewn over the graft with 4-0 prolene suture to improve graft survival.     Repair Note: Intermediate Repair (for inferior portion of wound)    Repair Type:  intermediate repair  Dermal and subcutaneous suture:  4-0 monocryl  Epidermal suture:  5-0 fast gut  Estimated blood loss:  < 5mL  Complications:  None.  Patient was discharged in stable condition.    Procedure:  The patient was reclined on the surgery table.  The surgical defect and surrounding skin were prepped with antiseptic, infiltrated with local anesthesia, re-cleansed with antiseptic, and then prepped with sterile drapes.  Defect skin edges were de-beveled, undermining performed, and redundant tissue cones removed.  Hemostasis was achieved with spot electrocautery.  The dermis and subcutaneous tissue were closed with buried vertical mattress sutures.  The epidermis was closed using simple running sutures.  White petrolatum and pressure dressing applied to donor and graft sites.  Verbal and written wound care instructions were given.  RTC for suture removal and/or post-op check as directed. The patient will be encouraged to follow up with the referring dermatologist.     The patient was prescribed Doxycycline 100mg  one tab orally BID for 7 days.   Side effects including but not limited to GI upset, esophagitis, sun sensitivity, teratogenicity, and allergic reaction reviewed.  Patient will discontinue medication and contact clinic if there are any issues.  Patient expressed understanding of potential adverse effects.    Post-operative Plan:  - Written and verbal wound care instructions reviewed  - Advised long-term follow-up with referring dermatologist for skin cancer surveillance  - Sun protection reviewed  - Scar management reviewed  - RTC as directed for suture removal and/or wound check

## 2021-03-29 ENCOUNTER — Encounter: Admit: 2021-03-29 | Discharge: 2021-03-29 | Payer: MEDICARE

## 2021-03-29 NOTE — Telephone Encounter
Geronimo Running, APRN-NP  [name]; Bartholomew Boards, MD; Denita Lung, MD    I really don't want to increase his diuretics with his BUN 130. I am happy to see him in person on 1/3 in [facility], Dr. Manuella Ghazi will be at same location. I think Dr. Fara Olden is looking at earlier appointment. DM

## 2021-03-29 NOTE — Telephone Encounter
PC to pt for symptom check since PAD elevated at 28 mmHg today. Pt reports surgery yesterday for MOHS procedure was really rough. He states he was in OR much longer than expected due to complications with bleeding and needed a skin graft which he was not expecting. Pt denies any concerns with fluid overload today. He is aware of after hours phone # to reach Cardiologist on-call if he has concerns over holiday weekend.        DAILY CardioMEMS READINGS    Goal PA Diastolic: 21-22 mmHg  PA Diastolic Thresholds: 20-24 mmHg        CARDIAC MEDS:    Home Medications    Medication Sig   acetaminophen (TYLENOL) 325 mg tablet Take two tablets by mouth every 6 hours as needed.   allopurinoL (ZYLOPRIM) 300 mg tablet Take 300 mg by mouth daily. Take with food.   amiodarone (PACERONE) 400 mg tablet Take one tablet by mouth daily.   bumetanide (BUMEX) 2 mg tablet Take three tablets by mouth twice daily.   cholecalciferol (vitamin D3) (VITAMIN D3) 1,000 units tablet Take 1,000 Units by mouth daily.   docusate (COLACE) 100 mg capsule Take 100 mg by mouth twice daily.   doxycycline hyclate (VIBRAMYCIN) 100 mg capsule Take one capsule by mouth twice daily for 7 days. Take with food and water. Do not take within 2 hours of calcium supplements or dairy products.   empagliflozin (JARDIANCE) 10 mg tablet Take one tablet by mouth daily.   ferrous sulfate (FEOSOL) 325 mg (65 mg iron) tablet Take 325 mg by mouth twice daily. Take on an empty stomach at least 1 hour before or 2 hours after food.    fluorouraciL (EFUDEX) 5 % topical cream Apply to rough spots on hands and arms twice daily for 4 weeks   fluticasone/salmeterol (ADVAIR) 100/50 mcg inhalation disk Inhale 1 puff by mouth into the lungs daily.   lactobacillus rhamnosus (GG) (CULTURELLE) 10 billion cell cap Take 1 capsule by mouth daily with breakfast.   magnesium oxide (MAG-OX) 400 mg (241.3 mg magnesium) tablet Take one tablet by mouth daily.   metOLazone (ZAROXOLYN) 5 mg tablet Take one tablet by mouth daily. 03/12/2021 Take Metolazone 5 mg 30-60 minutes PRIOR to Bumex.   omeprazole DR (PRILOSEC) 20 mg capsule Take one capsule by mouth daily before breakfast.   polyethylene glycol 3350 (MIRALAX) 17 g packet Take one packet by mouth as Needed.   potassium chloride SR (K-DUR) 10 mEq tablet Take six tablets by mouth three times daily.   rivaroxaban (XARELTO) 15 mg tablet Take one tablet by mouth daily with breakfast. Take with food.   rosuvastatin (CRESTOR) 5 mg tablet Take one tablet by mouth three times weekly.   senna (SENOKOT) 8.6 mg tablet Take 2 tablets by mouth twice daily as needed for Constipation.   sildenafiL (VIAGRA) 100 mg tablet TAKE 1 TABLET BY MOUTH ONCE DAILY AS NEEDED FOR SEXUAL ACTIVITY   tafamidis (VYNDAMAX) 61 mg capsule Take one capsule by mouth daily.   VENTOLIN HFA 90 mcg/actuation inhaler Inhale 1 puff by mouth into the lungs as Needed.       RECENT LABS:    Basic Metabolic Profile    Lab Results   Component Value Date/Time    NA 135 (L) 03/18/2021 12:00 AM    K 3.6 03/18/2021 12:00 AM    CA 9.3 03/18/2021 12:00 AM    CL 91 (L) 03/18/2021 12:00 AM    CO2 29 03/18/2021 12:00  AM    GAP 15 03/18/2021 12:00 AM    Lab Results   Component Value Date/Time    BUN 130 (H) 03/18/2021 12:00 AM    CR 3.65 (H) 03/18/2021 12:00 AM    GLU 129 (H) 03/18/2021 12:00 AM          UPCOMING APPOINTMENTS:    Future Appointments   Date Time Provider Department Center   04/04/2021 11:00 AM [facility] DERM NURSE [facility]   04/16/2021  9:00 AM Robyne Askew, MD [facility] IM   05/22/2021  2:30 PM Vanetta Shawl I, MD [facility] C[facility] Exam   05/31/2021  9:30 AM [place] [facility] 3 [facility] CVM Procedur   06/26/2021  3:00 PM [name], MD [facility]

## 2021-04-02 ENCOUNTER — Ambulatory Visit: Admit: 2021-04-02 | Discharge: 2021-04-03 | Payer: MEDICARE

## 2021-04-02 ENCOUNTER — Encounter: Admit: 2021-04-02 | Discharge: 2021-04-02 | Payer: MEDICARE

## 2021-04-02 DIAGNOSIS — I10 Essential (primary) hypertension: Secondary | ICD-10-CM

## 2021-04-02 DIAGNOSIS — I350 Nonrheumatic aortic (valve) stenosis: Secondary | ICD-10-CM

## 2021-04-02 DIAGNOSIS — N189 Chronic kidney disease, unspecified: Secondary | ICD-10-CM

## 2021-04-02 DIAGNOSIS — C4490 Unspecified malignant neoplasm of skin, unspecified: Secondary | ICD-10-CM

## 2021-04-02 DIAGNOSIS — Z8679 Personal history of other diseases of the circulatory system: Secondary | ICD-10-CM

## 2021-04-02 DIAGNOSIS — J302 Other seasonal allergic rhinitis: Secondary | ICD-10-CM

## 2021-04-02 DIAGNOSIS — E8582 Wild-type transthyretin-related (ATTR) amyloidosis: Secondary | ICD-10-CM

## 2021-04-02 DIAGNOSIS — I340 Nonrheumatic mitral (valve) insufficiency: Secondary | ICD-10-CM

## 2021-04-02 DIAGNOSIS — E785 Hyperlipidemia, unspecified: Secondary | ICD-10-CM

## 2021-04-02 DIAGNOSIS — I361 Nonrheumatic tricuspid (valve) insufficiency: Secondary | ICD-10-CM

## 2021-04-02 DIAGNOSIS — E859 Amyloidosis, unspecified: Secondary | ICD-10-CM

## 2021-04-02 DIAGNOSIS — I358 Other nonrheumatic aortic valve disorders: Secondary | ICD-10-CM

## 2021-04-02 DIAGNOSIS — J45909 Unspecified asthma, uncomplicated: Secondary | ICD-10-CM

## 2021-04-02 NOTE — Progress Notes
Date of Service: 04/02/2021    Ray [name] is a 86 y.o. male.     Due to the [facility] Emergency the patient was seen via video telehealth.    HPI      I had the pleasure of seeing Ray [name], who is accompanied by his wife [name] today via telehealth for amyloidosis follow-up.     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    I had last seen him in clinic on 11/29, at that office visit he had been doing well, I had made no medication changes and I had asked for 93-month appointment with Dr. Dickie La.      [name] had a worrisome CardioMEMS reading on 12/31 with a PA diastolic of 34.  He tells me that he does not think the reading was accurate at that day it had prompted him to take it 4-5 times.  Of note he had a skin biopsy on 12/29 that was very difficult, he tells me he was here from 8 AM to 6 PM, they had to repeat biopsy multiple times to get good margins.  He tells me his right arm now has 22 stitches.  Today he reports that he had just got done taking down the Christmas lights, he says his legs look good, he is wearing compression stockings and has no significant edema.  His home weight has been stable between 157 to 158 pounds.  Home blood pressure is 130/60-130 2/62.  He states initially when he was on metolazone 5 mg he would have good diuresis, he feels like it is not as effective as it used to be.  He continues to have some abdominal bloating, denies any constipation.  He denies orthopnea, PND, chest pain or palpitations.            Vitals:    04/02/21 1413   BP: 133/61   BP Source: Arm, Left Upper   Pulse: 70   O2 Device: None (Room air)   PainSc: Zero   Weight: 71.2 kg (157 lb)   Height: 170.2 cm (5' 7)     Body mass index is 24.59 kg/m?[name]     Past Medical History  Patient Active Problem List    Diagnosis Date Noted   ? 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  ?  01/26/2019 Echo EF:?60%  IVS 1.50 cm (Range: 0.6 - 1.0)         LV PW 1.40 cm (Range: 0.6 - 1.0)         ?  1. Moderate concentric left ventricular hypertrophy  2. Unable to assess diastolic function due to atrial fibrillation  3. Moderate biatrial dilatation  4. Normal right ventricular size and systolic function  5. Normal central venous pressure  6. Mild calcific aortic valve stenosis. ?Mean gradient is 16 mmHg with a peak velocity of 2.8 m/s. ?Trace aortic valve regurgitation is seen  7. Mitral valve is structurally okay. ?There is moderate central regurgitation. ?No stenosis  8. Moderate tricuspid valve regurgitation  9. Pulmonary artery static pressure measures at 33 mmHg   06/28/2019 TC PYP Multiview planar views and SPECT imaging confirms the presence of severe marked diffuse global  left ventricular and right ventricular myocardial uptake of tracer. ?There is normal rib and bone uptake.  ?  Two hour heart/contralateral lung (H/CL) ratio: ?1.977?this metric is abnormal and highly suggestive of ATTR cardiac amyloidosis.?  ?  Visual Semi-Quantitative Grading Scale Analysis:?  The study is grade 3 indicating myocardial uptake greater than rib and bone uptake. ?The pattern is highly and strongly suggestive of ATTR cardiac amyloidosis.  ?  SUMMARY/OPINION:   This study is abnormal and strongly suggestive of ATTR cardiac amyloidosis. ?Left ventricular myocardial uptake is diffuse, intense, and global. ?There is also prominent right ventricular free wall uptake of tracer. ?The pattern is typical for ATTR cardiac amyloidosis   ? Cardiac MRI ?   07/25/2019 Kappa/Lambda FLC ?  ? Ref. Range 07/25/2019 15:48   Kappa, FLC Latest Ref Range: 0.33 - 1.94 MG/DL 0.98 (H)   Lambda, FLC Latest Ref Range: 0.57 - 2.63 MG/DL 1.19 (H)   Kappa/Lambda FLC Latest Ref Range: 0.26 - 1.65  1.83 (H)      07/25/2019 Serum Immunofixation ?NO PARAPROTEIN SEEN   cancelled Urine Immunofixation ?Patient unable to provide urine sample 4/26    07/25/2019 Genetic Testing ?Negative   ? Biopsy (Fat Pad, Cardiac, Bone Marrow) ?   ? GI Symptoms ?   ?+? 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   ?  ? Ref. Range 07/25/2019 15:48   B Type Natriuretic Peptide Latest Ref Range: 0 - 100 PG/ML 966.0 (H)   Troponin-I Latest Ref Range: 0.0 - 0.05 NG/ML 0.05   ?  Completed:  -Tafamidis start 08/2019. PA approved, pt approved for [employer] patient assistance from 09/01/2019 to 03/30/2020  -Baseline KCCQ12 completed 5/18/202, Summary Score 61.46  -Baseline 6 min walk completed 08/16/2019, distance walked 950 feet, 289.75 meters, duration of test 6 minutes  -Baseline CPET completed 10/17/2019       ? Iron deficiency anemia 11/27/2020   ? Heart failure (HCC) 11/26/2020   ? AKI (acute kidney injury) (HCC) 11/26/2020   ? S/P TAVR (transcatheter aortic valve replacement) 10/25/2020   ? Hypokalemia 10/25/2020   ? Mitral regurgitation 09/06/2020   ? Nonrheumatic tricuspid valve regurgitation 09/06/2020   ? Nonrheumatic aortic valve stenosis 09/06/2020   ? Asymptomatic microscopic hematuria 12/25/2019   ? S/P left pulmonary artery pressure sensor implant placement 12/15/2019     *Patient has CardioMEMS (Pulmonary Artery Pressure Sensor)* Please call [name], CardioMEMs Program Coordinator [phone] or page Heart Failure Rounding Team if patient is admitted or presents to ED*          ? Chronic heart failure with preserved ejection fraction (HCC) 12/15/2019   ? Acute on chronic diastolic CHF (congestive heart failure), NYHA class 2 (HCC) 11/14/2019   ? S/P ablation of atrial fibrillation 05/18/2018   ? Lightheadedness 03/16/2018   ? Hospitalization within last 30 days 12/24/2017   ? Atrial fibrillation with RVR (HCC) 12/14/2017   ? Chronic kidney disease, stage 4 (severe) (HCC) 12/13/2017   ? Junctional bradycardia 12/12/2017   ? Bilateral leg edema 12/11/2017   ? On amiodarone therapy 12/11/2017   ? Heart failure with preserved ejection fraction (HCC) 01/01/2016   ? NSVT (nonsustained ventricular tachycardia) 06/12/2015   ? Medication side effects 11/09/2014   ? Heart palpitations 09/19/2014   ? Longstanding persistent atrial fibrillation (HCC)  03/17/2013     03/26/2018 - ECHO:  Moderate concentric LVH seen. The EF is about 55%.  The atria are slightly dilated.  The RV function is mildly decreased.  Mild to moderate MR and TR seen.  Mild AI is present.  Based on the gradients and appearance, the AS appears to be mild. The mean gradient is 16 mm Hg with a peak velocity of 2.6 m/s.  03/26/2018 - Zio Patch:  Predominant rhythm was normal sinus.  Two runs of ventricular tachycardia occurred, they were monomorphic, the longest run lasted 3 seconds, this episode occurred on April 08, 2018 at 9:41 PM.  One episode of AIVR also appeared to be present, it lasted approximately 4.6 seconds  Brief and rare episodes of SVT that probably represent atrial fibrillation, the longest lasting 6.3 seconds.  No evidence of atrial flutter and no evidence of high degree AV block.  05/12/2018 - LAAA:  Persistent Atrial Fibrillation status post successful pulmonary vein antral isolation.  Cavotricuspid Isthmus Ablation.  Ablation of Fractionated Intracardiac Electrograms.  Ablation for an Additional Discrete Arrhythmia-LA AFL after AFIB/PVI Ablation  06/23/2018 - Cardioversion:  Successful direct current cardioversion from symptomatic atrial fibrillation to sinus rhythm.  03/16/2019 - DCCV:  Successful direct current cardioversion from atrial fibrillation to sinus rhythm.     ? Systolic murmur of aorta 02/04/2013   ? SOB (shortness of breath) 02/04/2013   ? Carotid bruit present 01/23/2012   ? Overweight (BMI 25.0-29.9) 01/30/2011   ? Essential hypertension 01/30/2009   ? Hyperlipidemia 01/30/2009     Hyperlipidemia, on statin therapy     ? Skin cancer 01/30/2009   ? Seasonal allergic reaction 01/30/2009         Review of Systems   Constitutional: Negative.   HENT: Negative.    Eyes: Negative.    Cardiovascular: Negative.    Respiratory: Negative.    Endocrine: Negative.    Hematologic/Lymphatic: Negative.    Skin: Negative.    Musculoskeletal: Negative.    Gastrointestinal: Positive for constipation.   Genitourinary: Negative.    Neurological: Negative.    Psychiatric/Behavioral: Negative.    Allergic/Immunologic: Negative.        Physical Exam Limited due to telehealth  General Appearance: In NAD.   Orientation: oriented to person, place and time   Affect & Mood: appropriate and sustained affect   Language and Memory: patient responsive and seems to comprehend information   Neurologic Exam: neurological assessment grossly intact   Vital signs were reviewed  Packing in right ear    Cardiovascular Studies:    ? Echo 12/07/2020 the left ventricular systolic function is normal. The visually estimated ejection fraction is 55%.  ? The RV is moderately dilated with reduced systolic function. Pacemaker lead present in the ventricle.  ? Moderate biatrial dilatation.  ? Mitral annular calcification, no stenosis, severe mitral valve regurgitation.  ? Severe tricuspid valve regurgitation.  ? There is a well seated, normal functioning TAVR #26 Sapien S3 Ultra. Normal MG~11 mmHg,no paravalvular/transvalvular regurgitation.  ? Pulmonary hypertension, estimated PAP 46 mmHg.   Compared to the previous study dated 10/25/2020: The tricuspid valve regurgitation has progressed to severe, the pulmonary artery pressure previously was 32 mmHg.    CHEST: 09/28/2020  1. Thickening and calcification of the aortic valve with a normal caliber   ascending thoracic aorta.   2. Development of several sub-5 mm nodules scattered throughout both   lungs. These are nonspecific though are most likely infectious or   inflammatory.  In a low-risk patient no follow-up needed. In a high-risk   patient follow-up chest CT at 12 months is suggested. ?   3. Cardiomegaly and coronary artery disease   4. Pulmonary hypertension   ABDOMEN AND PELVIS:   1. Normal caliber abdominal aorta and common iliac arteries with moderate   diffuse atherosclerosis.   2. Moderate to severe focal stenosis at the origin of the SMA.   3. Moderate distal colonic diverticulosis. ?     6 Minute Walk Last Results 02/13/2021 06/26/2020 08/16/2019   SpO2:  Pre-Activity 96 99 99   Pulse:  POST-Activity 89 94 122   BP:  POST-Activity 150/62 126/76 151/76   SpO2:  POST-Activity 97 99 96   Distance Walked in Meters 305 152.5 289.75       Problems Addressed Today  Encounter Diagnoses   Name Primary?   ? Aortic stenosis, mild Yes       Assessment and Plan   Wild-type ATTR amyloidosis:   -He has had a previous technetium pyrophosphate scan on 06/28/2019 that showed a 2-hour heart/contralateral lung ratio of 1.97 with visual semiquantitative grading grade 3, abnormal and highly suggestive of ATTR cardiac amyloidosis.   - He has had kappa 7.45, lambda 4.07 and free light chain ratio 1.83 on 07/25/2019  -He had serum electrophoresis that showed no paraprotein.   -See 6-minute walk noted above  -He has completed a KCC Q score that was 72.92 indicative of fair to good quality of life.    -He is on tafamidis 61 mg daily.    -He had a baseline CPET on 7/19 2021,, his repeat cardiopulmonary exercise test on 11/11 showed moderately reduced aerobic capacity 54% of predicted VO2 maximum association with abnormally early onset of enterobiasis with anaerobic threshold 41% of VO2 max.  Very similar to his previous CPET in 2021.  -He had repeat TC PYP on 06/11/2020, it showed 2-hour heart/contralateral lung ratio reduced to 1.7, visual semiquantitative grading scale remains at 3.     Heart failure with preserved ejection fraction:  He is currently on Bumex 6 mg twice daily and metolazone 5 mg daily.[name]  His CardioMEMS reading today is 29, last 2 days have been 26-27.  Threshold is 20-24,  CardioMEMS in situ: -12/15/19 IMPLANT RHC:?  BP:?126/76 (99)  RA:?27  RV:?60/32  PA:?57/31 (42)  PCWP:?30  TPG:?12  PVR:?3.8  SVR:?1840.  CO (Fick):?3.1  CI (Fick):?1.7?  AVO2 Diff 7.18?  Daily CardioMEMS Readings  ?Goal PA Diastolic:  22  PA Diastolic Thresholds:  20-24    Right heart cath on 11/26/20  showed RA 20, RV 67/20, PA 67/25 with a mean of 39, wedge was 20, cardiac output by thermodilution was 4.4 with index 1.9.      Severe aortic stenosis status post TAVR with Dr. Paris Lore in [place] on 7/27 2022:   - He had CT chest and abdomen with contrast on 7/1 that showed thickening and calcification of the aortic valve with a normal caliber ascending thoracic aorta, several sub-5 mm nodules scattered throughout both lungs, cardiomegaly, coronary artery disease and pulmonary hypertension.    -He had postprocedure echo on 7/28 that showed prosthetic valve was normal, no stenosis or regurgitation.     Severe mitral regurgitation that is noted on most recent echo, in the future he may need a MitraClip.    CKD stage III: His last chemistry on 12/19 showed a BUN of 138, creatinine 3.65.  Follows with Dr. Mercie Eon who will see him next  on 1/5 when he returns for his dermatology appointment.     Atrial fibrillation: He continues to take amiodarone 200 mg p.o.daily, he is on oral anticoagulation with Xarelto 15 mg daily, he denies any upper or lower GI bleeding symptoms.  He has been in atrial fibrillation since at least 2020.       Permanent pacemaker in situ: He underwent permanent pacemaker insertion with Dr. Milas Kocher, most recent device evaluation on 12/2 showed 1 episode of NSVT lasting 10 beats on 11/28.        I have personally documented the HPI, exam and medical decision making.  Patient education: I reviewed recent lab results and current medications, medication instructions, discussed heart failure signs & symptoms,  low  sodium diet, fluid restriction and daily weights.I have instructed the patient on the plan of care and they verbalize understanding of the plan. Please see AVS for full patient teaching. Patient advised to call our office if s/he has any problems, questions, worsening symptoms, or concerns prior to the next appointment.   Thanks for allowing me to see this nice patient. If I can be of additional assistance, please don't hesitate to contact me.     [name] AGPCNP-BC, [facility]  Pager [phone]  Collaborating physician: Dr. Vanetta Shawl      Follow up: February 22 with Dr. [name].                    Current Medications (including today's revisions)  ? acetaminophen (TYLENOL) 325 mg tablet Take two tablets by mouth every 6 hours as needed.   ? allopurinoL (ZYLOPRIM) 300 mg tablet Take 300 mg by mouth daily. Take with food.   ? amiodarone (PACERONE) 400 mg tablet Take one tablet by mouth daily.   ? bumetanide (BUMEX) 2 mg tablet Take three tablets by mouth twice daily.   ? cholecalciferol (vitamin D3) (VITAMIN D3) 1,000 units tablet Take 1,000 Units by mouth daily.   ? docusate (COLACE) 100 mg capsule Take 100 mg by mouth twice daily.   ? doxycycline hyclate (VIBRAMYCIN) 100 mg capsule Take one capsule by mouth twice daily for 7 days. Take with food and water. Do not take within 2 hours of calcium supplements or dairy products.   ? empagliflozin (JARDIANCE) 10 mg tablet Take one tablet by mouth daily.   ? ferrous sulfate (FEOSOL) 325 mg (65 mg iron) tablet Take 325 mg by mouth twice daily. Take on an empty stomach at least 1 hour before or 2 hours after food.    ? fluorouraciL (EFUDEX) 5 % topical cream Apply to rough spots on hands and arms twice daily for 4 weeks   ? fluticasone/salmeterol (ADVAIR) 100/50 mcg inhalation disk Inhale 1 puff by mouth into the lungs daily.   ? lactobacillus rhamnosus (GG) (CULTURELLE) 10 billion cell cap Take 1 capsule by mouth daily with breakfast.   ? magnesium oxide (MAG-OX) 400 mg (241.3 mg magnesium) tablet Take one tablet by mouth daily.   ? metOLazone (ZAROXOLYN) 5 mg tablet Take one tablet by mouth daily. 03/12/2021 Take Metolazone 5 mg 30-60 minutes PRIOR to Bumex.   ? omeprazole DR (PRILOSEC) 20 mg capsule Take one capsule by mouth daily before breakfast.   ? polyethylene glycol 3350 (MIRALAX) 17 g packet Take one packet by mouth as Needed.   ? potassium chloride SR (K-DUR) 10 mEq tablet Take six tablets by mouth three times daily.   ? rivaroxaban (XARELTO) 15 mg tablet Take one tablet by  mouth daily with breakfast. Take with food.   ? rosuvastatin (CRESTOR) 5 mg tablet Take one tablet by mouth three times weekly.   ? senna (SENOKOT) 8.6 mg tablet Take 2 tablets by mouth twice daily as needed for Constipation.   ? sildenafiL (VIAGRA) 100 mg tablet TAKE 1 TABLET BY MOUTH ONCE DAILY AS NEEDED FOR SEXUAL ACTIVITY   ? tafamidis (VYNDAMAX) 61 mg capsule Take one capsule by mouth daily.   ? VENTOLIN HFA 90 mcg/actuation inhaler Inhale 1 puff by mouth into the lungs as Needed.

## 2021-04-02 NOTE — Telephone Encounter
Pt's PAD's remain above his thresholds. PC to pt for symptom check and to offer OV today (per provider, either Telehealth or inperson at 3 PM). LVM asking for return call.       DAILY CardioMEMS READINGS    Goal PA Diastolic: 21-22 mmHg  PA Diastolic Thresholds: 20-24 mmHg            CARDIAC MEDS:    Home Medications    Medication Sig   amiodarone (PACERONE) 400 mg tablet Take one tablet by mouth daily.   bumetanide (BUMEX) 2 mg tablet Take three tablets by mouth twice daily.   cholecalciferol (vitamin D3) (VITAMIN D3) 1,000 units tablet Take 1,000 Units by mouth daily.   empagliflozin (JARDIANCE) 10 mg tablet Take one tablet by mouth daily.   magnesium oxide (MAG-OX) 400 mg (241.3 mg magnesium) tablet Take one tablet by mouth daily.   metOLazone (ZAROXOLYN) 5 mg tablet Take one tablet by mouth daily. 03/12/2021 Take Metolazone 5 mg 30-60 minutes PRIOR to Bumex.   potassium chloride SR (K-DUR) 10 mEq tablet Take six tablets by mouth three times daily.   rivaroxaban (XARELTO) 15 mg tablet Take one tablet by mouth daily with breakfast. Take with food.   rosuvastatin (CRESTOR) 5 mg tablet Take one tablet by mouth three times weekly.   tafamidis (VYNDAMAX) 61 mg capsule Take one capsule by mouth daily.       RECENT LABS:    Basic Metabolic Profile    Lab Results   Component Value Date/Time    NA 135 (L) 03/18/2021 12:00 AM    K 3.6 03/18/2021 12:00 AM    CA 9.3 03/18/2021 12:00 AM    CL 91 (L) 03/18/2021 12:00 AM    CO2 29 03/18/2021 12:00 AM    GAP 15 03/18/2021 12:00 AM    Lab Results   Component Value Date/Time    BUN 130 (H) 03/18/2021 12:00 AM    CR 3.65 (H) 03/18/2021 12:00 AM    GLU 129 (H) 03/18/2021 12:00 AM          UPCOMING APPOINTMENTS:    Future Appointments   Date Time Provider Department Center   04/04/2021 11:00 AM IM DERM NURSE [facility] IM   04/16/2021  9:00 AM Robyne Askew, MD [facility]AP[facility]M IM   05/22/2021  2:30 PM Vanetta Shawl I, MD CVMSNCL CVM Exam   05/31/2021  9:30 AM [place] [facility] RO[facility] 3 [place] CVM Procedur   06/26/2021  3:00 PM [name], MD QVNEPHRO IM

## 2021-04-02 NOTE — Patient Instructions
Thank you for coming to [facility].     Treatment Plan:    1.  Recommendations: No changes in meds today      2.  Next follow up appointment with Dr. Herma Carson.  [name] on 2/22.        In order to provide you the best care possible we ask that you follow up as outlined below:     For NON-URGENT questions please contact us through your MyChart account.   For all medication refills please contact your pharmacy or send a request through MyChart.     For all questions that may need to be addressed urgently please call the nursing triage line at [phone] Monday - Friday 8 AM - 4 PM only. Please leave a detailed message with your name, date of birth, and reason for your call.  If calling outside business hours please call our on-call provider at [phone]    To schedule an appointment or to make changes to your appointments please call [phone].     Please allow 10 business days for the results of any testing to be reviewed. Please call our office if you have not heard from a nurse within this time frame.    At the [facility], you and your family are our top priority.  You may receive a survey via email or text message that we are asking you to complete.  We value your feedback to ensure you are satisfied with every visit and we are continually providing the highest quality of patient care.  We know your time is valuable and thank you in advance for completing the survey.    Vanetta Shawl, MD  [name] AGPCNP-BC, [facility]      Team [place] Nurses:   Elayne Snare, Magdalen Spatz, Rogelia Rohrer Rene Paci [facility] at [facility]  Nurse Triage Line: [phone] Fax: [phone] Scheduling: [phone] On-Call: [phone]      Heart Failure Zones-TUKH  Your heart failure symptom awareness and action plan.  Every Day Action Plan  Weigh yourself in the morning before breakfast. Write it down and compare it to yesterday's weight  Take your medicine, as prescribed.  Check for worsened swelling in your feet, ankles and stomach  Follow a 2000mg  salt diet.  Keep all healthcare appointments   GREEN ZONE:    Green Zone - Good! Symptoms are under control.  If all these statements are true, your symptoms are under control.  No shortness of breath   No increase in ankle swelling  No weight gain  No chest pain  No change in your usual activity  Continue to follow your everyday action plan.   YELLOW ZONE:    Yellow Zone - Call your doctor.  If you notice any of the following symptoms, call your healthcare provider.  Increased shortness of breath with activity  Weight gain of 3 pounds in one day or 5 pounds in a week  Increased swelling in your ankles or legs  Increased swelling in your stomach  Increasing fatigue  You may need an adjustment of your medications.   RED ZONE:     Red Zone - Call your doctor. Make an appointment.  If you have any of the following, call [facility] at [phone] or your cardiologist.  Shortness of breath at rest or waking up at night feeling short of breath or coughing  Increased number of  pillows used or needing to sit upright to sleep  Chest tightness at rest  Dizziness, lightheadedness or feeling faint You need to schedule an appointment  EMERGENCY ZONE:     Emergency Zone - Call 911 Now  Call 9-1-1 immediately if you have any of the following:  Worsening chest tightness or pain that is not relieved by medication  Severe shortness of breath and a cough with pink, frothy sputum        Daily Weight          For up to date information on the COVID-19 virus, visit the [facility] website.  General supportive care during cold and flu season and infection prevention reminders:   Wash hands often with soap and water for at least 20 seconds  Cover your mouth and nose  Stay home if sick and symptoms mild or manageable  If you must be around people wear a mask    If you are having symptoms of a lower respiratory infection (cough, shortness of breath) and/or fever AND either traveled in last 30 days (internationally or to region of exposure) OR known exposure to patient with COVID19:  Call your primary care provider for questions or health needs.   Tell your doctor about your recent travel and your symptoms  In a medical emergency, call 911 or go to the nearest emergency room.

## 2021-04-04 ENCOUNTER — Encounter: Admit: 2021-04-04 | Discharge: 2021-04-04 | Payer: MEDICARE

## 2021-04-04 ENCOUNTER — Ambulatory Visit: Admit: 2021-04-04 | Discharge: 2021-04-04 | Payer: MEDICARE

## 2021-04-04 DIAGNOSIS — I10 Essential (primary) hypertension: Principal | ICD-10-CM

## 2021-04-04 DIAGNOSIS — N184 Chronic kidney disease, stage 4 (severe): Secondary | ICD-10-CM

## 2021-04-04 DIAGNOSIS — I340 Nonrheumatic mitral (valve) insufficiency: Secondary | ICD-10-CM

## 2021-04-04 DIAGNOSIS — I358 Other nonrheumatic aortic valve disorders: Secondary | ICD-10-CM

## 2021-04-04 DIAGNOSIS — Z8679 Personal history of other diseases of the circulatory system: Secondary | ICD-10-CM

## 2021-04-04 DIAGNOSIS — E859 Amyloidosis, unspecified: Secondary | ICD-10-CM

## 2021-04-04 DIAGNOSIS — Z952 Presence of prosthetic heart valve: Secondary | ICD-10-CM

## 2021-04-04 DIAGNOSIS — J45909 Unspecified asthma, uncomplicated: Secondary | ICD-10-CM

## 2021-04-04 DIAGNOSIS — E785 Hyperlipidemia, unspecified: Secondary | ICD-10-CM

## 2021-04-04 DIAGNOSIS — I361 Nonrheumatic tricuspid (valve) insufficiency: Secondary | ICD-10-CM

## 2021-04-04 DIAGNOSIS — C4490 Unspecified malignant neoplasm of skin, unspecified: Secondary | ICD-10-CM

## 2021-04-04 DIAGNOSIS — Z959 Presence of cardiac and vascular implant and graft, unspecified: Secondary | ICD-10-CM

## 2021-04-04 DIAGNOSIS — J302 Other seasonal allergic rhinitis: Secondary | ICD-10-CM

## 2021-04-04 DIAGNOSIS — N189 Chronic kidney disease, unspecified: Secondary | ICD-10-CM

## 2021-04-04 DIAGNOSIS — E8582 Wild-type transthyretin-related (ATTR) amyloidosis: Secondary | ICD-10-CM

## 2021-04-04 DIAGNOSIS — I350 Nonrheumatic aortic (valve) stenosis: Secondary | ICD-10-CM

## 2021-04-04 LAB — PROTEIN/CR RATIO,UR RAN
PROT CREAT RAT/CAL: 0.5 — ABNORMAL HIGH (ref <0.15)
UR CREATININE, RAN: 17 mg/dL
UR TOTAL PROTEIN,RAN: 8 mg/dL

## 2021-04-04 NOTE — Patient Instructions
Please do not hesitate to call with any questions.  My nurse Jarrett Soho can be reached [phone].   Here are some resources for you to go and learn about dialysis:   [email or web address]  [email or web address]  [email or web address]  [email or web address]       Return to clinic in 2 months with Pierce Crane, APRN and 4 months with me

## 2021-04-05 ENCOUNTER — Encounter: Admit: 2021-04-05 | Discharge: 2021-04-05 | Payer: MEDICARE

## 2021-04-05 DIAGNOSIS — C4441 Basal cell carcinoma of skin of scalp and neck: Secondary | ICD-10-CM

## 2021-04-06 ENCOUNTER — Encounter: Admit: 2021-04-06 | Discharge: 2021-04-06 | Payer: MEDICARE

## 2021-04-09 ENCOUNTER — Encounter: Admit: 2021-04-09 | Discharge: 2021-04-09 | Payer: MEDICARE

## 2021-04-16 ENCOUNTER — Encounter: Admit: 2021-04-16 | Discharge: 2021-04-16 | Payer: MEDICARE

## 2021-04-16 ENCOUNTER — Ambulatory Visit: Admit: 2021-04-16 | Discharge: 2021-04-16 | Payer: MEDICARE

## 2021-04-16 DIAGNOSIS — L905 Scar conditions and fibrosis of skin: Secondary | ICD-10-CM

## 2021-04-16 DIAGNOSIS — T1490XD Injury, unspecified, subsequent encounter: Secondary | ICD-10-CM

## 2021-04-16 NOTE — Progress Notes
DERMATOLOGIC SURGERY F/U NOTE    CC: Scar evaluation    HPI: Ray [name] is a 86 y.o. male who presents for follow-up of scar s/p Mohs surgery with FTSG repair on the right ear. Pt reports healing well at this time.    ROS: Patient denies any other skin complaints    PE:   Constitutional: Patient is well appearing in no acute distress.  Mood/affect are pleasant and appropriate. Alert and oriented to person, place, time.   Right ear - well-healing graft site with focal areas of crusting; excellent overall take of graft; Right upper arm - well healing surgical site with 2 focal areas of superficial ulceration/crusting    A/P:    # Scars s/p Mohs micrographic surgery  - Well-healing scars overall. Recommend continued vaseline x 3 weeks to graft site; continue vaseline to the arm for 1-2 more weeks until healed.  - Reviewed natural course of wound healing  - Reviewed scar management.  - Patient informed to call the office if any issues were to arise in the future. Sunscreen/ sun avoidance reinforced.   - Patient instructed to continue to follow-up with primary dermatologist for routine skin screenings.    RTC as scheduled for next Mohs procedure

## 2021-04-29 ENCOUNTER — Encounter: Admit: 2021-04-29 | Discharge: 2021-04-29 | Payer: MEDICARE

## 2021-04-30 MED FILL — VYNDAMAX 61 MG PO CAP: 61 mg | ORAL | 30 days supply | Qty: 30 | Fill #9 | Status: AC

## 2021-05-02 ENCOUNTER — Ambulatory Visit: Admit: 2021-05-02 | Discharge: 2021-05-02 | Payer: MEDICARE

## 2021-05-02 DIAGNOSIS — I5032 Chronic diastolic (congestive) heart failure: Secondary | ICD-10-CM

## 2021-05-06 ENCOUNTER — Encounter: Admit: 2021-05-06 | Discharge: 2021-05-06 | Payer: MEDICARE

## 2021-05-06 NOTE — Telephone Encounter
MOHS SURGERY PRE-OP PHONE CONSULTATION:    Preoperative phone consultation completed for upcoming Mohs surgery with Dr. Arlana Pouch on 05/14/2021.    I called the patient and reviewed with the patient what Mohs surgery is and what the day of the procedure/days following the procedure will look like. Informed the patient that he/she may drive to and from the procedure and can eat breakfast prior. Instructed to bring book/magazine or something to work on as procedure can take several hours. Okay to bring family member or friend. I answered all patient questions and concerns. Left patient with direct nurse line for patient to call back if any future questions or concerns arise.    Diagnosis: Basal cell carcinoma (superficial)  Site: Right neck     Coordinated closure: No  If coordinated closure needed: specialty, physician, and date of closure: N/A  History of Mohs previously: Yes  Bleeding disorder: No  Use of blood thinners: Yes  Pacemaker/Defibrillator: Yes - Pacemaker  History of joint replacement in the last 2 years and/or heart valve replacement: Yes - TAVR in 2022  Are all allergies on file: Yes  Are all medications on file: Yes  Does patient live in SNF: No  Is the patient able to sign consent for him/herself: Yes  Accommodations needed: No  If accommodations needed, type of accommodations: N/A  Additional notes: None    CBC w diff    Lab Results   Component Value Date/Time    WBC 7.7 12/25/2020 09:17 AM    RBC 2.87 (L) 12/25/2020 09:17 AM    HGB 9.0 (L) 12/25/2020 09:17 AM    HCT 27.9 (L) 12/25/2020 09:17 AM    MCV 97.2 12/25/2020 09:17 AM    MCH 31.4 12/25/2020 09:17 AM    MCHC 32.3 12/25/2020 09:17 AM    RDW 17.3 (H) 12/25/2020 09:17 AM    PLTCT 192 12/25/2020 09:17 AM    MPV 8.7 12/25/2020 09:17 AM    Lab Results   Component Value Date/Time    NEUT 75 11/30/2020 07:43 AM    ANC 4.82 11/30/2020 07:43 AM    LYMA 11 (L) 11/30/2020 07:43 AM    ALC 0.69 (L) 11/30/2020 07:43 AM    MONA 11 11/30/2020 07:43 AM    AMC 0.71 11/30/2020 07:43 AM    EOSA 2 11/30/2020 07:43 AM    AEC 0.12 11/30/2020 07:43 AM    BASA 1 11/30/2020 07:43 AM    ABC 0.06 11/30/2020 07:43 AM          Theodis Blaze, RN

## 2021-05-13 ENCOUNTER — Encounter: Admit: 2021-05-13 | Discharge: 2021-05-13 | Payer: MEDICARE

## 2021-05-13 NOTE — Telephone Encounter
LVM for pt or spouse to call back if pt having increased weight, SOA, DOE or Edema since PAD's above his thresholds today. Pt scheduled for MOHS procedure tomorrow per Epic chart.       DAILY CardioMEMS READINGS    Goal PA Diastolic: 22 mmHg  PA Diastolic Thresholds: 20-24 mmHg        CARDIAC MEDS:    Home Medications    Medication Sig   amiodarone (PACERONE) 400 mg tablet Take one tablet by mouth daily.   bumetanide (BUMEX) 2 mg tablet Take three tablets by mouth twice daily.   empagliflozin (JARDIANCE) 10 mg tablet Take one tablet by mouth daily.   magnesium oxide (MAG-OX) 400 mg (241.3 mg magnesium) tablet Take one tablet by mouth daily.   metOLazone (ZAROXOLYN) 5 mg tablet Take one tablet by mouth daily.   potassium chloride SR (K-DUR) 10 mEq tablet Take six tablets by mouth three times daily.   rivaroxaban (XARELTO) 15 mg tablet Take one tablet by mouth daily with breakfast. Take with food.   rosuvastatin (CRESTOR) 5 mg tablet Take one tablet by mouth three times weekly.   sildenafiL (VIAGRA) 100 mg tablet TAKE 1 TABLET BY MOUTH ONCE DAILY AS NEEDED FOR SEXUAL ACTIVITY   tafamidis (VYNDAMAX) 61 mg capsule Take one capsule by mouth daily.       RECENT LABS:    Basic Metabolic Profile    Lab Results   Component Value Date/Time    NA 135 (L) 03/18/2021 12:00 AM    K 3.6 03/18/2021 12:00 AM    CA 9.3 03/18/2021 12:00 AM    CL 91 (L) 03/18/2021 12:00 AM    CO2 29 03/18/2021 12:00 AM    GAP 15 03/18/2021 12:00 AM    Lab Results   Component Value Date/Time    BUN 130 (H) 03/18/2021 12:00 AM    CR 3.65 (H) 03/18/2021 12:00 AM    GLU 129 (H) 03/18/2021 12:00 AM          UPCOMING APPOINTMENTS:    Future Appointments   Date Time Provider Department Center   05/14/2021  7:45 AM Robyne Askew, MD [facility] IM   05/22/2021  2:30 PM Vanetta Shawl I, MD [facility] CVM Exam   05/31/2021  9:30 AM [place] [facility] 3 [facility] C[facility] Procedur   06/06/2021 10:30 AM Deedra Ehrich, APRN-NP [facility] IM   09/24/2021  3:20 PM [name], MD [facility] IM

## 2021-05-14 ENCOUNTER — Encounter: Admit: 2021-05-14 | Discharge: 2021-05-14 | Payer: MEDICARE

## 2021-05-14 ENCOUNTER — Ambulatory Visit: Admit: 2021-05-14 | Discharge: 2021-05-14 | Payer: MEDICARE

## 2021-05-14 DIAGNOSIS — C4441 Basal cell carcinoma of skin of scalp and neck: Secondary | ICD-10-CM

## 2021-05-14 NOTE — Procedures
Mohs Micrographic Surgery Operative Note    Patient Name: Ray [name]  Date of Birth: 23-Oct-1935    Procedure: Mohs micrographic surgery  Surgeon and Pathologist: Kearney Hard, MD    Referring MD: Robyne Askew  Date of Service: 05/14/2021    A detailed discussion of the diagnosis, prognosis, and treatment options including Mohs micrographic surgery, wide local excision, radiation, destruction, and chemotherapy was performed. Based on my medical judgment, Mohs surgery is medically necessary because it is the most appropriate treatment for this cancer compared to other treatments. The rationale for Mohs surgery was explained to the patient and patient opted for Mohs surgery. The risks, benefits, and alternatives to treatment were discussed in detail. Specifically, the risks of pain, infection, swelling, bleeding, bruising, scarring, altered appearance, prolonged wound healing, incomplete removal, allergy to anesthesia, nerve injury, functional impairment, and recurrence discussed. Patient also informed that the skin cancer removal will result in a defect in the skin and soft tissue that may require repair such as a primary closure, flap, graft, or closure in the operating room if needed. If the surgery reveals advanced disease, further resection in the operating room or adjuvant treatment with radiation and/or chemotherapy may be recommended.  All questions answered. Written and verbal informed consent obtained.    Prior to the procedure, a time-out was performed in which the patient's identity was verified and treatment site(s) were clearly identified and cleaned with alcohol prep pads, marked with surgical marker, and confirmed by the patient and surgeons.  All components of time-out protocol completed. Kearney Hard, MD operated in two distinct and integrated capacities as the surgeon and pathologist.      Lesion A  Mohs Case Number:  Site:  Pre Op Dx:  Post Op Dx:  Tumor Recurrence Status:  PreOp SIze:  PostOp Size:  Number of Stages:  Repair Type(s):  Final Wound Length (cm)/ Area of Flap or Graft (cm2):  Anesthesia: local infiltration  Skin Prep:   [id number]  Right neck  Basal cell carcinoma (nodular)  Basal cell carcinoma (nodular)  Primary  6.0 x 2.2 cm  6.5 x 4.0 cm  2  Advancement flap  5.5 x 6.5 = 35.8 cm2  Lidocaine 1.0% soln w epinephrine    Chlorhexidine        Total Anesthesia per stage and/or repair (mL): 12/ 6 / 18 /  /  /  /  /  /  /  /     Estimated Blood loss:  Minimal  Complications:  None    Indications for Mohs Micrographic Surgery:    The patient has a biopsy- proven Primary Basal cell carcinoma (nodular)  located on the right neck.    Removal of the patient's tumor is complicated by the following clinical features:    Close proximity to embryonic planes, Located in area of high recurrence, Ill defined margins, Involvement of sensitive cosmetic/functional structure, Suspected deep tissue invasion of tumor requiring margin evaluation and Large size     Stage 1:      The patient was reclined on the surgery table. The site was prepped with antiseptic, infiltrated with local anesthesia, and draped. The surgical field was again prepped with antiseptic. All visible tumor was completely debulked using a curette and/or scalpel. An excision was made 2-36mm around the debulking defect following standard Mohs technique. Hemostasis was achieved with spot electrocautery. Pressure dressing was applied. Tissue was carefully divided into number specimen(s) indicated below, which was oriented, color coded using ink,  and mapped. The specimen was then given to the technician for frozen sectioning, mounting, and staining using the Mohs protocol. Frozen section analysis showed residual tumor in number of specimens as indicated below.   Number of Specimen(s):  Number of positive specimen(s): 4  1 Nodular basal cell carcinoma in scar in the subcutis as indicated in red on the attached Mohs map.       Stage 2:    The patient was prepped and draped in same fashion as the first stage. Using the Mohs technique, a thin layer of tissue was removed around all areas where tumor was visible on the previous stage. Hemostasis was achieved with spot electrocautery. Pressure dressing was applied. Tissue was carefully divided into number of specimen(s) indicated below, which was oriented, color coded using ink, mapped, and processed as above using the Mohs protocol. Frozen section analysis showed residual tumor in number of specimens indicated below.  Number of Specimen(s):  Number of positive specimen(s): 1  0 Microscopic examination of tissue revealed margins clear of tumor.     Repair Note:  Advancement flap    Repair Type:  Advancement flap  Dermal and subcutaneous suture:  4-0 vicryl, 4-0 monocryl  Epidermal suture:  5-0 fast gut  Estimated blood loss:  < 5mL  Complications:  None.  Patient was discharged in stable condition.    Indications:  This patient was left with a skin and soft tissue defect following Mohs surgery.  A variety of closure modalities were discussed with the patient and it was decided that an advancement flap would best preserve normal anatomical and functional relationships given defect size, location, and proximity to free margins.  Flap repair was performed to avoid anatomic distortion and/or functional deficit in adjacent fixed structures and anatomic free margins, because damaged skin surrounding the defect made closure difficult, because inelasticity of skin made closure difficult, to reduce risk of hematoma, to reduce tension to reduce risk of skin necrosis, infection, and wound dehiscence, and to reduce tension to enhance both cosmetic and functional results.  After discussing the risks including pain, bleeding, bruising, swelling, infection, scarring, altered appearance, contour deformity, necrosis, wound dehiscence, functional deficit, nerve damage and need for revision, informed consent was obtained and the patient underwent the procedure as follows:    Procedure:  The patient was taken to the operative suite and placed on the surgery table.  An advancement flap was outlined with a surgical marker following natural skin tension lines where possible.  The surgical defect and surrounding skin were cleansed with antiseptic, infiltrated with local anesthesia, re-cleansed with antiseptic, and then prepped with sterile drapes.  The defect edges were debeveled with a #15 blade and iris scissors.  A #15 blade was used to make flap incisions to the adipose tissue.  Flap was elevated and undermined to the appropriate extent using #15 blade and iris scissors, taking care to avoid damage to the local motor nerves.  The flap was appropriately thinned to match the defect thickness while ensuring appropriate vascular supply for flap viability.   Hemostasis achieved with electrocautery.  Flap was advanced into the primary defect and secured with buried vertical mattress sutures.  Appropriate back cut and redundant tissue cones excised.  Epidermis was closed with simple running and interrupted sutures. The patient had a nice final flap with pink healthy appearance.  White petrolatum and pressure dressing applied.  Verbal and written wound care instructions were given.  RTC for suture removal and/or post-op check as directed. The  patient was encouraged to follow up with the referring dermatologist.    Post-operative Plan:  - Written and verbal wound care instructions reviewed  - Advised long-term follow-up with referring dermatologist for skin cancer surveillance  - Sun protection reviewed  - Scar management reviewed      RTC as directed for suture removal and/or wound check

## 2021-05-15 ENCOUNTER — Encounter: Admit: 2021-05-15 | Discharge: 2021-05-15 | Payer: MEDICARE

## 2021-05-15 NOTE — Progress Notes
MOHS CONSULT NOTE    Dermatologic Surgery Consult Note:    Subjective:       Chief Complaint: skin cancer    History of Present Illness  Ray [name] is a 86 y.o. male who presents for consultation for treatment of skin cancer of unknown duration.          Referring provider: Robyne Askew  Site:right neck  Diagnosis: Basal cell carcinoma (nodular)    Reviewed Epic   Medical History:   Diagnosis Date   ? Amyloidosis (HCC)     wild type   ? 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 Asthma    ? Chronic kidney disease    ? Essential hypertension 01/30/2009   ? History of atrial fibrillation    ? Hyperlipidemia 01/30/2009   ? Hypertension 01/30/2009   ? Nonrheumatic aortic valve stenosis 09/06/2020   ? Nonrheumatic mitral valve regurgitation 09/06/2020   ? Nonrheumatic tricuspid valve regurgitation 09/06/2020   ? Seasonal allergic reaction 01/30/2009   ? Skin cancer 01/30/2009     Surgical History:   Procedure Laterality Date   ? INTRACARDIAC CATHETER ABLATION WITH COMPREHENSIVE ELECTROPHYSIOLOGIC EVALUATION - ATRIAL FIBRILLATION N/A 05/12/2018    Performed by Jen Mow, MD at [facility] EP LAB   ? ATRIAL ABLATION SURGERY  2021   ? CATHETERIZATION RIGHT HEART WITH INSERTION PULMONARY ARTERY SENSOR N/A 12/15/2019    Performed by Harley Alto, MD at [facility] CATH LAB   ? ANGIOGRAPHY CORONARY ARTERY WITH LEFT HEART CATHETERIZATION N/A 09/17/2020    Performed by Harley Alto, MD at [facility] CATH LAB   ? POSSIBLE PERCUTANEOUS CORONARY STENT PLACEMENT WITH ANGIOPLASTY N/A 09/17/2020    Performed by Harley Alto, MD at [facility] CATH LAB   ? Transcatheter Aortic Valve Replacement - Femoral Artery N/A 10/24/2020    Performed by Arby Barrette, MD at [facility] CVOR   ? CATHETERIZATION RIGHT HEART N/A 11/26/2020    Performed by Hajj, Alfredo Martinez, MD at [facility] CATH LAB   ? ESOPHAGOGASTRODUODENOSCOPY WITH BIOPSY - FLEXIBLE N/A 11/28/2020    Performed by Samuel Jester, MD at [facility] ENDO   ? GASTROINTESTINAL TRACT IMAGING ESOPHAGUS THROUGH ILEUM - INTRALUMINAL N/A 11/28/2020    Performed by Samuel Jester, MD at [facility] ENDO   ? COLONOSCOPY WITH BIOPSY - FLEXIBLE N/A 11/28/2020    Performed by Samuel Jester, MD at [facility] ENDO   ? INSERTION/ REPLACEMENT PERMANENT PACEMAKER WITH ATRIAL AND VENTRICULAR LEAD Left 11/29/2020    Performed by Donnelly Stager, MD at [facility] EP LAB   ? ACHILLES TENDON SURGERY Right    ? CARDIAC CATHERIZATION  6/202022   ? CARPAL TUNNEL RELEASE Bilateral    ? HX APPENDECTOMY     ? LUMBAR SPINE SURGERY     ? MOHS SURGERY     ? ROTATOR CUFF REPAIR Right      Family History   Problem Relation Age of Onset   ? Stroke Father      Social History     Socioeconomic History   ? Marital status: Married   Tobacco Use   ? Smoking status: Never   ? Smokeless tobacco: Never   Vaping Use   ? Vaping Use: Never used   Substance and Sexual Activity   ? Alcohol use: Yes     Alcohol/week: 2.0 standard drinks     Types: 1 Glasses of wine, 1 Cans of beer per week     Comment: occasional vodka tonic/evening   ?  Drug use: No     Vaping/E-liquid Use   ? Vaping Use Never User                    Objective:         ? acetaminophen (TYLENOL) 325 mg tablet Take two tablets by mouth every 6 hours as needed.   ? allopurinoL (ZYLOPRIM) 300 mg tablet Take 300 mg by mouth daily. Take with food.   ? amiodarone (PACERONE) 400 mg tablet Take one tablet by mouth daily.   ? bumetanide (BUMEX) 2 mg tablet Take three tablets by mouth twice daily.   ? cholecalciferol (vitamin D3) (VITAMIN D3) 1,000 units tablet Take 1,000 Units by mouth daily.   ? docusate (COLACE) 100 mg capsule Take 100 mg by mouth twice daily.   ? empagliflozin (JARDIANCE) 10 mg tablet Take one tablet by mouth daily.   ? ferrous sulfate (FEOSOL) 325 mg (65 mg iron) tablet Take 325 mg by mouth twice daily. Take on an empty stomach at least 1 hour before or 2 hours after food.    ? fluorouraciL (EFUDEX) 5 % topical cream Apply to rough spots on hands and arms twice daily for 4 weeks   ? fluticasone/salmeterol (ADVAIR) 100/50 mcg inhalation disk Inhale 1 puff by mouth into the lungs daily.   ? lactobacillus rhamnosus (GG) (CULTURELLE) 10 billion cell cap Take 1 capsule by mouth daily with breakfast.   ? magnesium oxide (MAG-OX) 400 mg (241.3 mg magnesium) tablet Take one tablet by mouth daily.   ? metOLazone (ZAROXOLYN) 5 mg tablet Take one tablet by mouth daily. 03/12/2021 Take Metolazone 5 mg 30-60 minutes PRIOR to Bumex.   ? omeprazole DR (PRILOSEC) 20 mg capsule Take one capsule by mouth daily before breakfast.   ? polyethylene glycol 3350 (MIRALAX) 17 g packet Take one packet by mouth as Needed.   ? potassium chloride SR (K-DUR) 10 mEq tablet Take six tablets by mouth three times daily.   ? rivaroxaban (XARELTO) 15 mg tablet Take one tablet by mouth daily with breakfast. Take with food.   ? rosuvastatin (CRESTOR) 5 mg tablet Take one tablet by mouth three times weekly.   ? senna (SENOKOT) 8.6 mg tablet Take 2 tablets by mouth twice daily as needed for Constipation.   ? sildenafiL (VIAGRA) 100 mg tablet TAKE 1 TABLET BY MOUTH ONCE DAILY AS NEEDED FOR SEXUAL ACTIVITY   ? tafamidis (VYNDAMAX) 61 mg capsule Take one capsule by mouth daily.   ? VENTOLIN HFA 90 mcg/actuation inhaler Inhale 1 puff by mouth into the lungs as Needed.     Vitals:    05/14/21 0746   BP: 114/52   PainSc: Three   Weight: 74.4 kg (164 lb)   Height: 162.6 cm (5' 4)     Body mass index is 28.15 kg/m?[name]     Physical Exam    WDWN male in NAD.  AAO x 3.   Mood/affect is pleasant and appropriate. Physical examination revealed the following:  - Ill-defined lesion measuring 6.0 x 2.2cm on the right neck    Data Reviewed: pathology report, biopsy photo and provider referral notes         Assessment and Plan:    # Biopsy-proven Basal cell carcinoma (nodular)   - We discussed the malignant nature of the lesion. Treatment options including surgery, destruction, radiation, and topical therapy discussed.  Mohs micrographic surgery and repair was recommended given the histologic and clinical features of the lesion,  the location, the need for complete margin assessment for optimal cure, and need for tissue preservation. The patient consented to have the procedure.   - Complicating Factors:   Anti-coagulation: Due to anticoagulation, discussed increased risk of bleeding, bruising, and swelling which can affect wound healing. Decision was made to continue all anticoagulation. and Decision for Surgery: Due to the nature of the defect after Mohs surgery, a decision more major surgery (flap/graft) was made to restore function and cosmesis.   - Sunscreen/ sun avoidance reinforced. Patient instructed to continue to follow-up with primary dermatologist for routine skin screenings.    RTC as scheduled for follow-up

## 2021-05-17 ENCOUNTER — Encounter: Admit: 2021-05-17 | Discharge: 2021-05-17 | Payer: MEDICARE

## 2021-05-17 DIAGNOSIS — I5033 Acute on chronic diastolic (congestive) heart failure: Secondary | ICD-10-CM

## 2021-05-17 DIAGNOSIS — I2510 Atherosclerotic heart disease of native coronary artery without angina pectoris: Secondary | ICD-10-CM

## 2021-05-17 DIAGNOSIS — E8582 Wild-type transthyretin-related (ATTR) amyloidosis: Secondary | ICD-10-CM

## 2021-05-17 DIAGNOSIS — I5032 Chronic diastolic (congestive) heart failure: Secondary | ICD-10-CM

## 2021-05-17 NOTE — Telephone Encounter
ECHO  WHEN: March 10  TIME:12:30  LOCATION: [facility]    Call to pt to update on labs needed and ECHO, pt and wife verb understanding will go to [facility] for lab work.      BNP/TROP/BMP orders faxed to [facility] at [address] [place], [place] [address] Ph) [phone] Fax) [phone]

## 2021-05-20 ENCOUNTER — Encounter: Admit: 2021-05-20 | Discharge: 2021-05-20 | Payer: MEDICARE

## 2021-05-20 DIAGNOSIS — E8582 Wild-type transthyretin-related (ATTR) amyloidosis: Secondary | ICD-10-CM

## 2021-05-20 DIAGNOSIS — I5032 Chronic diastolic (congestive) heart failure: Secondary | ICD-10-CM

## 2021-05-20 DIAGNOSIS — I2510 Atherosclerotic heart disease of native coronary artery without angina pectoris: Secondary | ICD-10-CM

## 2021-05-20 DIAGNOSIS — I5033 Acute on chronic diastolic (congestive) heart failure: Secondary | ICD-10-CM

## 2021-05-20 LAB — BASIC METABOLIC PANEL
ANION GAP: 17 — ABNORMAL HIGH (ref 8–16)
BLD UREA NITROGEN: 121 — ABNORMAL HIGH (ref 8.4–25.7)
CALCIUM: 9.3 (ref 8.8–10)
CHLORIDE: 91 — ABNORMAL LOW (ref 98–107)
CO2: 27 (ref 23–31)
CREATININE: 3.9 — ABNORMAL HIGH (ref 0.72–1.25)
GFR ESTIMATED: 15 — ABNORMAL LOW (ref >60)
POTASSIUM: 3.5 (ref 3.5–5.1)
SODIUM: 135 — ABNORMAL LOW (ref 136–145)

## 2021-05-20 LAB — BNP (B-TYPE NATRIURETIC PEPTI): BNP: 129 — ABNORMAL HIGH (ref 0–100)

## 2021-05-20 LAB — TROPONIN-I: TROPONIN I: 0.2 — ABNORMAL HIGH (ref 0–0.033)

## 2021-05-22 ENCOUNTER — Encounter: Admit: 2021-05-22 | Discharge: 2021-05-22 | Payer: MEDICARE

## 2021-05-22 ENCOUNTER — Ambulatory Visit: Admit: 2021-05-22 | Discharge: 2021-05-22 | Payer: MEDICARE

## 2021-05-22 DIAGNOSIS — I350 Nonrheumatic aortic (valve) stenosis: Secondary | ICD-10-CM

## 2021-05-22 DIAGNOSIS — I361 Nonrheumatic tricuspid (valve) insufficiency: Secondary | ICD-10-CM

## 2021-05-22 DIAGNOSIS — C4490 Unspecified malignant neoplasm of skin, unspecified: Secondary | ICD-10-CM

## 2021-05-22 DIAGNOSIS — N189 Chronic kidney disease, unspecified: Secondary | ICD-10-CM

## 2021-05-22 DIAGNOSIS — E785 Hyperlipidemia, unspecified: Secondary | ICD-10-CM

## 2021-05-22 DIAGNOSIS — Z8679 Personal history of other diseases of the circulatory system: Secondary | ICD-10-CM

## 2021-05-22 DIAGNOSIS — I340 Nonrheumatic mitral (valve) insufficiency: Secondary | ICD-10-CM

## 2021-05-22 DIAGNOSIS — I10 Essential (primary) hypertension: Principal | ICD-10-CM

## 2021-05-22 DIAGNOSIS — I4729 NSVT (nonsustained ventricular tachycardia): Secondary | ICD-10-CM

## 2021-05-22 DIAGNOSIS — I5032 Chronic diastolic (congestive) heart failure: Secondary | ICD-10-CM

## 2021-05-22 DIAGNOSIS — J302 Other seasonal allergic rhinitis: Secondary | ICD-10-CM

## 2021-05-22 DIAGNOSIS — E8582 Wild-type transthyretin-related (ATTR) amyloidosis: Secondary | ICD-10-CM

## 2021-05-22 DIAGNOSIS — I358 Other nonrheumatic aortic valve disorders: Secondary | ICD-10-CM

## 2021-05-22 DIAGNOSIS — I5033 Acute on chronic diastolic (congestive) heart failure: Secondary | ICD-10-CM

## 2021-05-22 DIAGNOSIS — E859 Amyloidosis, unspecified: Secondary | ICD-10-CM

## 2021-05-22 DIAGNOSIS — J45909 Unspecified asthma, uncomplicated: Secondary | ICD-10-CM

## 2021-05-22 NOTE — Progress Notes
Daily CardioMEMS Readings    Goal PA Diastolic: 22 mmHg   PA Diastolic Thresholds: 01-41 mmHg  Notified [facility]

## 2021-05-22 NOTE — Progress Notes
Date of Service: 05/22/2021    Ray [name] is a 86 y.o. male.       HPI   I had the pleasure of seeing Ray [name], who is accompanied by his wife Ray [name] today for amyloidosis follow-up.     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  ?  I had last seen him in clinic on 11/29, at that office visit he had been doing well.    ?  [name] had a worrisome CardioMEMS reading on 12/31 with a PA diastolic of 34.  He tells me that he does not think the reading was accurate at that day it had prompted him to take it 4-5 times.  Of note he had a skin biopsy on 12/29 that was very difficult, he tells me he was here from 8 AM to 6 PM, they had to repeat biopsy multiple times to get good margins.  He tells me his right arm now has 22 stitches.  Today he reports that he had just got done taking down the Christmas lights, he says his legs look good, he is wearing compression stockings and has no significant edema.  His home weight has been stable between 157 to 158 pounds.  Home blood pressure is 130/60-130 2/62.  He states initially when he was on metolazone 5 mg he would have good diuresis, he feels like it is not as effective as it used to be.  He continues to have some abdominal bloating, denies any constipation.  He denies orthopnea, PND, chest pain or palpitations.    Blood pressure today 128/58 mmHg's and heart rate 68 bpm.  His CardioMEMS numbers mostly have been on target.  Not taken his diuretic dose this morning.    Vitals:    05/22/21 1411   BP: 128/58   BP Source: Arm, Right Upper   Pulse: 68   SpO2: 99%   O2 Device: None (Room air)   PainSc: Zero   Weight: 74.1 kg (163 lb 6.4 oz)   Height: 170.2 cm (5' 7)     Body mass index is 25.59 kg/m?[name]     Past Medical History  Patient Active Problem List    Diagnosis Date Noted   ? Iron deficiency anemia 11/27/2020   ? Heart failure (HCC) 11/26/2020   ? AKI (acute kidney injury) (HCC) 11/26/2020   ? S/P TAVR (transcatheter aortic valve replacement) 10/25/2020   ? Hypokalemia 10/25/2020   ? Mitral regurgitation 09/06/2020   ? Nonrheumatic tricuspid valve regurgitation 09/06/2020   ? Nonrheumatic aortic valve stenosis 09/06/2020   ? Asymptomatic microscopic hematuria 12/25/2019   ? S/P left pulmonary artery pressure sensor implant placement 12/15/2019     *Patient has CardioMEMS (Pulmonary Artery Pressure Sensor)* Please call [name], CardioMEMs Program Coordinator [phone] or page Heart Failure Rounding Team if patient is admitted or presents to ED*          ? Chronic heart failure with preserved ejection fraction (HCC) 12/15/2019   ? Acute on chronic diastolic CHF (congestive heart failure), NYHA class 2 (HCC) 11/14/2019   ? 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  ?  01/26/2019 Echo EF:?60%  IVS 1.50 cm (Range: 0.6 - 1.0)  LV PW 1.40 cm (Range: 0.6 - 1.0)         ?  1. Moderate concentric left ventricular hypertrophy  2. Unable to assess diastolic function due to atrial fibrillation  3. Moderate biatrial dilatation  4. Normal right ventricular size and systolic function  5. Normal central venous pressure  6. Mild calcific aortic valve stenosis. ?Mean gradient is 16 mmHg with a peak velocity of 2.8 m/s. ?Trace aortic valve regurgitation is seen  7. Mitral valve is structurally okay. ?There is moderate central regurgitation. ?No stenosis  8. Moderate tricuspid valve regurgitation  9. Pulmonary artery static pressure measures at 33 mmHg   06/28/2019 TC PYP Multiview planar views and SPECT imaging confirms the presence of severe marked diffuse global left ventricular and right ventricular myocardial uptake of tracer. ?There is normal rib and bone uptake.  ?  Two hour heart/contralateral lung (H/CL) ratio: ?1.977?this metric is abnormal and highly suggestive of ATTR cardiac amyloidosis.?  ?  Visual Semi-Quantitative Grading Scale Analysis:?  The study is grade 3 indicating myocardial uptake greater than rib and bone uptake. ?The pattern is highly and strongly suggestive of ATTR cardiac amyloidosis.  ?  SUMMARY/OPINION:   This study is abnormal and strongly suggestive of ATTR cardiac amyloidosis. ?Left ventricular myocardial uptake is diffuse, intense, and global. ?There is also prominent right ventricular free wall uptake of tracer. ?The pattern is typical for ATTR cardiac amyloidosis   ? Cardiac MRI ?   07/25/2019 Kappa/Lambda FLC ?  ? Ref. Range 07/25/2019 15:48   Kappa, FLC Latest Ref Range: 0.33 - 1.94 MG/DL 1.61 (H)   Lambda, FLC Latest Ref Range: 0.57 - 2.63 MG/DL 0.96 (H)   Kappa/Lambda FLC Latest Ref Range: 0.26 - 1.65  1.83 (H)      07/25/2019 Serum Immunofixation ?NO PARAPROTEIN SEEN   cancelled Urine Immunofixation ?Patient unable to provide urine sample 4/26    07/25/2019 Genetic Testing ?Negative   ? Biopsy (Fat Pad, Cardiac, Bone Marrow) ?   ? GI Symptoms ?   ?+? 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   ?  ? Ref. Range 07/25/2019 15:48   B Type Natriuretic Peptide Latest Ref Range: 0 - 100 PG/ML 966.0 (H)   Troponin-I Latest Ref Range: 0.0 - 0.05 NG/ML 0.05   ?  Completed:  -Tafamidis start 08/2019. PA approved, pt approved for [employer] patient assistance from 09/01/2019 to 03/30/2020  -Baseline KCCQ12 completed 5/18/202, Summary Score 61.46  -Baseline 6 min walk completed 08/16/2019, distance walked 950 feet, 289.75 meters, duration of test 6 minutes  -Baseline CPET completed 10/17/2019       ? S/P ablation of atrial fibrillation 05/18/2018   ? Lightheadedness 03/16/2018   ? Hospitalization within last 30 days 12/24/2017   ? Atrial fibrillation with RVR (HCC) 12/14/2017   ? Chronic kidney disease, stage 4 (severe) (HCC) 12/13/2017   ? Junctional bradycardia 12/12/2017   ? Bilateral leg edema 12/11/2017   ? On amiodarone therapy 12/11/2017   ? Heart failure with preserved ejection fraction (HCC) 01/01/2016   ? NSVT (nonsustained ventricular tachycardia) 06/12/2015   ? Medication side effects 11/09/2014   ? Heart palpitations 09/19/2014   ? Longstanding persistent atrial fibrillation (HCC) 03/17/2013     03/26/2018 - ECHO:  Moderate concentric LVH seen. The EF is about 55%.  The atria are slightly dilated.  The RV function is mildly decreased.  Mild to moderate MR and TR seen.  Mild AI is present.  Based on the gradients and appearance, the AS appears to be mild. The mean gradient is 16 mm Hg with a peak velocity of 2.6 m/s.  03/26/2018 - Zio Patch:  Predominant rhythm was normal sinus.  Two runs of ventricular tachycardia occurred, they were monomorphic, the longest run lasted 3 seconds, this episode occurred on April 08, 2018 at 9:41 PM.  One episode of AIVR also appeared to be present, it lasted approximately 4.6 seconds  Brief and rare episodes of SVT that probably represent atrial fibrillation, the longest lasting 6.3 seconds.  No evidence of atrial flutter and no evidence of high degree AV block.  05/12/2018 - LAAA:  Persistent Atrial Fibrillation status post successful pulmonary vein antral isolation.  Cavotricuspid Isthmus Ablation.  Ablation of Fractionated Intracardiac Electrograms.  Ablation for an Additional Discrete Arrhythmia-LA AFL after AFIB/PVI Ablation  06/23/2018 - Cardioversion:  Successful direct current cardioversion from symptomatic atrial fibrillation to sinus rhythm.  03/16/2019 - DCCV:  Successful direct current cardioversion from atrial fibrillation to sinus rhythm.     ? Systolic murmur of aorta 02/04/2013   ? SOB (shortness of breath) 02/04/2013   ? Carotid bruit present 01/23/2012   ? Overweight (BMI 25.0-29.9) 01/30/2011   ? Essential hypertension 01/30/2009   ? Hyperlipidemia 01/30/2009     Hyperlipidemia, on statin therapy     ? Skin cancer 01/30/2009   ? Seasonal allergic reaction 01/30/2009         Review of Systems   All other systems reviewed and are negative.      Physical Exam    General Appearance: In NAD.   Orientation: oriented to person, place and time   Affect & Mood: appropriate and sustained affect   Language and Memory: patient responsive and seems to comprehend information   Neurologic Exam: neurological assessment grossly intact   Vital signs were reviewed  Packing in right ear  ?  Cardiovascular Studies:    ? Echo 12/07/2020 the left ventricular systolic function is normal. The visually estimated ejection fraction is 55%.  ? The RV is moderately dilated with reduced systolic function. Pacemaker lead present in the ventricle.  ? Moderate biatrial dilatation.  ? Mitral annular calcification, no stenosis, severe mitral valve regurgitation.  ? Severe tricuspid valve regurgitation.  ? There is a well seated, normal functioning TAVR #26 Sapien S3 Ultra. Normal MG~11 mmHg,no paravalvular/transvalvular regurgitation.  ? Pulmonary hypertension, estimated PAP 46 mmHg.?  Compared to the previous study dated 10/25/2020: The tricuspid valve regurgitation has progressed to severe, the pulmonary artery pressure previously was 32 mmHg.    CHEST: 09/28/2020  1. Thickening and calcification of the aortic valve with a normal caliber   ascending thoracic aorta.   2. Development of several sub-5 mm nodules scattered throughout both   lungs. These are nonspecific though are most likely infectious or   inflammatory. In a low-risk patient no follow-up needed. In a high-risk   patient follow-up chest CT at 12 months is suggested. ?   3. Cardiomegaly and coronary artery disease   4. Pulmonary hypertension   ABDOMEN AND PELVIS:   1. Normal caliber abdominal aorta and common iliac arteries with moderate   diffuse atherosclerosis.   2. Moderate to severe focal stenosis at the origin of the SMA.   3. Moderate distal colonic diverticulosis. ?     Cardiovascular  Health Factors  Vitals BP Readings from Last 3 Encounters:   05/22/21 128/58   05/14/21 114/52   04/04/21 134/44     Wt Readings from Last 3 Encounters:   05/22/21 74.1 kg (163 lb 6.4 oz)   05/14/21 74.4 kg (164 lb)   04/16/21 74.4 kg (164 lb)     BMI Readings from Last 3 Encounters:   05/22/21 25.59 kg/m?   05/14/21 28.15 kg/m?   04/16/21 28.15 kg/m?      Smoking Social History     Tobacco Use   Smoking Status Never   Smokeless Tobacco Never      Lipid Profile Cholesterol   Date Value Ref Range Status   09/17/2020 145 <200 MG/DL Final     HDL   Date Value Ref Range Status   09/17/2020 64 >40 MG/DL Final     LDL   Date Value Ref Range Status   09/17/2020 72 <100 mg/dL Final     Triglycerides   Date Value Ref Range Status   09/17/2020 87 <150 MG/DL Final      Blood Sugar Hemoglobin A1C   Date Value Ref Range Status   10/22/2020 6.4 (H) 4.0 - 6.0 % Final     Comment:     The ADA recommends that most patients with type 1 and type 2 diabetes maintain   an A1c level <7%.       Glucose   Date Value Ref Range Status   05/20/2021 171 (H) 70 - 105 Final   03/18/2021 129 (H) 70 - 105 Final   03/12/2021 146 (H) 70 - 105 Final     Glucose, POC   Date Value Ref Range Status   02/13/2021 93 70 - 100 MG/DL Final   16/12/9602 540 (H) 70 - 100 MG/DL Final   98/01/9146 829 (H) 70 - 100 MG/DL Final   56/21/3086 578 (H) 70 - 100 MG/DL Final          Problems Addressed Today  No diagnosis found.    Assessment and Plan     Wild-type ATTR amyloidosis:   -He has had a previous technetium pyrophosphate scan on 06/28/2019 that showed a 2-hour heart/contralateral lung ratio of 1.97 with visual semiquantitative grading grade 3, abnormal and highly suggestive of ATTR cardiac amyloidosis.   - He has had kappa 7.45, lambda 4.07 and free light chain ratio 1.83 on 07/25/2019  -He had serum electrophoresis that showed no paraprotein.   -See 6-minute walk noted above  -He has completed a KCC Q score that was 72.92 indicative of fair to good quality of life.    -He is on tafamidis 61 mg daily.    -He had a baseline CPET on 7/19 2021,, his repeat cardiopulmonary exercise test on 11/11 showed moderately reduced aerobic capacity 54% of predicted VO2 maximum association with abnormally early onset of enterobiasis with anaerobic threshold 41% of VO2 max.  Very similar to his previous CPET in 2021.  -He had repeat TC PYP on 06/11/2020, it showed 2-hour heart/contralateral lung ratio reduced to 1.7, visual semiquantitative grading scale remains at 3.  ?   Heart failure with preserved ejection fraction:  He is currently on Bumex 6 mg twice daily and metolazone 5 mg daily.[name]  His CardioMEMS reading today is 29, last 2 days have been 26-27.  Threshold is 20-24,    6 Minute walk distance historical values 05/22/2021 02/13/2021 06/26/2020 08/16/2019   Distance Walked in Meters 259.25 305 152.5 289.75  Overall [name] is doing very good.  Concern is his progressive CKD.  BUN elevated.  May need hemodialysis at some point.  Will not make any change in the medications or diuretics.  Continue to follow closely    47 minutes of time spent with patient and family.  Greater than 30 minutes of this time spent counseling regarding heart failure with reduced ejection fraction, medications and volume control.  Vanetta Shawl M.D  [facility] Cardiologist      Current Medications (including today's revisions)  ? acetaminophen (TYLENOL) 325 mg tablet Take two tablets by mouth every 6 hours as needed.   ? allopurinoL (ZYLOPRIM) 300 mg tablet Take one tablet by mouth daily. Take with food.   ? amiodarone (PACERONE) 400 mg tablet Take one tablet by mouth daily.   ? bumetanide (BUMEX) 2 mg tablet Take three tablets by mouth twice daily.   ? cholecalciferol (vitamin D3) (VITAMIN D3) 1,000 units tablet Take one tablet by mouth daily.   ? docusate (COLACE) 100 mg capsule Take one capsule by mouth twice daily.   ? empagliflozin (JARDIANCE) 10 mg tablet Take one tablet by mouth daily.   ? ferrous sulfate (FEOSOL) 325 mg (65 mg iron) tablet Take one tablet by mouth twice daily. Take on an empty stomach at least 1 hour before or 2 hours after food.   ? fluorouraciL (EFUDEX) 5 % topical cream Apply to rough spots on hands and arms twice daily for 4 weeks   ? fluticasone/salmeterol (ADVAIR) 100/50 mcg inhalation disk Inhale one puff by mouth into the lungs daily.   ? lactobacillus rhamnosus (GG) (CULTURELLE) 10 billion cell cap Take one capsule by mouth daily with breakfast.   ? magnesium oxide (MAG-OX) 400 mg (241.3 mg magnesium) tablet Take one tablet by mouth daily.   ? metOLazone (ZAROXOLYN) 5 mg tablet Take one tablet by mouth daily. 03/12/2021 Take Metolazone 5 mg 30-60 minutes PRIOR to Bumex.   ? omeprazole DR (PRILOSEC) 20 mg capsule Take one capsule by mouth daily before breakfast.   ? polyethylene glycol 3350 (MIRALAX) 17 g packet Take one packet by mouth as Needed.   ? potassium chloride SR (K-DUR) 10 mEq tablet Take six tablets by mouth three times daily.   ? rivaroxaban (XARELTO) 15 mg tablet Take one tablet by mouth daily with breakfast. Take with food.   ? rosuvastatin (CRESTOR) 5 mg tablet Take one tablet by mouth three times weekly.   ? senna (SENOKOT) 8.6 mg tablet Take two tablets by mouth twice daily as needed for Constipation.   ? sildenafiL (VIAGRA) 100 mg tablet TAKE 1 TABLET BY MOUTH ONCE DAILY AS NEEDED FOR SEXUAL ACTIVITY   ? tafamidis (VYNDAMAX) 61 mg capsule Take one capsule by mouth daily.   ? VENTOLIN HFA 90 mcg/actuation inhaler Inhale one puff by mouth into the lungs as Needed.

## 2021-05-28 ENCOUNTER — Encounter: Admit: 2021-05-28 | Discharge: 2021-05-28 | Payer: MEDICARE

## 2021-05-29 ENCOUNTER — Encounter: Admit: 2021-05-29 | Discharge: 2021-05-29 | Payer: MEDICARE

## 2021-05-29 MED ORDER — BUMETANIDE 2 MG PO TAB
ORAL_TABLET | 3 refills | Status: AC
Start: 2021-05-29 — End: ?

## 2021-05-29 MED FILL — VYNDAMAX 61 MG PO CAP: 61 mg | ORAL | 30 days supply | Qty: 30 | Fill #10 | Status: AC

## 2021-05-31 ENCOUNTER — Encounter: Admit: 2021-05-31 | Discharge: 2021-05-31 | Payer: MEDICARE

## 2021-05-31 NOTE — Telephone Encounter
Pt r/s nuclear stress test today until 06/04/21. Call placed to Pt's for symptom check. No answer. LVM for pt to call if having any increase in SOA, DOE, Weight gain, Edema or any heart concerns. Sent PAD's to Provider.      DAILY CardioMEMS READINGS    Goal PA Diastolic: 20-24 mmHg  PA Diastolic Thresholds: 22 mmHg          CARDIAC MEDS:    Home Medications    Medication Sig   acetaminophen (TYLENOL) 325 mg tablet Take two tablets by mouth every 6 hours as needed.   allopurinoL (ZYLOPRIM) 300 mg tablet Take one tablet by mouth daily. Take with food.   amiodarone (PACERONE) 400 mg tablet Take one tablet by mouth daily.   bumetanide (BUMEX) 2 mg tablet TAKE 3 TABLETS BY MOUTH TWICE DAILY   cholecalciferol (vitamin D3) (VITAMIN D3) 1,000 units tablet Take one tablet by mouth daily.   docusate (COLACE) 100 mg capsule Take one capsule by mouth twice daily.   empagliflozin (JARDIANCE) 10 mg tablet Take one tablet by mouth daily.   ferrous sulfate (FEOSOL) 325 mg (65 mg iron) tablet Take one tablet by mouth twice daily. Take on an empty stomach at least 1 hour before or 2 hours after food.   fluorouraciL (EFUDEX) 5 % topical cream Apply to rough spots on hands and arms twice daily for 4 weeks   fluticasone/salmeterol (ADVAIR) 100/50 mcg inhalation disk Inhale one puff by mouth into the lungs daily.   lactobacillus rhamnosus (GG) (CULTURELLE) 10 billion cell cap Take one capsule by mouth daily with breakfast.   magnesium oxide (MAG-OX) 400 mg (241.3 mg magnesium) tablet Take one tablet by mouth daily.   metOLazone (ZAROXOLYN) 5 mg tablet Take one tablet by mouth daily. 03/12/2021 Take Metolazone 5 mg 30-60 minutes PRIOR to Bumex.   omeprazole DR (PRILOSEC) 20 mg capsule Take one capsule by mouth daily before breakfast.   polyethylene glycol 3350 (MIRALAX) 17 g packet Take one packet by mouth as Needed.   potassium chloride SR (K-DUR) 10 mEq tablet Take six tablets by mouth three times daily.   rivaroxaban (XARELTO) 15 mg tablet Take one tablet by mouth daily with breakfast. Take with food.   rosuvastatin (CRESTOR) 5 mg tablet Take one tablet by mouth three times weekly.   senna (SENOKOT) 8.6 mg tablet Take two tablets by mouth twice daily as needed for Constipation.   sildenafiL (VIAGRA) 100 mg tablet TAKE 1 TABLET BY MOUTH ONCE DAILY AS NEEDED FOR SEXUAL ACTIVITY   tafamidis (VYNDAMAX) 61 mg capsule Take one capsule by mouth daily.   VENTOLIN HFA 90 mcg/actuation inhaler Inhale one puff by mouth into the lungs as Needed.       RECENT LABS:    Basic Metabolic Profile    Lab Results   Component Value Date/Time    NA 135 (L) 05/20/2021 12:00 AM    K 3.5 05/20/2021 12:00 AM    CA 9.3 05/20/2021 12:00 AM    CL 91 (L) 05/20/2021 12:00 AM    CO2 27 05/20/2021 12:00 AM    GAP 17 (H) 05/20/2021 12:00 AM    Lab Results   Component Value Date/Time    BUN 121 (H) 05/20/2021 12:00 AM    CR 3.9 (H) 05/20/2021 12:00 AM    GLU 171 (H) 05/20/2021 12:00 AM          UPCOMING APPOINTMENTS:    Future Appointments   Date Time Provider Department Center  06/04/2021  9:00 AM [place] [facility] CVM Procedur   06/07/2021 12:30 PM [facility] Procedur   08/20/2021 10:30 AM Fredricka Bonine, APRN-NP [facility] CVM Exam   09/24/2021  3:20 PM [name], MD [facility]

## 2021-05-31 NOTE — Telephone Encounter
Geronimo Running, APRN-NP  [name]  Caller: Unspecified (Today, 12:32 PM)  Thanks for the update. From looking at Dr. Trena Platt last note he had made no medication changes. I think [name] knows to call us if he is having any worsening symptoms. DM

## 2021-06-03 ENCOUNTER — Encounter: Admit: 2021-06-03 | Discharge: 2021-06-03 | Payer: MEDICARE

## 2021-06-03 DIAGNOSIS — I5032 Chronic diastolic (congestive) heart failure: Secondary | ICD-10-CM

## 2021-06-03 NOTE — Telephone Encounter
-----   Message from Arther Dames, RN sent at 05/31/2021  4:36 PM CST -----  Regarding: FW: YMR P-Wave Sensitivity is <2x safety margin    ----- Message -----  From: Lester Kinsman, RN  Sent: 05/31/2021   4:29 PM CST  To: Arther Dames, RN, Haskell Riling, MD  Subject: Melton Alar: YMR P-Wave Sensitivity is <2x safety mar#    Please see message below. Recs?  ----- Message -----  From: Richardine Service, RN  Sent: 05/31/2021   4:11 PM CST  To: Cvm Nurse Ep Team A  Subject: YMR P-Wave Sensitivity is <2x safety margin      We received a remote transmission today which shows P-wave Amplitude measured 0.26m. Sensitivity is set to 0.39m This is less than the appropriate safety margin which could lead to under-sensing P-waves. They are 95.7% Atrial paced since 03/01/21. Presenting rhythm shows AP-VS 66 bpm with PR ~38061mWould you like them to come in for device programming to lower sensitivity to 0.86m105m   Thanks,   Brittney/device team

## 2021-06-04 ENCOUNTER — Encounter: Admit: 2021-06-04 | Discharge: 2021-06-04 | Payer: MEDICARE

## 2021-06-04 ENCOUNTER — Ambulatory Visit: Admit: 2021-06-04 | Discharge: 2021-06-04 | Payer: MEDICARE

## 2021-06-04 DIAGNOSIS — I5033 Acute on chronic diastolic (congestive) heart failure: Secondary | ICD-10-CM

## 2021-06-04 MED ORDER — RP DX TC-99M PYROPHOSPHATE MCI
15 | Freq: Once | INTRAVENOUS | 0 refills | Status: CP
Start: 2021-06-04 — End: ?

## 2021-06-06 ENCOUNTER — Encounter: Admit: 2021-06-06 | Discharge: 2021-06-06 | Payer: MEDICARE

## 2021-06-06 NOTE — Telephone Encounter
pts spouse called asking for call back to discuss results.

## 2021-06-07 ENCOUNTER — Encounter: Admit: 2021-06-07 | Discharge: 2021-06-07 | Payer: MEDICARE

## 2021-06-07 ENCOUNTER — Ambulatory Visit: Admit: 2021-06-07 | Discharge: 2021-06-07 | Payer: MEDICARE

## 2021-06-07 DIAGNOSIS — I5032 Chronic diastolic (congestive) heart failure: Secondary | ICD-10-CM

## 2021-06-07 DIAGNOSIS — E8582 Wild-type transthyretin-related (ATTR) amyloidosis: Secondary | ICD-10-CM

## 2021-06-07 DIAGNOSIS — I5033 Acute on chronic diastolic (congestive) heart failure: Secondary | ICD-10-CM

## 2021-06-07 NOTE — Telephone Encounter
Ray Boards, MD  Raynelle Jan, BSN; Cvm Nurse Hf Team [place] 1 hour ago (3:43 PM)       His PYP scan has shown that amyloid burden in the heart is going down. His amyloid medication is working.            ----- Message from Ray Running, APRN-NP sent at 06/06/2021 8:15 AM CST -----  Ray [name] repeat PYP shows 2-hour heart/contralateral lung ratio of 1.55 with visual grade 2. This is improved from his initial PYP in 2021 that showed 2-hour heart contralateral lung ratio 1.97 with visual grade 3. Would you let her know the good results? Thank you, daily

## 2021-06-13 ENCOUNTER — Encounter: Admit: 2021-06-13 | Discharge: 2021-06-13 | Payer: MEDICARE

## 2021-06-13 NOTE — Telephone Encounter
Placed PC to pt, no answer. Per PHI, LVM requested pt to send Cardiomems reading today as his PAD was up yesterday and to call back to discuss symptoms. Also sent MyChart message with questions and concerns.    DAILY CardioMEMS READINGS    Goal PA Diastolic: 95-09 mmHg  PA Diastolic Thresholds: 32-67 mmHg          CARDIAC MEDS:    Home Medications    Medication Sig   amiodarone (PACERONE) 400 mg tablet Take one tablet by mouth daily.   bumetanide (BUMEX) 2 mg tablet TAKE 3 TABLETS BY MOUTH TWICE DAILY   empagliflozin (JARDIANCE) 10 mg tablet Take one tablet by mouth daily.   magnesium oxide (MAG-OX) 400 mg (241.3 mg magnesium) tablet Take one tablet by mouth daily.   metOLazone (ZAROXOLYN) 5 mg tablet Take one tablet by mouth daily. 03/12/2021 Take Metolazone 5 mg 30-60 minutes PRIOR to Bumex.   potassium chloride SR (K-DUR) 10 mEq tablet Take six tablets by mouth three times daily.   rivaroxaban (XARELTO) 15 mg tablet Take one tablet by mouth daily with breakfast. Take with food.   rosuvastatin (CRESTOR) 5 mg tablet Take one tablet by mouth three times weekly.   tafamidis (VYNDAMAX) 61 mg capsule Take one capsule by mouth daily.       RECENT LABS:    Basic Metabolic Profile    Lab Results   Component Value Date/Time    NA 135 (L) 05/20/2021 12:00 AM    K 3.5 05/20/2021 12:00 AM    CA 9.3 05/20/2021 12:00 AM    CL 91 (L) 05/20/2021 12:00 AM    CO2 27 05/20/2021 12:00 AM    GAP 17 (H) 05/20/2021 12:00 AM    Lab Results   Component Value Date/Time    BUN 121 (H) 05/20/2021 12:00 AM    CR 3.9 (H) 05/20/2021 12:00 AM    GLU 171 (H) 05/20/2021 12:00 AM          UPCOMING APPOINTMENTS:    Future Appointments   Date Time Provider [place]   08/20/2021 10:30 AM Geronimo Running, APRN-NP [facility] CVM Exam   08/20/2021 11:00 AM [place] [facility] CVM Procedur   09/24/2021  3:20 PM Denita Lung, MD [facility]

## 2021-06-26 ENCOUNTER — Encounter: Admit: 2021-06-26 | Discharge: 2021-06-26 | Payer: MEDICARE

## 2021-06-26 MED FILL — VYNDAMAX 61 MG PO CAP: 61 mg | ORAL | 30 days supply | Qty: 30 | Fill #11 | Status: AC

## 2021-06-26 NOTE — Progress Notes
The specialty pharmacy is currently waiting on the following information for Ray [name]'s specialty medication, Vyndamax:    Patient's signature on the [facility] application.    The specialty pharmacy team has requested this information and until it is received the specialty pharmacy will not be able to continue the process for specialty medication access.    [place] Patient Advocate  [phone]

## 2021-06-28 ENCOUNTER — Encounter: Admit: 2021-06-28 | Discharge: 2021-06-28 | Payer: MEDICARE

## 2021-07-04 ENCOUNTER — Encounter: Admit: 2021-07-04 | Discharge: 2021-07-04 | Payer: MEDICARE

## 2021-07-04 ENCOUNTER — Ambulatory Visit: Admit: 2021-07-04 | Discharge: 2021-07-04 | Payer: MEDICARE

## 2021-07-04 DIAGNOSIS — I5032 Chronic diastolic (congestive) heart failure: Secondary | ICD-10-CM

## 2021-07-04 LAB — BASIC METABOLIC PANEL
GLUCOSE,PANEL: 109 — ABNORMAL HIGH (ref 70–105)
POTASSIUM: 3.6 (ref 3.5–5.1)
SODIUM: 133 — ABNORMAL LOW (ref 136–145)

## 2021-07-04 NOTE — Telephone Encounter
Pt had sent mychart reporting he had labs drawn at [facility] 07/02/21.  Requested lab results from hospital in [place], [place].       Ray [name] Red "Ray [name]"  [name], [place], BSN 33 minutes ago (10:22 AM)    OK, finally getting back after recovering from covid.  Ray [name] did his blood draw on April 4th.  So far I have not seen that you have record of it.  It was done at [facility] in [place].      [name], [place], BSN 2 weeks ago    Ray [name] [name], Ray [name] Areola, APRN-NP  Ray [name] [name], BSN  Yes, needs to follow with primary care physician about COVID medications. He is high risk for this and his eGFR is low. Dr. Manuella Ghazi is out of the office this week for vacation. DM

## 2021-07-07 ENCOUNTER — Encounter: Admit: 2021-07-07 | Discharge: 2021-07-07 | Payer: MEDICARE

## 2021-07-07 ENCOUNTER — Emergency Department: Admit: 2021-07-07 | Discharge: 2021-07-07 | Payer: MEDICARE

## 2021-07-07 DIAGNOSIS — S61217A Laceration without foreign body of left little finger without damage to nail, initial encounter: Secondary | ICD-10-CM

## 2021-07-07 DIAGNOSIS — S0191XA Laceration without foreign body of unspecified part of head, initial encounter: Secondary | ICD-10-CM

## 2021-07-07 DIAGNOSIS — W19XXXA Unspecified fall, initial encounter: Secondary | ICD-10-CM

## 2021-07-07 LAB — PROTIME INR (PT)
INR: 1.6 — ABNORMAL HIGH (ref 0.8–1.2)
PROTIME: 18 s — ABNORMAL HIGH (ref 9.5–14.2)

## 2021-07-07 LAB — CBC AND DIFF: WBC COUNT: 7.2 K/UL (ref 4.5–11.0)

## 2021-07-07 LAB — COMPREHENSIVE METABOLIC PANEL
POTASSIUM: 4.8 MMOL/L — ABNORMAL LOW (ref 3.5–5.1)
SODIUM: 128 MMOL/L — ABNORMAL LOW (ref 137–147)

## 2021-07-07 MED ORDER — LIDOCAINE-EPINEPHRINE 1 %-1:100,000 IJ SOLN
30 mL | Freq: Once | INTRAMUSCULAR | 0 refills | Status: CP
Start: 2021-07-07 — End: ?
  Administered 2021-07-07: 15:00:00 8 mL via INTRAMUSCULAR

## 2021-07-07 MED ORDER — LIDOCAINE 5 % TP PTMD
1 | Freq: Once | TOPICAL | 0 refills | Status: DC
Start: 2021-07-07 — End: 2021-07-07
  Administered 2021-07-07: 16:00:00 1 via TOPICAL

## 2021-07-07 MED ORDER — BACITRACIN ZINC 500 UNIT/GRAM TP OINT
Freq: Once | TOPICAL | 0 refills | Status: CP
Start: 2021-07-07 — End: ?
  Administered 2021-07-07: 16:00:00 via TOPICAL

## 2021-07-07 NOTE — ED Notes
X-ray at bedside.

## 2021-07-07 NOTE — ED Provider Notes
Ray [name] is a 86 y.o. male.    Chief Complaint:  Chief Complaint   Patient presents with   ? Fall     Pt tripped over curve and fell on his face. Pt is on xeralto.        History of Present Illness:  Patient is an 86 year old male with a past medical history of amyloidosis C/B atrial fibrillation, on chronic anticoagulation, CKD, HTN, HLD.  Presents today for a chief complaint of fall.  Patient was walking by his church today when he tripped on the curb, fell, and landed on his face.  He did not lose consciousness.  Required some help throughout the ground.  Suffered lacerations and abrasions to his face, bilateral hands, and left knee.  Patient reports he has fallen a few times in the last several months, but otherwise ambulates well at home, does not require any assistive devices.  Denies any other acute complaints.  Has had prior Mohs surgeries to his forehead.          Review of Systems:  Review of Systems   Constitutional: Negative for diaphoresis and fever.   HENT: Negative for sore throat and trouble swallowing.    Eyes: Negative for pain and visual disturbance.   Respiratory: Negative for chest tightness and shortness of breath.    Cardiovascular: Negative for chest pain and palpitations.   Gastrointestinal: Negative for abdominal pain, diarrhea, nausea and vomiting.   Genitourinary: Negative for difficulty urinating, dysuria and hematuria.   Musculoskeletal: Negative for back pain and neck pain.   Skin: Positive for wound. Negative for pallor and rash.   Neurological: Negative for syncope and light-headedness.       Allergies:  Beta blockers [beta-blockers (beta-adrenergic blocking agts)]    Past Medical History:  Medical History:   Diagnosis Date   ? Amyloidosis (HCC)     wild type   ? 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 Asthma    ? Chronic kidney disease    ? Essential hypertension 01/30/2009   ? History of atrial fibrillation    ? Hyperlipidemia 01/30/2009   ? Hypertension 01/30/2009   ? Nonrheumatic aortic valve stenosis 09/06/2020   ? Nonrheumatic mitral valve regurgitation 09/06/2020   ? Nonrheumatic tricuspid valve regurgitation 09/06/2020   ? Seasonal allergic reaction 01/30/2009   ? Skin cancer 01/30/2009       Past Surgical History:  Surgical History:   Procedure Laterality Date   ? INTRACARDIAC CATHETER ABLATION WITH COMPREHENSIVE ELECTROPHYSIOLOGIC EVALUATION - ATRIAL FIBRILLATION N/A 05/12/2018    Performed by Jen Mow, MD at [facility] EP LAB   ? ATRIAL ABLATION SURGERY  2021   ? CATHETERIZATION RIGHT HEART WITH INSERTION PULMONARY ARTERY SENSOR N/A 12/15/2019    Performed by Harley Alto, MD at [facility] CATH LAB   ? ANGIOGRAPHY CORONARY ARTERY WITH LEFT HEART CATHETERIZATION N/A 09/17/2020    Performed by Harley Alto, MD at [facility] CATH LAB   ? POSSIBLE PERCUTANEOUS CORONARY STENT PLACEMENT WITH ANGIOPLASTY N/A 09/17/2020    Performed by Harley Alto, MD at [facility] CATH LAB   ? Transcatheter Aortic Valve Replacement - Femoral Artery N/A 10/24/2020    Performed by Arby Barrette, MD at [facility] CVOR   ? CATHETERIZATION RIGHT HEART N/A 11/26/2020    Performed by Hajj, Alfredo Martinez, MD at [facility] CATH LAB   ? ESOPHAGOGASTRODUODENOSCOPY WITH BIOPSY - FLEXIBLE N/A 11/28/2020    Performed by Samuel Jester, MD at [facility] ENDO   ?  GASTROINTESTINAL TRACT IMAGING ESOPHAGUS THROUGH ILEUM - INTRALUMINAL N/A 11/28/2020    Performed by Samuel Jester, MD at [facility] ENDO   ? COLONOSCOPY WITH BIOPSY - FLEXIBLE N/A 11/28/2020    Performed by Samuel Jester, MD at [facility] ENDO   ? INSERTION/ REPLACEMENT PERMANENT PACEMAKER WITH ATRIAL AND VENTRICULAR LEAD Left 11/29/2020    Performed by Donnelly Stager, MD at [facility] EP LAB   ? ACHILLES TENDON SURGERY Right    ? CARDIAC CATHERIZATION  6/202022   ? CARPAL TUNNEL RELEASE Bilateral    ? HX APPENDECTOMY     ? LUMBAR SPINE SURGERY     ? MOHS SURGERY     ? ROTATOR CUFF REPAIR Right        Pertinent medical/surgical history reviewed    Social History:  Social History     Tobacco Use   ? Smoking status: Never ? Smokeless tobacco: Never   Vaping Use   ? Vaping Use: Never used   Substance Use Topics   ? Alcohol use: Yes     Alcohol/week: 2.0 standard drinks     Types: 1 Glasses of wine, 1 Cans of beer per week     Comment: occasional vodka tonic/evening   ? Drug use: No     Social History     Substance and Sexual Activity   Drug Use No             Family History:  Family History   Problem Relation Age of Onset   ? Stroke Father        Vitals:  ED Vitals    Date and Time T BP P RR SPO2P SPO2 User   07/07/21 1100 -- 156/60 61 12 PER MINUTE 60 99 % LH   07/07/21 1030 -- -- 63 12 PER MINUTE 62 100 % LH   07/07/21 1000 -- 150/55 60 13 PER MINUTE 60 96 % LH   07/07/21 0930 -- 154/76 82 14 PER MINUTE 82 97 % LH   07/07/21 0900 -- 147/59 60 10 PER MINUTE 60 96 % LH   07/07/21 0832 -- 149/60 -- -- 67 96 % KW   07/07/21 0805 36.7 ?C (98.1 ?F) 151/81 90 -- -- 100 % KW          Physical Exam:  Physical Exam  Vitals and nursing note reviewed.   Constitutional:       General: He is not in acute distress.     Appearance: He is not ill-appearing.   HENT:      Head: Normocephalic and atraumatic.      Mouth/Throat:      Mouth: Mucous membranes are moist.      Pharynx: Oropharynx is clear.   Eyes:      Extraocular Movements: Extraocular movements intact.      Pupils: Pupils are equal, round, and reactive to light.   Cardiovascular:      Rate and Rhythm: Normal rate and regular rhythm.      Pulses: Normal pulses.      Heart sounds: Normal heart sounds.   Pulmonary:      Effort: Pulmonary effort is normal.      Breath sounds: Normal breath sounds.   Abdominal:      General: There is no distension.      Palpations: Abdomen is soft.      Tenderness: There is no abdominal tenderness.   Musculoskeletal:         General: No deformity. Normal  range of motion.   Skin:     General: Skin is warm and dry.      Capillary Refill: Capillary refill takes less than 2 seconds.      Comments: Stellate laceration with surrounding abrasion to anterior forehead, bleeding poorly controlled.  Laceration to his left fifth pinky, half centimeter diameter.  Otherwise scattered abrasions to hands/knuckles, small abrasion to his left knee.  Bleeding moderately well controlled with pressure.  Abrasion to the bridge of his nose with some slow bleeding.   Neurological:      General: No focal deficit present.      Mental Status: He is alert and oriented to person, place, and time.         Laboratory Results:  Labs Reviewed   PROTIME INR (PT) - Abnormal       Result Value Ref Range Status    Protime 18.0 (*) 9.5 - 14.2 SEC Final    INR 1.6 (*) 0.8 - 1.2 Final   CBC AND DIFF - Abnormal    White Blood Cells 7.2  4.5 - 11.0 K/UL Final    RBC 3.40 (*) 4.4 - 5.5 M/UL Final    Hemoglobin 11.4 (*) 13.5 - 16.5 GM/DL Final    Hematocrit 04.5 (*) 40 - 50 % Final    MCV 99.8  80 - 100 FL Final    MCH 33.5  26 - 34 PG Final    MCHC 33.5  32.0 - 36.0 G/DL Final    RDW 40.9 (*) 11 - 15 % Final    Platelet Count 199  150 - 400 K/UL Final    MPV 9.3  7 - 11 FL Final    Neutrophils 72  41 - 77 % Final    Lymphocytes 12 (*) 24 - 44 % Final    Monocytes 13 (*) 4 - 12 % Final    Eosinophils 2  0 - 5 % Final    Basophils 1  0 - 2 % Final    Absolute Neutrophil Count 5.16  1.8 - 7.0 K/UL Final    Absolute Lymph Count 0.86 (*) 1.0 - 4.8 K/UL Final    Absolute Monocyte Count 0.95 (*) 0 - 0.80 K/UL Final    Absolute Eosinophil Count 0.12  0 - 0.45 K/UL Final    Absolute Basophil Count 0.09  0 - 0.20 K/UL Final    MDW (Monocyte Distribution Width) 17.8  <20.7 Final   COMPREHENSIVE METABOLIC PANEL - Abnormal    Sodium 128 (*) 137 - 147 MMOL/L Final    Potassium 4.8  3.5 - 5.1 MMOL/L Final    Chloride 89 (*) 98 - 110 MMOL/L Final    Glucose 148 (*) 70 - 100 MG/DL Final    Blood Urea Nitrogen 102 (*) 7 - 25 MG/DL Final    Creatinine 8.11 (*) 0.4 - 1.24 MG/DL Final    Calcium 9.5  8.5 - 10.6 MG/DL Final    Total Protein 8.2 (*) 6.0 - 8.0 G/DL Final    Total Bilirubin 0.5  0.3 - 1.2 MG/DL Final    Albumin 4.0  3.5 - 5.0 G/DL Final    Alk Phosphatase 138 (*) 25 - 110 U/L Final    AST (SGOT) 56 (*) 7 - 40 U/L Final    CO2 26  21 - 30 MMOL/L Final    ALT (SGPT) 33  7 - 56 U/L Final    Anion Gap 13 (*) 3 - 12 Final  eGFR 17 (*) >60 mL/min Final          Radiology Interpretation:    FINGER MIN 2V LEFT FIFTH   Final Result         1.  Limited evaluation of the fifth digit due to flexion deformity. No definite acute fracture. Chronic appearing deformity of the middle and distal phalanges may be sequela of remote trauma.      2.  Moderate scattered degenerative changes throughout the hand. 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                Finalized by Kimber Relic, M.D. on 07/07/2021 9:19 AM. Dictated by Kimber Relic, M.D. on 07/07/2021 9:17 AM.         CT HEAD WO CONTRAST   Final Result         Head:       1.  Frontal scalp laceration-contusion without acute intracranial hemorrhage or calvarial fracture.   2.  Mild generalized cerebral volume loss and mild nonspecific white matter disease, likely due to chronic microvascular ischemic changes.      Maxillofacial:       Stable chronic nasal bone fracture deformity without obvious acute facial fracture.      Cervical Spine:       1.  No evidence of acute cervical fracture.   2.  Cervical spondylosis and multilevel spondylolisthesis resulting in at least moderate multilevel central spinal stenosis and severe multilevel foraminal stenosis.          Approved by Richarda Osmond, MD on 07/07/2021 8:48 AM      By my electronic signature, I attest that I have personally reviewed the images for this examination and formulated the interpretations and opinions expressed in this report          Finalized by Desma Maxim, M.D. on 07/07/2021 8:53 AM. Dictated by Richarda Osmond, MD on 07/07/2021 8:27 AM.         CT SPINE CERVICAL WO CONTRAST   Final Result         Head:       1.  Frontal scalp laceration-contusion without acute intracranial hemorrhage or calvarial fracture.   2.  Mild generalized cerebral volume loss and mild nonspecific white matter disease, likely due to chronic microvascular ischemic changes.      Maxillofacial:       Stable chronic nasal bone fracture deformity without obvious acute facial fracture.      Cervical Spine:       1.  No evidence of acute cervical fracture.   2.  Cervical spondylosis and multilevel spondylolisthesis resulting in at least moderate multilevel central spinal stenosis and severe multilevel foraminal stenosis.          Approved by Richarda Osmond, MD on 07/07/2021 8:48 AM      By my electronic signature, I attest that I have personally reviewed the images for this examination and formulated the interpretations and opinions expressed in this report          Finalized by Desma Maxim, M.D. on 07/07/2021 8:53 AM. Dictated by Richarda Osmond, MD on 07/07/2021 8:27 AM.         CT MAXIFACIAL/SINUS WO CONTRAST   Final Result         Head:       1.  Frontal scalp laceration-contusion without acute intracranial hemorrhage or calvarial fracture.   2.  Mild generalized cerebral volume loss and mild nonspecific  white matter disease, likely due to chronic microvascular ischemic changes.      Maxillofacial:       Stable chronic nasal bone fracture deformity without obvious acute facial fracture.      Cervical Spine:       1.  No evidence of acute cervical fracture.   2.  Cervical spondylosis and multilevel spondylolisthesis resulting in at least moderate multilevel central spinal stenosis and severe multilevel foraminal stenosis.          Approved by Richarda Osmond, MD on 07/07/2021 8:48 AM      By my electronic signature, I attest that I have personally reviewed the images for this examination and formulated the interpretations and opinions expressed in this report          Finalized by Desma Maxim, M.D. on 07/07/2021 8:53 AM. Dictated by Richarda Osmond, MD on 07/07/2021 8:27 AM.               EKG:      Medical Decision Making:  Ray [name] is a 86 y.o. male who presents with chief complaint as listed above. Based on the history and presentation, the list of differential diagnoses considered included, but was not limited to, fall, ICH, laceration    ED Course  Patient presents for fall with multiple lacerations/abrasions. Vital signs notable for HTN.  Redressed wounds on presentation with quick clot and Coban.    Labs pertinent for No leukocytosis, creatinine at patient's baseline and INR 1.6.  Platelets WNL.  Mildly low sodium..    Imaging showed no acute intracranial or mass effect, no fractures in skull or face.  Left pinky finger x-ray without acute osseous abnormality.    Despite redressing of wounds with quick clot and pressure dressings, patient continues to ooze blood from abrasions on forehead.  Use lidocaine with epinephrine for pain control.  Placed figure-of-eight stitch to control bleeding on forehead.  Rinse lacerations, and closed with absorbable sutures.  Bleeding otherwise well controlled from other sites.  Also use lidocaine to numb up the left fifth digit and closed small laceration dorsal aspect of pinky.  Discussed wound care with patient, placed bacitracin over wounds, and nursing manage wounds as appropriate.  Patient otherwise in stable condition, appropriate for discharge.  Patient was discharged stable condition.         Complexity of Problems Addressed  Patient's active diagnoses as well as contributing pre-existing medical problems include:  Clinical Impression   Fall, initial encounter   Laceration of left little finger without foreign body without damage to nail, initial encounter   Laceration of head without foreign body, unspecified part of head, initial encounter     Evaluation performed for potential threat to life or bodily function during this visit given the initial differential diagnosis and clinical impression(s) as discussed previously in MDM/ED course.    Additional data reviewed:    ? History was obtained from an independent historian: Family  ? Prior non-ED notes reviewed: Clinic note  ? Independent interpretation of diagnostic tests was performed by me: Not in addition to what is mentioned above  ? Patient presentation/management was discussed with the following qualified health care professionals and/or other relevant professionals: Not in addition to what is mentioned above    Risk evaluation:    ? Diagnosis or treatment of patient condition impacted by social determinant of health: None  ? Tests Considered but not performed due to clinical scoring (if not mentioned in ED course, aside  from what is implied by clinical scores listed): None  ? Rationale regarding whether admission or escalation of care considered if not performed (if not mentioned in ED course, aside from what is implied by clinical scores listed): None    ED Scoring:                                Facility Administered Meds:  Medications   lidocaine 1%/EPINEPHrine 1:100,000 injection 30 mL (8 mL Injection Given 07/07/21 1000)   bacitracin zinc topical ointment ( Topical Given 07/07/21 1046)       Clinical Impression:  Clinical Impression   Fall, initial encounter   Laceration of left little finger without foreign body without damage to nail, initial encounter   Laceration of head without foreign body, unspecified part of head, initial encounter       Disposition/Follow up  ED Disposition     ED Disposition   Discharge           Emergency Department: [facility], [place]   [address]  [address]  [place] [address]  [phone]  Go to   As needed    Erskine Emery, MD  [address]  [address]  [place] [address]  [phone]    Schedule an appointment as soon as possible for a visit   As needed      Medications:  Discharge Medication List as of 07/07/2021 11:18 AM          Procedure Notes:  Laceration Repair    Date/Time: 07/07/2021 7:57 AM  Performed by: Kavan Devan, Osborne Oman, MD  Authorized by: Maximino Sarin, MD   Consent: Verbal consent obtained.  Consent given by: patient  Body area: head/neck  Location details: forehead  Laceration length: 3 cm  Tendon involvement: none  Nerve involvement: none  Anesthesia: local infiltration    Anesthesia:  Local Anesthetic: lidocaine 1% with epinephrine  Anesthetic total: 8 mL  Irrigation solution: tap water  Irrigation method: syringe  Amount of cleaning: standard  Debridement: none  Degree of undermining: none  Wound skin closure material used: 5-0 Chromic gut.  Technique: simple  Approximation: close  Approximation difficulty: complex  Dressing: antibiotic ointment  Patient tolerance: patient tolerated the procedure well with no immediate complications  Comments: Figure-of-eight suture to control bleeding.  Closed gaping stellate laceration to anterior forehead with some difficulty, however was able to achieve close approximation.  Additionally placed 2 sutures in left fifth digit for small linear laceration.             Attestation / Supervision:        Lujean Amel, MD

## 2021-07-07 NOTE — ED Notes
Report given to Lauren, RN.

## 2021-07-07 NOTE — ED Notes
Report received from Katie, RN.

## 2021-07-07 NOTE — Unmapped
You were seen for your fall with lacerations to your forehead and finger.  We able to control the bleeding with special stitches.  We approximated the laceration to your forehead as best as we are able.  It will scar and take some time to heal.  You can choose to follow-up with your PCP or plastic surgeon should you have concerns about that.  We have placed bacitracin over your wounds.  Also stitched a laceration on your pinky.  Otherwise dressed everything with appropriate nonadherent dressings.  Continue to dress your wounds with nonadherent dressings or Band-Aids with a small layer of petroleum jelly at home.  We have provided some extra wound care supplies.  We use nonabsorbable sutures so you should not need to follow-up with your primary care doctor for suture removal.  Do not submerge any areas where we placed stitches for approximately 24 hours.  Gentle showers without scrubbing of the affected areas is okay.  Continue place bacitracin on your forehead lacerations twice a day for the next 3 days.

## 2021-07-07 NOTE — ED Notes
Lidocaine placed at bedside for Dr. Raelyn Number. Patient resting on cart, side rails up x 2, call light within reach, urinal within reach. No needs identified at this time.

## 2021-07-07 NOTE — ED Notes
Forehead lac cleaned, dressed with bacitracin and xeroform. Hand lacs cleaned, dressed with bacitracin and xeroform. Knee lacs cleaned, dressed with bacitracin, xeroform, 4x4, and coban. Patient reports he has plenty of xeroform and coban at home. Given rest of bacitracin and coban.

## 2021-07-07 NOTE — ED Notes
Pt presents to the ED via EMS. He reports he was walking into church and tripped forward onto the curb. He takes Xarelto at home. He has a significant round gash to his forehead and a small cut to his nose. Dr Raelyn Number is at bedside and places quickclot onto forehead. Gauze and coban dressing secured to head. Dressing taped to small cut on nose. He has some abrasions to bilateral hands and knuckles as well, most significantly to left pinky finger. He has a wound to his left knee that is dressed with gauze and coban. He has a prior dressing to his right knee from a fall earlier in the week. The patient did not lose consciousness. He is alert and able to answer questions appropriately. Family at bedside.     Pt belongings include:    Dress pants  Glasses  Shoes  Button down shirt  Belt    Belongings at bedside.

## 2021-07-07 NOTE — ED Notes
Pt D/c'd to home with instructions, bacitracin, xeroform, and coban given. Pt verbalizes understanding of discharge orders and importance of follow up with PCP if needed. Patient is AxOx4, showing no signs of distress, and assisted out of ED via Trainer.

## 2021-07-08 ENCOUNTER — Encounter: Admit: 2021-07-08 | Discharge: 2021-07-08 | Payer: MEDICARE

## 2021-07-09 ENCOUNTER — Encounter: Admit: 2021-07-09 | Discharge: 2021-07-09 | Payer: MEDICARE

## 2021-07-09 DIAGNOSIS — Z79899 Other long term (current) drug therapy: Secondary | ICD-10-CM

## 2021-07-09 DIAGNOSIS — I5032 Chronic diastolic (congestive) heart failure: Secondary | ICD-10-CM

## 2021-07-09 NOTE — Progress Notes
Amiodarone Monitoring 07/09/21:     All labs/procedures/office visits are required every 6 months unless ordered differently by the managing provider.    Amiodarone status:  Amiodarone testing needed: Magnesium, TSH with Free T4 Reflex, CXR, ECG and Eye Exam and follow-up in 30 days.    Most recent test results (AST, ALT, TSH w Free T4, Potassium, Magnesium)  Lab Results   Component Value Date/Time    AST 56 (H) 07/07/2021 08:14 AM    ALT 33 07/07/2021 08:14 AM    TSH 2.53 09/21/2019 12:04 PM    FREET4R 1.08 05/28/2015 12:00 AM    K 4.8 07/07/2021 08:14 AM    MG 2.2 11/30/2020 07:43 AM       BP:   BP Readings from Last 1 Encounters:   07/07/21 (!) 156/60       ECG:   Most recent results for 12-Lead ECG   ECG 12-LEAD    Collection Time: 12/07/20  3:55 PM   Result Value Status    VENTRICULAR RATE 70 Final    P-R INTERVAL 314 Final    QRS DURATION 96 Final    Q-T INTERVAL 488 Final    QTC CALCULATION (BAZETT) 527 Final    P AXIS 0 Final    R AXIS 151 Final    T AXIS -67 Final    Impression    Atrial-paced rhythm with prolonged AV conduction  Septal infarct , age undetermined  Abnormal ECG  Confirmed by Nickolas Madrid ([id number]) on 12/22/2020 4:15:03 PM       CXR:   Results for orders placed or performed during the hospital encounter of 11/26/20   CHEST 2 VIEWS    Narrative    CHEST 2 VIEWS     INDICATION: Post device Implantation.    COMPARISON STUDY: Chest radiograph November 29, 2020.    FINDINGS:    Devices: Interval removal of right IJ pulmonary artery catheter. TAVR. Interval placement of a dual-chamber pacemaker with the leads terminating in the right atrium and right ventricle. CardioMEMs.     Lungs/Pleura: The lung volume is normal. Improved bibasilar atelectasis. Small pleural effusions.    Heart and Mediastinum: Similar cardiac silhouette enlargement. Aortic calcifications indicating atherosclerosis.    Skeletal Structures and Soft Tissues: Moderate thoracic spondylosis with diffuse idiopathic skeletal hyperostosis.      Impression    1.  New dual-chamber pacemaker leads as above. No pneumothorax.  2.  Small pleural effusions with improved edema.    By my electronic signature, I attest that I have personally reviewed the images for this examination and formulated the interpretations and opinions expressed in this report       Finalized by Delmar Landau, M.D. on 11/30/2020 10:55 AM. Dictated by Danella Penton, MBBS on 11/30/2020 9:18 AM.         Last Cardiology OV - 05/22/21    Eye exam (every 12 months) - unknown    Use [facility] ADULT CARDIOLOGY [facility]IN[facility] STANDING ORDERS SmartSet for any orders needed.    These criteria follow national best practices as defined by [employer].

## 2021-07-15 ENCOUNTER — Encounter: Admit: 2021-07-15 | Discharge: 2021-07-15 | Payer: MEDICARE

## 2021-07-16 ENCOUNTER — Encounter: Admit: 2021-07-16 | Discharge: 2021-07-16 | Payer: MEDICARE

## 2021-07-19 ENCOUNTER — Encounter: Admit: 2021-07-19 | Discharge: 2021-07-19 | Payer: MEDICARE

## 2021-07-19 NOTE — Telephone Encounter
Ray Running, APRN-NP  [name]  Caller: Unspecified (Today, 9:15 AM)  Glad his weight and symptoms are stable. No changes. Hope Clarene Critchley if on the mend! DM

## 2021-07-19 NOTE — Telephone Encounter
Phone call to patient for symptom check since PADs have been hovering above current threshold range.  Patient denies any current weight gain, swelling, increase in shortness of breath, or other hypervolemic symptoms.  Weight is stable for the past couple weeks at 154 pounds.  He still recovering from a fall on Easter Sunday which required stitches to his head.  He has no complaints at this time.He is aware contact our office if he has any change in symptoms or concerns.  Provider notified.      DAILY CardioMEMS READINGS    Goal PA Diastolic: 22 mmHg  PA Diastolic Thresholds: 20-24 mmHg          CARDIAC MEDS:    Home Medications    Medication Sig   amiodarone (PACERONE) 400 mg tablet Take one tablet by mouth daily.   bumetanide (BUMEX) 2 mg tablet TAKE 3 TABLETS BY MOUTH TWICE DAILY   empagliflozin (JARDIANCE) 10 mg tablet Take one tablet by mouth daily.   magnesium oxide (MAG-OX) 400 mg (241.3 mg magnesium) tablet Take one tablet by mouth daily.   metOLazone (ZAROXOLYN) 5 mg tablet Take one tablet by mouth daily. 03/12/2021 Take Metolazone 5 mg 30-60 minutes PRIOR to Bumex.   potassium chloride SR (K-DUR) 10 mEq tablet Take six tablets by mouth three times daily.   rivaroxaban (XARELTO) 15 mg tablet Take one tablet by mouth daily with breakfast. Take with food.   rosuvastatin (CRESTOR) 5 mg tablet Take one tablet by mouth three times weekly.   sildenafiL (VIAGRA) 100 mg tablet TAKE 1 TABLET BY MOUTH ONCE DAILY AS NEEDED FOR SEXUAL ACTIVITY   tafamidis (VYNDAMAX) 61 mg capsule Take one capsule by mouth daily.       RECENT LABS:    Basic Metabolic Profile    Lab Results   Component Value Date/Time    NA 128 (L) 07/07/2021 08:14 AM    K 4.8 07/07/2021 08:14 AM    CA 9.5 07/07/2021 08:14 AM    CL 89 (L) 07/07/2021 08:14 AM    CO2 26 07/07/2021 08:14 AM    GAP 13 (H) 07/07/2021 08:14 AM    Lab Results   Component Value Date/Time    BUN 102 (H) 07/07/2021 08:14 AM    CR 3.49 (H) 07/07/2021 08:14 AM    GLU 148 (H) 07/07/2021 08:14 AM          UPCOMING APPOINTMENTS:    Future Appointments   Date Time Provider Department Center   08/20/2021 10:30 AM Fredricka Bonine, APRN-NP [facility] CVM Exam   08/20/2021 11:00 AM [place] [facility] 2 [facility] CVM Procedur   09/05/2021  4:00 PM [name]IT[facility] CVM Procedur   09/24/2021  3:20 PM [name], MD [facility]

## 2021-07-22 ENCOUNTER — Encounter: Admit: 2021-07-22 | Discharge: 2021-07-22 | Payer: MEDICARE

## 2021-07-22 MED ORDER — BUMETANIDE 2 MG PO TAB
ORAL_TABLET | 1 refills | Status: AC
Start: 2021-07-22 — End: ?

## 2021-07-24 ENCOUNTER — Encounter: Admit: 2021-07-24 | Discharge: 2021-07-24 | Payer: MEDICARE

## 2021-07-29 ENCOUNTER — Encounter: Admit: 2021-07-29 | Discharge: 2021-07-29 | Payer: MEDICARE

## 2021-07-30 MED FILL — VYNDAMAX 61 MG PO CAP: 61 mg | ORAL | 30 days supply | Qty: 30 | Fill #12 | Status: AC

## 2021-07-31 ENCOUNTER — Encounter: Admit: 2021-07-31 | Discharge: 2021-07-31 | Payer: MEDICARE

## 2021-07-31 MED ORDER — AMOXICILLIN 500 MG PO CAP
2000 mg | ORAL_CAPSULE | Freq: Once | ORAL | 0 refills | 7.00000 days | Status: AC
Start: 2021-07-31 — End: ?

## 2021-08-05 ENCOUNTER — Encounter: Admit: 2021-08-05 | Discharge: 2021-08-05 | Payer: MEDICARE

## 2021-08-05 DIAGNOSIS — I5032 Chronic diastolic (congestive) heart failure: Secondary | ICD-10-CM

## 2021-08-07 ENCOUNTER — Encounter: Admit: 2021-08-07 | Discharge: 2021-08-07 | Payer: MEDICARE

## 2021-08-08 ENCOUNTER — Encounter: Admit: 2021-08-08 | Discharge: 2021-08-08 | Payer: MEDICARE

## 2021-08-08 DIAGNOSIS — Z952 Presence of prosthetic heart valve: Secondary | ICD-10-CM

## 2021-08-08 DIAGNOSIS — I10 Essential (primary) hypertension: Secondary | ICD-10-CM

## 2021-08-08 NOTE — Telephone Encounter
Sent PAD's to APP since pt remains above his thresholds. No change in weight or symptoms.     COPY OF MYCHART   [name] [name] to Corlis Leak, BSN  ? 08/07/21 10:29 AM  We have had company this past week so all routines have been somewhat disrupted. [name] has also begun physical therapy ordered by his doc to get his balance in check. He is taking all medications but diet has been all over the place. He is not swelling or gaiNing weight. I think he has just been overwhelmed with all the company. They are gone now.       DAILY CardioMEMS READINGS    Goal PA Diastolic: 22 mmHg  PA Diastolic Thresholds: 20-24 mmHg          CARDIAC MEDS:    Home Medications    Medication Sig   amiodarone (PACERONE) 400 mg tablet Take one tablet by mouth daily.   bumetanide (BUMEX) 2 mg tablet TAKE 3 TABLETS BY MOUTH TWICE DAILY   empagliflozin (JARDIANCE) 10 mg tablet Take one tablet by mouth daily.   fluticasone/salmeterol (ADVAIR) 100/50 mcg inhalation disk Inhale one puff by mouth into the lungs daily.   magnesium oxide (MAG-OX) 400 mg (241.3 mg magnesium) tablet Take one tablet by mouth daily.   metOLazone (ZAROXOLYN) 5 mg tablet Take one tablet by mouth daily. 03/12/2021 Take Metolazone 5 mg 30-60 minutes PRIOR to Bumex.   potassium chloride (K-DUR) 10 mEq tablet Take six tablets by mouth three times daily.   rivaroxaban (XARELTO) 15 mg tablet Take one tablet by mouth daily with breakfast. Take with food.   rosuvastatin (CRESTOR) 5 mg tablet Take one tablet by mouth three times weekly.   tafamidis (VYNDAMAX) 61 mg capsule Take one capsule by mouth daily.       RECENT LABS:    Basic Metabolic Profile    Lab Results   Component Value Date/Time    NA 128 (L) 07/07/2021 08:14 AM    K 4.8 07/07/2021 08:14 AM    CA 9.5 07/07/2021 08:14 AM    CL 89 (L) 07/07/2021 08:14 AM    CO2 26 07/07/2021 08:14 AM    GAP 13 (H) 07/07/2021 08:14 AM    Lab Results   Component Value Date/Time    BUN 102 (H) 07/07/2021 08:14 AM    CR 3.49 (H) 07/07/2021 08:14 AM    GLU 148 (H) 07/07/2021 08:14 AM          UPCOMING APPOINTMENTS:    Future Appointments   Date Time Provider Department Center   08/20/2021 10:30 AM Fredricka Bonine, APRN-NP [facility] CVM Exam   08/20/2021 11:00 AM [place] [facility] CVM Procedur   09/24/2021  3:20 PM [name], MD [facility]

## 2021-08-08 NOTE — Telephone Encounter
Ray Running, APRN-NP  [name]    I think the readings are similar to the readings that he had when he saw Dr. Manuella Ghazi in February, I am glad he is not gaining weight or having any peripheral edema. I think we continue to monitor, it looks like he is got an appointment with me next on 5/23. DM

## 2021-08-12 ENCOUNTER — Encounter: Admit: 2021-08-12 | Discharge: 2021-08-12 | Payer: MEDICARE

## 2021-08-12 DIAGNOSIS — I5030 Unspecified diastolic (congestive) heart failure: Secondary | ICD-10-CM

## 2021-08-12 MED ORDER — JARDIANCE 10 MG PO TAB
ORAL_TABLET | 3 refills | Status: AC
Start: 2021-08-12 — End: ?

## 2021-08-13 ENCOUNTER — Encounter: Admit: 2021-08-13 | Discharge: 2021-08-13 | Payer: MEDICARE

## 2021-08-13 DIAGNOSIS — E8582 Wild-type transthyretin-related (ATTR) amyloidosis: Secondary | ICD-10-CM

## 2021-08-13 MED ORDER — VYNDAMAX 61 MG PO CAP
61 mg | ORAL_CAPSULE | Freq: Every day | ORAL | 11 refills | Status: CN
Start: 2021-08-13 — End: ?

## 2021-08-13 NOTE — Progress Notes
Pharmacy Medication Re-assessment    The patient's caregiver (wife) participated on the patient's behalf. All references to the patient herein were completed with the caregiver on the patient's behalf.    Indication/Regimen  The regimen of TAFAMIDIS 61 MG PO CAP indefinitely is appropriate for Ray [name] who has Wild-type transthyretin-related (ATTR) amyloidosis (HCC).    Renal dose adjustments are not required. Hepatic dose adjustments are not required. Dose titration is not required.    The patient has the ability to self-administer the medication(s).    Baseline Characteristics  Previous medications for amyloid: none  Allergy and/or intolerance to medications for amyloid: none    Therapeutic Goals and Monitoring  The goal of therapy is improvement in 6-minute walk distance.    6-minute walk distance has not improved but is stable.    The patient is making progress toward achieving their therapeutic goal. The plan is to continue current therapy.    Past Medical History and Comorbidities  Patient Active Problem List   Diagnosis   ? Essential hypertension   ? Hyperlipidemia   ? Skin cancer   ? Seasonal allergic reaction   ? Overweight (BMI 25.0-29.9)   ? Carotid bruit present   ? Systolic murmur of aorta   ? SOB (shortness of breath)   ? Longstanding persistent atrial fibrillation (HCC)   ? Heart palpitations   ? Medication side effects   ? NSVT (nonsustained ventricular tachycardia) (HCC)   ? Heart failure with preserved ejection fraction (HCC)   ? Bilateral leg edema   ? On amiodarone therapy   ? Junctional bradycardia   ? Chronic kidney disease, stage 4 (severe) (HCC)   ? Atrial fibrillation with RVR (HCC)   ? Hospitalization within last 30 days   ? Lightheadedness   ? S/P ablation of atrial fibrillation   ? Wild-type transthyretin-related (ATTR) amyloidosis (HCC)   ? Acute on chronic diastolic CHF (congestive heart failure), NYHA class 2 (HCC)   ? S/P left pulmonary artery pressure sensor implant placement   ? Chronic heart failure with preserved ejection fraction (HCC)   ? Asymptomatic microscopic hematuria   ? Mitral regurgitation   ? Nonrheumatic tricuspid valve regurgitation   ? Nonrheumatic aortic valve stenosis   ? S/P TAVR (transcatheter aortic valve replacement)   ? Hypokalemia   ? Heart failure (HCC)   ? AKI (acute kidney injury) (HCC)   ? Iron deficiency anemia     Additional comorbidities: no    Labs and Diagnostic Tests  6 Minute walk distance historical values 05/22/2021 02/13/2021 06/26/2020 08/16/2019   Distance Walked in Meters 259.25 305 152.5 289.75       Allergies  Allergies   Allergen Reactions   ? Beta Blockers [Beta-Blockers (Beta-Adrenergic Blocking Agts)] SHORTNESS OF BREATH and SEE COMMENTS     Pt had beta blocker toxicity from one dose of carvedilol        Immunizations  Vaccine history was reviewed with the patient. Education was provided on the importance of completing vaccines.    Immunization History   Administered Date(s) Administered   ? COVID-19 (MODERNA), mRNA vacc, 100 mcg/0.5 mL (PF) 04/21/2019, 05/24/2019, 01/31/2020   ? Flu Vaccine =>65 YO High-Dose Quadrivalent (PF) 01/27/2019   ? Pneumococcal Vaccine(13-Val Peds/immunocompromised adult) 02/26/2015       Medication Reconciliation  Medication history and reconciliation were performed (including prescription medications, supplements, over the counter, and herbal products). The medication list was updated and the patient's current medication list is included. The  patient was instructed to speak with their health care provider before starting any new drug, including prescription or over the counter, natural / herbal products, or vitamins.    Drug Interactions    Drug-Drug Interactions  Drug-drug interactions were evaluated. There were not clinically significant drug-drug interactions.     Drug-Food Interactions  Drug-food interactions were evaluated. There are not clinically significant drug-food interactions.    Vyndamax should be taken with or without food.    Home Medications    Medication Sig   acetaminophen (TYLENOL) 325 mg tablet Take two tablets by mouth every 6 hours as needed.   allopurinoL (ZYLOPRIM) 300 mg tablet Take one tablet by mouth daily. Take with food.   amiodarone (PACERONE) 400 mg tablet Take one tablet by mouth daily.   bumetanide (BUMEX) 2 mg tablet TAKE 3 TABLETS BY MOUTH TWICE DAILY   cholecalciferol (vitamin D3) (VITAMIN D3) 1,000 units tablet Take one tablet by mouth daily.   docusate (COLACE) 100 mg capsule Take one capsule by mouth twice daily.   ferrous sulfate (FEOSOL) 325 mg (65 mg iron) tablet Take one tablet by mouth twice daily. Take on an empty stomach at least 1 hour before or 2 hours after food.   fluorouraciL (EFUDEX) 5 % topical cream Apply to rough spots on hands and arms twice daily for 4 weeks   fluticasone/salmeterol (ADVAIR) 100/50 mcg inhalation disk Inhale one puff by mouth into the lungs daily.   JARDIANCE 10 mg tablet Take 1 tablet by mouth once daily   lactobacillus rhamnosus (GG) (CULTURELLE) 10 billion cell cap Take one capsule by mouth daily with breakfast.   magnesium oxide (MAG-OX) 400 mg (241.3 mg magnesium) tablet Take one tablet by mouth daily.   metOLazone (ZAROXOLYN) 5 mg tablet Take one tablet by mouth daily. 03/12/2021 Take Metolazone 5 mg 30-60 minutes PRIOR to Bumex.   omeprazole DR (PRILOSEC) 20 mg capsule Take one capsule by mouth daily before breakfast.   polyethylene glycol 3350 (MIRALAX) 17 g packet Take one packet by mouth as Needed.   potassium chloride (K-DUR) 10 mEq tablet Take six tablets by mouth three times daily.   rivaroxaban (XARELTO) 15 mg tablet Take one tablet by mouth daily with breakfast. Take with food.   rosuvastatin (CRESTOR) 5 mg tablet Take one tablet by mouth three times weekly.   senna (SENOKOT) 8.6 mg tablet Take two tablets by mouth twice daily as needed for Constipation.   sildenafiL (VIAGRA) 100 mg tablet TAKE 1 TABLET BY MOUTH ONCE DAILY AS NEEDED FOR SEXUAL ACTIVITY   tafamidis (VYNDAMAX) 61 mg capsule Take one capsule by mouth daily.   VENTOLIN HFA 90 mcg/actuation inhaler Inhale one puff by mouth into the lungs as Needed.     Adverse Drug Reactions  Adverse drug reactions were reviewed with the patient.    Significant adverse drug reaction(s) were not identified.    Side effect(s) were not reported.    Adherence  Refill and adherence history were reviewed with the patient. The patient was educated on the importance of adherence.    Patient is adherent with refills: yes  Patient is meeting refill adherence goal: yes    Patient reported 0 missed doses over the past 4 weeks.  Significance of missed doses: NA - no missed doses   Patient is meeting reported adherence goal.    Safety Precautions    Risk Evaluation and Mitigation (REMS) Assessment: REMS is not required for this medication.    Safety precautions were  addressed and discussed with the patient as applicable.    Contraindications: Ray [name] does not have contraindications to this medication.      Pregnancy Status: Male, education not applicable.    Medication Education  The patient was counseled via telephone.    Ray [name] was given the opportunity to ask questions but did not have any questions at the time. Patient was reminded of the refill process and encouraged to call with questions. The monitoring and follow-up plan was discussed with the patient. The patient was instructed to contact their health care provider if their symptoms or health problems do not get better or if they become worse. The patient should contact the specialty pharmacy at [phone] if they have any questions or concerns regarding their medication therapy. The patient verbalized acceptance and understanding.    Follow-up Plan  The patient will be reassessed within 1 year.    The medication(s) will be shipped from [facility].    Irineo Axon, PHARMD

## 2021-08-20 ENCOUNTER — Encounter: Admit: 2021-08-20 | Discharge: 2021-08-20 | Payer: MEDICARE

## 2021-08-20 ENCOUNTER — Ambulatory Visit: Admit: 2021-08-20 | Discharge: 2021-08-20 | Payer: MEDICARE

## 2021-08-20 DIAGNOSIS — I358 Other nonrheumatic aortic valve disorders: Secondary | ICD-10-CM

## 2021-08-20 DIAGNOSIS — C4490 Unspecified malignant neoplasm of skin, unspecified: Secondary | ICD-10-CM

## 2021-08-20 DIAGNOSIS — J302 Other seasonal allergic rhinitis: Secondary | ICD-10-CM

## 2021-08-20 DIAGNOSIS — I5032 Chronic diastolic (congestive) heart failure: Secondary | ICD-10-CM

## 2021-08-20 DIAGNOSIS — I5030 Unspecified diastolic (congestive) heart failure: Secondary | ICD-10-CM

## 2021-08-20 DIAGNOSIS — I4891 Unspecified atrial fibrillation: Secondary | ICD-10-CM

## 2021-08-20 DIAGNOSIS — I272 Pulmonary hypertension, unspecified: Secondary | ICD-10-CM

## 2021-08-20 DIAGNOSIS — I350 Nonrheumatic aortic (valve) stenosis: Secondary | ICD-10-CM

## 2021-08-20 DIAGNOSIS — I340 Nonrheumatic mitral (valve) insufficiency: Secondary | ICD-10-CM

## 2021-08-20 DIAGNOSIS — I10 Essential (primary) hypertension: Secondary | ICD-10-CM

## 2021-08-20 DIAGNOSIS — E859 Amyloidosis, unspecified: Secondary | ICD-10-CM

## 2021-08-20 DIAGNOSIS — J45909 Unspecified asthma, uncomplicated: Secondary | ICD-10-CM

## 2021-08-20 DIAGNOSIS — Z950 Presence of cardiac pacemaker: Secondary | ICD-10-CM

## 2021-08-20 DIAGNOSIS — E8582 Wild-type transthyretin-related (ATTR) amyloidosis: Secondary | ICD-10-CM

## 2021-08-20 DIAGNOSIS — I361 Nonrheumatic tricuspid (valve) insufficiency: Secondary | ICD-10-CM

## 2021-08-20 DIAGNOSIS — N189 Chronic kidney disease, unspecified: Secondary | ICD-10-CM

## 2021-08-20 DIAGNOSIS — E785 Hyperlipidemia, unspecified: Secondary | ICD-10-CM

## 2021-08-20 DIAGNOSIS — Z8679 Personal history of other diseases of the circulatory system: Secondary | ICD-10-CM

## 2021-08-20 DIAGNOSIS — U071 COVID-19: Secondary | ICD-10-CM

## 2021-08-20 LAB — CBC
MCHC: 34 g/dL (ref 32.0–36.0)
MPV: 8.9 FL (ref 7–11)
PLATELET COUNT: 172 K/UL (ref 150–400)
RBC COUNT: 3.3 M/UL — ABNORMAL LOW (ref 4.4–5.5)
RDW: 16 % — ABNORMAL HIGH (ref 11–15)
WBC COUNT: 6.3 K/UL — ABNORMAL HIGH (ref 4.5–11.0)

## 2021-08-20 LAB — MAGNESIUM: MAGNESIUM: 2.4 mg/dL — ABNORMAL LOW (ref 1.6–2.6)

## 2021-08-20 LAB — BASIC METABOLIC PANEL
CO2: 33 MMOL/L — ABNORMAL HIGH (ref 21–30)
POTASSIUM: 2.9 MMOL/L — ABNORMAL LOW (ref 3.5–5.1)
SODIUM: 134 MMOL/L — ABNORMAL LOW (ref 137–147)

## 2021-08-20 LAB — IRON + BINDING CAPACITY + %SAT+ FERRITIN
% SATURATION: 50 % — ABNORMAL HIGH (ref 28–42)
FERRITIN: 144 ng/mL — ABNORMAL LOW (ref >60)
IRON BINDING: 426 ug/dL — ABNORMAL HIGH (ref 270–380)
IRON: 211 ug/dL — ABNORMAL HIGH (ref 50–185)

## 2021-08-20 MED ORDER — POTASSIUM CHLORIDE 10 MEQ PO TBTQ
70 meq | ORAL_TABLET | Freq: Three times a day (TID) | ORAL | 3 refills | Status: CN
Start: 2021-08-20 — End: ?

## 2021-08-20 NOTE — Telephone Encounter
Geronimo Running, APRN-NP  P [facility] Nurse [facility]  [name]'s potassium is 2.9, his creatinine is actually improved, his hemoglobin is stable, iron studies look good. He is currently on 60 mEq potassium 3 times daily, unable to take spironolactone due to EGFR is 18. I think he needs to take an extra 10 mEq of potassium daily, needs repeat chemistry in 1 week, DC mag as his mag is 2.4. He is going to see nephrology again on 6/27. His CBC and iron studies are stable, would you let him know? Thanks, DM

## 2021-08-20 NOTE — Progress Notes
Daily CardioMEMS Readings    Goal PA Diastolic: 22 mmHg   PA Diastolic Thresholds: 41-74 mmHg  Notified [name], APRN

## 2021-08-20 NOTE — Patient Instructions
Thank you for coming to [facility].     Treatment Plan:    1.  Recommendations:   no changes in meds  BMP and mag  CBC and iron studies        2.  Next follow up appointment in 6 months with Dr. [name].        In order to provide you the best care possible we ask that you follow up as outlined below:     For NON-URGENT questions please contact us through your MyChart account.   For all medication refills please contact your pharmacy or send a request through MyChart.     For all questions that may need to be addressed urgently please call the nursing triage line at [phone] Monday - Friday 8 AM - 4 PM only. Please leave a detailed message with your name, date of birth, and reason for your call.  If calling outside business hours please call our on-call provider at [phone]    To schedule an appointment or to make changes to your appointments please call [phone].     Please allow 10 business days for the results of any testing to be reviewed. Please call our office if you have not heard from a nurse within this time frame.    At the [facility], you and your family are our top priority.  You may receive a survey via email or text message that we are asking you to complete.  We value your feedback to ensure you are satisfied with every visit and we are continually providing the highest quality of patient care.  We know your time is valuable and thank you in advance for completing the survey.    Vanetta Shawl, MD  [name] AGPCNP-BC, [facility]      Team [facility] Nurses:   Elayne Snare, Magdalen Spatz, Rogelia Rohrer Rene [facility] at [facility]  Nurse Triage Line: [phone] Fax: [phone] Scheduling: [phone] On-Call: [phone]      Heart Failure Zones-TUKH  Your heart failure symptom awareness and action plan.  Every Day Action Plan  Weigh yourself in the morning before breakfast. Write it down and compare it to yesterday's weight  Take your medicine, as prescribed.  Check for worsened swelling in your feet, ankles and stomach  Follow a 2000mg  salt diet.  Keep all healthcare appointments   GREEN ZONE:    Green Zone - Good! Symptoms are under control.  If all these statements are true, your symptoms are under control.  No shortness of breath   No increase in ankle swelling  No weight gain  No chest pain  No change in your usual activity  Continue to follow your everyday action plan.   YELLOW ZONE:    Yellow Zone - Call your doctor.  If you notice any of the following symptoms, call your healthcare provider.  Increased shortness of breath with activity  Weight gain of 3 pounds in one day or 5 pounds in a week  Increased swelling in your ankles or legs  Increased swelling in your stomach  Increasing fatigue  You may need an adjustment of your medications.   RED ZONE:     Red Zone - Call your doctor. Make an appointment.  If you have any of the following, call [facility] at [phone] or your cardiologist.  Shortness of breath at rest or waking up at  night feeling short of breath or coughing  Increased number of pillows used or needing to sit upright to sleep  Chest tightness at rest  Dizziness, lightheadedness or feeling faint You need to schedule an appointment  EMERGENCY ZONE:     Emergency Zone - Call 911 Now  Call 9-1-1 immediately if you have any of the following:  Worsening chest tightness or pain that is not relieved by medication  Severe shortness of breath and a cough with pink, frothy sputum        Daily Weight          For up to date information on the COVID-19 virus, visit the [facility] website.  General supportive care during cold and flu season and infection prevention reminders:   Wash hands often with soap and water for at least 20 seconds  Cover your mouth and nose  Stay home if sick and symptoms mild or manageable  If you must be around people wear a mask    If you are having symptoms of a lower respiratory infection (cough, shortness of breath) and/or fever AND either traveled in last 30 days (internationally or to region of exposure) OR known exposure to patient with COVID19:  Call your primary care provider for questions or health needs.   Tell your doctor about your recent travel and your symptoms  In a medical emergency, call 911 or go to the nearest emergency room.

## 2021-08-20 NOTE — Progress Notes
Date of Service: 08/20/2021    Ray [name] is a 86 y.o. male.          HPI      I had the pleasure of seeing Ray [name], who is accompanied by his wife Ray [name] today for amyloidosis follow-up.     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    He was last seen in clinic by Dr. [name] on February 22, at that OV there were no medication changes and continued to monitor his CardioMEMS readings.      Today [name] tells me that he had COVID in March, his wife was out of town, it sounds like he missed the therapeutic window for receiving Paxlovid.  Subsequently he had a fall on Easter Sunday, he was brought to [place] on April 9, it sounds like he tripped on the curb and landed on his face, he had no loss of consciousness, he had lacerations and abrasions to his face, hands and his left knee, he had a CT of his head.  Of note this was not his first fall.  He has been working with physical therapy and Occupational Therapy and tells me the biggest difficulty is moving from a low position to standing, otherwise he is walking without shortness of breath.  He notes home weight is between 155+/- 3 pounds, O2 saturation is good.  He denies any chest pain, palpitations, he does continue to have spontaneous bleeding of lower extremities that is very fine, he tells me that his skin is thin.  His CardioMEMS readings have been a bit higher, his brother-in-law was at the house, they both like to cook, [name] was smoking some bacon.  Of note his wife Ray [name] has just been recovering from hip replacement herself.         Vitals:    08/20/21 1032   BP: (!) 149/59   BP Source: Arm, Right Upper   Pulse: 79   SpO2: 98%   O2 Device: None (Room air)   PainSc: Zero   Weight: 72.6 kg (160 lb) Height: 170.2 cm (5' 7)     Body mass index is 25.06 kg/m?[name]     Past Medical History  Patient Active Problem List    Diagnosis Date Noted   ? 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  ?  01/26/2019 Echo EF:?60%  IVS 1.50 cm (Range: 0.6 - 1.0)         LV PW 1.40 cm (Range: 0.6 - 1.0)         ?  1. Moderate concentric left ventricular hypertrophy  2. Unable to assess diastolic function due to atrial fibrillation  3. Moderate biatrial dilatation  4. Normal right ventricular size and systolic function  5. Normal central venous pressure  6. Mild calcific aortic valve stenosis. ?Mean gradient is 16 mmHg with a peak velocity of 2.8 m/s. ?Trace aortic valve regurgitation is seen  7. Mitral valve is structurally okay. ?There is moderate central regurgitation. ?No stenosis  8. Moderate tricuspid valve regurgitation  9. Pulmonary artery static pressure measures at 33 mmHg   06/28/2019 TC PYP Multiview planar views and SPECT imaging confirms the presence of severe marked diffuse global left ventricular  and right ventricular myocardial uptake of tracer. ?There is normal rib and bone uptake.  ?  Two hour heart/contralateral lung (H/CL) ratio: ?1.977?this metric is abnormal and highly suggestive of ATTR cardiac amyloidosis.?  ?  Visual Semi-Quantitative Grading Scale Analysis:?  The study is grade 3 indicating myocardial uptake greater than rib and bone uptake. ?The pattern is highly and strongly suggestive of ATTR cardiac amyloidosis.  ?  SUMMARY/OPINION:   This study is abnormal and strongly suggestive of ATTR cardiac amyloidosis. ?Left ventricular myocardial uptake is diffuse, intense, and global. ?There is also prominent right ventricular free wall uptake of tracer. ?The pattern is typical for ATTR cardiac amyloidosis   ? Cardiac MRI ?   07/25/2019 Kappa/Lambda FLC ?  ? Ref. Range 07/25/2019 15:48   Kappa, FLC Latest Ref Range: 0.33 - 1.94 MG/DL 1.61 (H)   Lambda, FLC Latest Ref Range: 0.57 - 2.63 MG/DL 0.96 (H)   Kappa/Lambda FLC Latest Ref Range: 0.26 - 1.65  1.83 (H)      07/25/2019 Serum Immunofixation ?NO PARAPROTEIN SEEN   cancelled Urine Immunofixation ?Patient unable to provide urine sample 4/26    07/25/2019 Genetic Testing ?Negative   ? Biopsy (Fat Pad, Cardiac, Bone Marrow) ?   ? GI Symptoms ?   ?+? 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   ?  ? Ref. Range 07/25/2019 15:48   B Type Natriuretic Peptide Latest Ref Range: 0 - 100 PG/ML 966.0 (H)   Troponin-I Latest Ref Range: 0.0 - 0.05 NG/ML 0.05   ?  Completed:  -Tafamidis start 08/2019. PA approved, pt approved for [facility] patient assistance from 09/01/2019 to 03/30/2020  -Baseline KCCQ12 completed 5/18/202, Summary Score 61.46  -Baseline 6 min walk completed 08/16/2019, distance walked 950 feet, 289.75 meters, duration of test 6 minutes  -Baseline CPET completed 10/17/2019       ? COVID-19 08/20/2021     March 2023, did not receive treatment, outside window for treatment     ? Pulmonary hypertension (HCC) 08/20/2021   ? Iron deficiency anemia 11/27/2020   ? Heart failure (HCC) 11/26/2020   ? AKI (acute kidney injury) (HCC) 11/26/2020   ? S/P TAVR (transcatheter aortic valve replacement) 10/25/2020   ? Hypokalemia 10/25/2020   ? Mitral regurgitation 09/06/2020   ? Nonrheumatic tricuspid valve regurgitation 09/06/2020   ? Nonrheumatic aortic valve stenosis 09/06/2020   ? Asymptomatic microscopic hematuria 12/25/2019   ? S/P left pulmonary artery pressure sensor implant placement 12/15/2019     *Patient has CardioMEMS (Pulmonary Artery Pressure Sensor)* Please call [name], CardioMEMs Program Coordinator [phone] or page Heart Failure Rounding Team if patient is admitted or presents to ED*          ? Chronic heart failure with preserved ejection fraction (HCC) 12/15/2019   ? Acute on chronic diastolic CHF (congestive heart failure), NYHA class 2 (HCC) 11/14/2019   ? S/P ablation of atrial fibrillation 05/18/2018   ? Lightheadedness 03/16/2018   ? Hospitalization within last 30 days 12/24/2017   ? Atrial fibrillation with RVR (HCC) 12/14/2017   ? Chronic kidney disease, stage 4 (severe) (HCC) 12/13/2017   ? Junctional bradycardia 12/12/2017   ? Bilateral leg edema 12/11/2017   ? On amiodarone therapy 12/11/2017   ? Heart failure with preserved ejection fraction (HCC) 01/01/2016   ?  NSVT (nonsustained ventricular tachycardia) (HCC) 06/12/2015   ? Medication side effects 11/09/2014   ? Heart palpitations 09/19/2014   ? Longstanding persistent atrial fibrillation (HCC) 03/17/2013     03/26/2018 - ECHO:  Moderate concentric LVH seen. The EF is about 55%.  The atria are slightly dilated.  The RV function is mildly decreased.  Mild to moderate MR and TR seen.  Mild AI is present.  Based on the gradients and appearance, the AS appears to be mild. The mean gradient is 16 mm Hg with a peak velocity of 2.6 m/s.  03/26/2018 - Zio Patch:  Predominant rhythm was normal sinus.  Two runs of ventricular tachycardia occurred, they were monomorphic, the longest run lasted 3 seconds, this episode occurred on April 08, 2018 at 9:41 PM.  One episode of AIVR also appeared to be present, it lasted approximately 4.6 seconds  Brief and rare episodes of SVT that probably represent atrial fibrillation, the longest lasting 6.3 seconds.  No evidence of atrial flutter and no evidence of high degree AV block.  05/12/2018 - LAAA:  Persistent Atrial Fibrillation status post successful pulmonary vein antral isolation.  Cavotricuspid Isthmus Ablation.  Ablation of Fractionated Intracardiac Electrograms.  Ablation for an Additional Discrete Arrhythmia-LA AFL after AFIB/PVI Ablation  06/23/2018 - Cardioversion: Successful direct current cardioversion from symptomatic atrial fibrillation to sinus rhythm.  03/16/2019 - DCCV:  Successful direct current cardioversion from atrial fibrillation to sinus rhythm.     ? Systolic murmur of aorta 02/04/2013   ? SOB (shortness of breath) 02/04/2013   ? Carotid bruit present 01/23/2012   ? Overweight (BMI 25.0-29.9) 01/30/2011   ? Essential hypertension 01/30/2009   ? Hyperlipidemia 01/30/2009     Hyperlipidemia, on statin therapy     ? Skin cancer 01/30/2009   ? Seasonal allergic reaction 01/30/2009         Review of Systems   Constitutional: Negative.   HENT: Negative.    Eyes: Negative.    Cardiovascular: Negative.    Respiratory: Negative.    Endocrine: Negative.    Hematologic/Lymphatic: Negative.    Skin: Negative.    Musculoskeletal: Negative.    Gastrointestinal: Positive for constipation.   Genitourinary: Negative.    Neurological: Negative.    Psychiatric/Behavioral: Negative.    Allergic/Immunologic: Negative.        Physical Exam   General Appearance: In NAD  Neck Veins: 7 JVP, neck veins are slightly distended; positive HJR   Chest Inspection: chest is normal in appearance   Respiratory Effort: breathing comfortably, no respiratory distress   Auscultation/Percussion: lungs clear to auscultation, no rales, rhonchi or wheezing   Cardiac Rhythm: regular rhythm and normal rate   Cardiac Auscultation: S1, S2 normal, no rub, no definite S3  or S4   Murmurs: no murmur   Peripheral Circulation: normal peripheral circulation   Pedal Pulses: normal symmetric pedal pulses   Lower Extremity Edema: no lower extremity edema, venous discoloration  Abdominal Exam: protruberantm non-tender, no obvious masses, bowel sounds normal   Gait & Station: walks without assistance   Orientation: oriented to person, place and time   Affect & Mood: appropriate and sustained affect   Language and Memory: patient responsive and seems to comprehend information   Neurologic Exam: neurological assessment grossly intact   Vital signs were reviewed  Multiple well-healed abrasions, previous Mohs procedure site      Cardiovascular Studies:    ? Echo 12/07/2020 the left ventricular systolic function is normal. The visually estimated ejection  fraction is 55%.  ? The RV is moderately dilated with reduced systolic function. Pacemaker lead present in the ventricle.  ? Moderate biatrial dilatation.  ? Mitral annular calcification, no stenosis, severe mitral valve regurgitation.  ? Severe tricuspid valve regurgitation.  ? There is a well seated, normal functioning TAVR #26 Sapien S3 Ultra. Normal MG~11 mmHg,no paravalvular/transvalvular regurgitation.  ? Pulmonary hypertension, estimated PAP 46 mmHg.   Compared to the previous study dated 10/25/2020: The tricuspid valve regurgitation has progressed to severe, the pulmonary artery pressure previously was 32 mmHg.  PYP 06/04/2021 SCINTIGRAPIC (Planar/SPECT):  Multiview planar and SPECT imaging also suggest moderately intense uptake of tracer in the left ventricular myocardium diffusely.  SPECT images confirm the presence of myocardial uptake particularly in the sagittal and coronal tomograms.?  Three hour heart/contralateral lung (H/CL) ratio:  1.55.  This metric is abnormal and strongly suggestive of ATTR cardiac amyloidosis.?  Visual Semi-Quantitative Grading Scale Analysis:?  By semiquantitative visual grading the study is grade 2 with rib and bone uptake equal to or greater than myocardial uptake of tracer.?  SUMMARY/OPINION:   This study is abnormal and highly suggestive of ATTR cardiac amyloidosis.  Nevertheless, compared to the patient's previous technetium amyloid scans the heart contralateral lung ratio has declined significantly as well as the visual semiquantitative grading scale.?  This study does not exclude AL Amyloidosis. Serum free light chain and serum and urine immunofixation is recommended in all patients undergoing 36mTc-PYP scans for cardiac amyloidosis.      6 Minute Walk Last Results 05/22/2021 02/13/2021 06/26/2020 08/16/2019   SpO2:  Pre-Activity 98 96 99 99   Pulse:  POST-Activity - 89 94 122   BP:  POST-Activity - 150/62 126/76 151/76   SpO2:  POST-Activity - 97 99 96   Distance Walked in Meters 259.25 305 152.5 289.75       Problems Addressed Today  Encounter Diagnoses   Name Primary?   ? Aortic stenosis, mild Yes       Assessment and Plan   Wild-type ATTR amyloidosis:   -He has had a previous technetium pyrophosphate scan on 06/28/2019 that showed a 2-hour heart/contralateral lung ratio of 1.97 with visual semiquantitative grading grade 3, abnormal and highly suggestive of ATTR cardiac amyloidosis.   - He has had kappa 7.45, lambda 4.07 and free light chain ratio 1.83 on 07/25/2019  -He had serum electrophoresis that showed no paraprotein.   -See 6-minute walk noted above  -He has completed a KCC Q score that was 72.92 indicative of fair to good quality of life.    -He is on tafamidis 61 mg daily.    -He had a baseline CPET on 7/19 2021,, his repeat cardiopulmonary exercise test on 11/11 showed moderately reduced aerobic capacity 54% of predicted VO2 maximum association with abnormally early onset of enterobiasis with anaerobic threshold 41% of VO2 max.  Very similar to his previous CPET in 2021.  -He had repeat TC PYP on 06/11/2020, it showed 2-hour heart/contralateral lung ratio reduced to 1.7, visual semiquantitative grading scale remains at 3.  Subsequently he had another repeat PYP on 3/7 that showed 2-1/2-hour heart/contralateral lung ratio of 1.5, visual grade 2, I reviewed the good progression of his amyloidosis testing results.     Heart failure with preserved ejection fraction:  He is currently on Bumex 6 mg twice daily and metolazone 5 mg daily.[name]  His CardioMEMS reading today is 25 threshold is 20-24,  CardioMEMS in situ: -12/15/19 IMPLANT RHC:?  BP:?126/76 (99)  RA:?27  RV:?60/32  PA:?57/31 (42)  PCWP:?30  TPG:?12  PVR:?3.8  SVR:?1840.  CO (Fick):?3.1  CI (Fick):?1.7?  AVO2 Diff 7.18?  Daily CardioMEMS Readings  ?Goal PA Diastolic:  22  PA Diastolic Thresholds:  20-24    Pulmonary hypertension: He underwent  RHC on 11/26/20  showed RA 20, RV 67/20, PA 67/25 with a mean of 39, wedge was 20, cardiac output by thermodilution was 4.4 with index 1.9.      Severe aortic stenosis status post TAVR with Dr. Paris Lore in [place] on 7/27 2022:   - He had CT chest and abdomen with contrast on 7/1 that showed thickening and calcification of the aortic valve with a normal caliber ascending thoracic aorta, several sub-5 mm nodules scattered throughout both lungs, cardiomegaly, coronary artery disease and pulmonary hypertension.    -He had postprocedure echo on 7/28 that showed prosthetic valve was normal, no stenosis or regurgitation.     Severe mitral regurgitation that is noted on most recent echo.    CKD stage 4:   Follows with Dr. Mercie Eon who will see him next June 27.  Repeat chemistry today     Atrial fibrillation: He continues to take amiodarone 200 mg p.o.daily, he is on oral anticoagulation with Xarelto 15 mg daily, he denies any upper or lower GI bleeding symptoms.  He has been in atrial fibrillation since at least 2020.       Permanent pacemaker in situ: He underwent permanent pacemaker insertion with Dr. Milas Kocher, most recent device evaluation scheduled this morning.    Plan: We will check CBC and iron studies to assess iron deficiency anemia, repeat BMP and mag level, if mag level is within normal limits I will discontinue his magnesium.  Follow-up with Dr. [name] in December.    I have personally documented the HPI, exam and medical decision making.  Patient education: I reviewed recent lab results and current medications, medication instructions, discussed heart failure signs & symptoms,  low  sodium diet, fluid restriction and daily weights.I have instructed the patient on the plan of care and they verbalize understanding of the plan. Please see AVS for full patient teaching. Patient advised to call our office if s/he has any problems, questions, worsening symptoms, or concerns prior to the next appointment.   Thanks for allowing me to see this nice patient. If I can be of additional assistance, please don't hesitate to contact me.     [name] AGPCNP-BC, [facility]  Pager [phone]  Collaborating physician: Dr. Vanetta Shawl                      Current Medications (including today's revisions)  ? acetaminophen (TYLENOL) 325 mg tablet Take two tablets by mouth every 6 hours as needed.   ? allopurinoL (ZYLOPRIM) 300 mg tablet Take one tablet by mouth daily. Take with food.   ? amiodarone (PACERONE) 400 mg tablet Take one tablet by mouth daily.   ? bumetanide (BUMEX) 2 mg tablet TAKE 3 TABLETS BY MOUTH TWICE DAILY   ? cholecalciferol (vitamin D3) (VITAMIN D3) 1,000 units tablet Take one tablet by mouth daily.   ? docusate (COLACE) 100 mg capsule Take one capsule by mouth twice daily.   ? ferrous sulfate (FEOSOL) 325 mg (65 mg iron) tablet Take one tablet by mouth twice daily. Take on an empty stomach at least 1 hour before or 2 hours after food.   ? fluorouraciL (EFUDEX) 5 % topical cream Apply to rough spots  on hands and arms twice daily for 4 weeks   ? fluticasone/salmeterol (ADVAIR) 100/50 mcg inhalation disk Inhale one puff by mouth into the lungs daily.   ? JARDIANCE 10 mg tablet Take 1 tablet by mouth once daily   ? lactobacillus rhamnosus (GG) (CULTURELLE) 10 billion cell cap Take one capsule by mouth daily with breakfast.   ? magnesium oxide (MAG-OX) 400 mg (241.3 mg magnesium) tablet Take one tablet by mouth daily.   ? metOLazone (ZAROXOLYN) 5 mg tablet Take one tablet by mouth daily. 03/12/2021 Take Metolazone 5 mg 30-60 minutes PRIOR to Bumex.   ? omeprazole DR (PRILOSEC) 20 mg capsule Take one capsule by mouth daily before breakfast.   ? polyethylene glycol 3350 (MIRALAX) 17 g packet Take one packet by mouth as Needed.   ? potassium chloride (K-DUR) 10 mEq tablet Take six tablets by mouth three times daily.   ? rivaroxaban (XARELTO) 15 mg tablet Take one tablet by mouth daily with breakfast. Take with food.   ? rosuvastatin (CRESTOR) 5 mg tablet Take one tablet by mouth three times weekly.   ? senna (SENOKOT) 8.6 mg tablet Take two tablets by mouth twice daily as needed for Constipation.   ? sildenafiL (VIAGRA) 100 mg tablet TAKE 1 TABLET BY MOUTH ONCE DAILY AS NEEDED FOR SEXUAL ACTIVITY   ? tafamidis (VYNDAMAX) 61 mg capsule Take one capsule by mouth daily.   ? VENTOLIN HFA 90 mcg/actuation inhaler Inhale one puff by mouth into the lungs as Needed.

## 2021-08-21 ENCOUNTER — Encounter: Admit: 2021-08-21 | Discharge: 2021-08-21 | Payer: MEDICARE

## 2021-08-22 ENCOUNTER — Encounter: Admit: 2021-08-22 | Discharge: 2021-08-22 | Payer: MEDICARE

## 2021-08-22 DIAGNOSIS — I5032 Chronic diastolic (congestive) heart failure: Secondary | ICD-10-CM

## 2021-08-22 NOTE — Progress Notes
Amiodarone Monitoring 08/22/21:     [facility] Protocol:   Due at baseline and every 6 months:  Labs: ALT, AST, TSH.     Due at baseline and every 12 months: K, Mg, Office Visit, ECG(can use V rate for HR), Chest Imaging (CXR, PFT or high resolution CT), BP and an eye exam.     Amiodarone status:  Amiodarone testing needed: TSH with Free T4 Reflex and follow-up in 120 days.    Most recent test results (every 6 months: AST, ALT, TSH w Free T4 within normal limits. Every 12 months: Potassium, Magnesium)  Lab Results   Component Value Date/Time    AST 56 (H) 07/07/2021 08:14 AM    ALT 33 07/07/2021 08:14 AM    TSH 2.53 09/21/2019 12:04 PM    FREET4R 1.08 05/28/2015 12:00 AM    K 2.9 (L) 08/20/2021 11:42 AM    MG 2.4 08/20/2021 11:42 AM       BP:   BP Readings from Last 1 Encounters:   08/20/21 (!) 149/59       ECG:   Most recent results for 12-Lead ECG   ECG 12-LEAD    Collection Time: 12/07/20  3:55 PM   Result Value Status    VENTRICULAR RATE 70 Final    P-R INTERVAL 314 Final    QRS DURATION 96 Final    Q-T INTERVAL 488 Final    QTC CALCULATION (BAZETT) 527 Final    P AXIS 0 Final    R AXIS 151 Final    T AXIS -67 Final    Impression    Atrial-paced rhythm with prolonged AV conduction  Septal infarct , age undetermined  Abnormal ECG  Confirmed by Samantha Crimes ([id number]) on 12/22/2020 4:15:03 PM       CXR, PFT or high resolution CT: 11/30/2020    Last Cardiology OV - 08/20/2021    Eye exam - unknown     Provider notified of abnormal results? Not Applicable    These criteria follow national best practices as defined by [facility] Protocol.

## 2021-08-23 ENCOUNTER — Encounter: Admit: 2021-08-23 | Discharge: 2021-08-23 | Payer: MEDICARE

## 2021-08-23 MED FILL — POTASSIUM CHLORIDE 10 MEQ PO TBTQ: 10 mEq | ORAL | 30 days supply | Status: CN

## 2021-08-24 ENCOUNTER — Encounter: Admit: 2021-08-24 | Discharge: 2021-08-24 | Payer: MEDICARE

## 2021-08-26 ENCOUNTER — Encounter: Admit: 2021-08-26 | Discharge: 2021-08-26 | Payer: MEDICARE

## 2021-08-27 ENCOUNTER — Encounter: Admit: 2021-08-27 | Discharge: 2021-08-27 | Payer: MEDICARE

## 2021-08-30 ENCOUNTER — Encounter: Admit: 2021-08-30 | Discharge: 2021-08-30 | Payer: MEDICARE

## 2021-08-30 MED FILL — VYNDAMAX 61 MG PO CAP: 61 mg | ORAL | 30 days supply | Qty: 30 | Fill #1 | Status: AC

## 2021-09-02 ENCOUNTER — Encounter: Admit: 2021-09-02 | Discharge: 2021-09-02 | Payer: MEDICARE

## 2021-09-03 ENCOUNTER — Encounter: Admit: 2021-09-03 | Discharge: 2021-09-03 | Payer: MEDICARE

## 2021-09-05 ENCOUNTER — Encounter: Admit: 2021-09-05 | Discharge: 2021-09-05 | Payer: MEDICARE

## 2021-09-05 DIAGNOSIS — I5032 Chronic diastolic (congestive) heart failure: Secondary | ICD-10-CM

## 2021-09-12 ENCOUNTER — Encounter: Admit: 2021-09-12 | Discharge: 2021-09-12 | Payer: MEDICARE

## 2021-09-12 NOTE — Progress Notes
On MyChart medication refill questionnaire patient reported unplanned healthcare visit: Per patients Mychart questionnaire he went to the ED due to a cut on his leg    The refill was processed. Ambulatory pharmacist notified.    Ray [name]  [phone]

## 2021-09-13 ENCOUNTER — Encounter: Admit: 2021-09-13 | Discharge: 2021-09-13 | Payer: MEDICARE

## 2021-09-16 ENCOUNTER — Encounter: Admit: 2021-09-16 | Discharge: 2021-09-16 | Payer: MEDICARE

## 2021-09-16 MED ORDER — METOLAZONE 5 MG PO TAB
ORAL_TABLET | 0 refills
Start: 2021-09-16 — End: ?

## 2021-09-17 ENCOUNTER — Encounter: Admit: 2021-09-17 | Discharge: 2021-09-17 | Payer: MEDICARE

## 2021-09-19 ENCOUNTER — Encounter: Admit: 2021-09-19 | Discharge: 2021-09-19 | Payer: MEDICARE

## 2021-09-19 DIAGNOSIS — I5030 Unspecified diastolic (congestive) heart failure: Secondary | ICD-10-CM

## 2021-09-19 LAB — BASIC METABOLIC PANEL
ANION GAP: 14
BLD UREA NITROGEN: 105 — ABNORMAL HIGH (ref 8.4–25.7)
CALCIUM: 9.6
CHLORIDE: 95 — ABNORMAL LOW (ref 98–107)
CO2: 27
CREATININE: 3.2 — ABNORMAL HIGH (ref 0.72–1.25)
GFR ESTIMATED: 19
GLUCOSE,PANEL: 143 — ABNORMAL HIGH (ref 70–105)
POTASSIUM: 3.4 — ABNORMAL LOW (ref 3.5–5.1)
SODIUM: 136

## 2021-09-20 ENCOUNTER — Encounter: Admit: 2021-09-20 | Discharge: 2021-09-20 | Payer: MEDICARE

## 2021-09-23 ENCOUNTER — Encounter: Admit: 2021-09-23 | Discharge: 2021-09-23 | Payer: MEDICARE

## 2021-09-24 ENCOUNTER — Encounter: Admit: 2021-09-24 | Discharge: 2021-09-24 | Payer: MEDICARE

## 2021-09-24 ENCOUNTER — Ambulatory Visit: Admit: 2021-09-24 | Discharge: 2021-09-25 | Payer: MEDICARE

## 2021-09-24 DIAGNOSIS — I5032 Chronic diastolic (congestive) heart failure: Secondary | ICD-10-CM

## 2021-09-24 DIAGNOSIS — I340 Nonrheumatic mitral (valve) insufficiency: Secondary | ICD-10-CM

## 2021-09-24 DIAGNOSIS — I361 Nonrheumatic tricuspid (valve) insufficiency: Secondary | ICD-10-CM

## 2021-09-24 DIAGNOSIS — Z8679 Personal history of other diseases of the circulatory system: Secondary | ICD-10-CM

## 2021-09-24 DIAGNOSIS — J302 Other seasonal allergic rhinitis: Secondary | ICD-10-CM

## 2021-09-24 DIAGNOSIS — J45909 Unspecified asthma, uncomplicated: Secondary | ICD-10-CM

## 2021-09-24 DIAGNOSIS — C4490 Unspecified malignant neoplasm of skin, unspecified: Secondary | ICD-10-CM

## 2021-09-24 DIAGNOSIS — E8582 Wild-type transthyretin-related (ATTR) amyloidosis: Secondary | ICD-10-CM

## 2021-09-24 DIAGNOSIS — I350 Nonrheumatic aortic (valve) stenosis: Secondary | ICD-10-CM

## 2021-09-24 DIAGNOSIS — E859 Amyloidosis, unspecified: Secondary | ICD-10-CM

## 2021-09-24 DIAGNOSIS — E785 Hyperlipidemia, unspecified: Secondary | ICD-10-CM

## 2021-09-24 DIAGNOSIS — I10 Essential (primary) hypertension: Secondary | ICD-10-CM

## 2021-09-24 DIAGNOSIS — I358 Other nonrheumatic aortic valve disorders: Secondary | ICD-10-CM

## 2021-09-24 DIAGNOSIS — N189 Chronic kidney disease, unspecified: Secondary | ICD-10-CM

## 2021-09-24 DIAGNOSIS — Z952 Presence of prosthetic heart valve: Secondary | ICD-10-CM

## 2021-09-24 LAB — PROTEIN/CR RATIO,UR RAN
UR CREATININE, RAN: 22 mg/dL
UR TOTAL PROTEIN,RAN: 9 mg/dL

## 2021-09-24 MED FILL — VYNDAMAX 61 MG PO CAP: 61 mg | ORAL | 30 days supply | Qty: 30 | Fill #2 | Status: AC

## 2021-09-24 NOTE — Progress Notes
History     CC: ckd    HPI: Ray [name] is a 86 y.o. male presents to clinic for follow up of his CKD.    He is on the tafamydis for the amyloid and is doing better. He fell a couple of times and needed stiches  He and his wife live in [place]. They did not want to visit Ray Coke, APRN for dialysis education. They are not sure if he will do it.  His legs have had blisters lately           Medication    HOME MEDS  ? acetaminophen (TYLENOL) 325 mg tablet Take two tablets by mouth every 6 hours as needed.   ? allopurinoL (ZYLOPRIM) 300 mg tablet Take one tablet by mouth daily. Take with food.   ? amiodarone (PACERONE) 400 mg tablet Take one tablet by mouth daily.   ? bumetanide (BUMEX) 2 mg tablet TAKE 3 TABLETS BY MOUTH TWICE DAILY   ? cholecalciferol (vitamin D3) (VITAMIN D3) 1,000 units tablet Take one tablet by mouth daily.   ? docusate (COLACE) 100 mg capsule Take one capsule by mouth twice daily as needed.   ? ferrous sulfate (FEOSOL) 325 mg (65 mg iron) tablet Take one tablet by mouth daily. Take on an empty stomach at least 1 hour before or 2 hours after food.   ? fluorouraciL (EFUDEX) 5 % topical cream Apply to rough spots on hands and arms twice daily for 4 weeks (Patient taking differently: Apply to rough spots on hands and arms twice daily as needed for 4 weeks)   ? fluticasone/salmeterol (ADVAIR) 100/50 mcg inhalation disk Inhale one puff by mouth into the lungs daily.   ? JARDIANCE 10 mg tablet Take 1 tablet by mouth once daily   ? lactobacillus rhamnosus (GG) (CULTURELLE) 10 billion cell cap Take one capsule by mouth daily with breakfast.   ? metOLazone (ZAROXOLYN) 5 mg tablet Take one tablet by mouth daily. Take 30-60 minutes PRIOR to Bumex.   ? omeprazole DR (PRILOSEC) 20 mg capsule Take one capsule by mouth daily before breakfast.   ? polyethylene glycol 3350 (MIRALAX) 17 g packet Take one packet by mouth as Needed.   ? potassium chloride (K-DUR) 10 mEq tablet Take seven tablets by mouth every morning AND six tablets daily after lunch AND six tablets every evening.   ? rivaroxaban (XARELTO) 15 mg tablet Take one tablet by mouth daily with breakfast. Take with food.   ? rosuvastatin (CRESTOR) 5 mg tablet Take one tablet by mouth three times weekly.   ? senna (SENOKOT) 8.6 mg tablet Take two tablets by mouth twice daily as needed for Constipation.   ? sildenafiL (VIAGRA) 100 mg tablet TAKE 1 TABLET BY MOUTH ONCE DAILY AS NEEDED FOR SEXUAL ACTIVITY   ? tafamidis (VYNDAMAX) 61 mg capsule Take one capsule by mouth daily.   ? VENTOLIN HFA 90 mcg/actuation inhaler Inhale one puff by mouth into the lungs as Needed.          Review of Systems  Constitutional: negative  Eyes: negative  Ears, nose, mouth, throat, and face: negative  Respiratory: negative  Cardiovascular: negative  Gastrointestinal: negative  Genitourinary:negative  Integument/breast: negative  Hematologic/lymphatic: negative  Musculoskeletal:leg swelling  Neurological: negative  Endocrine: negative      Physical Exam   Vitals:    09/24/21 1516 09/24/21 1527   BP: 125/48 124/48   BP Source: Arm, Left Upper Arm, Left Upper   Pulse: 60  67   Temp: Comment: afebrile per pt.    PainSc: Four    Weight: (P) 73.3 kg (161 lb 9.6 oz)    Height: 167.6 cm (5' 6)      Body mass index is 26.08 kg/m? (pended).    Gen: Alert and oriented   HEENT: Sclera normal   CV: no JVD, S1 and S2 normal, no rubs, murmurs or gallops   Pulm: Clear to auscultation bilateral   GI : non-distended, non-tender to palpation  Neuro: Grossly normal, moving all extremities, speech intact  Ext: 2+ edema  Skin: no rashes       Assessment and Plan      Ray [name] is a 86 y.o. male who follows in clinic for CKD         Chronic kidney disease  -serum creatinine has ranged from 2.3mg /dl to ?2.23mg /dl last 2 years.  -has 2+ proteinuria with 2grams protein, now with pr/cr ratio of 0.5  -renal ultrasound from 11/2017 with no structural abnormalities  -CKD is likely from HTN but?did?paraproteinemia work up?as he has cardiac dysfunction and it was normal.  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  -we discussed the natural course of CKD  -discussed the need to avoid NSAIDS, contrast studies  -no dietary restrictions from a kidney standpoint; he was asked to follow a low salt diet  ?-no proteinuria on urinalysis in clinic  -most recent BMP from 09/19/21 with BUN of 105 mg/dl and creatinine of  3.27mg /dl   -he feels well and has no symptoms of uremia  -discussed with him that he may need dialysis for fluid overload when he develops diuretic resistance  ?--we discussed transplant and dialysis at length  -we discussed the process of getting on the transplant list  -we discussed the usual barriers to transplant  -We discussed the usual indications of dialysis  -we discussed peritoneal and home hemodialysis and in center hemodialysis  ?-they inquired about home dialysis, there is ongoing hesitation about dialysis  -I do think medical management is better than dialysis in his case. Discussed that with him and wife  ?  ?  Heart failure with preserved ejection fraction  -s/p cardiomems 12/15/19  -on bumex and metolazone    ?  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  HTN  -controlled    Hypokalemia  -on supplementation  ?  Atrial Fibrillation-considering watchman procedure    Hyperlipidemia  ?  ?Hematuria  -was noted in 2019 as well  -he is on xarelto  -no hematuria on repeat urinalysis in 11/2019  -seen by urology  ?  Total time today 45 minutes in the following activities:   ? Preparing to see the patient  ? Obtaining and/or reviewing separately obtained history  ? Performing a medically appropriate examination and/or evaluation  ? Counseling and educating the patient/family/caregiver  ? Ordering medications, tests, or procedures  ? Referring and communication with other health care professionals (when not separately reported) ? Documenting clinical information in the electronic or other health record    RTC 6 months  ?  Ray Chamber, MD   Pager [phone]

## 2021-09-25 ENCOUNTER — Encounter: Admit: 2021-09-25 | Discharge: 2021-09-25 | Payer: MEDICARE

## 2021-09-25 DIAGNOSIS — N184 Chronic kidney disease, stage 4 (severe): Secondary | ICD-10-CM

## 2021-10-06 ENCOUNTER — Encounter: Admit: 2021-10-06 | Discharge: 2021-10-06 | Payer: MEDICARE

## 2021-10-06 MED ORDER — AMIODARONE 400 MG PO TAB
ORAL_TABLET | 0 refills
Start: 2021-10-06 — End: ?

## 2021-10-07 ENCOUNTER — Encounter: Admit: 2021-10-07 | Discharge: 2021-10-07 | Payer: MEDICARE

## 2021-10-07 DIAGNOSIS — Z952 Presence of prosthetic heart valve: Secondary | ICD-10-CM

## 2021-10-07 DIAGNOSIS — I10 Essential (primary) hypertension: Secondary | ICD-10-CM

## 2021-10-07 DIAGNOSIS — I5032 Chronic diastolic (congestive) heart failure: Secondary | ICD-10-CM

## 2021-10-07 LAB — BASIC METABOLIC PANEL
ANION GAP: 11
BLD UREA NITROGEN: 101 — ABNORMAL HIGH (ref 8.4–25.7)
CALCIUM: 9.8
CHLORIDE: 94 — ABNORMAL LOW (ref 98–107)
CO2: 30
CREATININE: 3.3 — ABNORMAL HIGH (ref 0.72–1.25)
GFR ESTIMATED: 18 — ABNORMAL LOW (ref >59)
GLUCOSE,PANEL: 132 — ABNORMAL HIGH (ref 70–105)
POTASSIUM: 3.4 — ABNORMAL LOW (ref 3.5–5.1)
SODIUM: 135 — ABNORMAL LOW (ref 136–145)

## 2021-10-07 NOTE — Progress Notes
To: [facility]   Fax: [phone]      RE: Ray [name]  DOB: 1935/10/21  MRN: [id number]    Medical Records request for continuity of care.  Ray [name] is scheduled for an appointment on TBD with [name], APRN in the Advanced Heart Failure clinic at [place] [facility].    Please fax records to [phone]   [facility] [place] [place] [facility]  [address] [name], [place] [address]  Ph. [phone] Fax. [phone]    Requested Records:   o Recent Labs within month of July 2023          CONFIDENTIALITY NOTICE  This facsimile transmission contains confidential information, some or all of which may be protected health information as defined by the [place] (HIPAA) Privacy Rule.  The information contained in this facsimile is intended only for the exclusive and confidential use of the designated recipient named above.  If you are not the designated recipient (or an employee or agent responsible for delivering this facsimile transmission to the designated recipient), you are hereby notified that any disclosure, dissemination, distribution or copying of this information is strictly prohibited and may be subject to legal restriction or sanction. If you have received this transmission in error, immediately notify the Privacy Officer at [phone] to arrange for the return or destruction of the information and all copies.    Ray [name], Ray [name] - Phone [phone] - Fax [phone] - [email or web address]

## 2021-10-08 ENCOUNTER — Encounter: Admit: 2021-10-08 | Discharge: 2021-10-08 | Payer: MEDICARE

## 2021-10-08 DIAGNOSIS — I5030 Unspecified diastolic (congestive) heart failure: Secondary | ICD-10-CM

## 2021-10-08 MED ORDER — POTASSIUM CHLORIDE 10 MEQ PO TBTQ
ORAL_TABLET | ORAL | 3 refills | 30.00000 days | Status: AC
Start: 2021-10-08 — End: ?

## 2021-10-08 NOTE — Telephone Encounter
Per provider Pt instructed to increase Potassium by 10 mEq daily and recheck labs in 1 week. Pt has standing order for BMP available at his preferred lab. PAD thresholds increased to 20-25 mmHg per Provider. Will continue to monitor CardioMEMS Readings

## 2021-10-08 NOTE — Telephone Encounter
Per Provider request, placed PC to pt for symptom check. Spoke to patient after obtaining 2 patient identifiers, full legal name and DOB. Pt reports he is feeling "pretty [place]", mowing and doing gardening, canning and fishing. He states his weight is 154 lbs this morning and ranges between 154-155 lbs. He denies increase in SOA or DOE. No PND or Orthopnea. BP ranging 119-130/60-70, Pulse 62 most days, O2 sat 96 %. Pedal edema in evenings bilaterally but improves and is "nearly gone in mornings".  He does report he was sitting in lawn chair by pond 7/6 and the legs of it sunk in mud and toppled over backwards bumping his head. He denies any need for ER. He reports he has "small bruising on neck/back of head with small bump which is improving every day".  He states he is not concerned about it at this time as it seems to be getting better and no other symptoms. He and his wife verbalize they are aware to seek medical attn if anything changes. Pt taking medications as listed on MAR. He denies any concerns at this time to report to Provider. He will let us know if he has any concerns, Weight gain, increased SOA or edema.

## 2021-10-17 ENCOUNTER — Encounter: Admit: 2021-10-17 | Discharge: 2021-10-17 | Payer: MEDICARE

## 2021-10-17 NOTE — Progress Notes
Please fax lab results from 10/17/21 to ATTN: [facility] Nurse Team @ [phone]. Thank you.

## 2021-10-18 ENCOUNTER — Encounter: Admit: 2021-10-18 | Discharge: 2021-10-18 | Payer: MEDICARE

## 2021-10-18 DIAGNOSIS — I5030 Unspecified diastolic (congestive) heart failure: Secondary | ICD-10-CM

## 2021-10-18 MED ORDER — POTASSIUM CHLORIDE 10 MEQ PO TBTQ
70 meq | ORAL_TABLET | Freq: Three times a day (TID) | ORAL | 11 refills | 30.00000 days | Status: AC
Start: 2021-10-18 — End: ?

## 2021-10-18 MED FILL — VYNDAMAX 61 MG PO CAP: 61 mg | ORAL | 30 days supply | Qty: 30 | Fill #3 | Status: AC

## 2021-10-21 ENCOUNTER — Encounter: Admit: 2021-10-21 | Discharge: 2021-10-21 | Payer: MEDICARE

## 2021-10-21 NOTE — Telephone Encounter
Prior authorization request received through CoverMyMeds for potassium 70 mEq 3 times daily. Urgent PA submitted. Determination pending.

## 2021-10-21 NOTE — Telephone Encounter
Call placed to [employer] and informed of approval. Claim was processed and paid with $4 co-pay.MyChart message sent to patient to inform of approval and co-pay.  Attach copy of the approval letter for his records.  Requested call back if any questions.

## 2021-10-24 ENCOUNTER — Encounter: Admit: 2021-10-24 | Discharge: 2021-10-24 | Payer: MEDICARE

## 2021-10-28 ENCOUNTER — Encounter: Admit: 2021-10-28 | Discharge: 2021-10-28 | Payer: MEDICARE

## 2021-10-28 NOTE — Progress Notes
[place], Please fax results of BMP drawn today to ATTN: [facility] Nurse at [phone]     Thank you!

## 2021-10-30 ENCOUNTER — Encounter: Admit: 2021-10-30 | Discharge: 2021-10-30 | Payer: MEDICARE

## 2021-11-07 ENCOUNTER — Encounter: Admit: 2021-11-07 | Discharge: 2021-11-07 | Payer: MEDICARE

## 2021-11-07 DIAGNOSIS — I5032 Chronic diastolic (congestive) heart failure: Secondary | ICD-10-CM

## 2021-11-11 ENCOUNTER — Encounter: Admit: 2021-11-11 | Discharge: 2021-11-11 | Payer: MEDICARE

## 2021-11-11 DIAGNOSIS — E785 Hyperlipidemia, unspecified: Secondary | ICD-10-CM

## 2021-11-11 MED ORDER — ROSUVASTATIN 5 MG PO TAB
5 mg | ORAL_TABLET | ORAL | 0 refills | 90.00000 days | Status: AC
Start: 2021-11-11 — End: ?

## 2021-11-11 NOTE — Telephone Encounter
Geronimo Running, APRN-NP  to [name]       11/11/21 2:56 PM   Looks like his mems readings are good. I am glad that he did not have any issues traveling in the past. I would definitely recommend that he has trip insurance in case he would need to cancel at the last minute. Thanks, DM   Lequita Halt, RN  to Geronimo Running, APRN-NP  Me    SG   11/11/21 2:50 PM   Hi, Pt has been mostly within his PA Diastolic thresholds.   Manuela Schwartz     Pt's PAD Thresholds are 20-25 mmHg   11-11-2021, 08:16 AM 24 mmHg   11-10-2021, 09:18 AM  24 mmHg   11-09-2021, 08:54 AM 23 mmHg   11-08-2021, 10:22 AM 24 mmHg   11-07-2021, 07:14 AM 23 mmHg   11-06-2021, 01:35 PM 22 mmHg   11-05-2021, 07:59 AM 23 mmHg   11-04-2021, 07:01 AM 28 mmHg   11-03-2021, 09:51 AM 22 mmHg   11-02-2021, 08:19 AM 20 mmHg   11-01-2021, 09:56 AM 24 mmHg   10-31-2021, 09:00 AM 23 mmHg   10-30-2021, 04:59 PM 22 mmHg   10-29-2021, 08:51 AM 24 mmHg   Geronimo Running, APRN-NP  to Me  [name]       11/11/21 2:39 PM  I would want to know how his mems is doing.

## 2021-11-12 ENCOUNTER — Encounter: Admit: 2021-11-12 | Discharge: 2021-11-12 | Payer: MEDICARE

## 2021-11-13 ENCOUNTER — Encounter: Admit: 2021-11-13 | Discharge: 2021-11-13 | Payer: MEDICARE

## 2021-11-14 ENCOUNTER — Encounter: Admit: 2021-11-14 | Discharge: 2021-11-14 | Payer: MEDICARE

## 2021-11-15 ENCOUNTER — Encounter: Admit: 2021-11-15 | Discharge: 2021-11-15 | Payer: MEDICARE

## 2021-11-15 DIAGNOSIS — E785 Hyperlipidemia, unspecified: Secondary | ICD-10-CM

## 2021-11-15 LAB — LIPID PROFILE
CHOLESTEROL/HDL %: 3
CHOLESTEROL: 142
HDL: 54
TRIGLYCERIDES: 76
VLDL: 15

## 2021-11-15 NOTE — Progress Notes
[facility]   Fax: [phone]      RE: Ray [name]  DOB: April 21, 1935  MRN: [id number]    Medical Records request for continuity of care.  Caprice Red is scheduled for an appointment on TBD with Dr. Aida Puffer in the [place] Failure clinic at [place] [facility].    Please fax records to [phone]   [facility] [place] [facility]  [address] [place], [place] [address]  Ph. [phone] Fax. [phone]    Requested Records:   o Recent Labs within the last 1 mo            CONFIDENTIALITY NOTICE  This facsimile transmission contains confidential information, some or all of which may be protected health information as defined by the [place] (HIPAA) Privacy Rule.  The information contained in this facsimile is intended only for the exclusive and confidential use of the designated recipient named above.  If you are not the designated recipient (or an employee or agent responsible for delivering this facsimile transmission to the designated recipient), you are hereby notified that any disclosure, dissemination, distribution or copying of this information is strictly prohibited and may be subject to legal restriction or sanction. If you have received this transmission in error, immediately notify the Privacy Officer at [phone] to arrange for the return or destruction of the information and all copies.    [place], [place] - Phone [phone] - Fax [phone] - [email or web address]

## 2021-11-26 ENCOUNTER — Encounter: Admit: 2021-11-26 | Discharge: 2021-11-26 | Payer: MEDICARE

## 2021-11-26 MED FILL — VYNDAMAX 61 MG PO CAP: 61 mg | ORAL | 30 days supply | Qty: 30 | Fill #4 | Status: AC

## 2021-11-27 ENCOUNTER — Encounter: Admit: 2021-11-27 | Discharge: 2021-11-27 | Payer: MEDICARE

## 2021-11-27 DIAGNOSIS — I5032 Chronic diastolic (congestive) heart failure: Secondary | ICD-10-CM

## 2021-11-27 NOTE — Progress Notes
Amiodarone Monitoring 11/27/21:     [facility] Protocol:   Due at baseline and every 6 months:  Labs: ALT, AST, TSH.     Due at baseline and every 12 months: K, Mg, Office Visit, ECG(can use V rate for HR), Chest Imaging (CXR, PFT or high resolution CT), BP and an eye exam.     Amiodarone status:  Amiodarone testing needed: CMP, AST, ALT, CXR, ECG and Eye Exam and follow-up in 30 days.    Most recent test results (every 6 months: AST, ALT, TSH w Free T4 within normal limits. Every 12 months: Potassium, Magnesium)  Lab Results   Component Value Date/Time    AST 56 (H) 07/07/2021 08:14 AM    ALT 33 07/07/2021 08:14 AM    TSH 2.53 09/21/2019 12:04 PM    FREET4R 1.08 05/28/2015 12:00 AM    K 4.2 10/28/2021 12:00 AM    MG 2.4 08/20/2021 11:42 AM       BP:   BP Readings from Last 1 Encounters:   09/24/21 124/48       ECG:   Most recent results for 12-Lead ECG   ECG 12-LEAD    Collection Time: 12/07/20  3:55 PM   Result Value Status    VENTRICULAR RATE 70 Final    P-R INTERVAL 314 Final    QRS DURATION 96 Final    Q-T INTERVAL 488 Final    QTC CALCULATION (BAZETT) 527 Final    P AXIS 0 Final    R AXIS 151 Final    T AXIS -67 Final    Impression    Atrial-paced rhythm with prolonged AV conduction  Septal infarct , age undetermined  Abnormal ECG  Confirmed by Samantha Crimes ([id number]) on 12/22/2020 4:15:03 PM     *Note: Due to a large number of results and/or encounters for the requested time period, some results have not been displayed. A complete set of results can be found in Results Review.       CXR, PFT or high resolution CT: 11/30/2020    Last Cardiology OV - 08/20/21    Eye exam - unknown     Provider notified of abnormal results? Yes    These criteria follow national best practices as defined by [facility] Protocol.

## 2021-11-27 NOTE — Progress Notes
Received notification that  [employer] Carelink has not been connected since 11/11/21. Patient was instructed to look at his/her transmitter to make sure that it is plugged into power and send a manual transmission to reconnect the transmitter. If he/she has any questions about how to send a transmission or if the transmitter does not appear to be working properly, they need to contact the device company directly. Patient was provided with that contact number. Requested the patient send [place] a MyChart message or contact our device nurses at [phone] to let us know after they have sent their transmission. Mychart sent to pt. BC      Note: Patient needs to send a manual remote interrogation to reestablish communication to his/her remote transmitter.

## 2021-12-04 ENCOUNTER — Encounter: Admit: 2021-12-04 | Discharge: 2021-12-04 | Payer: MEDICARE

## 2021-12-04 MED ORDER — ROSUVASTATIN 5 MG PO TAB
5 mg | ORAL_TABLET | ORAL | 3 refills | 90.00000 days | Status: AC
Start: 2021-12-04 — End: ?

## 2021-12-05 ENCOUNTER — Inpatient Hospital Stay: Payer: MEDICARE

## 2021-12-05 ENCOUNTER — Encounter: Admit: 2021-12-05 | Discharge: 2021-12-05 | Payer: MEDICARE

## 2021-12-05 DIAGNOSIS — Z950 Presence of cardiac pacemaker: Secondary | ICD-10-CM

## 2021-12-06 ENCOUNTER — Encounter: Admit: 2021-12-06 | Discharge: 2021-12-06 | Payer: MEDICARE

## 2021-12-06 DIAGNOSIS — E859 Amyloidosis, unspecified: Secondary | ICD-10-CM

## 2021-12-06 DIAGNOSIS — J302 Other seasonal allergic rhinitis: Secondary | ICD-10-CM

## 2021-12-06 DIAGNOSIS — I10 Essential (primary) hypertension: Secondary | ICD-10-CM

## 2021-12-06 DIAGNOSIS — I358 Other nonrheumatic aortic valve disorders: Secondary | ICD-10-CM

## 2021-12-06 DIAGNOSIS — E785 Hyperlipidemia, unspecified: Secondary | ICD-10-CM

## 2021-12-06 DIAGNOSIS — Z8679 Personal history of other diseases of the circulatory system: Secondary | ICD-10-CM

## 2021-12-06 DIAGNOSIS — I361 Nonrheumatic tricuspid (valve) insufficiency: Secondary | ICD-10-CM

## 2021-12-06 DIAGNOSIS — N189 Chronic kidney disease, unspecified: Secondary | ICD-10-CM

## 2021-12-06 DIAGNOSIS — C4490 Unspecified malignant neoplasm of skin, unspecified: Secondary | ICD-10-CM

## 2021-12-06 DIAGNOSIS — I340 Nonrheumatic mitral (valve) insufficiency: Secondary | ICD-10-CM

## 2021-12-06 DIAGNOSIS — J45909 Unspecified asthma, uncomplicated: Secondary | ICD-10-CM

## 2021-12-06 DIAGNOSIS — I350 Nonrheumatic aortic (valve) stenosis: Secondary | ICD-10-CM

## 2021-12-06 MED ADMIN — SODIUM CHLORIDE 0.9 % IV SOLP [27838]: 250 mL | INTRAVENOUS | @ 08:00:00 | Stop: 2021-12-06 | NDC 00338004902

## 2021-12-06 MED ADMIN — POTASSIUM CHLORIDE IN WATER 10 MEQ/50 ML IV PGBK [11075]: 10 meq | INTRAVENOUS | @ 14:00:00 | Stop: 2021-12-06 | NDC 00338070541

## 2021-12-06 MED ADMIN — POTASSIUM CHLORIDE IN WATER 10 MEQ/50 ML IV PGBK [11075]: 10 meq | INTRAVENOUS | @ 19:00:00 | Stop: 2021-12-06 | NDC 00338070541

## 2021-12-06 MED ADMIN — ROSUVASTATIN 10 MG PO TAB [88503]: 5 mg | ORAL | @ 14:00:00 | NDC 00904677961

## 2021-12-06 MED ADMIN — POTASSIUM CHLORIDE 20 MEQ PO TBTQ [35943]: 40 meq | ORAL | @ 23:00:00 | Stop: 2021-12-06 | NDC 00832532511

## 2021-12-06 MED ADMIN — POTASSIUM CHLORIDE IN WATER 10 MEQ/50 ML IV PGBK [11075]: 10 meq | INTRAVENOUS | @ 12:00:00 | Stop: 2021-12-06 | NDC 00338070541

## 2021-12-06 MED ADMIN — SODIUM CHLORIDE 0.9 % IV SOLP [27838]: 1000.000 mL | INTRAVENOUS | @ 20:00:00 | NDC 00338004904

## 2021-12-06 MED ADMIN — RIVAROXABAN 15 MG PO TAB [309660]: 15 mg | ORAL | @ 14:00:00 | Stop: 2021-12-06 | NDC 50458057801

## 2021-12-06 MED ADMIN — POTASSIUM CHLORIDE IN WATER 10 MEQ/50 ML IV PGBK [11075]: 10 meq | INTRAVENOUS | @ 10:00:00 | Stop: 2021-12-06 | NDC 00338070541

## 2021-12-06 MED ADMIN — POTASSIUM CHLORIDE IN WATER 10 MEQ/50 ML IV PGBK [11075]: 10 meq | INTRAVENOUS | @ 16:00:00 | Stop: 2021-12-06 | NDC 00338070541

## 2021-12-06 MED ADMIN — POTASSIUM CHLORIDE IN WATER 10 MEQ/50 ML IV PGBK [11075]: 10 meq | INTRAVENOUS | @ 08:00:00 | Stop: 2021-12-06 | NDC 00338070541

## 2021-12-06 MED ADMIN — ALLOPURINOL 300 MG PO TAB [311]: 300 mg | ORAL | @ 14:00:00 | NDC 55111073001

## 2021-12-06 MED ADMIN — AMIODARONE 200 MG PO TAB [9066]: 400 mg | ORAL | @ 14:00:00 | Stop: 2021-12-06 | NDC 00904699361

## 2021-12-06 MED ADMIN — BUDESONIDE-FORMOTEROL 80-4.5 MCG/ACTUATION IN HFAA [163005]: 2 | RESPIRATORY_TRACT | @ 13:00:00 | NDC 00186037228

## 2021-12-06 MED ADMIN — LACTATED RINGERS IV SOLP [4318]: 1000.000 mL | INTRAVENOUS | @ 09:00:00 | Stop: 2021-12-06 | NDC 00338011704

## 2021-12-06 MED ADMIN — POTASSIUM CHLORIDE 20 MEQ PO TBTQ [35943]: 60 meq | ORAL | @ 08:00:00 | Stop: 2021-12-06 | NDC 00832532511

## 2021-12-06 MED ADMIN — POTASSIUM CHLORIDE 20 MEQ PO TBTQ [35943]: 40 meq | ORAL | @ 10:00:00 | Stop: 2021-12-06 | NDC 00832532511

## 2021-12-06 MED ADMIN — PANTOPRAZOLE 40 MG PO TBEC [80436]: 40 mg | ORAL | @ 12:00:00 | NDC 00904647461

## 2021-12-07 MED ADMIN — AMIODARONE 200 MG PO TAB [9066]: 200 mg | ORAL | @ 14:00:00 | NDC 00904699361

## 2021-12-07 MED ADMIN — ALLOPURINOL 300 MG PO TAB [311]: 300 mg | ORAL | @ 14:00:00 | NDC 55111073001

## 2021-12-07 MED ADMIN — POTASSIUM CHLORIDE IN WATER 10 MEQ/50 ML IV PGBK [11075]: 10 meq | INTRAVENOUS | @ 08:00:00 | Stop: 2021-12-07 | NDC 00338070541

## 2021-12-07 MED ADMIN — APIXABAN 2.5 MG PO TAB [315777]: 2.5 mg | ORAL | @ 14:00:00 | NDC 00003089331

## 2021-12-07 MED ADMIN — POTASSIUM CHLORIDE IN WATER 10 MEQ/50 ML IV PGBK [11075]: 10 meq | INTRAVENOUS | @ 12:00:00 | Stop: 2021-12-07 | NDC 00338070541

## 2021-12-07 MED ADMIN — PANTOPRAZOLE 40 MG PO TBEC [80436]: 40 mg | ORAL | @ 12:00:00 | NDC 00904647461

## 2021-12-07 MED ADMIN — POTASSIUM CHLORIDE IN WATER 10 MEQ/50 ML IV PGBK [11075]: 10 meq | INTRAVENOUS | @ 10:00:00 | Stop: 2021-12-07 | NDC 00338070541

## 2021-12-07 MED ADMIN — SODIUM CHLORIDE 0.9 % IV SOLP [27838]: 1000.000 mL | INTRAVENOUS | @ 08:00:00 | Stop: 2021-12-07 | NDC 00338004904

## 2021-12-07 MED ADMIN — POTASSIUM CHLORIDE IN WATER 10 MEQ/50 ML IV PGBK [11075]: 10 meq | INTRAVENOUS | @ 14:00:00 | Stop: 2021-12-07 | NDC 00338070541

## 2021-12-08 ENCOUNTER — Inpatient Hospital Stay: Admit: 2021-12-08 | Discharge: 2021-12-08 | Payer: MEDICARE

## 2021-12-08 ENCOUNTER — Encounter: Admit: 2021-12-08 | Discharge: 2021-12-08 | Payer: MEDICARE

## 2021-12-08 MED ADMIN — ALLOPURINOL 300 MG PO TAB [311]: 300 mg | ORAL | @ 14:00:00 | NDC 55111073001

## 2021-12-08 MED ADMIN — PANTOPRAZOLE 40 MG PO TBEC [80436]: 40 mg | ORAL | @ 14:00:00 | NDC 00904647461

## 2021-12-08 MED ADMIN — ACETAMINOPHEN 325 MG PO TAB [101]: 650 mg | ORAL | @ 01:00:00 | NDC 00904677361

## 2021-12-08 MED ADMIN — POTASSIUM CHLORIDE 20 MEQ/15 ML PO LIQD [6432]: 10 meq | ORAL | @ 01:00:00 | Stop: 2021-12-08 | NDC 00121189630

## 2021-12-08 MED ADMIN — APIXABAN 2.5 MG PO TAB [315777]: 2.5 mg | ORAL | @ 14:00:00 | NDC 00003089331

## 2021-12-08 MED ADMIN — APIXABAN 2.5 MG PO TAB [315777]: 2.5 mg | ORAL | @ 01:00:00 | NDC 00003089331

## 2021-12-08 MED ADMIN — AMIODARONE 200 MG PO TAB [9066]: 200 mg | ORAL | @ 14:00:00 | NDC 00904699361

## 2021-12-08 MED ADMIN — POTASSIUM CHLORIDE 20 MEQ/15 ML PO LIQD [6432]: 60 meq | ORAL | @ 14:00:00 | Stop: 2021-12-08 | NDC 00121189630

## 2021-12-09 ENCOUNTER — Encounter: Admit: 2021-12-09 | Discharge: 2021-12-09 | Payer: MEDICARE

## 2021-12-09 ENCOUNTER — Inpatient Hospital Stay: Admit: 2021-12-09 | Discharge: 2021-12-09 | Payer: MEDICARE

## 2021-12-09 ENCOUNTER — Ambulatory Visit: Admit: 2021-12-09 | Discharge: 2021-12-09 | Payer: MEDICARE

## 2021-12-09 DIAGNOSIS — I4729 NSVT (nonsustained ventricular tachycardia) (HCC): Secondary | ICD-10-CM

## 2021-12-09 DIAGNOSIS — Z9889 Other specified postprocedural states: Secondary | ICD-10-CM

## 2021-12-09 MED ADMIN — SODIUM CHLORIDE 0.9 % IV PGBK (MB+) [95161]: 4.5 g | INTRAVENOUS | @ 04:00:00 | NDC 00338915930

## 2021-12-09 MED ADMIN — SODIUM CHLORIDE 0.9 % IV PGBK (MB+) [95161]: 4.5 g | INTRAVENOUS | @ 16:00:00 | Stop: 2021-12-09 | NDC 00338915930

## 2021-12-09 MED ADMIN — IOHEXOL 350 MG IODINE/ML IV SOLN [81210]: 60 mL | INTRAVENOUS | @ 05:00:00 | Stop: 2021-12-09 | NDC 00407141491

## 2021-12-09 MED ADMIN — PIPERACILLIN-TAZOBACTAM 4.5 GRAM IV SOLR [80419]: 4.5 g | INTRAVENOUS | @ 16:00:00 | Stop: 2021-12-09 | NDC 60505615900

## 2021-12-09 MED ADMIN — PIPERACILLIN-TAZOBACTAM 4.5 GRAM IV SOLR [80419]: 4.5 g | INTRAVENOUS | @ 04:00:00 | NDC 60505615900

## 2021-12-09 MED ADMIN — ROSUVASTATIN 10 MG PO TAB [88503]: 5 mg | ORAL | @ 13:00:00 | NDC 00904677961

## 2021-12-09 MED ADMIN — SODIUM CHLORIDE 0.9 % IV SOLP [27838]: 300 mg | INTRAVENOUS | @ 23:00:00 | NDC 00338004931

## 2021-12-09 MED ADMIN — FUROSEMIDE 10 MG/ML IJ SOLN [3291]: 60 mg | INTRAVENOUS | @ 04:00:00 | Stop: 2021-12-09 | NDC 63323028001

## 2021-12-09 MED ADMIN — PANTOPRAZOLE 40 MG PO TBEC [80436]: 40 mg | ORAL | @ 13:00:00 | NDC 00904647461

## 2021-12-09 MED ADMIN — BUMETANIDE 1 MG PO TAB [9310]: 1 mg | ORAL | @ 16:00:00 | Stop: 2021-12-09 | NDC 00904701606

## 2021-12-09 MED ADMIN — APIXABAN 2.5 MG PO TAB [315777]: 2.5 mg | ORAL | @ 13:00:00 | NDC 00003089331

## 2021-12-09 MED ADMIN — CEFTAROLINE 600 MG IV SOLR [305299]: 300 mg | INTRAVENOUS | @ 23:00:00 | NDC 00456060001

## 2021-12-09 MED ADMIN — BUMETANIDE 1 MG PO TAB [9310]: 1 mg | ORAL | @ 23:00:00 | Stop: 2021-12-09 | NDC 00904701606

## 2021-12-09 MED ADMIN — AMIODARONE 200 MG PO TAB [9066]: 200 mg | ORAL | @ 13:00:00 | NDC 00904699361

## 2021-12-09 MED ADMIN — HYDROXYZINE HCL 10 MG/5 ML PO SOLN [3771]: 10 mg | ORAL | @ 03:00:00 | Stop: 2021-12-09 | NDC 54838050280

## 2021-12-09 MED ADMIN — ACETAMINOPHEN 1,000 MG/100 ML (10 MG/ML) IV SOLN [305632]: 1000 mg | INTRAVENOUS | @ 04:00:00 | Stop: 2021-12-09 | NDC 24201010001

## 2021-12-09 MED ADMIN — SODIUM CHLORIDE 0.9 % IJ SOLN [7319]: 50 mL | INTRAVENOUS | @ 05:00:00 | Stop: 2021-12-09 | NDC 00409488820

## 2021-12-09 MED ADMIN — APIXABAN 2.5 MG PO TAB [315777]: 2.5 mg | ORAL | @ 02:00:00 | NDC 00003089331

## 2021-12-09 MED ADMIN — DAPTOMYCIN SYR [212028]: 650 mg | INTRAVENOUS | @ 20:00:00 | NDC 63323087115

## 2021-12-09 MED ADMIN — ALLOPURINOL 300 MG PO TAB [311]: 300 mg | ORAL | @ 13:00:00 | NDC 55111073001

## 2021-12-10 ENCOUNTER — Inpatient Hospital Stay: Admit: 2021-12-10 | Discharge: 2021-12-10 | Payer: MEDICARE

## 2021-12-10 MED ADMIN — PANTOPRAZOLE 40 MG PO TBEC [80436]: 40 mg | ORAL | @ 12:00:00 | NDC 00904647461

## 2021-12-10 MED ADMIN — SODIUM CHLORIDE 0.9 % IV SOLP [27838]: 500 mL | INTRAVENOUS | @ 21:00:00 | Stop: 2021-12-10 | NDC 00338004903

## 2021-12-10 MED ADMIN — SODIUM CHLORIDE 0.9 % IV SOLP [27838]: 200 mg | INTRAVENOUS | @ 21:00:00 | NDC 00338004931

## 2021-12-10 MED ADMIN — CEFTAROLINE 600 MG IV SOLR [305299]: 200 mg | INTRAVENOUS | @ 21:00:00 | NDC 00456060001

## 2021-12-10 MED ADMIN — AMIODARONE 200 MG PO TAB [9066]: 200 mg | ORAL | @ 14:00:00 | NDC 00904699361

## 2021-12-10 MED ADMIN — SODIUM CHLORIDE 0.9 % IV SOLP [27838]: 500 mL | INTRAVENOUS | @ 22:00:00 | Stop: 2021-12-10 | NDC 00338004904

## 2021-12-10 MED ADMIN — CEFTAROLINE 600 MG IV SOLR [305299]: 300 mg | INTRAVENOUS | @ 14:00:00 | Stop: 2021-12-10 | NDC 00456060001

## 2021-12-10 MED ADMIN — APIXABAN 2.5 MG PO TAB [315777]: 2.5 mg | ORAL | @ 01:00:00 | NDC 00003089331

## 2021-12-10 MED ADMIN — MIDODRINE 5 MG PO TAB [10610]: 10 mg | ORAL | Stop: 2021-12-11 | NDC 00904681861

## 2021-12-10 MED ADMIN — ALLOPURINOL 300 MG PO TAB [311]: 300 mg | ORAL | @ 14:00:00 | Stop: 2021-12-11 | NDC 55111073001

## 2021-12-10 MED ADMIN — APIXABAN 2.5 MG PO TAB [315777]: 2.5 mg | ORAL | @ 14:00:00 | NDC 00003089331

## 2021-12-10 MED ADMIN — BUMETANIDE 2 MG PO TAB [9311]: 2 mg | ORAL | @ 14:00:00 | NDC 50268013211

## 2021-12-10 MED ADMIN — CEFTAROLINE 600 MG IV SOLR [305299]: 300 mg | INTRAVENOUS | @ 05:00:00 | NDC 00456060001

## 2021-12-10 MED ADMIN — SODIUM CHLORIDE 0.9 % IV SOLP [27838]: 300 mg | INTRAVENOUS | @ 14:00:00 | Stop: 2021-12-10 | NDC 00338004931

## 2021-12-10 MED ADMIN — SODIUM CHLORIDE 0.9 % IV SOLP [27838]: 300 mg | INTRAVENOUS | @ 05:00:00 | NDC 00338004931

## 2021-12-11 ENCOUNTER — Encounter: Admit: 2021-12-11 | Discharge: 2021-12-10 | Payer: MEDICARE

## 2021-12-11 ENCOUNTER — Inpatient Hospital Stay: Admit: 2021-12-11 | Discharge: 2021-12-11 | Payer: MEDICARE

## 2021-12-11 MED ADMIN — NOREPINEPHRINE BITARTRATE-NACL 4 MG/250 ML (16 MCG/ML) IV SOLN [173409]: 0.01 ug/kg/min | INTRAVENOUS | @ 03:00:00 | NDC 44567064001

## 2021-12-11 MED ADMIN — SODIUM CHLORIDE 0.9 % IV SOLP [27838]: 200 mg | INTRAVENOUS | @ 14:00:00 | Stop: 2021-12-11 | NDC 00338004931

## 2021-12-11 MED ADMIN — CEFTAROLINE 600 MG IV SOLR [305299]: 200 mg | INTRAVENOUS | @ 14:00:00 | Stop: 2021-12-11 | NDC 00456060001

## 2021-12-11 MED ADMIN — AMIODARONE 200 MG PO TAB [9066]: 200 mg | ORAL | @ 14:00:00 | Stop: 2021-12-11 | NDC 00904699361

## 2021-12-11 MED ADMIN — HYDROMORPHONE (PF) 2 MG/ML IJ SYRG [163476]: 1 mg | INTRAVENOUS | @ 21:00:00 | Stop: 2021-12-11 | NDC 00409131203

## 2021-12-11 MED ADMIN — CEFTAROLINE 600 MG IV SOLR [305299]: 200 mg | INTRAVENOUS | @ 05:00:00 | Stop: 2021-12-11 | NDC 00456060001

## 2021-12-11 MED ADMIN — DAPTOMYCIN SYR [212028]: 650 mg | INTRAVENOUS | @ 18:00:00 | Stop: 2021-12-11 | NDC 63323087115

## 2021-12-11 MED ADMIN — INSULIN ASPART 100 UNIT/ML SC FLEXPEN [87504]: 1 [IU] | SUBCUTANEOUS | NDC 00169633910

## 2021-12-11 MED ADMIN — APIXABAN 5 MG PO TAB [315778]: 2.5 mg | ORAL | @ 01:00:00 | NDC 00003089431

## 2021-12-11 MED ADMIN — APIXABAN 5 MG PO TAB [315778]: 2.5 mg | ORAL | @ 14:00:00 | Stop: 2021-12-11 | NDC 00003089431

## 2021-12-11 MED ADMIN — PANTOPRAZOLE 40 MG PO TBEC [80436]: 40 mg | ORAL | @ 12:00:00 | Stop: 2021-12-11 | NDC 00904647461

## 2021-12-11 MED ADMIN — SODIUM CHLORIDE 0.9 % IV SOLP [27838]: 200 mg | INTRAVENOUS | @ 05:00:00 | Stop: 2021-12-11 | NDC 00338004931

## 2021-12-12 MED ADMIN — LORAZEPAM 2 MG/ML IJ SYRG [86485]: 1 mg | INTRAVENOUS | @ 12:00:00 | Stop: 2021-12-13 | NDC 00409198503

## 2021-12-12 MED ADMIN — HYDROMORPHONE (PF) 2 MG/ML IJ SYRG [163476]: 1 mg | INTRAVENOUS | @ 17:00:00 | Stop: 2021-12-13 | NDC 00409131203

## 2021-12-12 MED ADMIN — HYDROMORPHONE (PF) 2 MG/ML IJ SYRG [163476]: 0.5 mg | INTRAVENOUS | @ 15:00:00 | Stop: 2021-12-13 | NDC 00409131203

## 2021-12-12 MED ADMIN — LORAZEPAM 2 MG/ML IJ SYRG [86485]: 1 mg | INTRAVENOUS | @ 13:00:00 | Stop: 2021-12-13 | NDC 00409198503

## 2021-12-12 MED ADMIN — LORAZEPAM 2 MG/ML IJ SYRG [86485]: 1 mg | INTRAVENOUS | @ 15:00:00 | Stop: 2021-12-13 | NDC 00409198503

## 2021-12-12 MED ADMIN — LORAZEPAM 2 MG/ML IJ SYRG [86485]: 1 mg | INTRAVENOUS | @ 17:00:00 | Stop: 2021-12-13 | NDC 00409198503

## 2021-12-12 MED ADMIN — LORAZEPAM 2 MG/ML IJ SYRG [86485]: 1 mg | INTRAVENOUS | @ 06:00:00 | Stop: 2021-12-13 | NDC 00409198503

## 2021-12-16 ENCOUNTER — Encounter: Admit: 2021-12-16 | Discharge: 2021-12-16 | Payer: MEDICARE

## 2021-12-17 ENCOUNTER — Encounter: Admit: 2021-12-17 | Discharge: 2021-12-17 | Payer: MEDICARE

## 2021-12-17 NOTE — Telephone Encounter
Returned patient's wife's earlier call, provided her with Engineer, site Support number at [phone]. Informed Helene Kelp to contact us if she has any further questions.

## 2021-12-17 NOTE — Telephone Encounter
pts spouse called from preferred number asking what she needs to do with pts cardiomems home monitor.

## 2021-12-24 ENCOUNTER — Encounter: Admit: 2021-12-24 | Discharge: 2021-12-24 | Payer: MEDICARE

## 2021-12-25 ENCOUNTER — Encounter: Admit: 2021-12-25 | Discharge: 2021-12-25 | Payer: MEDICARE

## 2021-12-29 DEATH — deceased

## 2022-03-25 ENCOUNTER — Encounter: Admit: 2022-03-25 | Discharge: 2022-03-25 | Payer: MEDICARE

## 2022-06-06 ENCOUNTER — Encounter: Admit: 2022-06-06 | Discharge: 2022-06-06 | Payer: MEDICARE

## 2024-06-09 IMAGING — CT ABDOMEN_PELVIS WO(Adult)
2 of 3 series · 13 of 46 positions shown, 15 images · non-contrast
Comparison: none

[Series 2: abdomen_pelvis ax 3.00 br40 s3 · axial · 0.60mm/px · z∈[+1498,+1858]mm · 10 of 138 slices shown, 12 images]
[im 9/138  soft-tissue]
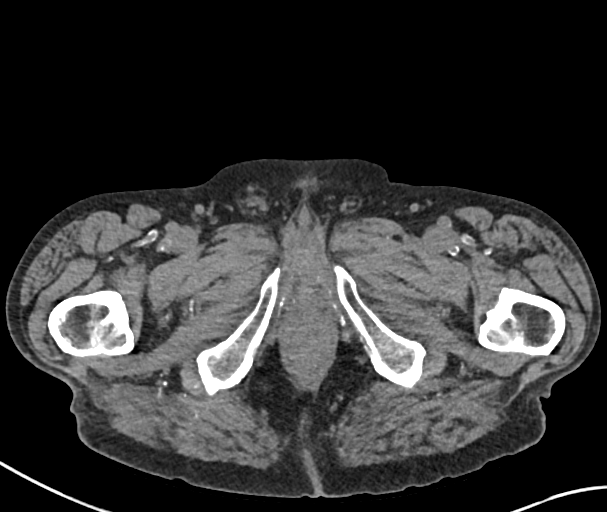
[im 9/138  bone]
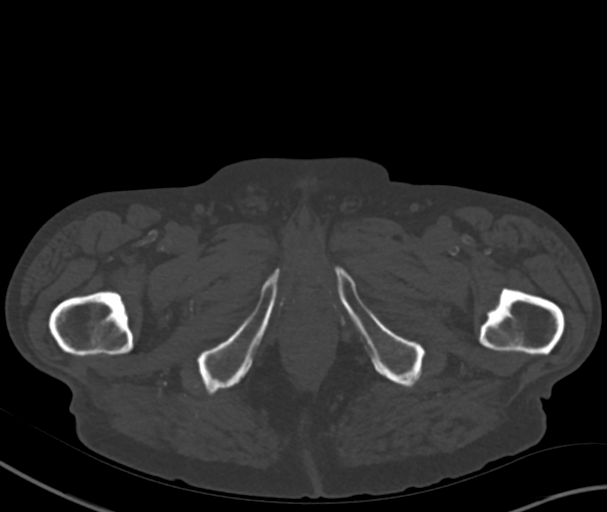
[im 23/138  soft-tissue]
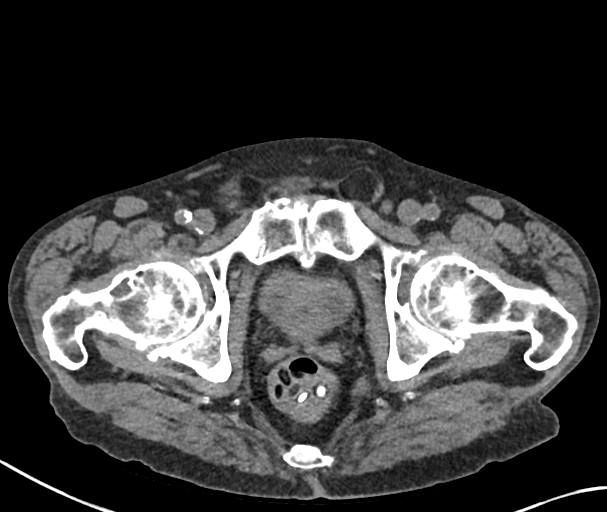
[im 36/138  soft-tissue]
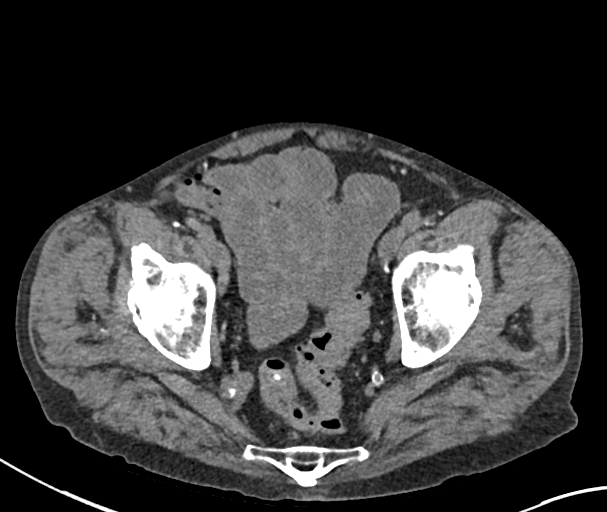
[im 49/138  soft-tissue]
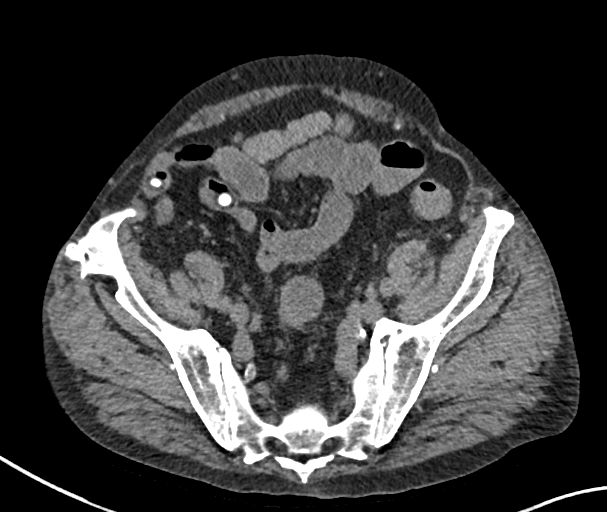
[im 62/138  soft-tissue]
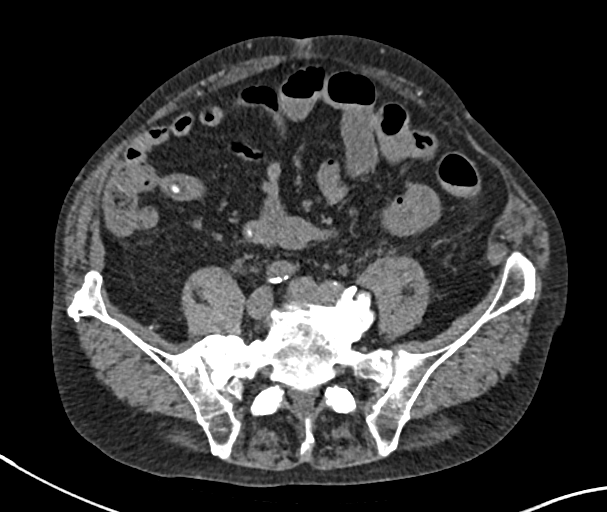
[im 76/138  soft-tissue]
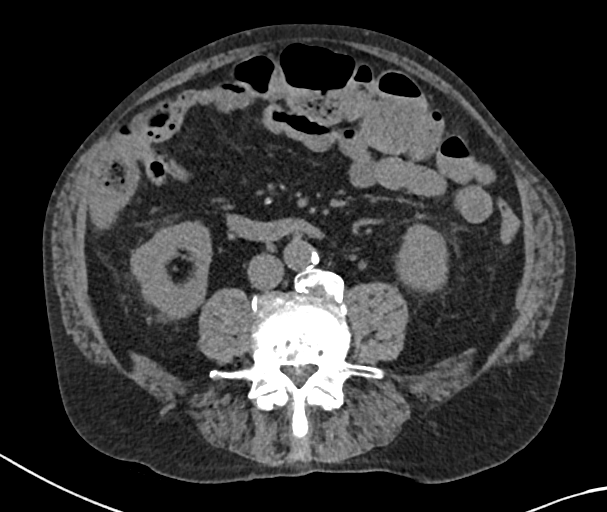
[im 89/138  soft-tissue]
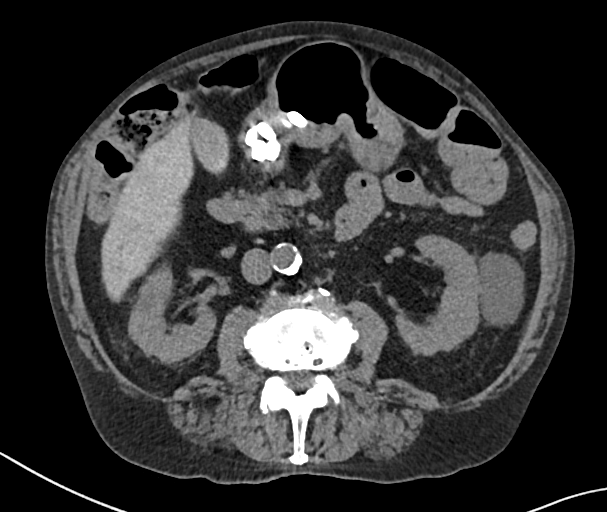
[im 102/138  soft-tissue]
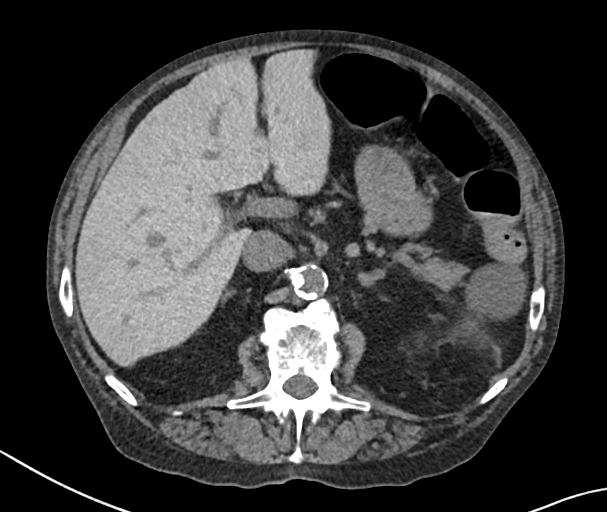
[im 115/138  soft-tissue]
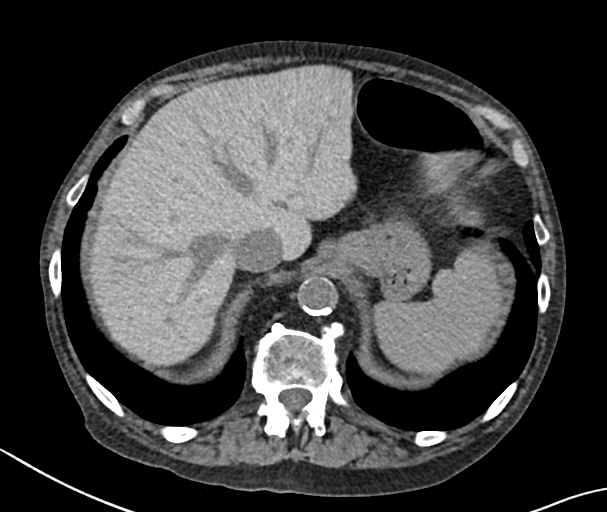
[im 115/138  bone]
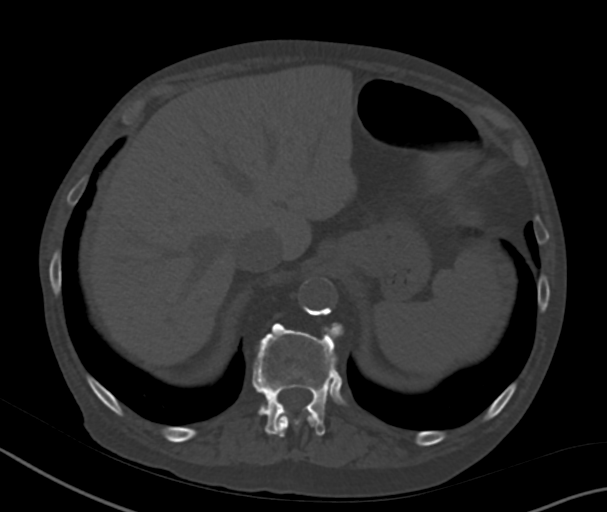
[im 129/138  soft-tissue]
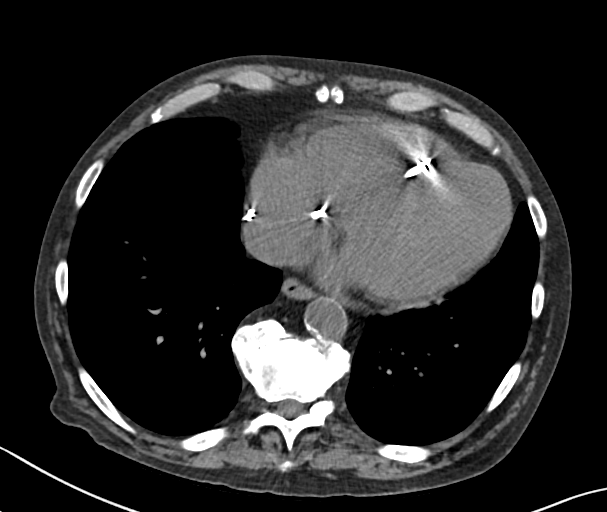

[Series 4: abdomen_pelvis cor 3.00 br40 s3 · coronal · 0.71mm/px · 3 of 102 slices shown]
[im 34/102  soft-tissue]
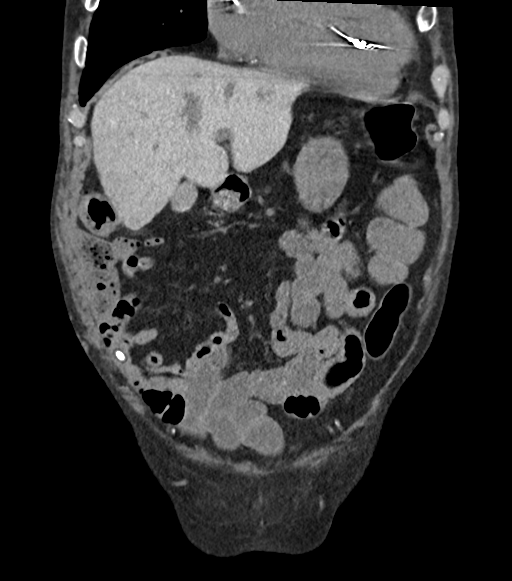
[im 45/102  soft-tissue]
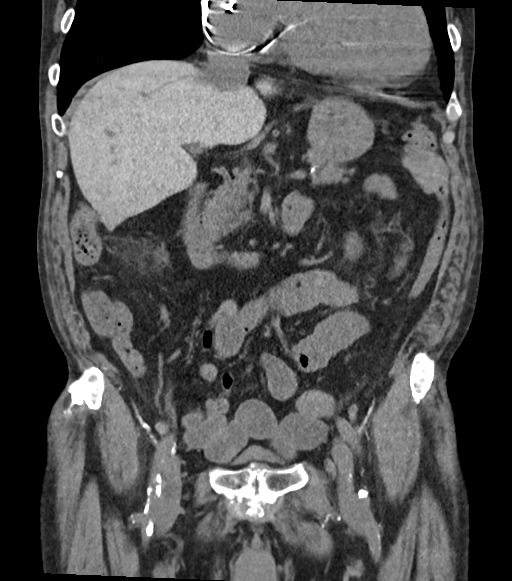
[im 57/102  soft-tissue]
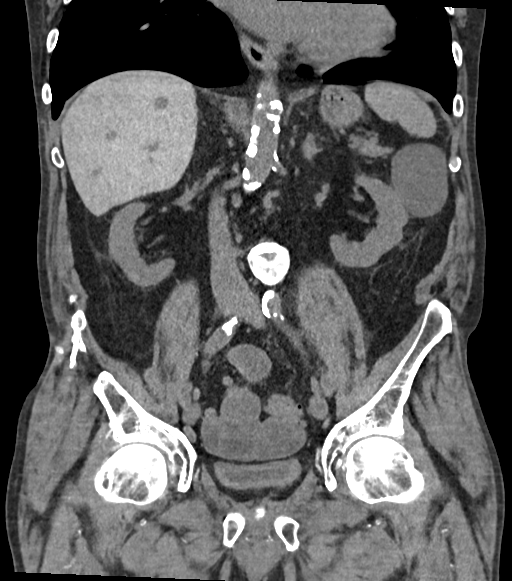

[13 of 46 positions shown; findings below may reference images not displayed]

EXAM

COMPUTED TOMOGRAPHY, ABDOMEN AND PELVIS; WITHOUT CONTRAST MATERIAL CPT 29326

INDICATION

PATIENT STATES HE HAS DIARRHEA X 2 WEEKS WITH ACUTE ABDOMINAL PAIN AND WEIGHT LOSS. CT/NM 0/0. TJ

TECHNIQUE

Multiple contiguous transaxial images were obtained through the abdomen and pelvis without
intravenous contrast material.  Sagittal and coronal images were generated and evaluated as well.
Oral contrast was not utilized.

All CT scans at this facility use dose modulation, iterative reconstruction, and/or weight based
dosing when appropriate to reduce radiation dose to as low as reasonably achievable.

COMPARISONS

There are no previous examinations available for comparison at the time of dictation.

FINDINGS

Lung bases appear clear. No pleural or pericardial effusion. Thoracic spondylosis is noted. Cephalic
artifact from pacer electrodes is noted.

There are multiple tablets identified in the gastric antrum. There is no evidence of a hiatal
hernia.

The liver, spleen, pancreas, gallbladder and adrenal glands are normal in shape and morphology.

There is no evidence of intra or extrahepatic biliary ductal dilatation.  Hyperdense material is
seen along the dependent aspect of the gallbladder.

There is no evidence of hydronephrosis or obstructive uropathy. There is no evidence of
nephrolithiasis or ureterolithiasis. There is a cyst along the interpolar aspect of the left kidney
laterally measuring approximately 4.7 x 3.9 cm. There is also a hyperdense cyst in the posterior
hilar lip of the left kidney measuring 10 x 10 mm.

There is no evidence of epigastric, periportal or mesenteric adenopathy. There is calcified plaque
in the aorta and its bifurcation. The inferior vena cava appears normal in caliber and contour.

There is no evidence of retroperitoneal or pelvic sidewall lymphadenopathy.

Visualized loops of small and large bowel are normal in caliber and contour. The appendix is not
identified. Several tablets are identified in the small large bowel.

There are no acute inflammatory changes to suggest appendicitis or diverticulitis.

There is no evidence of retroperitoneal, inguinal or pelvic sidewall adenopathy.  There is no
evidence of free fluid or free intraperitoneal air.

The urinary bladder is normal in size and shape. The prostate gland and seminal vesicles are within
normal limits.

Left-sided fat containing inguinal hernia is noted. The bony structures are adequately mineralized.
There is multilevel lumbar spondylosis. There are no blastic or lytic lesions.

IMPRESSION

1. There is no evidence of acute intra-abdominal or pelvic pathology. Several tablets are identified
in the gastric antrum and scattered throughout the small and large bowel.

2. There is no evidence of free fluid or free air. There is no evidence of abnormal bowel
dilatation. The appendix was not identified.

Tech Notes:

PT STATES HAS DIARRHEA X 2 WKS. CT/NM 0/0. TJ
